# Patient Record
Sex: Male | Born: 1950 | Race: White | Hispanic: No | Smoking: Never smoker
Health system: Southern US, Community
[De-identification: ages and names within clinical notes are randomized; demographics above are authoritative.]

## PROBLEM LIST (undated history)

## (undated) DIAGNOSIS — H269 Unspecified cataract: Secondary | ICD-10-CM

## (undated) DIAGNOSIS — R569 Unspecified convulsions: Secondary | ICD-10-CM

## (undated) DIAGNOSIS — I10 Essential (primary) hypertension: Secondary | ICD-10-CM

## (undated) DIAGNOSIS — I4891 Unspecified atrial fibrillation: Secondary | ICD-10-CM

## (undated) DIAGNOSIS — E119 Type 2 diabetes mellitus without complications: Secondary | ICD-10-CM

## (undated) HISTORY — PX: CATARACT EXTRACTION: SUR2

## (undated) HISTORY — DX: Unspecified convulsions: R56.9

## (undated) HISTORY — PX: VARICOSE VEIN SURGERY: SHX832

## (undated) HISTORY — PX: TONSILLECTOMY: SUR1361

---

## 2012-10-13 ENCOUNTER — Encounter (HOSPITAL_BASED_OUTPATIENT_CLINIC_OR_DEPARTMENT_OTHER): Payer: Self-pay | Admitting: *Deleted

## 2012-10-13 ENCOUNTER — Emergency Department (HOSPITAL_BASED_OUTPATIENT_CLINIC_OR_DEPARTMENT_OTHER)
Admission: EM | Admit: 2012-10-13 | Discharge: 2012-10-13 | Disposition: A | Discharge status: Home / Self Care | Payer: BC Managed Care – PPO | Attending: Emergency Medicine | Admitting: Emergency Medicine

## 2012-10-13 ENCOUNTER — Emergency Department (HOSPITAL_BASED_OUTPATIENT_CLINIC_OR_DEPARTMENT_OTHER): Payer: BC Managed Care – PPO

## 2012-10-13 DIAGNOSIS — I10 Essential (primary) hypertension: Secondary | ICD-10-CM | POA: Insufficient documentation

## 2012-10-13 DIAGNOSIS — R51 Headache: Secondary | ICD-10-CM | POA: Insufficient documentation

## 2012-10-13 DIAGNOSIS — Z8669 Personal history of other diseases of the nervous system and sense organs: Secondary | ICD-10-CM | POA: Insufficient documentation

## 2012-10-13 DIAGNOSIS — R531 Weakness: Secondary | ICD-10-CM

## 2012-10-13 DIAGNOSIS — R42 Dizziness and giddiness: Secondary | ICD-10-CM | POA: Insufficient documentation

## 2012-10-13 DIAGNOSIS — R110 Nausea: Secondary | ICD-10-CM | POA: Insufficient documentation

## 2012-10-13 DIAGNOSIS — Z87891 Personal history of nicotine dependence: Secondary | ICD-10-CM | POA: Insufficient documentation

## 2012-10-13 DIAGNOSIS — R5383 Other fatigue: Secondary | ICD-10-CM | POA: Insufficient documentation

## 2012-10-13 DIAGNOSIS — R5381 Other malaise: Secondary | ICD-10-CM | POA: Insufficient documentation

## 2012-10-13 HISTORY — DX: Essential (primary) hypertension: I10

## 2012-10-13 HISTORY — DX: Unspecified cataract: H26.9

## 2012-10-13 LAB — COMPREHENSIVE METABOLIC PANEL
ALT: 26 U/L (ref 0–53)
AST: 23 U/L (ref 0–37)
Albumin: 3.9 g/dL (ref 3.5–5.2)
Alkaline Phosphatase: 80 U/L (ref 39–117)
Chloride: 101 mEq/L (ref 96–112)
Potassium: 3.9 mEq/L (ref 3.5–5.1)
Sodium: 136 mEq/L (ref 135–145)
Total Bilirubin: 0.3 mg/dL (ref 0.3–1.2)
Total Protein: 6.7 g/dL (ref 6.0–8.3)

## 2012-10-13 LAB — POCT I-STAT 3, ART BLOOD GAS (G3+)
Bicarbonate: 20.3 mEq/L (ref 20.0–24.0)
O2 Saturation: 98 %
TCO2: 21 mmol/L (ref 0–100)
pCO2 arterial: 33 mmHg — ABNORMAL LOW (ref 35.0–45.0)
pH, Arterial: 7.397 (ref 7.350–7.450)
pO2, Arterial: 95 mmHg (ref 80.0–100.0)

## 2012-10-13 LAB — CBC WITH DIFFERENTIAL/PLATELET
Basophils Absolute: 0 10*3/uL (ref 0.0–0.1)
Basophils Relative: 0 % (ref 0–1)
HCT: 46.9 % (ref 39.0–52.0)
Hemoglobin: 16.3 g/dL (ref 13.0–17.0)
Lymphocytes Relative: 8 % — ABNORMAL LOW (ref 12–46)
MCHC: 34.8 g/dL (ref 30.0–36.0)
Neutro Abs: 6.5 10*3/uL (ref 1.7–7.7)
Neutrophils Relative %: 85 % — ABNORMAL HIGH (ref 43–77)
RDW: 12.5 % (ref 11.5–15.5)
WBC: 7.7 10*3/uL (ref 4.0–10.5)

## 2012-10-13 LAB — GLUCOSE, CAPILLARY: Glucose-Capillary: 193 mg/dL — ABNORMAL HIGH (ref 70–99)

## 2012-10-13 LAB — SALICYLATE LEVEL: Salicylate Lvl: <2 mg/dL — ABNORMAL LOW (ref 2.8–20.0)

## 2012-10-13 MED ORDER — ONDANSETRON HCL 4 MG/2ML IJ SOLN
4.0000 mg | Freq: Once | INTRAMUSCULAR | Status: AC
  Administered 2012-10-13: 4 mg via INTRAVENOUS
  Filled 2012-10-13: qty 2

## 2012-10-13 MED ORDER — HYDROMORPHONE HCL PF 1 MG/ML IJ SOLN
1.0000 mg | Freq: Once | INTRAMUSCULAR | Status: AC
  Administered 2012-10-13: 1 mg via INTRAVENOUS
  Filled 2012-10-13: qty 1

## 2012-10-13 NOTE — ED Notes (Signed)
Pt stood with rapid movement to stand at [facility] to uriante-NAD-advised pt that his ability to stand w/o difficulty has improved since arrival

## 2012-10-13 NOTE — ED Provider Notes (Signed)
Medical screening examination/treatment/procedure(s) were conducted as a shared visit with non-physician practitioner(s) and myself.  I personally evaluated the patient during the encounter  Episode of lightheadedness, nausea, gradual onset headache and weakness. Remembers laying down was slow to respond and had blood in mouth. Back to baseline now. EKG nsr.  nonfocal neuro exam. Suspect syncope, possible seizure. 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 Lactate normal, no anion gap. Doubt related.  [place] syncope rules negative. CN 2-12 intact, no ataxia on finger to nose, no nystagmus, 5/5 strength throughout, no pronator drift, Romberg negative, normal gait.   Glynn Octave, MD 10/13/12 [phone]

## 2012-10-13 NOTE — ED Provider Notes (Signed)
CSN: [id number]     Arrival date & time 10/13/12  1201 History     First MD Initiated Contact with Patient 10/13/12 1243     Chief Complaint  Patient presents with  . Weakness   (Consider location/radiation/quality/duration/timing/severity/associated sxs/prior Treatment) Patient is a 62 y.o. male presenting with weakness. The history is provided by the patient. No language interpreter was used.  Weakness This is a new problem. The current episode started today. The problem occurs constantly. The problem has been unchanged. Associated symptoms include headaches, nausea and weakness. Pertinent negatives include no abdominal pain. Nothing aggravates the symptoms. He has tried nothing for the symptoms. The treatment provided no relief.   Pt reports feeling weak and dizzy today.  Pt complains of a headache.   Pt reports some chest soreness.   Pt's friend reports she called to check on him today and he did not answer. Pt reports he had a similar episode 1 year ago Past Medical History  Diagnosis Date  . Hypertension   . Cataract    Past Surgical History  Procedure Laterality Date  . Cataract extraction    . Varicose vein surgery     No family history on file. History  Substance Use Topics  . Smoking status: Former Games developer  . Smokeless tobacco: Never Used  . Alcohol Use: Yes     Comment: occassionally    Review of Systems  Gastrointestinal: Positive for nausea. Negative for abdominal pain.  Neurological: Positive for weakness and headaches.  All other systems reviewed and are negative.    Allergies  Review of patient's allergies indicates no known allergies.  Home Medications  No current outpatient prescriptions on file. BP 151/102  Temp(Src) 98.4 F (36.9 C) (Oral)  Resp 20  Ht 6\' 3"  (1.905 m)  Wt 225 lb (102.059 kg)  BMI 28.12 kg/m2  SpO2 97% Physical Exam  Nursing note and vitals reviewed. Constitutional: He appears well-developed and well-nourished.  HENT:   Head: Normocephalic and atraumatic.  Right Ear: External ear normal.  Left Ear: External ear normal.  Mouth/Throat: Oropharynx is clear and moist.  Eyes: Pupils are equal, round, and reactive to light.  Neck: Normal range of motion.  Cardiovascular: Normal rate and normal heart sounds.   Pulmonary/Chest: Effort normal and breath sounds normal.  Abdominal: Soft.  Musculoskeletal: Normal range of motion.  Neurological: He is alert. He has normal reflexes.  Skin: Skin is warm.  Psychiatric: He has a normal mood and affect.    ED Course   Procedures (including critical care time)  Labs Reviewed  GLUCOSE, CAPILLARY - Abnormal; Notable for the following:    Glucose-Capillary 193 (*)    All other components within normal limits   No results found. 1. Weakness     MDM  Pt does tintype photography,  (uses potasium cyanide)   Dr. Manus Gunning in to see and examine.  I doubt this is related to cyanide.   Pt feels better after fluids and pain medications.   Pt given primary care referrals for follow up as he is new to the ares  Date: 10/13/2012  Rate: 79  Rhythm: normal sinus rhythm  QRS Axis: normal  Intervals: normal  ST/T Wave abnormalities: normal  Conduction Disutrbances:none  Narrative Interpretation:   Old EKG Reviewed: none available Results for orders placed during the hospital encounter of 10/13/12  GLUCOSE, CAPILLARY      Result Value Range   Glucose-Capillary 193 (*) 70 - 99 mg/dL  CBC WITH DIFFERENTIAL  Result Value Range   WBC 7.7  4.0 - 10.5 K/uL   RBC 5.12  4.22 - 5.81 MIL/uL   Hemoglobin 16.3  13.0 - 17.0 g/dL   HCT 16.1  09.6 - 04.5 %   MCV 91.6  78.0 - 100.0 fL   MCH 31.8  26.0 - 34.0 pg   MCHC 34.8  30.0 - 36.0 g/dL   RDW 40.9  81.1 - 91.4 %   Platelets 141 (*) 150 - 400 K/uL   Neutrophils Relative % 85 (*) 43 - 77 %   Neutro Abs 6.5  1.7 - 7.7 K/uL   Lymphocytes Relative 8 (*) 12 - 46 %   Lymphs Abs 0.6 (*) 0.7 - 4.0 K/uL   Monocytes Relative 6  3 -  12 %   Monocytes Absolute 0.5  0.1 - 1.0 K/uL   Eosinophils Relative 0  0 - 5 %   Eosinophils Absolute 0.0  0.0 - 0.7 K/uL   Basophils Relative 0  0 - 1 %   Basophils Absolute 0.0  0.0 - 0.1 K/uL  TROPONIN I      Result Value Range   Troponin I <0.30  <0.30 ng/mL  COMPREHENSIVE METABOLIC PANEL      Result Value Range   Sodium 136  135 - 145 mEq/L   Potassium 3.9  3.5 - 5.1 mEq/L   Chloride 101  96 - 112 mEq/L   CO2 23  19 - 32 mEq/L   Glucose, Bld 171 (*) 70 - 99 mg/dL   BUN 22  6 - 23 mg/dL   Creatinine, Ser 7.82  0.50 - 1.35 mg/dL   Calcium 9.0  8.4 - 95.6 mg/dL   Total Protein 6.7  6.0 - 8.3 g/dL   Albumin 3.9  3.5 - 5.2 g/dL   AST 23  0 - 37 U/L   ALT 26  0 - 53 U/L   Alkaline Phosphatase 80  39 - 117 U/L   Total Bilirubin 0.3  0.3 - 1.2 mg/dL   GFR calc non Af Amer >90  >90 mL/min   GFR calc Af Amer >90  >90 mL/min  SALICYLATE LEVEL      Result Value Range   Salicylate Lvl <2.0 (*) 2.8 - 20.0 mg/dL  ACETAMINOPHEN LEVEL      Result Value Range   Acetaminophen (Tylenol), Serum <15.0  10 - 30 ug/mL  CG4 I-STAT (LACTIC ACID)      Result Value Range   Lactic Acid, Venous 1.98  0.5 - 2.2 mmol/L  POCT I-STAT 3, BLOOD GAS (G3+)      Result Value Range   pH, Arterial 7.397  7.350 - 7.450   pCO2 arterial 33.0 (*) 35.0 - 45.0 mmHg   pO2, Arterial 95.0  80.0 - 100.0 mmHg   Bicarbonate 20.3  20.0 - 24.0 mEq/L   TCO2 21  0 - 100 mmol/L   O2 Saturation 98.0     Acid-base deficit 4.0 (*) 0.0 - 2.0 mmol/L   Patient temperature 98.4 F     Collection site RADIAL, ALLEN'S TEST ACCEPTABLE     Drawn by RT     Sample type ARTERIAL     Dg Chest 2 View  10/13/2012   *RADIOLOGY REPORT*  Clinical Data: Weakness  CHEST - 2 VIEW  Comparison: None  Findings: Heart size is upper normal.  Negative for heart failure. Lungs are clear without  infiltrate effusion or mass.  IMPRESSION: No active cardiopulmonary abnormality.  Original Report Authenticated By: Janeece Riggers, M.D.   Ct Head Wo  Contrast  10/13/2012   *RADIOLOGY REPORT*  Clinical Data: Generalized weakness  CT HEAD WITHOUT CONTRAST  Technique:  Contiguous axial images were obtained from the base of the skull through the vertex without contrast.  Comparison: None  Findings: Mild atrophy.  Negative for acute infarct.  Negative for hemorrhage or mass lesion.  Calvarium intact.  IMPRESSION: No acute abnormality.   Original Report Authenticated By: Janeece Riggers, M.D.     Lonia Skinner [place], PA-C 10/13/12 1602  Elson Areas, PA-C 10/13/12 1605

## 2012-10-13 NOTE — ED Notes (Signed)
ED PA at bedside

## 2012-10-13 NOTE — ED Notes (Signed)
Patient states at approximately 0930 he felt dizzy and weakness and faint.  States he feels nausea at this time.  States he has had a headache for one week.  Past hx of HTN without meds per his PCP.  C/O soreness in his center chest.

## 2012-10-13 NOTE — ED Notes (Signed)
Arterial Blood Gas obtained Right Radial x1 attempt. Bleeding stopped.

## 2012-11-03 ENCOUNTER — Emergency Department (HOSPITAL_COMMUNITY): Payer: BC Managed Care – PPO

## 2012-11-03 ENCOUNTER — Observation Stay (HOSPITAL_COMMUNITY)
Admission: EM | Admit: 2012-11-03 | Discharge: 2012-11-05 | Disposition: A | Discharge status: Home / Self Care | Payer: BC Managed Care – PPO | Attending: Internal Medicine | Admitting: Internal Medicine

## 2012-11-03 ENCOUNTER — Encounter (HOSPITAL_COMMUNITY): Payer: Self-pay | Admitting: Emergency Medicine

## 2012-11-03 DIAGNOSIS — I10 Essential (primary) hypertension: Secondary | ICD-10-CM | POA: Insufficient documentation

## 2012-11-03 DIAGNOSIS — R42 Dizziness and giddiness: Principal | ICD-10-CM | POA: Diagnosis present

## 2012-11-03 DIAGNOSIS — H269 Unspecified cataract: Secondary | ICD-10-CM | POA: Insufficient documentation

## 2012-11-03 DIAGNOSIS — Z9849 Cataract extraction status, unspecified eye: Secondary | ICD-10-CM | POA: Insufficient documentation

## 2012-11-03 DIAGNOSIS — R55 Syncope and collapse: Secondary | ICD-10-CM | POA: Insufficient documentation

## 2012-11-03 DIAGNOSIS — F411 Generalized anxiety disorder: Secondary | ICD-10-CM | POA: Insufficient documentation

## 2012-11-03 DIAGNOSIS — R9401 Abnormal electroencephalogram [EEG]: Secondary | ICD-10-CM

## 2012-11-03 DIAGNOSIS — D696 Thrombocytopenia, unspecified: Secondary | ICD-10-CM | POA: Insufficient documentation

## 2012-11-03 DIAGNOSIS — F419 Anxiety disorder, unspecified: Secondary | ICD-10-CM | POA: Diagnosis present

## 2012-11-03 LAB — COMPREHENSIVE METABOLIC PANEL
ALT: 27 U/L (ref 0–53)
Alkaline Phosphatase: 75 U/L (ref 39–117)
Chloride: 106 mEq/L (ref 96–112)
GFR calc Af Amer: >90 mL/min (ref >90)
Glucose, Bld: 105 mg/dL — ABNORMAL HIGH (ref 70–99)
Potassium: 4.7 mEq/L (ref 3.5–5.1)
Sodium: 140 mEq/L (ref 135–145)
Total Bilirubin: 0.3 mg/dL (ref 0.3–1.2)
Total Protein: 6.7 g/dL (ref 6.0–8.3)

## 2012-11-03 LAB — POCT I-STAT TROPONIN I

## 2012-11-03 LAB — CBC WITH DIFFERENTIAL/PLATELET
Basophils Relative: 0 % (ref 0–1)
Hemoglobin: 16.3 g/dL (ref 13.0–17.0)
Lymphs Abs: 1.4 10*3/uL (ref 0.7–4.0)
MCHC: 36.3 g/dL — ABNORMAL HIGH (ref 30.0–36.0)
Monocytes Relative: 9 % (ref 3–12)
Neutro Abs: 4.1 10*3/uL (ref 1.7–7.7)
Neutrophils Relative %: 65 % (ref 43–77)
RBC: 4.96 MIL/uL (ref 4.22–5.81)

## 2012-11-03 LAB — TROPONIN I: Troponin I: <0.3 ng/mL (ref <0.30)

## 2012-11-03 MED ORDER — POLYETHYLENE GLYCOL 3350 17 G PO PACK
17.0000 g | PACK | Freq: Every day | ORAL | Status: DC | PRN
  Filled 2012-11-03: qty 1

## 2012-11-03 MED ORDER — HYDROCODONE-ACETAMINOPHEN 5-325 MG PO TABS: 1.0000 | ORAL_TABLET | ORAL | Status: DC | PRN

## 2012-11-03 MED ORDER — SODIUM CHLORIDE 0.9 % IJ SOLN
3.0000 mL | Freq: Two times a day (BID) | INTRAMUSCULAR | Status: DC
  Administered 2012-11-03 – 2012-11-04 (×3): 3 mL via INTRAVENOUS

## 2012-11-03 MED ORDER — ASPIRIN 81 MG PO CHEW
81.0000 mg | CHEWABLE_TABLET | Freq: Every day | ORAL | Status: DC
  Administered 2012-11-03 – 2012-11-04 (×2): 81 mg via ORAL
  Filled 2012-11-03 (×2): qty 1

## 2012-11-03 MED ORDER — ASPIRIN 81 MG PO CHEW
324.0000 mg | CHEWABLE_TABLET | Freq: Once | ORAL | Status: AC
  Administered 2012-11-03: 324 mg via ORAL
  Filled 2012-11-03: qty 4

## 2012-11-03 MED ORDER — ADULT MULTIVITAMIN W/MINERALS CH
1.0000 | ORAL_TABLET | Freq: Every day | ORAL | Status: DC
  Administered 2012-11-03 – 2012-11-04 (×2): 1 via ORAL
  Filled 2012-11-03 (×2): qty 1

## 2012-11-03 MED ORDER — GUAIFENESIN-DM 100-10 MG/5ML PO SYRP: 5.0000 mL | ORAL_SOLUTION | ORAL | Status: DC | PRN

## 2012-11-03 MED ORDER — SODIUM CHLORIDE 0.9 % IV SOLN
INTRAVENOUS | Status: AC
  Administered 2012-11-03 – 2012-11-04 (×2): via INTRAVENOUS

## 2012-11-03 MED ORDER — LORAZEPAM 0.5 MG PO TABS: 0.5000 mg | ORAL_TABLET | Freq: Two times a day (BID) | ORAL | Status: DC | PRN

## 2012-11-03 MED ORDER — ALBUTEROL SULFATE (5 MG/ML) 0.5% IN NEBU: 2.5000 mg | INHALATION_SOLUTION | RESPIRATORY_TRACT | Status: DC | PRN

## 2012-11-03 MED ORDER — ONDANSETRON HCL 4 MG PO TABS: 4.0000 mg | ORAL_TABLET | Freq: Four times a day (QID) | ORAL | Status: DC | PRN

## 2012-11-03 MED ORDER — HEPARIN SODIUM (PORCINE) 5000 UNIT/ML IJ SOLN
5000.0000 [IU] | Freq: Three times a day (TID) | INTRAMUSCULAR | Status: DC
  Administered 2012-11-03 – 2012-11-04 (×3): 5000 [IU] via SUBCUTANEOUS
  Filled 2012-11-03 (×5): qty 1

## 2012-11-03 MED ORDER — ONDANSETRON HCL 4 MG/2ML IJ SOLN: 4.0000 mg | Freq: Four times a day (QID) | INTRAMUSCULAR | Status: DC | PRN

## 2012-11-03 NOTE — Progress Notes (Signed)
Pt arrived to room 4e18 from ED.  Pt alert and oriented, VS wnl, and tele placed.  Order placed for stroke swallow screen.  Called rapid response to perform screen, until then pt NPO.  Will continue to monitor.

## 2012-11-03 NOTE — ED Notes (Signed)
Pt friend Deveron Furlong [phone].  Please call if pt needs anything.

## 2012-11-03 NOTE — ED Notes (Signed)
Anitra Lauth, MD at bedside for evaluation.

## 2012-11-03 NOTE — ED Notes (Addendum)
Was seen at dr yesterday for dizziness x 3 days placed on meclizine and ativan had labs drawn  And increased his bp meds had a bunch of tests last week ekg and cat scan and did not find anything

## 2012-11-03 NOTE — ED Provider Notes (Signed)
CSN: [id number]     Arrival date & time 11/03/12  1312 History   First MD Initiated Contact with Patient 11/03/12 1417     Chief Complaint  Patient presents with  . Dizziness  . Hypertension   (Consider location/radiation/quality/duration/timing/severity/associated sxs/prior Treatment) HPI Comments: Patient states for the last 2-3 weeks he's had persistent and intermittent episodes of being lightheaded. It is not associated with position change, exertion. Nothing he can do seems to make it better or worse. He was seen in the emergency room at the end of August for a similar and had normal labs and vitals at that time. He states since that time he followed up with his PCP who scheduled an appointment for him with cardiology on Monday and also gave him Ativan and meclizine for possible anxiety causing his symptoms. He states his blood pressure is going up and down and is poorly regulated and recently started blood pressure medication. He states he occasionally today will feel some numbness in his left jaw but does not involve the face arm or leg. There is no facial droop, speech difficulty or weakness. No prior history of cardiac disease no prior history of stroke. He denies headache at this time, but today has had some soreness in his left shoulder.    The history is provided by the patient.    Past Medical History  Diagnosis Date  . Hypertension   . Cataract    Past Surgical History  Procedure Laterality Date  . Cataract extraction    . Varicose vein surgery     No family history on file. History  Substance Use Topics  . Smoking status: Former Games developer  . Smokeless tobacco: Never Used  . Alcohol Use: Yes     Comment: occassionally    Review of Systems  Constitutional: Negative for fever.  HENT: Negative for neck pain.   Respiratory: Negative for cough and shortness of breath.   Cardiovascular: Negative for chest pain.  Gastrointestinal: Negative for nausea, vomiting and diarrhea.   Neurological: Positive for light-headedness.  All other systems reviewed and are negative.    Allergies  Review of patient's allergies indicates no known allergies.  Home Medications  No current outpatient prescriptions on file. BP 166/85  Pulse 66  Temp(Src) 97.6 F (36.4 C)  Resp 16  SpO2 95% Physical Exam  Nursing note and vitals reviewed. Constitutional: He is oriented to person, place, and time. He appears well-developed and well-nourished. No distress.  HENT:  Head: Normocephalic and atraumatic.  Mouth/Throat: Oropharynx is clear and moist.  Eyes: Conjunctivae and EOM are normal. Pupils are equal, round, and reactive to light.  Neck: Normal range of motion. Neck supple.  Cardiovascular: Normal rate, regular rhythm and intact distal pulses.   Murmur heard.  Systolic murmur is present with a grade of 2/6  Murmur heard best at left sternal border  Pulmonary/Chest: Effort normal and breath sounds normal. No respiratory distress. He has no wheezes. He has no rales.  Abdominal: Soft. He exhibits no distension. There is no tenderness. There is no rebound and no guarding.  Musculoskeletal: Normal range of motion. He exhibits no edema and no tenderness.  Neurological: He is alert and oriented to person, place, and time.  Skin: Skin is warm and dry. No rash noted. No erythema.  Psychiatric: He has a normal mood and affect. His behavior is normal.    ED Course  Procedures (including critical care time) Labs Review Labs Reviewed  COMPREHENSIVE METABOLIC PANEL - Abnormal;  Notable for the following:    Glucose, Bld 105 (*)    All other components within normal limits  CBC WITH DIFFERENTIAL - Abnormal; Notable for the following:    MCHC 36.3 (*)    Platelets 144 (*)    All other components within normal limits  POCT I-STAT TROPONIN I   Imaging Review Dg Chest 2 View  11/03/2012   CLINICAL DATA:  Dizziness, hypertension.  EXAM: CHEST  2 VIEW  COMPARISON:  10/13/2012   FINDINGS: Heart is normal size. No confluent opacities or effusions. No acute bony abnormality.  IMPRESSION: No acute findings.   Electronically Signed   By: Charlett Nose M.D.   On: 11/03/2012 13:56    Date: 11/03/2012  Rate: 70  Rhythm: normal sinus rhythm  QRS Axis: normal  Intervals: normal  ST/T Wave abnormalities: normal  Conduction Disutrbances:none  Narrative Interpretation:   Old EKG Reviewed: unchanged   MDM  No diagnosis found.  Patient is here complaining of persistence sensation of being lightheaded. He states his blood pressure goes up and down but today it became worse and he felt slight numbness in the left side of his mouth. He denies any speech difficulties or focal weakness. However given c/o of mild pain in the left shoulder and facial numbness concern for possible angina.   Patient had one syncopal episode approximately 2 weeks ago and was evaluated in the emergency room negative blood work, negative head CT and a normal EKG and chest x-ray. Agent denies any chest pain, shortness of breath or prior cardiac history. He states he's been under a large amount of stress lately and saw his physician yesterday who thought his symptoms could be related to anxiety and was given Ativan and meclizine. He did take 1 tablet today and feels better than he did prior to arrival.  Patient is well-appearing and states he has a appointment with cardiology on Monday. He has no signs of orthostasis here and lab work is normal but troponin is borderline and shoulder pain today started around lunch. EKG is within normal limits however on exam he does have a faint 2/6 left sternal border murmur. Possible that patient has aortic stenosis which is causing some of his symptoms however also may be related to anxiety vs angina.  Feel pt warrents cardiac workup prior to d/c due to 3rd visit to medical provider for same thing in the last few weeks without problems before.    Gwyneth Sprout, MD 11/03/12  1525

## 2012-11-03 NOTE — H&P (Signed)
[facility]                                                                                    Patient Demographics  Michael Whitney, is a 62 y.o. male  MRN: [id number]   DOB - [birth year]  Admit Date - 11/03/2012  Outpatient Primary MD for the patient is No primary provider on file.   With History of -  Past Medical History  Diagnosis Date  . Hypertension   . Cataract       Past Surgical History  Procedure Laterality Date  . Cataract extraction    . Varicose vein surgery      in for   Chief Complaint  Patient presents with  . Dizziness  . Hypertension     HPI  Michael Whitney  is a 62 y.o. male,  With past medical history of hypertension, cataract, varicose veins comes to the hospital with chief complaints of dizziness, according to the patient he has had episodes of dizziness about 5-6 times over the last several months, few months ago he had one episode when he lost consciousness for a few seconds, he then came to the ER he had a stable workup and was discharged home, in the last few weeks however he has had more frequent episodes of dizziness which he describes as a sinking feeling, denies any symptoms suggestive of vertigo, no headache, he has noticed that his blood pressure has been fluctuating a little bit of late, today he had an episode of dizziness while he was mowing his lawn, he also experienced some tingling and numbness on the left side of his jaw which actually is still present, no focal weakness, no chest pain or palpitations per se. Came to the ER where he had  stable blood work,Unremarkable CT head and EKG and I was called to admit the patient for workup for dizziness possible underlying CAD or Dysrhythmia.    Review of Systems    In addition to the HPI above,   No Fever-chills, No Headache, No changes with Vision or hearing, No problems swallowing food or Liquids, No Chest pain, Cough or Shortness of Breath, No Abdominal pain, No Nausea or Vommitting, Bowel  movements are regular, No Blood in stool or Urine, No dysuria, No new skin rashes or bruises, No new joints pains-aches,  No new weakness, tingling, numbness in any extremity, except as above (jaw) No recent weight gain or loss, No polyuria, polydypsia or polyphagia, No significant Mental Stressors.  A full 10 point Review of Systems was done, except as stated above, all other Review of Systems were negative.   Social History History  Substance Use Topics  . Smoking status: Former Games developer  . Smokeless tobacco: Never Used  . Alcohol Use: Yes     Comment: occassionally      Family History CDA - mother  Prior to Admission medications   Medication Sig Start Date End Date Taking? Authorizing Provider  lisinopril (PRINIVIL,ZESTRIL) 10 MG tablet Take 10 mg by mouth daily.   Yes Historical Provider, MD  LORazepam (ATIVAN) 0.5 MG tablet Take 0.5 mg by mouth 2 (two) times daily as needed for anxiety.  Yes Historical Provider, MD  meclizine (ANTIVERT) 25 MG tablet Take 25 mg by mouth 3 (three) times daily as needed for dizziness.   Yes Historical Provider, MD  Multiple Vitamin (MULTIVITAMIN WITH MINERALS) TABS tablet Take 1 tablet by mouth daily.   Yes Historical Provider, MD    No Known Allergies  Physical Exam  Vitals  Blood pressure 135/79, pulse 74, temperature 97.6 F (36.4 C), resp. rate 16, SpO2 95.00%.   1. General middle aged white male lying in bed in NAD,     2. Anxious affect and insight, Not Suicidal or Homicidal, Awake Alert, Oriented X 3.  3. No F.N deficits, ALL C.Nerves Intact, Strength 5/5 all 4 extremities, Sensation intact all 4 extremities, Plantars down going.  4. Ears and Eyes appear Normal, Conjunctivae clear, PERRLA. Moist Oral Mucosa.  5. Supple Neck, No JVD, No cervical lymphadenopathy appriciated, No Carotid Bruits.  6. Symmetrical Chest wall movement, Good air movement bilaterally, CTAB.  7. RRR, No Gallops, Rubs or Murmurs, No Parasternal  Heave.  8. Positive Bowel Sounds, Abdomen Soft, Non tender, No organomegaly appriciated,No rebound -guarding or rigidity.  9.  No Cyanosis, Normal Skin Turgor, No Skin Rash or Bruise.  10. Good muscle tone,  joints appear normal , no effusions, Normal ROM.  11. No Palpable Lymph Nodes in Neck or Axillae     Data Review  CBC  Recent Labs Lab 11/03/12 1320  WBC 6.3  HGB 16.3  HCT 44.9  PLT 144*  MCV 90.5  MCH 32.9  MCHC 36.3*  RDW 12.6  LYMPHSABS 1.4  MONOABS 0.6  EOSABS 0.2  BASOSABS 0.0   ------------------------------------------------------------------------------------------------------------------  Chemistries   Recent Labs Lab 11/03/12 1320  NA 140  K 4.7  CL 106  CO2 25  GLUCOSE 105*  BUN 19  CREATININE 0.70  CALCIUM 9.4  AST 23  ALT 27  ALKPHOS 75  BILITOT 0.3   ------------------------------------------------------------------------------------------------------------------ CrCl is unknown because both a height and weight (above a minimum accepted value) are required for this calculation. ------------------------------------------------------------------------------------------------------------------ No results found for this basename: TSH, T4TOTAL, FREET3, T3FREE, THYROIDAB,  in the last 72 hours   Coagulation profile No results found for this basename: INR, PROTIME,  in the last 168 hours ------------------------------------------------------------------------------------------------------------------- No results found for this basename: DDIMER,  in the last 72 hours -------------------------------------------------------------------------------------------------------------------  Cardiac Enzymes No results found for this basename: CK, CKMB, TROPONINI, MYOGLOBIN,  in the last 168 hours ------------------------------------------------------------------------------------------------------------------ No components found with this basename:  POCBNP,    ---------------------------------------------------------------------------------------------------------------  Urinalysis No results found for this basename: colorurine, appearanceur, labspec, phurine, glucoseu, hgbur, bilirubinur, ketonesur, proteinur, urobilinogen, nitrite, leukocytesur    ----------------------------------------------------------------------------------------------------------------  Imaging results:   Dg Chest 2 View  11/03/2012   CLINICAL DATA:  Dizziness, hypertension.  EXAM: CHEST  2 VIEW  COMPARISON:  10/13/2012  FINDINGS: Heart is normal size. No confluent opacities or effusions. No acute bony abnormality.  IMPRESSION: No acute findings.   Electronically Signed   By: Charlett Nose M.D.   On: 11/03/2012 13:56   Dg Chest 2 View  10/13/2012   *RADIOLOGY REPORT*  Clinical Data: Weakness  CHEST - 2 VIEW  Comparison: None  Findings: Heart size is upper normal.  Negative for heart failure. Lungs are clear without  infiltrate effusion or mass.  IMPRESSION: No active cardiopulmonary abnormality.   Original Report Authenticated By: Janeece Riggers, M.D.   Ct Head Wo Contrast  10/13/2012   *RADIOLOGY REPORT*  Clinical Data: Generalized weakness  CT  HEAD WITHOUT CONTRAST  Technique:  Contiguous axial images were obtained from the base of the skull through the vertex without contrast.  Comparison: None  Findings: Mild atrophy.  Negative for acute infarct.  Negative for hemorrhage or mass lesion.  Calvarium intact.  IMPRESSION: No acute abnormality.   Original Report Authenticated By: Janeece Riggers, M.D.    My personal review of EKG: Rhythm NSR, Rate  70 /min,  no Acute ST changes    Assessment & Plan  1.Dizziness with left-sided jaw numbness. Patient was somewhat orthostatic in the ER, however he says that he has been consuming large amounts of liquid over the last several weeks, his initial EKG and head CT are unremarkable. At this point I will keep him for 23 hour  observation under telemetry bed, will cycle his troponins, obtain an echogram, will get cardiology input as patient might require further testing for reversible ischemia along with possible prolonged rhythm monitoring for persistent syncopal episodes. For his left-sided jaw tingling workup as above however will also check A1c, lipid panel and MRI of the brain. For now he'll be placed on low-dose aspirin.   2. Mild orthostatic hypotension. Supine blood pressures are stable. He does have history of varicose veins in this could be contributing to his orthostasis along with his home ACE inhibitor, will discontinue ACE inhibitor, gentle IV fluids for hydration and apply TED stockings .   3. Anxiety. Patient will be placed on Klonopin, thereafter outpatient followup with PCP and psychiatry if needed. He's not suicidal homicidal.   DVT Prophylaxis Heparin   AM Labs Ordered, also please review Full Orders  Family Communication: Admission, patients condition and plan of care including tests being ordered have been discussed with the patient and friend who indicate understanding and agree with the plan and Code Status.  Code Status full  Likely DC to  home  Condition fair  Time spent in minutes : 35    Susa Raring K M.D on 11/03/2012 at 3:52 PM  Between 7am to 7pm - Pager - [phone]  After 7pm go to [email or web address] - password [email or web address]  And look for the night coverage person covering me after hours  [facility] Office  [phone]

## 2012-11-03 NOTE — Consult Note (Signed)
HPI: 61 year old male for evaluation of syncopal episode. No prior cardiac history. He typically does not have dyspnea on exertion, orthopnea, PND, pedal edema, palpitations or exertional chest pain. 2 weeks ago he worked outside mowing his yard and trimming. He states it was extremely hot. He went inside and was talking on the phone. He then apparently had a syncopal episode. He does not recall any events leading up to the episode. He denies chest pain, nausea, dyspnea. No incontinence or tongue biting. He awoke when the person who was talking to him was knocking on his door. He was unconscious for 20-30 minutes. He was seen in emergency room and a head CT was negative. Electrocardiogram showed sinus rhythm with no ST changes. Patient did well until 3 days ago when he began experiencing a sensation of dizziness. This was not positional. There is no associated chest pain, palpitations. It was continuous. Today he had an episode of numbness in his left jaw and a sharp pain in his left shoulder. He therefore presented to the emergency room and was admitted. Cardiology is asked to evaluate.  Medications Prior to Admission  Medication Sig Dispense Refill  . lisinopril (PRINIVIL,ZESTRIL) 10 MG tablet Take 10 mg by mouth daily.      Marland Kitchen LORazepam (ATIVAN) 0.5 MG tablet Take 0.5 mg by mouth 2 (two) times daily as needed for anxiety.      . meclizine (ANTIVERT) 25 MG tablet Take 25 mg by mouth 3 (three) times daily as needed for dizziness.      . Multiple Vitamin (MULTIVITAMIN WITH MINERALS) TABS tablet Take 1 tablet by mouth daily.        No Known Allergies  Past Medical History  Diagnosis Date  . Hypertension   . Cataract     Past Surgical History  Procedure Laterality Date  . Cataract extraction    . Varicose vein surgery    . Tonsillectomy      History   Social History  . Marital Status: Single    Spouse Name: N/A    Number of Children: 2  . Years of Education: N/A   Occupational History   .      Photographer   Social History Main Topics  . Smoking status: Former Games developer  . Smokeless tobacco: Never Used  . Alcohol Use: Yes     Comment: occasionally  . Drug Use: No  . Sexual Activity: Not on file   Other Topics Concern  . Not on file   Social History Narrative  . No narrative on file    Family History  Problem Relation Age of Onset  . CAD Mother     MI at age 39    ROS:  no fevers or chills, productive cough, hemoptysis, dysphasia, odynophagia, melena, hematochezia, dysuria, hematuria, rash, seizure activity, orthopnea, PND, pedal edema, claudication. Remaining systems are negative.  Physical Exam:   Blood pressure 147/77, pulse 66, temperature 99.1 F (37.3 C), temperature source Oral, resp. rate 16, height 6\' 3"  (1.905 m), weight 228 lb 6.3 oz (103.6 kg), SpO2 99.00%.  General:  Well developed/well nourished in NAD Skin warm/dry Patient not depressed No peripheral clubbing Back-normal HEENT-normal/normal eyelids Neck supple/normal carotid upstroke bilaterally; no bruits; no JVD; no thyromegaly chest - CTA/ normal expansion CV - RRR/normal S1 and S2; no murmurs or gallops;  PMI nondisplaced, + S4 Abdomen -NT/ND, no HSM, no mass, + bowel sounds, no bruit 2+ femoral pulses, no bruits Ext-no edema, chords, 2+ DP Neuro-grossly nonfocal  ECG sinus rhythm with first degree AV block.  Results for orders placed during the hospital encounter of 11/03/12 (from the past 48 hour(s))  COMPREHENSIVE METABOLIC PANEL     Status: Abnormal   Collection Time    11/03/12  1:20 PM      Result Value Range   Sodium 140  135 - 145 mEq/L   Potassium 4.7  3.5 - 5.1 mEq/L   Chloride 106  96 - 112 mEq/L   CO2 25  19 - 32 mEq/L   Glucose, Bld 105 (*) 70 - 99 mg/dL   BUN 19  6 - 23 mg/dL   Creatinine, Ser 7.82  0.50 - 1.35 mg/dL   Calcium 9.4  8.4 - 95.6 mg/dL   Total Protein 6.7  6.0 - 8.3 g/dL   Albumin 3.8  3.5 - 5.2 g/dL   AST 23  0 - 37 U/L   ALT 27  0 - 53 U/L     Alkaline Phosphatase 75  39 - 117 U/L   Total Bilirubin 0.3  0.3 - 1.2 mg/dL   GFR calc non Af Amer >90  >90 mL/min   GFR calc Af Amer >90  >90 mL/min   Comment: (NOTE)     The eGFR has been calculated using the CKD EPI equation.     This calculation has not been validated in all clinical situations.     eGFR's persistently <90 mL/min signify possible Chronic Kidney     Disease.  CBC WITH DIFFERENTIAL     Status: Abnormal   Collection Time    11/03/12  1:20 PM      Result Value Range   WBC 6.3  4.0 - 10.5 K/uL   RBC 4.96  4.22 - 5.81 MIL/uL   Hemoglobin 16.3  13.0 - 17.0 g/dL   HCT 21.3  08.6 - 57.8 %   MCV 90.5  78.0 - 100.0 fL   MCH 32.9  26.0 - 34.0 pg   MCHC 36.3 (*) 30.0 - 36.0 g/dL   RDW 46.9  62.9 - 52.8 %   Platelets 144 (*) 150 - 400 K/uL   Neutrophils Relative % 65  43 - 77 %   Neutro Abs 4.1  1.7 - 7.7 K/uL   Lymphocytes Relative 22  12 - 46 %   Lymphs Abs 1.4  0.7 - 4.0 K/uL   Monocytes Relative 9  3 - 12 %   Monocytes Absolute 0.6  0.1 - 1.0 K/uL   Eosinophils Relative 3  0 - 5 %   Eosinophils Absolute 0.2  0.0 - 0.7 K/uL   Basophils Relative 0  0 - 1 %   Basophils Absolute 0.0  0.0 - 0.1 K/uL  POCT I-STAT TROPONIN I     Status: None   Collection Time    11/03/12  3:00 PM      Result Value Range   Troponin i, poc 0.06  0.00 - 0.08 ng/mL   Comment 3            Comment: Due to the release kinetics of cTnI,     a negative result within the first hours     of the onset of symptoms does not rule out     myocardial infarction with certainty.     If myocardial infarction is still suspected,     repeat the test at appropriate intervals.  TROPONIN I     Status: None   Collection Time    11/03/12  3:32 PM      Result Value Range   Troponin I <0.30  <0.30 ng/mL   Comment:            Due to the release kinetics of cTnI,     a negative result within the first hours     of the onset of symptoms does not rule out     myocardial infarction with certainty.     If  myocardial infarction is still suspected,     repeat the test at appropriate intervals.    Dg Chest 2 View  11/03/2012   CLINICAL DATA:  Dizziness, hypertension.  EXAM: CHEST  2 VIEW  COMPARISON:  10/13/2012  FINDINGS: Heart is normal size. No confluent opacities or effusions. No acute bony abnormality.  IMPRESSION: No acute findings.   Electronically Signed   By: Charlett Nose M.D.   On: 11/03/2012 13:56    Assessment/Plan 1 syncopal episode-etiology unclear. Event does not sound cardiac as he was unconscious for 20-30 minutes. His electrocardiogram shows sinus rhythm with no ST changes and his QT is not prolonged. Question neurological event. Watch on telemetry. Check echocardiogram for LV function tomorrow morning. If LV function normal and no significant arrhythmias he could be discharged with outpatient stress test. He may need a neurological evaluation but I will leave this to her primary care. 2 dizziness-this also does not appear to be cardiac. It has been continuous for 3 days. 3 hypertension-continue preadmission medications and adjust as needed.  Olga Millers MD 11/03/2012, 7:00 PM

## 2012-11-04 ENCOUNTER — Observation Stay (HOSPITAL_COMMUNITY): Payer: BC Managed Care – PPO

## 2012-11-04 DIAGNOSIS — I369 Nonrheumatic tricuspid valve disorder, unspecified: Secondary | ICD-10-CM

## 2012-11-04 LAB — BASIC METABOLIC PANEL
CO2: 24 mEq/L (ref 19–32)
GFR calc non Af Amer: >90 mL/min (ref >90)
Glucose, Bld: 98 mg/dL (ref 70–99)
Potassium: 4.3 mEq/L (ref 3.5–5.1)
Sodium: 137 mEq/L (ref 135–145)

## 2012-11-04 LAB — CBC
Hemoglobin: 16.1 g/dL (ref 13.0–17.0)
MCH: 32.4 pg (ref 26.0–34.0)
MCV: 90.3 fL (ref 78.0–100.0)
RBC: 4.97 MIL/uL (ref 4.22–5.81)

## 2012-11-04 LAB — LIPID PANEL: Cholesterol: 111 mg/dL (ref 0–200)

## 2012-11-04 LAB — HEMOGLOBIN A1C: Hgb A1c MFr Bld: 6.1 % — ABNORMAL HIGH (ref <5.7)

## 2012-11-04 MED ORDER — FLUOXETINE HCL 20 MG PO CAPS
20.0000 mg | ORAL_CAPSULE | Freq: Every day | ORAL | Status: DC
  Filled 2012-11-04 (×2): qty 1

## 2012-11-04 MED ORDER — ENOXAPARIN SODIUM 60 MG/0.6ML ~~LOC~~ SOLN
50.0000 mg | SUBCUTANEOUS | Status: DC
  Administered 2012-11-04: 50 mg via SUBCUTANEOUS
  Filled 2012-11-04 (×2): qty 0.6

## 2012-11-04 NOTE — Progress Notes (Signed)
Utilization Review Completed.   Aimy Sweeting, RN, BSN Nurse Case Manager  [phone]  

## 2012-11-04 NOTE — Progress Notes (Signed)
  Echocardiogram 2D Echocardiogram has been performed.  Cathie Beams 11/04/2012, 2:36 PM

## 2012-11-04 NOTE — Progress Notes (Signed)
Pharmacy consulted to dose lovenox for VTE prophylaxis. Given BMI > 30, will dose 0.5mg /kg q24. Weight = 104.6kg. Renal function wnl.  Plan: 1) Lovenox 50mg  sq q24 2) Pharmacy will sign off, please re-consult if needed.  Louie Casa, PharmD, BCPS 11/04/12, 7:39 PM

## 2012-11-04 NOTE — Progress Notes (Signed)
[facility] PROGRESS NOTE  Michael Whitney ZOX:[id number] DOB: 1951-01-04 DOA: 11/03/2012 PCP: Londell Moh, MD  Assessment/Plan  Dizziness, lightheadedness, syncope and left-sided jaw numbness.  Had possibly a post-ictal phase.  His story is not typical.  Blood pressures elevated so adrenal insufficiency less likely.   -  MRI neg for stroke  -  Carotids neg -  ECHO pending -  Telemetry without significant arrhythmia -  Check EEG to rule out partial complex seizures -  Consider vestibular rehab outpatient  Mild orthostatic hypotension.  Hydrated.  Anxiety,  -  D/c benzos -  Start prozac  HTN, blood pressure mildly elevated -  Continue lisinopril  Diet:  Low salt Access:  PIV IVF:  OFF Proph:  lovenox  Code Status: full Family Communication: patient alone Disposition Plan: likely tomorrow pending EEG   Consultants:  Cardiology  Procedures:  MRI/MRA  Carotid duplex  ECHO  EEG   Antibiotics:  none   HPI/Subjective:  Feels well.  Denies LH/dizziness currently.  Denies chest pain or pressure or palpitations.    Objective: Filed Vitals:   11/04/12 0624 11/04/12 0626 11/04/12 0629 11/04/12 1538  BP: 132/74 136/72 124/77 156/85  Pulse: 63 67 69 69  Temp:    97 F (36.1 C)  TempSrc:      Resp:    18  Height:      Weight:      SpO2:    95%    Intake/Output Summary (Last 24 hours) at 11/04/12 1925 Last data filed at 11/04/12 1645  Gross per 24 hour  Intake    243 ml  Output   3100 ml  Net  -2857 ml   Filed Weights   11/03/12 1706 11/04/12 0622  Weight: 103.6 kg (228 lb 6.3 oz) 104.554 kg (230 lb 8 oz)    Exam:   General:  Tall CM, No acute distress  HEENT:  NCAT, MMM  Cardiovascular:  RRR, nl S1, S2 no mrg, 2+ pulses, warm extremities  Respiratory:  CTAB, no increased WOB  Abdomen:   NABS, soft, NT/ND  MSK:   Normal tone and bulk, no LEE  Neuro:  Grossly intact  Data Reviewed: Basic Metabolic Panel:  Recent  Labs Lab 11/03/12 1320 11/04/12 1125  NA 140 137  K 4.7 4.3  CL 106 103  CO2 25 24  GLUCOSE 105* 98  BUN 19 17  CREATININE 0.70 0.66  CALCIUM 9.4 9.1   Liver Function Tests:  Recent Labs Lab 11/03/12 1320  AST 23  ALT 27  ALKPHOS 75  BILITOT 0.3  PROT 6.7  ALBUMIN 3.8   No results found for this basename: LIPASE, AMYLASE,  in the last 168 hours No results found for this basename: AMMONIA,  in the last 168 hours CBC:  Recent Labs Lab 11/03/12 1320 11/04/12 1125  WBC 6.3 5.3  NEUTROABS 4.1  --   HGB 16.3 16.1  HCT 44.9 44.9  MCV 90.5 90.3  PLT 144* 140*   Cardiac Enzymes:  Recent Labs Lab 11/03/12 1532 11/03/12 2206 11/04/12 1125  TROPONINI <0.30 <0.30 <0.30   BNP (last 3 results) No results found for this basename: PROBNP,  in the last 8760 hours CBG: No results found for this basename: GLUCAP,  in the last 168 hours  No results found for this or any previous visit (from the past 240 hour(s)).   Studies: Dg Chest 2 View  11/03/2012   CLINICAL DATA:  Dizziness, hypertension.  EXAM: CHEST  2  VIEW  COMPARISON:  10/13/2012  FINDINGS: Heart is normal size. No confluent opacities or effusions. No acute bony abnormality.  IMPRESSION: No acute findings.   Electronically Signed   By: Charlett Nose M.D.   On: 11/03/2012 13:56   Mr Brain Wo Contrast  11/04/2012   *RADIOLOGY REPORT*  Clinical Data:  Left facial numbness.  Dizziness.  MRI HEAD WITHOUT CONTRAST MRA HEAD WITHOUT CONTRAST  Technique:  Multiplanar, multiecho pulse sequences of the brain and surrounding structures were obtained without intravenous contrast. Angiographic images of the head were obtained using MRA technique without contrast.  Comparison:  Head CT 10/13/2012  MRI HEAD  Findings:  The brain has a normal appearance on all pulse sequences without evidence of accelerated atrophy, old or acute infarction, mass lesion, hemorrhage, hydrocephalus or extra-axial collection. No pituitary mass.  No  inflammatory sinus disease.  No skull or skull base lesion.  Temporal bone study was not ordered or performed, but no abnormality is seen in the CP angle regions or internal auditory canals.  No fluid in the mastoid air cells.  IMPRESSION: Normal examination.  No cause of the described symptoms is identified.  MRA HEAD  Findings: Both internal carotid arteries are widely patent into the brain.  The anterior and middle cerebral vessels are normal without proximal stenosis, aneurysm or vascular malformation.  Both vertebral arteries are widely patent to the basilar.  No basilar stenosis.  Posterior circulation branch vessels are normal.  IMPRESSION: Normal intracranial MR angiography of the large and medium-sized vessels.   Original Report Authenticated By: Paulina Fusi, M.D.   Mr Mra Head/brain Wo Cm  11/04/2012   *RADIOLOGY REPORT*  Clinical Data:  Left facial numbness.  Dizziness.  MRI HEAD WITHOUT CONTRAST MRA HEAD WITHOUT CONTRAST  Technique:  Multiplanar, multiecho pulse sequences of the brain and surrounding structures were obtained without intravenous contrast. Angiographic images of the head were obtained using MRA technique without contrast.  Comparison:  Head CT 10/13/2012  MRI HEAD  Findings:  The brain has a normal appearance on all pulse sequences without evidence of accelerated atrophy, old or acute infarction, mass lesion, hemorrhage, hydrocephalus or extra-axial collection. No pituitary mass.  No inflammatory sinus disease.  No skull or skull base lesion.  Temporal bone study was not ordered or performed, but no abnormality is seen in the CP angle regions or internal auditory canals.  No fluid in the mastoid air cells.  IMPRESSION: Normal examination.  No cause of the described symptoms is identified.  MRA HEAD  Findings: Both internal carotid arteries are widely patent into the brain.  The anterior and middle cerebral vessels are normal without proximal stenosis, aneurysm or vascular malformation.   Both vertebral arteries are widely patent to the basilar.  No basilar stenosis.  Posterior circulation branch vessels are normal.  IMPRESSION: Normal intracranial MR angiography of the large and medium-sized vessels.   Original Report Authenticated By: Paulina Fusi, M.D.    Scheduled Meds: . aspirin  81 mg Oral Daily  . heparin  5,000 Units Subcutaneous Q8H  . multivitamin with minerals  1 tablet Oral Daily  . sodium chloride  3 mL Intravenous Q12H   Continuous Infusions:   Principal Problem:   Dizziness Active Problems:   Hypertension   Cataract   Anxiety    Time spent: 30 min    Fernado Brigante, [facility]  [facility] Pager [phone]. If 7PM-7AM, please contact night-coverage at [email or web address], password [facility] 11/04/2012, 7:25 PM  LOS: 1 day

## 2012-11-04 NOTE — Progress Notes (Signed)
Patient alarmed tachy, monitor showed heart rate briefly elevated to 144, appeared to be artifact.  Patient off floor at the time.  Monitor returned to sinus rhythm after, will continue to monitor.

## 2012-11-04 NOTE — Progress Notes (Signed)
*  PRELIMINARY RESULTS* Vascular Ultrasound Carotid Duplex (Doppler) has been completed.  Findings suggest 1-39% internal carotid artery stenosis bilaterally. Vertebral arteries are patent with antegrade flow.  11/04/2012 4:38 PM Gertie Fey, RVT, RDCS, RDMS

## 2012-11-04 NOTE — Progress Notes (Signed)
Subjective:  Dizziness has improved. Left jaw pain has improved. Just returned from MRI results normal.  Objective:  Vital Signs in the last 24 hours: Temp:  [97.2 F (36.2 C)-99.1 F (37.3 C)] 97.2 F (36.2 C) (09/12 0622) Pulse Rate:  [60-78] 69 (09/12 0629) Resp:  [16-18] 18 (09/12 0622) BP: (121-166)/(67-95) 124/77 mmHg (09/12 0629) SpO2:  [92 %-99 %] 96 % (09/12 0622) Weight:  [228 lb 6.3 oz (103.6 kg)-230 lb 8 oz (104.554 kg)] 230 lb 8 oz (104.554 kg) (09/12 0622)  Intake/Output from previous day: 09/11 0701 - 09/12 0700 In: 243 [P.O.:240; I.V.:3] Out: 775 [Urine:775] Intake/Output from this shift:    . aspirin  81 mg Oral Daily  . heparin  5,000 Units Subcutaneous Q8H  . multivitamin with minerals  1 tablet Oral Daily  . sodium chloride  3 mL Intravenous Q12H   . sodium chloride 75 mL/hr at 11/03/12 1751    Physical Exam: The patient appears to be in no distress.  Head and neck exam reveals that the pupils are equal and reactive.  The extraocular movements are full.  There is no scleral icterus.  Mouth and pharynx are benign.  No lymphadenopathy.  No carotid bruits.  The jugular venous pressure is normal.  Thyroid is not enlarged or tender.  Chest is clear to percussion and auscultation.  No rales or rhonchi.  Expansion of the chest is symmetrical.  Heart reveals no abnormal lift or heave.  First and second heart sounds are normal.  There is no murmur gallop rub or click.  The abdomen is soft and nontender.  Bowel sounds are normoactive.  There is no hepatosplenomegaly or mass.  There are no abdominal bruits.  Extremities reveal no phlebitis or edema.  Pedal pulses are good.  There is no cyanosis or clubbing.  Neurologic exam is normal strength and no lateralizing weakness.  No sensory deficits.  Integument reveals no rash  Lab Results:  Recent Labs  11/03/12 1320  WBC 6.3  HGB 16.3  PLT 144*    Recent Labs  11/03/12 1320  NA 140  K 4.7  CL  106  CO2 25  GLUCOSE 105*  BUN 19  CREATININE 0.70    Recent Labs  11/03/12 1532 11/03/12 2206  TROPONINI <0.30 <0.30   Hepatic Function Panel  Recent Labs  11/03/12 1320  PROT 6.7  ALBUMIN 3.8  AST 23  ALT 27  ALKPHOS 75  BILITOT 0.3   No results found for this basename: CHOL,  in the last 72 hours No results found for this basename: PROTIME,  in the last 72 hours  Imaging: Dg Chest 2 View  11/03/2012   CLINICAL DATA:  Dizziness, hypertension.  EXAM: CHEST  2 VIEW  COMPARISON:  10/13/2012  FINDINGS: Heart is normal size. No confluent opacities or effusions. No acute bony abnormality.  IMPRESSION: No acute findings.   Electronically Signed   By: Charlett Nose M.D.   On: 11/03/2012 13:56   Mr Brain Wo Contrast  11/04/2012   *RADIOLOGY REPORT*  Clinical Data:  Left facial numbness.  Dizziness.  MRI HEAD WITHOUT CONTRAST MRA HEAD WITHOUT CONTRAST  Technique:  Multiplanar, multiecho pulse sequences of the brain and surrounding structures were obtained without intravenous contrast. Angiographic images of the head were obtained using MRA technique without contrast.  Comparison:  Head CT 10/13/2012  MRI HEAD  Findings:  The brain has a normal appearance on all pulse sequences without evidence of accelerated atrophy, old  or acute infarction, mass lesion, hemorrhage, hydrocephalus or extra-axial collection. No pituitary mass.  No inflammatory sinus disease.  No skull or skull base lesion.  Temporal bone study was not ordered or performed, but no abnormality is seen in the CP angle regions or internal auditory canals.  No fluid in the mastoid air cells.  IMPRESSION: Normal examination.  No cause of the described symptoms is identified.  MRA HEAD  Findings: Both internal carotid arteries are widely patent into the brain.  The anterior and middle cerebral vessels are normal without proximal stenosis, aneurysm or vascular malformation.  Both vertebral arteries are widely patent to the basilar.  No  basilar stenosis.  Posterior circulation branch vessels are normal.  IMPRESSION: Normal intracranial MR angiography of the large and medium-sized vessels.   Original Report Authenticated By: Paulina Fusi, M.D.   Mr Mra Head/brain Wo Cm  11/04/2012   *RADIOLOGY REPORT*  Clinical Data:  Left facial numbness.  Dizziness.  MRI HEAD WITHOUT CONTRAST MRA HEAD WITHOUT CONTRAST  Technique:  Multiplanar, multiecho pulse sequences of the brain and surrounding structures were obtained without intravenous contrast. Angiographic images of the head were obtained using MRA technique without contrast.  Comparison:  Head CT 10/13/2012  MRI HEAD  Findings:  The brain has a normal appearance on all pulse sequences without evidence of accelerated atrophy, old or acute infarction, mass lesion, hemorrhage, hydrocephalus or extra-axial collection. No pituitary mass.  No inflammatory sinus disease.  No skull or skull base lesion.  Temporal bone study was not ordered or performed, but no abnormality is seen in the CP angle regions or internal auditory canals.  No fluid in the mastoid air cells.  IMPRESSION: Normal examination.  No cause of the described symptoms is identified.  MRA HEAD  Findings: Both internal carotid arteries are widely patent into the brain.  The anterior and middle cerebral vessels are normal without proximal stenosis, aneurysm or vascular malformation.  Both vertebral arteries are widely patent to the basilar.  No basilar stenosis.  Posterior circulation branch vessels are normal.  IMPRESSION: Normal intracranial MR angiography of the large and medium-sized vessels.   Original Report Authenticated By: Paulina Fusi, M.D.    Cardiac Studies: Telemetry NSR. Assessment/Plan:  1 syncopal episode-etiology unclear. Event does not sound cardiac. Watch on telemetry. Check echocardiogram.   If LV function normal and no significant arrhythmias he could be discharged with outpatient stress test. He may need a neurological  evaluation but I will leave this to her primary care.  2 dizziness-improving. 3 hypertension-continue preadmission medications and adjust as needed.   LOS: 1 day    Cassell Clement 11/04/2012, 8:25 AM

## 2012-11-05 ENCOUNTER — Observation Stay (HOSPITAL_COMMUNITY): Payer: BC Managed Care – PPO

## 2012-11-05 DIAGNOSIS — R9401 Abnormal electroencephalogram [EEG]: Secondary | ICD-10-CM

## 2012-11-05 DIAGNOSIS — R55 Syncope and collapse: Secondary | ICD-10-CM

## 2012-11-05 DIAGNOSIS — D696 Thrombocytopenia, unspecified: Secondary | ICD-10-CM

## 2012-11-05 MED ORDER — LEVETIRACETAM 500 MG PO TABS: 500.0000 mg | ORAL_TABLET | Freq: Two times a day (BID) | ORAL | Status: DC

## 2012-11-05 MED ORDER — LEVETIRACETAM 500 MG PO TABS
500.0000 mg | ORAL_TABLET | Freq: Two times a day (BID) | ORAL | Status: DC
  Administered 2012-11-05: 500 mg via ORAL
  Filled 2012-11-05 (×2): qty 1

## 2012-11-05 MED ORDER — FLUOXETINE HCL 20 MG PO CAPS: 20.0000 mg | ORAL_CAPSULE | Freq: Every day | ORAL | Status: DC

## 2012-11-05 MED ORDER — ASPIRIN 81 MG PO CHEW: 81.0000 mg | CHEWABLE_TABLET | Freq: Every day | ORAL | Status: DC

## 2012-11-05 NOTE — Procedures (Signed)
EEG report.  Brief clinical history:  62 years old male who sustained an isolated episode of brief alteration of consciousness. Differential diagnosis is syncope versus seizures. No prior history of frank epileptic seizures.  Technique: this is a 17 channel routine scalp EEG performed at the bedside with bipolar and monopolar montages arranged in accordance to the international 10/20 system of electrode placement. One channel was dedicated to EKG recording.  No sleep was achieved. Hyperventilation and intermittent photic stimulation were utilized as activating procedures.  Description:In the wakeful state, the best background consisted of a medium amplitude, posterior dominant, well sustained, symmetric and reactive 11 Hz rhythm. Hyperventilation induced mild, diffuse, physiologic slowing but no epileptiform discharges. Intermittent photic stimulation did induce a normal driving response.  Frequent, intermittent, at times sharply contoured right temporal theta slowing was noted.  No frank focal or generalized epileptiform discharges noted.  EKG showed sinus rhythm.  Impression: this is an abnormal awake EEG with findings suggestive of focal neuronal dysfunction involving the right temporal region. If clinically indicated, a repeat EEG with sleep deprivation or an ambulatory 24 hour EEG could provide further information regarding patient recent episode of transient alteration of consciousness.  Clinical correlation is advised.    Wyatt Portela, MD [facility].

## 2012-11-05 NOTE — Discharge Summary (Signed)
Physician Discharge Summary  Michael Whitney WUJ:[id number] DOB: [birth year] DOA: 11/03/2012  PCP: Londell Moh, MD  Admit date: 11/03/2012 Discharge date: 11/05/2012  Recommendations for Outpatient Follow-up:  1. Follow up with Neurology within 2-4 weeks of discharge for 24 hour EEG.   2. Started keppra 500mg  BID, lisinopril and prozac.   3. Followup with primary care doctor within one to 2 weeks of discharge for blood pressure check, repeat CBC to ensure the platelet count has recovered.  Please assist with referral to neurology if patient having difficulties.  Follow up anxiety.    Repeat A1c in 01/2013.    Discharge Diagnoses:  Principal Problem:   Dizziness Active Problems:   Hypertension   Cataract   Anxiety   Abnormal EEG   Syncope   Thrombocytopenia, unspecified   Discharge Condition: Stable, improved  Diet recommendation: Healthy heart  Wt Readings from Last 3 Encounters:  11/05/12 103.783 kg (228 lb 12.8 oz)  10/13/12 102.059 kg (225 lb)    History of present illness:  Jahrel Borthwick is a 62 y.o. male, With past medical history of hypertension, cataract, varicose veins comes to the hospital with chief complaints of dizziness, according to the patient he has had episodes of dizziness about 5-6 times over the last several months, few months ago he had one episode when he lost consciousness for a few seconds, he then came to the ER he had a stable workup and was discharged home, in the last few weeks however he has had more frequent episodes of dizziness which he describes as a sinking feeling, denies any symptoms suggestive of vertigo, no headache, he has noticed that his blood pressure has been fluctuating a little bit of late, today he had an episode of dizziness while he was mowing his lawn, he also experienced some tingling and numbness on the left side of his jaw which actually is still present, no focal weakness, no chest pain or palpitations per se. Came to the ER  where he had stable blood work,Unremarkable CT head and EKG and I was called to admit the patient for workup for dizziness possible underlying CAD or Dysrhythmia.  Hospital Course:   The patient had an episode of dizziness, lightheadedness, and syncope, left-sided jaw numbness. He had full loss of consciousness for at least 30-45 minutes and had a possible post ictal phase. History was not typical for simple syncope. His blood pressures were elevated.  His MRI was negative for stroke, carotid duplex demonstrated 1-39% internal carotid artery stenosis bilaterally and antegrade vertebral artery flow.  His echocardiogram demonstrated mild LVH, normal systolic function with ejection fraction of 55-60%, normal valves.  Telemetry demonstrated no significant arrhythmias. The EEG demonstrated focal neuronal dysfunction of the right temporal region.  See full report below.  In conversation with the neurologist, the patient was started on Keppra 500 mg twice daily and with recommended to have outpatient followup with neurology for a 24-hour EEG.  Mild orthostatic hypotension.  Given IVF and resolved.    Anxiety, started prozac.  HTN, blood pressures mildly elevated but may be due to stress from hospitalization.  Repeat in 1-2 weeks.  Continue lisinopril.    Mildly elevated A1c 6.1.  Counseled diet and exercise.  Repeat in 3 months.    Consultants:  Cardiology Procedures:  MRI/MRA  Carotid duplex  ECHO  EEG  Antibiotics:  none   Discharge Exam: Filed Vitals:   11/05/12 0627  BP: 137/79  Pulse: 61  Temp: 98.2 F (36.8 C)  Resp: 18   Filed Vitals:   11/04/12 0629 11/04/12 1538 11/04/12 2051 11/05/12 0627  BP: 124/77 156/85 142/63 137/79  Pulse: 69 69 77 61  Temp:  97 F (36.1 C) 97.8 F (36.6 C) 98.2 F (36.8 C)  TempSrc:   Oral Oral  Resp:  18 18 18   Height:      Weight:    103.783 kg (228 lb 12.8 oz)  SpO2:  95% 96% 97%   States he feels well and is ready to go home.    General:  Tall CM, No acute distress  HEENT: NCAT, MMM  Cardiovascular: RRR, nl S1, S2 no mrg, 2+ pulses, warm extremities  Respiratory: CTAB, no increased WOB  Abdomen: NABS, soft, NT/ND  MSK: Normal tone and bulk, no LEE  Neuro: Grossly intact   Discharge Instructions      Discharge Orders   Future Appointments Provider Department Dept Phone   11/07/2012 11:15 AM Thurmon Fair, MD [facility] [place] [phone]   Future Orders Complete By Expires   Call MD for:  difficulty breathing, headache or visual disturbances  As directed    Call MD for:  extreme fatigue  As directed    Call MD for:  hives  As directed    Call MD for:  persistant dizziness or light-headedness  As directed    Call MD for:  persistant nausea and vomiting  As directed    Call MD for:  severe uncontrolled pain  As directed    Call MD for:  temperature >100.4  As directed    Diet - low sodium heart healthy  As directed    Discharge instructions  As directed    Comments:     You were hospitalized with an episode of dizziness with loss of consciousness.  Your MRI was normal, test of blood flow to the brain was also normal. The ultrasound of your heart showed mild thickening of the walls of the heart normal function.  You had an EEG, a test for seizure, which was abnormal. The neurologist recommended starting a seizure medication called Keppra which is taken twice daily. Please start this medication and have your primary care Doctor give you a referral to neurology. He will need a 24 hour EEG monitoring test.  You also had some anxiety so I have given you a prescription for prozac.  This medication takes approximately 4-6 weeks to work and the dose will need to be increased by your primary care doctor. Stay hydrated. In the state of [place] it is against the law to operate a motor vehicle or babysit if you have had a recent seizure. Additionally we recommend not swimming or climbing anything  higher than shoulder height to avoid injury.  Please do not do these activities until you are told it is safe by a physician.   Driving Restrictions  As directed    Comments:     No driving, babysitting, climbing, or swimming or bathing in a tub of water until told it is safe by a primary care doctor.   Increase activity slowly  As directed        Medication List         FLUoxetine 20 MG capsule  Commonly known as:  PROZAC  Take 1 capsule (20 mg total) by mouth daily.     levETIRAcetam 500 MG tablet  Commonly known as:  KEPPRA  Take 1 tablet (500 mg total) by mouth 2 (two) times daily.  lisinopril 10 MG tablet  Commonly known as:  PRINIVIL,ZESTRIL  Take 10 mg by mouth daily.     LORazepam 0.5 MG tablet  Commonly known as:  ATIVAN  Take 0.5 mg by mouth 2 (two) times daily as needed for anxiety.     meclizine 25 MG tablet  Commonly known as:  ANTIVERT  Take 25 mg by mouth 3 (three) times daily as needed for dizziness.     multivitamin with minerals Tabs tablet  Take 1 tablet by mouth daily.       Follow-up Information   Follow up with Londell Moh, MD. Schedule an appointment as soon as possible for a visit in 2 weeks.   Specialty:  Internal Medicine   Contact information:   [address] [place] [address] [phone]       Follow up with Neurology.       The results of significant diagnostics from this hospitalization (including imaging, microbiology, ancillary and laboratory) are listed below for reference.    Significant Diagnostic Studies: Dg Chest 2 View  11/03/2012   CLINICAL DATA:  Dizziness, hypertension.  EXAM: CHEST  2 VIEW  COMPARISON:  10/13/2012  FINDINGS: Heart is normal size. No confluent opacities or effusions. No acute bony abnormality.  IMPRESSION: No acute findings.   Electronically Signed   By: Charlett Nose M.D.   On: 11/03/2012 13:56   Dg Chest 2 View  10/13/2012   *RADIOLOGY REPORT*  Clinical Data: Weakness   CHEST - 2 VIEW  Comparison: None  Findings: Heart size is upper normal.  Negative for heart failure. Lungs are clear without  infiltrate effusion or mass.  IMPRESSION: No active cardiopulmonary abnormality.   Original Report Authenticated By: Janeece Riggers, M.D.   Ct Head Wo Contrast  10/13/2012   *RADIOLOGY REPORT*  Clinical Data: Generalized weakness  CT HEAD WITHOUT CONTRAST  Technique:  Contiguous axial images were obtained from the base of the skull through the vertex without contrast.  Comparison: None  Findings: Mild atrophy.  Negative for acute infarct.  Negative for hemorrhage or mass lesion.  Calvarium intact.  IMPRESSION: No acute abnormality.   Original Report Authenticated By: Janeece Riggers, M.D.   Mr Brain Wo Contrast  11/04/2012   *RADIOLOGY REPORT*  Clinical Data:  Left facial numbness.  Dizziness.  MRI HEAD WITHOUT CONTRAST MRA HEAD WITHOUT CONTRAST  Technique:  Multiplanar, multiecho pulse sequences of the brain and surrounding structures were obtained without intravenous contrast. Angiographic images of the head were obtained using MRA technique without contrast.  Comparison:  Head CT 10/13/2012  MRI HEAD  Findings:  The brain has a normal appearance on all pulse sequences without evidence of accelerated atrophy, old or acute infarction, mass lesion, hemorrhage, hydrocephalus or extra-axial collection. No pituitary mass.  No inflammatory sinus disease.  No skull or skull base lesion.  Temporal bone study was not ordered or performed, but no abnormality is seen in the CP angle regions or internal auditory canals.  No fluid in the mastoid air cells.  IMPRESSION: Normal examination.  No cause of the described symptoms is identified.  MRA HEAD  Findings: Both internal carotid arteries are widely patent into the brain.  The anterior and middle cerebral vessels are normal without proximal stenosis, aneurysm or vascular malformation.  Both vertebral arteries are widely patent to the basilar.  No  basilar stenosis.  Posterior circulation branch vessels are normal.  IMPRESSION: Normal intracranial MR angiography of the large and medium-sized vessels.  Original Report Authenticated By: Paulina Fusi, M.D.   Mr Mra Head/brain Wo Cm  11/04/2012   *RADIOLOGY REPORT*  Clinical Data:  Left facial numbness.  Dizziness.  MRI HEAD WITHOUT CONTRAST MRA HEAD WITHOUT CONTRAST  Technique:  Multiplanar, multiecho pulse sequences of the brain and surrounding structures were obtained without intravenous contrast. Angiographic images of the head were obtained using MRA technique without contrast.  Comparison:  Head CT 10/13/2012  MRI HEAD  Findings:  The brain has a normal appearance on all pulse sequences without evidence of accelerated atrophy, old or acute infarction, mass lesion, hemorrhage, hydrocephalus or extra-axial collection. No pituitary mass.  No inflammatory sinus disease.  No skull or skull base lesion.  Temporal bone study was not ordered or performed, but no abnormality is seen in the CP angle regions or internal auditory canals.  No fluid in the mastoid air cells.  IMPRESSION: Normal examination.  No cause of the described symptoms is identified.  MRA HEAD  Findings: Both internal carotid arteries are widely patent into the brain.  The anterior and middle cerebral vessels are normal without proximal stenosis, aneurysm or vascular malformation.  Both vertebral arteries are widely patent to the basilar.  No basilar stenosis.  Posterior circulation branch vessels are normal.  IMPRESSION: Normal intracranial MR angiography of the large and medium-sized vessels.   Original Report Authenticated By: Paulina Fusi, M.D.   Copy of EEG report: "EEG report.  Brief clinical history: 62 years old male who sustained an isolated episode of brief alteration of consciousness. Differential diagnosis is syncope versus seizures. No prior history of frank epileptic seizures.  Technique: this is a 17 channel routine scalp EEG  performed at the bedside with bipolar and monopolar montages arranged in accordance to the international 10/20 system of electrode placement. One channel was dedicated to EKG recording.  No sleep was achieved.  Hyperventilation and intermittent photic stimulation were utilized as activating procedures.  Description:In the wakeful state, the best background consisted of a medium amplitude, posterior dominant, well sustained, symmetric and reactive 11 Hz rhythm.  Hyperventilation induced mild, diffuse, physiologic slowing but no epileptiform discharges.  Intermittent photic stimulation did induce a normal driving response.  Frequent, intermittent, at times sharply contoured right temporal theta slowing was noted.  No frank focal or generalized epileptiform discharges noted.  EKG showed sinus rhythm.  Impression: this is an abnormal awake EEG with findings suggestive of focal neuronal dysfunction involving the right temporal region. If clinically indicated, a repeat EEG with sleep deprivation or an ambulatory 24 hour EEG could provide further information regarding patient recent episode of transient alteration of consciousness. Clinical correlation is advised.  Wyatt Portela, MD  [facility].  "  Microbiology: No results found for this or any previous visit (from the past 240 hour(s)).   Labs: Basic Metabolic Panel:  Recent Labs Lab 11/03/12 1320 11/04/12 1125  NA 140 137  K 4.7 4.3  CL 106 103  CO2 25 24  GLUCOSE 105* 98  BUN 19 17  CREATININE 0.70 0.66  CALCIUM 9.4 9.1   Liver Function Tests:  Recent Labs Lab 11/03/12 1320  AST 23  ALT 27  ALKPHOS 75  BILITOT 0.3  PROT 6.7  ALBUMIN 3.8   No results found for this basename: LIPASE, AMYLASE,  in the last 168 hours No results found for this basename: AMMONIA,  in the last 168 hours CBC:  Recent Labs Lab 11/03/12 1320 11/04/12 1125  WBC 6.3 5.3  NEUTROABS 4.1  --  HGB 16.3 16.1  HCT 44.9 44.9  MCV  90.5 90.3  PLT 144* 140*   Cardiac Enzymes:  Recent Labs Lab 11/03/12 1532 11/03/12 2206 11/04/12 1125  TROPONINI <0.30 <0.30 <0.30   BNP: BNP (last 3 results) No results found for this basename: PROBNP,  in the last 8760 hours CBG: No results found for this basename: GLUCAP,  in the last 168 hours  Time coordinating discharge: 45 minutes  Signed:  Apryll Hinkle  [facility] 11/05/2012, 11:00 AM

## 2012-11-05 NOTE — Progress Notes (Signed)
EEG Completed; Results Pending  

## 2012-11-07 ENCOUNTER — Ambulatory Visit: Payer: BC Managed Care – PPO | Admitting: Cardiovascular Disease

## 2012-11-07 ENCOUNTER — Other Ambulatory Visit: Payer: Self-pay | Admitting: Internal Medicine

## 2012-11-07 DIAGNOSIS — I83819 Varicose veins of unspecified lower extremities with pain: Secondary | ICD-10-CM

## 2012-11-10 ENCOUNTER — Ambulatory Visit (INDEPENDENT_AMBULATORY_CARE_PROVIDER_SITE_OTHER): Payer: BC Managed Care – PPO | Admitting: Neurology

## 2012-11-10 ENCOUNTER — Encounter: Payer: Self-pay | Admitting: Neurology

## 2012-11-10 VITALS — BP 153/90 | HR 65 | Ht 75.0 in | Wt 234.0 lb

## 2012-11-10 DIAGNOSIS — R55 Syncope and collapse: Secondary | ICD-10-CM

## 2012-11-10 NOTE — Patient Instructions (Addendum)
Check sleep deprived EEG, Cardionet heart monitor for arrythmias and continue keppra for now. Do not drive till next visitin 2 months

## 2012-11-12 NOTE — Progress Notes (Signed)
[facility] [address] [place]. [place] [address] [phone]       OFFICE CONSULT NOTE  Michael. Michael Whitney Date of Birth:  [birth year] Medical Record Number:  [id number]   Referring MD:  Merri Brunette, MD  Reason for Referral:  seizure  HPI: Michael Whitney is a 72 Caucasian male who's had 2 episodes of brief loss of consciousness. The first one occurred in mid August when he had been working out in the yard. He felt nauseous lightheaded and weak. He felt like he had to sit down and he was sweating. He must have passed out at work because he heard a banging sound over with. He must have been out for about 30 minutes. He felt slightly confused and disoriented for a few minutes. He denied any tongue bite, injury, incontinence. He was taken to the emergency room where  Workup included negative blood work, head CT, normal EKG and chest x-ray was unremarkable. He saw his primary physician and admitted that he was under a lot of stress and complained of dizziness and was given Ativan and meclizine. He had another brief episode of numbness on his left cheek on 11/03/2012 where he was hospitalized at [facility] for 2 days. He underwent extensive evaluation including a brain MRI scan as well as MRA which was normal. EEG showed right temporal slow waves. He was felt to have had a seizure and started on Keppra 500 twice a day which he seems to be tolerating. He denies any prior history of seizures, significant head injury with loss of consciousness, febrile seizures. Is no family history of epilepsy. He denies  drugs or abusing alcohol.  ROS:   14 system review of systems is positive for ringing in the ears, itching, feeling hot, cramps, dizziness, passing out, anxiety, depression and restless legs  PMH:  Past Medical History  Diagnosis Date  . Hypertension   . Cataract   . Seizures     Social History:  History   Social History  . Marital Status: Single    Spouse Name:  N/A    Number of Children: 2  . Years of Education: N/A   Occupational History  .      Photographer   Social History Main Topics  . Smoking status: Never Smoker   . Smokeless tobacco: Never Used  . Alcohol Use: Yes     Comment: occasionally  . Drug Use: No  . Sexual Activity: Not on file   Other Topics Concern  . Not on file   Social History Narrative  . No narrative on file    Medications:   Current Outpatient Prescriptions on File Prior to Visit  Medication Sig Dispense Refill  . levETIRAcetam (KEPPRA) 500 MG tablet Take 1 tablet (500 mg total) by mouth 2 (two) times daily.  60 tablet  0  . lisinopril (PRINIVIL,ZESTRIL) 10 MG tablet Take 10 mg by mouth daily.      . Multiple Vitamin (MULTIVITAMIN WITH MINERALS) TABS tablet Take 1 tablet by mouth daily.       No current facility-administered medications on file prior to visit.    Allergies:  No Known Allergies  Physical Exam General: well developed, well nourished, seated, in no evident distress Head: head normocephalic and atraumatic. Orohparynx benign Neck: supple with no carotid or supraclavicular bruits Cardiovascular: regular rate and rhythm, no murmurs Musculoskeletal: no deformity Skin:  no rash/petichiae Vascular:  Normal pulses all extremities Filed Vitals:   11/10/12 1259  BP: 153/90  Pulse: 65    Neurologic Exam Mental Status: Awake and fully alert. Oriented to place and time. Recent and remote memory intact. Attention span, concentration and fund of knowledge appropriate. Mood and affect appropriate.  Cranial Nerves: Fundoscopic exam reveals sharp disc margins. Pupils equal, briskly reactive to light. Extraocular movements full without nystagmus. Visual fields full to confrontation. Hearing intact. Facial sensation intact. Face, tongue, palate moves normally and symmetrically.  Motor: Normal bulk and tone. Normal strength in all tested extremity muscles. Sensory.: intact to tough and pinprick and  vibratory.  Coordination: Rapid alternating movements normal in all extremities. Finger-to-nose and heel-to-shin performed accurately bilaterally. Gait and Station: Arises from chair without difficulty. Stance is normal. Gait demonstrates normal stride length and balance . Able to heel, toe and tandem walk without difficulty.  Reflexes: 1+ and symmetric. Toes downgoing.    ASSESSMENT: 60 year Caucasian male 2 episodes of brief loss of consciousness possible syncope versus seizures. Underlying anxiety may be contributing      PLAN: Had a long discussion with the patient regarding his symptoms, results of my clinical evaluation, available studies and answered questions. Check sleep deprived EEG, Cardionet heart monitor for arrythmias and continue keppra for now. Do not drive till next visitin 2 months

## 2012-11-17 DIAGNOSIS — R55 Syncope and collapse: Secondary | ICD-10-CM | POA: Insufficient documentation

## 2012-11-25 ENCOUNTER — Other Ambulatory Visit (INDEPENDENT_AMBULATORY_CARE_PROVIDER_SITE_OTHER): Payer: BC Managed Care – PPO | Admitting: Radiology

## 2012-11-25 DIAGNOSIS — R55 Syncope and collapse: Secondary | ICD-10-CM

## 2012-11-28 ENCOUNTER — Telehealth: Payer: Self-pay | Admitting: Neurology

## 2012-11-29 ENCOUNTER — Encounter: Payer: Self-pay | Admitting: *Deleted

## 2012-11-29 ENCOUNTER — Encounter (INDEPENDENT_AMBULATORY_CARE_PROVIDER_SITE_OTHER): Payer: BC Managed Care – PPO

## 2012-11-29 DIAGNOSIS — R55 Syncope and collapse: Secondary | ICD-10-CM

## 2012-11-29 NOTE — Telephone Encounter (Signed)
I called the patient and gave results of sleep deprived EEG which was normal. Patient has been to [facility] for a second opinion and was advised to do a 24-hour EEG instead and a final decision about whether to stop anticonvulsants would be made after that. I made it quite clear to the patient that he needs to decide whether he would like to followup with [facility] or with me. He will decide and let us know. Meanwhile I advised him to reduce the dose of Keppra to 250 mg twice daily as he is having some side effects from it. He was advised not to drive for at least 2 months since his episode of passing out. In case he decides to follow up with me he was advised to call the office and make an appointment in 2 months.

## 2012-11-29 NOTE — Telephone Encounter (Signed)
The patient states he is taking Keppra 500 mg twice a day and its making your blood pressure go high. Its hard to walk, stomach pains and  makes him dizzy  patient been taking medication for 3 weeks. Also he had a EEG done  Last Friday and would like results to see if he had a seizure. Because if he didn't he would like to stopping this medication Altogether. [phone] please advise.

## 2012-11-29 NOTE — Progress Notes (Signed)
Patient ID: Michael Whitney, male   DOB: 03-Jun-1950, 63 y.o.   MRN: [id number] [employer] 30 day cardiac event monitor applied.

## 2012-12-05 NOTE — Progress Notes (Signed)
Quick Note:  I called pt and LMVM for him that EEG study normal (no seizures). He is to call back if questions. ______

## 2012-12-08 ENCOUNTER — Telehealth: Payer: Self-pay | Admitting: Neurology

## 2012-12-09 NOTE — Telephone Encounter (Signed)
Ok to decrease keppra to 250 mg once daily x 1 week and stop. Call back in case he has any episode of loss of consciousness.

## 2012-12-09 NOTE — Telephone Encounter (Signed)
Called patient. Gave instructions. Patient agreed.

## 2012-12-09 NOTE — Telephone Encounter (Signed)
Patient is requesting to titrate off of Keppra completley because of the side effects he is having. He says shortly after taking Keppra 250mg  twice daily, his Systolic  BP increases to 175-180. He is dizzy, nauseas, has diarrhea, and feels off balance. He says this last for about two hours after taking med then subsides.

## 2012-12-22 ENCOUNTER — Ambulatory Visit
Admission: RE | Admit: 2012-12-22 | Discharge: 2012-12-22 | Disposition: A | Discharge status: Home / Self Care | Payer: BC Managed Care – PPO | Source: Ambulatory Visit | Attending: Internal Medicine | Admitting: Internal Medicine

## 2012-12-22 DIAGNOSIS — I83819 Varicose veins of unspecified lower extremities with pain: Secondary | ICD-10-CM

## 2012-12-29 ENCOUNTER — Other Ambulatory Visit (HOSPITAL_COMMUNITY): Payer: Self-pay | Admitting: Interventional Radiology

## 2012-12-29 DIAGNOSIS — I83813 Varicose veins of bilateral lower extremities with pain: Secondary | ICD-10-CM

## 2012-12-30 ENCOUNTER — Other Ambulatory Visit (HOSPITAL_COMMUNITY): Payer: Self-pay | Admitting: Interventional Radiology

## 2012-12-30 DIAGNOSIS — I83813 Varicose veins of bilateral lower extremities with pain: Secondary | ICD-10-CM

## 2013-01-04 ENCOUNTER — Ambulatory Visit
Admission: RE | Admit: 2013-01-04 | Discharge: 2013-01-04 | Disposition: A | Discharge status: Home / Self Care | Payer: BC Managed Care – PPO | Source: Ambulatory Visit | Attending: Interventional Radiology | Admitting: Interventional Radiology

## 2013-01-04 VITALS — BP 163/79 | HR 93 | Temp 98.2°F | Resp 14 | Ht 75.0 in | Wt 232.0 lb

## 2013-01-04 DIAGNOSIS — I83813 Varicose veins of bilateral lower extremities with pain: Secondary | ICD-10-CM

## 2013-01-04 MED ORDER — DIAZEPAM 10 MG PO TABS: 10.0000 mg | ORAL_TABLET | Freq: Once | ORAL | Status: DC

## 2013-01-04 NOTE — Progress Notes (Signed)
1230 Informed consent obtained.  Patient given verbal & written discharge instructions & states that he understands.   Karena Addison (sister-in-law) available to provide transportation post procedure.  1245  22 gauge x 1" insyte catheter started IV Right antecubital on 1st attempt with Saline lock & 7" extension.  Flushed with 10 ml NSS w/o difficulty.  Site "unremarkable.    1250  Valium 5 mg po for preprocedure.  (Patient prefers to start with 5 mg & repeat x 1 if needed).    1320  Procedure started.  1430  Procedure completed.  Patient wearing thigh high graduated compression garment (20-30 mm Hg) on RLE.    1445  IV d/c'd, catheter intact, site unremarkable.    1455  Ambulated x 10 minutes.  Gait steady.  Tolerated well.  1505  Discharged to home.  Sister-in-law here to provide transportation.  [name] Carmell Austria, RN 01/04/2013 3:06 PM

## 2013-01-10 ENCOUNTER — Ambulatory Visit: Payer: BC Managed Care – PPO | Admitting: Neurology

## 2013-01-11 ENCOUNTER — Other Ambulatory Visit (HOSPITAL_COMMUNITY): Payer: Self-pay | Admitting: Interventional Radiology

## 2013-01-11 ENCOUNTER — Ambulatory Visit
Admission: RE | Admit: 2013-01-11 | Discharge: 2013-01-11 | Disposition: A | Discharge status: Home / Self Care | Payer: BC Managed Care – PPO | Source: Ambulatory Visit | Attending: Interventional Radiology | Admitting: Interventional Radiology

## 2013-01-11 DIAGNOSIS — I83813 Varicose veins of bilateral lower extremities with pain: Secondary | ICD-10-CM

## 2013-01-11 DIAGNOSIS — I83812 Varicose veins of left lower extremities with pain: Secondary | ICD-10-CM

## 2013-02-06 ENCOUNTER — Other Ambulatory Visit (HOSPITAL_COMMUNITY): Payer: Self-pay | Admitting: Interventional Radiology

## 2013-02-06 DIAGNOSIS — I83812 Varicose veins of left lower extremities with pain: Secondary | ICD-10-CM

## 2013-02-08 ENCOUNTER — Ambulatory Visit: Payer: BC Managed Care – PPO

## 2013-02-08 ENCOUNTER — Ambulatory Visit
Admission: RE | Admit: 2013-02-08 | Discharge: 2013-02-08 | Disposition: A | Discharge status: Home / Self Care | Payer: BC Managed Care – PPO | Source: Ambulatory Visit | Attending: Interventional Radiology | Admitting: Interventional Radiology

## 2013-02-08 ENCOUNTER — Other Ambulatory Visit: Payer: BC Managed Care – PPO

## 2013-02-08 DIAGNOSIS — I83813 Varicose veins of bilateral lower extremities with pain: Secondary | ICD-10-CM

## 2013-02-22 ENCOUNTER — Other Ambulatory Visit: Payer: BC Managed Care – PPO

## 2013-04-24 ENCOUNTER — Telehealth: Payer: Self-pay | Admitting: Emergency Medicine

## 2013-04-24 NOTE — Telephone Encounter (Signed)
LMOVM TO MAKE PT AWARE THAT THE INS. [place] APPROVED OUR APPEALS FOR HIS EVLT AND SCLERO!!!! ALSO, WANTED TO SEE IF PT WAS READY TO SET UP NEXT TX OR HOLD OFF RIGHT NOW.   05-02-13- PT CALLED BACK TO SET UP NEXT TX OF LLE

## 2013-05-23 ENCOUNTER — Telehealth: Payer: Self-pay | Admitting: Emergency Medicine

## 2013-05-23 NOTE — Telephone Encounter (Addendum)
CALLED PT TO MAKE HIM AWARE THAT HIS INSURANCE WILL NOT APPROVE THE EVLT OF THE LEFT LEG. STATES THAT IT IS NOT MEDICALLY NECESSARY AND HIS PLAN WILL NOT COVER.  THEY WILL NOT SCHEDULE A PEER 2 PEER REVIEW, SINCE THAT MEDICAL DIRECTOR CAN NOT OVERTURN AN APPEAL DECISION.  ONLY THE BOARD CAN OVERTURN AN APPEAL.  CALLED PT TO SEE IF HE WOULD LIKE TO CANCEL THE TX FOR NOW, OR HAVE THE TREATMENT AND IF IT COMES BACK DENIED , WE CAN TRY TO APPEAL THE DECISION AGAIN.  HOWEVER, THE BOARD MAY OR MAY NOT OVERTURN THE DECISION.   PT ELECTED TO CANCEL EVERYTHING FOR RIGHT NOW UNTIL HE CAN SPEAK WITH SOMEONE ABOUT THE POLICY.

## 2013-05-30 ENCOUNTER — Ambulatory Visit: Payer: BC Managed Care – PPO

## 2013-05-30 ENCOUNTER — Other Ambulatory Visit: Payer: BC Managed Care – PPO

## 2013-06-06 ENCOUNTER — Other Ambulatory Visit: Payer: BC Managed Care – PPO

## 2013-06-06 ENCOUNTER — Inpatient Hospital Stay: Admission: RE | Admit: 2013-06-06 | Payer: BC Managed Care – PPO | Source: Ambulatory Visit

## 2013-08-14 ENCOUNTER — Telehealth: Payer: Self-pay | Admitting: Emergency Medicine

## 2013-08-14 NOTE — Telephone Encounter (Signed)
CALLED PT TO LET HIM KNOW THAT [employer] AUTHO THE VEIN TX AND SCLERO//PT GOING TO CALL INS. TO CK ON PAYMENT. [place] # [id number]  FOR [id number]

## 2013-10-25 ENCOUNTER — Ambulatory Visit
Admission: RE | Admit: 2013-10-25 | Discharge: 2013-10-25 | Disposition: A | Discharge status: Home / Self Care | Payer: Medicare Other | Source: Ambulatory Visit | Attending: Interventional Radiology | Admitting: Interventional Radiology

## 2013-10-25 VITALS — BP 169/83 | HR 66 | Temp 97.8°F | Resp 14 | Ht 75.0 in | Wt 235.0 lb

## 2013-10-25 DIAGNOSIS — I83813 Varicose veins of bilateral lower extremities with pain: Secondary | ICD-10-CM

## 2013-10-25 MED ORDER — DIAZEPAM 5 MG PO TABS
10.0000 mg | ORAL_TABLET | Freq: Once | ORAL | Status: DC
Start: 2013-10-25 — End: 2013-10-26

## 2013-10-25 NOTE — Progress Notes (Signed)
0950  Informed consent obtained.  Given verbal & written discharge instructions & states that he understands.    4360  Valium 10 mg po per Dr Aletta Edouard.  1000  22 gauge x 1" insyte catheter started IV Left antecubital (on 1st attempt) with NSL & 7" extension.  Flushed w/ 10 ml NSS w/o difficulty.  Site "unremarkable".  1030  Procedure started.  1215 Procedure completed.  Patient tolerated well.  1230 Wearing thigh high graduated compression garment on LLE (20-30 mm Hg).  Ambulated x 10 minutes, gait steady.  1240  IV discontinued, catheter intact.   Site unremarkable.    62  Sister in law here to provide transportation to home.  Patient discharged to home.    Sion Thane Riki Rusk, RN 10/25/2013 2:50 PM

## 2013-10-26 ENCOUNTER — Other Ambulatory Visit: Payer: BC Managed Care – PPO

## 2013-10-26 ENCOUNTER — Ambulatory Visit: Payer: BC Managed Care – PPO

## 2013-11-01 ENCOUNTER — Ambulatory Visit
Admission: RE | Admit: 2013-11-01 | Discharge: 2013-11-01 | Disposition: A | Discharge status: Home / Self Care | Payer: Medicare Other | Source: Ambulatory Visit | Attending: Interventional Radiology | Admitting: Interventional Radiology

## 2013-11-01 DIAGNOSIS — I83812 Varicose veins of left lower extremities with pain: Secondary | ICD-10-CM

## 2013-11-01 NOTE — Progress Notes (Signed)
Reason for Consult:  1 week post EVLT follow-up evaluation  Chief Complaint: No chief complaint on file.   Referring Physician(s): Yamagata,Glenn T  History of Present Illness:  Michael Whitney is a 63 y.o. male with bilateral symptomatic venous insufficiency. On 10/25/2013 he underwent endovenous laser occlusion of the left small saphenous vein as well as ultrasound-guided foam sclerotherapy of left calf varicosities. He presents today for his scheduled 1 week followup evaluation.     Unfortunately, several days after his endovenous ablation therapy he slipped in his compression hose on his tile floor and fell sustaining a scalp laceration and multiple bruises. He was treated in the emergency room and released. Secondary to generalized soreness from his fall, has been difficult for him to assess the comfort level of this left lower extremity. He says that compared to the rest of his body his leg of feels great. He denies fever, chills or other systemic symptoms. He has been compliant with wearing his compression hose.    Past Medical History  Diagnosis Date  . Hypertension   . Cataract   . Seizures     Past Surgical History  Procedure Laterality Date  . Cataract extraction    . Varicose vein surgery    . Tonsillectomy      Allergies: Review of patient's allergies indicates no known allergies.  Medications: Prior to Admission medications   Medication Sig Start Date End Date Taking? Authorizing Provider  diazepam (VALIUM) 10 MG tablet Take 1 tablet (10 mg total) by mouth once. 01/04/13   Azzie Roup, MD  levETIRAcetam (KEPPRA) 500 MG tablet Take 1 tablet (500 mg total) by mouth 2 (two) times daily. 11/05/12   Janece Canterbury, MD  lisinopril (PRINIVIL,ZESTRIL) 10 MG tablet Take 10 mg by mouth daily.    Historical Provider, MD  Multiple Vitamin (MULTIVITAMIN WITH MINERALS) TABS tablet Take 1 tablet by mouth daily.    Historical Provider, MD    Family History  Problem  Relation Age of Onset  . CAD Mother     MI at age 70    History   Social History  . Marital Status: Single    Spouse Name: N/A    Number of Children: 2  . Years of Education: N/A   Occupational History  .      Photographer   Social History Main Topics  . Smoking status: Never Smoker   . Smokeless tobacco: Never Used  . Alcohol Use: Yes     Comment: occasionally  . Drug Use: No  . Sexual Activity: Not on file   Other Topics Concern  . Not on file   Social History Narrative  . No narrative on file    Review of Systems   Physical Exam  Skin: Skin is warm, dry and intact. Bruising noted. No lesion noted. No erythema.     Access site well healed     Imaging: [place] Injec Sclerotherapy Single  10/25/2013   CLINICAL DATA:  Symptomatic venous insufficiency of the left short saphenous vein with symptomatic varicose veins. Prior surgical stripping of the left great saphenous vein.  EXAM: 1. TRANSCATHETER ENDOVENOUS LASER OCCLUSION OF THE LEFT LESSER SAPHENOUS VEIN UNDER ULTRASOUND GUIDANCE 2. ULTRASOUND-GUIDED FOAM SCLEROTHERAPY OF LEFT CALF VARICOSITIES  FLUOROSCOPY TIME:  No fluoroscopy utilized.  ANESTHESIA/SEDATION: 10 mg oral Valium.  PROCEDURE: Tumescent: 500 mL  Laser:  1470 nm, 8 watts power  Access: Left short saphenous vein in distal calf  Length of segment treated: 47  cm  Pull back time: 270 seconds  Total energy:  2,155 J  Sclerosant:  3 mL of 1% STS  The procedure, risks, benefits, and alternatives were explained to the patient. Questions regarding the procedure were encouraged and answered. The patient understands and consents to the procedure.  The anterior and medial calf was prepped with chlorhexidine. Foam sclerotherapy was performed under direct ultrasound guidance. A total of 3 mL of 1% STS was utilized and foamed with an equal volume of air. Multiple injections were performed of varicosities via a 25 gauge needle under ultrasound guidance at the level of the  anterior and medial calf.  Ultrasound was performed of the left short saphenous vein in a prone position and its course marked on the skin. The left posterior thigh and calf was prepped with Betadine in a sterile fashion, and a sterile split drape was applied covering the operative field. A sterile gown and sterile gloves were used for the procedure.  Under sonographic guidance, a micropuncture needle was inserted into the left lesser saphenous vein at the level of the distal calf, which was noted to be patent. It was removed over an 018 wire which was upsized to an 035 J- wire utilizing the micropuncture set. This was advanced into the mid posterior thigh under ultrasound guidance. A 60 cm, 4-French sheath was inserted over the wire. The Angiodynamics 1470 nm laser was inserted into the sheath, and was positioned under ultrasound guidance.  Tumescent anesthesia was instilled along the lesser saphenous vein providing a 1 cm buffer. Laser mediated occlusion was then performed. The sheath was then removed. A compressive dressing was applied. A 20-30 mm Hg graded compression stocking was immediately applied post procedure. The patient ambulated prior to discharge.  FINDINGS: Initial sclerotherapy was performed of multiple large calf varicosities that communicate with several prominent and incompetent perforator veins as well as the short saphenous vein.  The short saphenous vein was again noted to be enlarged and communicates with multiple varicosities in the calf and also some varicosities in the distal thigh. There is a continuation of the short saphenous vein in the posterior thigh in a superficial position consistent with a vein of Giacomini. No saphenopopliteal junction is present. The laser was advanced to a position where the vein begins to extend deeper in the posterior thigh and above the level of communication with varicosities.  Laser occlusion of the short saphenous vein as well as the extension of the vein  into the thigh was uncomplicated.  COMPLICATIONS: None  IMPRESSION: Ultrasound-guided foam sclerotherapy performed of multiple left calf varicosities. Successful endovenous laser occlusion of the left short saphenous vein and its extension in the thigh consistent with a vein of Giacomini.   Electronically Signed   By: Aletta Edouard M.D.   On: 10/25/2013 16:57   Ir Embo Venous Not [place]  10/25/2013   CLINICAL DATA:  Symptomatic venous insufficiency of the left short saphenous vein with symptomatic varicose veins. Prior surgical stripping of the left great saphenous vein.  EXAM: 1. TRANSCATHETER ENDOVENOUS LASER OCCLUSION OF THE LEFT LESSER SAPHENOUS VEIN UNDER ULTRASOUND GUIDANCE 2. ULTRASOUND-GUIDED FOAM SCLEROTHERAPY OF LEFT CALF VARICOSITIES  FLUOROSCOPY TIME:  No fluoroscopy utilized.  ANESTHESIA/SEDATION: 10 mg oral Valium.  PROCEDURE: Tumescent: 500 mL  Laser:  1470 nm, 8 watts power  Access: Left short saphenous vein in distal calf  Length of segment treated: 47 cm  Pull back time: 270 seconds  Total energy:  2,155 J  Sclerosant:  3 mL of 1% STS  The procedure, risks, benefits, and alternatives were explained to the patient. Questions regarding the procedure were encouraged and answered. The patient understands and consents to the procedure.  The anterior and medial calf was prepped with chlorhexidine. Foam sclerotherapy was performed under direct ultrasound guidance. A total of 3 mL of 1% STS was utilized and foamed with an equal volume of air. Multiple injections were performed of varicosities via a 25 gauge needle under ultrasound guidance at the level of the anterior and medial calf.  Ultrasound was performed of the left short saphenous vein in a prone position and its course marked on the skin. The left posterior thigh and calf was prepped with Betadine in a sterile fashion, and a sterile split drape was applied covering the operative field. A sterile gown and sterile  gloves were used for the procedure.  Under sonographic guidance, a micropuncture needle was inserted into the left lesser saphenous vein at the level of the distal calf, which was noted to be patent. It was removed over an 018 wire which was upsized to an 035 J- wire utilizing the micropuncture set. This was advanced into the mid posterior thigh under ultrasound guidance. A 60 cm, 4-French sheath was inserted over the wire. The Angiodynamics 1470 nm laser was inserted into the sheath, and was positioned under ultrasound guidance.  Tumescent anesthesia was instilled along the lesser saphenous vein providing a 1 cm buffer. Laser mediated occlusion was then performed. The sheath was then removed. A compressive dressing was applied. A 20-30 mm Hg graded compression stocking was immediately applied post procedure. The patient ambulated prior to discharge.  FINDINGS: Initial sclerotherapy was performed of multiple large calf varicosities that communicate with several prominent and incompetent perforator veins as well as the short saphenous vein.  The short saphenous vein was again noted to be enlarged and communicates with multiple varicosities in the calf and also some varicosities in the distal thigh. There is a continuation of the short saphenous vein in the posterior thigh in a superficial position consistent with a vein of Giacomini. No saphenopopliteal junction is present. The laser was advanced to a position where the vein begins to extend deeper in the posterior thigh and above the level of communication with varicosities.  Laser occlusion of the short saphenous vein as well as the extension of the vein into the thigh was uncomplicated.  COMPLICATIONS: None  IMPRESSION: Ultrasound-guided foam sclerotherapy performed of multiple left calf varicosities. Successful endovenous laser occlusion of the left short saphenous vein and its extension in the thigh consistent with a vein of Giacomini.   Electronically Signed    By: Aletta Edouard M.D.   On: 10/25/2013 16:57    Labs: Lab Results  Component Value Date   WBC 5.3 11/04/2012   HCT 44.9 11/04/2012   MCV 90.3 11/04/2012   PLT 140* 11/04/2012   NA 137 11/04/2012   K 4.3 11/04/2012   CL 103 11/04/2012   CO2 24 11/04/2012   GLUCOSE 98 11/04/2012   BUN 17 11/04/2012   CREATININE 0.66 11/04/2012   CALCIUM 9.1 11/04/2012   PROT 6.7 11/03/2012   ALBUMIN 3.8 11/03/2012   AST 23 11/03/2012   ALT 27 11/03/2012   ALKPHOS 75 11/03/2012   BILITOT 0.3 11/03/2012   GFRNONAA >90 11/04/2012   GFRAA >90 11/04/2012    Assessment and Plan:  Doing very well 1 week s/p EVLT and sclerotherapy.  No evidence of complication or recanalization.   -  Follow-up with Dr. Kathlene Cote in 1 month.  - Small patent superficial varicosities amenable to sclerotherapy if clinically warranted    I spent a total of 15 minutes face to face in clinical consultation, greater than 50% of which was counseling/coordinating care for venous insufficiency  Signed,  Criselda Peaches, MD [facility]

## 2013-11-07 ENCOUNTER — Ambulatory Visit
Admission: RE | Admit: 2013-11-07 | Discharge: 2013-11-07 | Disposition: A | Discharge status: Home / Self Care | Payer: Medicare Other | Source: Ambulatory Visit | Attending: Interventional Radiology | Admitting: Interventional Radiology

## 2013-11-07 ENCOUNTER — Other Ambulatory Visit (HOSPITAL_COMMUNITY): Payer: Self-pay | Admitting: Interventional Radiology

## 2013-11-07 DIAGNOSIS — I8390 Asymptomatic varicose veins of unspecified lower extremity: Secondary | ICD-10-CM

## 2013-11-07 NOTE — Progress Notes (Signed)
Chief Complaint: Pain and tightness of posterior left leg.  History of Present Illness: Michael Whitney is a 63 y.o. male status post endovenous laser occlusion of the left short saphenous vein and sclerotherapy of left calf varicosities on 10/25/2013. He feels that symptoms of left lower extremity pain and edema have improved. He has noticed some erythema along the posterior distal calf near the laser insertion site as well as tightness and discomfort in this region. He denies fever, chills, drainage at the site or ulceration.  Past Medical History  Diagnosis Date  . Hypertension   . Cataract   . Seizures     Past Surgical History  Procedure Laterality Date  . Cataract extraction    . Varicose vein surgery    . Tonsillectomy      Allergies: Review of patient's allergies indicates no known allergies.  Medications: Prior to Admission medications   Medication Sig Start Date End Date Taking? Authorizing Provider  cyclobenzaprine (FLEXERIL) 10 MG tablet Take 10 mg by mouth.    Historical Provider, MD  diazepam (VALIUM) 10 MG tablet Take 1 tablet (10 mg total) by mouth once. 01/04/13   Azzie Roup, MD  FLUoxetine (PROZAC) 20 MG capsule Take 20 mg by mouth.    Historical Provider, MD  levETIRAcetam (KEPPRA) 500 MG tablet Take 1 tablet (500 mg total) by mouth 2 (two) times daily. 11/05/12   Janece Canterbury, MD  lisinopril (PRINIVIL,ZESTRIL) 10 MG tablet Take 10 mg by mouth daily.    Historical Provider, MD  LORazepam (ATIVAN) 0.5 MG tablet Take 0.5 mg by mouth.    Historical Provider, MD  Meclizine HCl (TRAVEL SICKNESS) 25 MG CHEW Chew 25 mg by mouth.    Historical Provider, MD  Multiple Vitamin (MULTIVITAMIN WITH MINERALS) TABS tablet Take 1 tablet by mouth daily.    Historical Provider, MD    Family History  Problem Relation Age of Onset  . CAD Mother     MI at age 43    History   Social History  . Marital Status: Single    Spouse Name: N/A    Number of Children: 2  .  Years of Education: N/A   Occupational History  .      Photographer   Social History Main Topics  . Smoking status: Never Smoker   . Smokeless tobacco: Never Used  . Alcohol Use: Yes     Comment: occasionally  . Drug Use: No  . Sexual Activity: Not on file   Other Topics Concern  . Not on file   Social History Narrative  . No narrative on file    Review of Systems: A 12 point ROS discussed and pertinent positives are indicated in the HPI above.  All other systems are negative.  Review of Systems  Constitutional: Negative for fever and chills.  Musculoskeletal: Negative for joint swelling and myalgias.    Vital Signs: BP 172/100  Pulse 75  Temp(Src) 97.9 F (36.6 C)  Resp 18  SpO2 97%  Physical Exam  Constitutional: No distress.  Musculoskeletal:  Region of skin erythema of the posterior lower left calf. This region shows some tenseness without fluctuation. No wound is identified. This is located near the insertion site of the laser fiber into the short saphenous vein.    Imaging: US Venous Img Lower Unilateral Left  11/01/2013   CLINICAL DATA:  63 year old male with bilateral symptomatic venous insufficiency. On 10/25/2013 he underwent trans catheter endovenous laser occlusion of the left  small saphenous vein as well as ultrasound-guided foam sclerotherapy of left calf varicosities. He presents today for his scheduled 1 week followup evaluation.  EXAM: LEFT LOWER EXTREMITY VENOUS DUPLEX ULTRASOUND  TECHNIQUE: Gray-scale sonography with graded compression, as well as color Doppler and duplex ultrasound, were performed to evaluate the deep and superficial veins of the lower extremity. Spectral Doppler was utilized to evaluate flow at rest and with distal augmentation maneuvers. I personally performed the technical portion of the exam.  COMPARISON:  Procedural images 10/25/2013; prior venous evaluation 01/11/2013  FINDINGS: DEEP VENOUS SYSTEM: Evaluation of the deep venous  system including the common femoral, femoral, profunda femoral, popliteal and posterior tibial veins demonstrates no evidence of a deep venous thrombosis. The vessels remain patent and compressible. Normal respiratory phases 50 and response to augmentation.  SUPERFICIAL SYSTEM: Successful occlusion of the small saphenous vein. The previously a treated superficial venous varicosities in the proximal, mid and distal calf are also successfully thrombosed. Varicosities in the distal thigh posteriorly remain patent and compressible. Additionally, there are a few small patent varicosities medial to the knee and in the proximal calf.  IMPRESSION: 1. No evidence of a deep venous thrombosis. 2. Successful endovenous laser occlusion of the left small saphenous vein. 3. Successful sclerotherapy occlusion of a multiple superficial venous varicosities in the proximal, mid and distal calf. 4. Additional superficial venous varicosities in the medial and posterior distal thigh and at the level of the knee remain patent. These may be amenable to percutaneous sclerotherapy if clinically warranted.  Signed,  Criselda Peaches, MD  [facility]  [facility]   Electronically Signed   By: Jacqulynn Cadet M.D.   On: 11/01/2013 13:23   [place] Injec Sclerotherapy Single  10/25/2013   CLINICAL DATA:  Symptomatic venous insufficiency of the left short saphenous vein with symptomatic varicose veins. Prior surgical stripping of the left great saphenous vein.  EXAM: 1. TRANSCATHETER ENDOVENOUS LASER OCCLUSION OF THE LEFT LESSER SAPHENOUS VEIN UNDER ULTRASOUND GUIDANCE 2. ULTRASOUND-GUIDED FOAM SCLEROTHERAPY OF LEFT CALF VARICOSITIES  FLUOROSCOPY TIME:  No fluoroscopy utilized.  ANESTHESIA/SEDATION: 10 mg oral Valium.  PROCEDURE: Tumescent: 500 mL  Laser:  1470 nm, 8 watts power  Access: Left short saphenous vein in distal calf  Length of segment treated: 47 cm  Pull back time: 270 seconds  Total  energy:  2,155 J  Sclerosant:  3 mL of 1% STS  The procedure, risks, benefits, and alternatives were explained to the patient. Questions regarding the procedure were encouraged and answered. The patient understands and consents to the procedure.  The anterior and medial calf was prepped with chlorhexidine. Foam sclerotherapy was performed under direct ultrasound guidance. A total of 3 mL of 1% STS was utilized and foamed with an equal volume of air. Multiple injections were performed of varicosities via a 25 gauge needle under ultrasound guidance at the level of the anterior and medial calf.  Ultrasound was performed of the left short saphenous vein in a prone position and its course marked on the skin. The left posterior thigh and calf was prepped with Betadine in a sterile fashion, and a sterile split drape was applied covering the operative field. A sterile gown and sterile gloves were used for the procedure.  Under sonographic guidance, a micropuncture needle was inserted into the left lesser saphenous vein at the level of the distal calf, which was noted to be patent. It was removed over an 018 wire which was upsized to an  035 J- wire utilizing the micropuncture set. This was advanced into the mid posterior thigh under ultrasound guidance. A 60 cm, 4-French sheath was inserted over the wire. The Angiodynamics 1470 nm laser was inserted into the sheath, and was positioned under ultrasound guidance.  Tumescent anesthesia was instilled along the lesser saphenous vein providing a 1 cm buffer. Laser mediated occlusion was then performed. The sheath was then removed. A compressive dressing was applied. A 20-30 mm Hg graded compression stocking was immediately applied post procedure. The patient ambulated prior to discharge.  FINDINGS: Initial sclerotherapy was performed of multiple large calf varicosities that communicate with several prominent and incompetent perforator veins as well as the short saphenous vein.   The short saphenous vein was again noted to be enlarged and communicates with multiple varicosities in the calf and also some varicosities in the distal thigh. There is a continuation of the short saphenous vein in the posterior thigh in a superficial position consistent with a vein of Giacomini. No saphenopopliteal junction is present. The laser was advanced to a position where the vein begins to extend deeper in the posterior thigh and above the level of communication with varicosities.  Laser occlusion of the short saphenous vein as well as the extension of the vein into the thigh was uncomplicated.  COMPLICATIONS: None  IMPRESSION: Ultrasound-guided foam sclerotherapy performed of multiple left calf varicosities. Successful endovenous laser occlusion of the left short saphenous vein and its extension in the thigh consistent with a vein of Giacomini.   Electronically Signed   By: Aletta Edouard M.D.   On: 10/25/2013 16:57   [place] Rad Eval And Mgmt  11/01/2013   : ESTABLISHED PT LEVEL 2  Please see dictated noted in EPIC.   Electronically Signed   By: Jacqulynn Cadet M.D.   On: 11/01/2013 13:38   Ir Embo Venous Not [place]  10/25/2013   CLINICAL DATA:  Symptomatic venous insufficiency of the left short saphenous vein with symptomatic varicose veins. Prior surgical stripping of the left great saphenous vein.  EXAM: 1. TRANSCATHETER ENDOVENOUS LASER OCCLUSION OF THE LEFT LESSER SAPHENOUS VEIN UNDER ULTRASOUND GUIDANCE 2. ULTRASOUND-GUIDED FOAM SCLEROTHERAPY OF LEFT CALF VARICOSITIES  FLUOROSCOPY TIME:  No fluoroscopy utilized.  ANESTHESIA/SEDATION: 10 mg oral Valium.  PROCEDURE: Tumescent: 500 mL  Laser:  1470 nm, 8 watts power  Access: Left short saphenous vein in distal calf  Length of segment treated: 47 cm  Pull back time: 270 seconds  Total energy:  2,155 J  Sclerosant:  3 mL of 1% STS  The procedure, risks, benefits, and alternatives were explained to the patient. Questions  regarding the procedure were encouraged and answered. The patient understands and consents to the procedure.  The anterior and medial calf was prepped with chlorhexidine. Foam sclerotherapy was performed under direct ultrasound guidance. A total of 3 mL of 1% STS was utilized and foamed with an equal volume of air. Multiple injections were performed of varicosities via a 25 gauge needle under ultrasound guidance at the level of the anterior and medial calf.  Ultrasound was performed of the left short saphenous vein in a prone position and its course marked on the skin. The left posterior thigh and calf was prepped with Betadine in a sterile fashion, and a sterile split drape was applied covering the operative field. A sterile gown and sterile gloves were used for the procedure.  Under sonographic guidance, a micropuncture needle was inserted into the left lesser saphenous  vein at the level of the distal calf, which was noted to be patent. It was removed over an 018 wire which was upsized to an 035 J- wire utilizing the micropuncture set. This was advanced into the mid posterior thigh under ultrasound guidance. A 60 cm, 4-French sheath was inserted over the wire. The Angiodynamics 1470 nm laser was inserted into the sheath, and was positioned under ultrasound guidance.  Tumescent anesthesia was instilled along the lesser saphenous vein providing a 1 cm buffer. Laser mediated occlusion was then performed. The sheath was then removed. A compressive dressing was applied. A 20-30 mm Hg graded compression stocking was immediately applied post procedure. The patient ambulated prior to discharge.  FINDINGS: Initial sclerotherapy was performed of multiple large calf varicosities that communicate with several prominent and incompetent perforator veins as well as the short saphenous vein.  The short saphenous vein was again noted to be enlarged and communicates with multiple varicosities in the calf and also some varicosities  in the distal thigh. There is a continuation of the short saphenous vein in the posterior thigh in a superficial position consistent with a vein of Giacomini. No saphenopopliteal junction is present. The laser was advanced to a position where the vein begins to extend deeper in the posterior thigh and above the level of communication with varicosities.  Laser occlusion of the short saphenous vein as well as the extension of the vein into the thigh was uncomplicated.  COMPLICATIONS: None  IMPRESSION: Ultrasound-guided foam sclerotherapy performed of multiple left calf varicosities. Successful endovenous laser occlusion of the left short saphenous vein and its extension in the thigh consistent with a vein of Giacomini.   Electronically Signed   By: Aletta Edouard M.D.   On: 10/25/2013 16:57    Labs: Lab Results  Component Value Date   WBC 5.3 11/04/2012   HCT 44.9 11/04/2012   MCV 90.3 11/04/2012   PLT 140* 11/04/2012   NA 137 11/04/2012   K 4.3 11/04/2012   CL 103 11/04/2012   CO2 24 11/04/2012   GLUCOSE 98 11/04/2012   BUN 17 11/04/2012   CREATININE 0.66 11/04/2012   CALCIUM 9.1 11/04/2012   PROT 6.7 11/03/2012   ALBUMIN 3.8 11/03/2012   AST 23 11/03/2012   ALT 27 11/03/2012   ALKPHOS 75 11/03/2012   BILITOT 0.3 11/03/2012   GFRNONAA >90 11/04/2012   GFRAA >90 11/04/2012    Assessment and Plan:  The posterior left calf erythema is likely related to post procedural inflammation and potentially a component of superficial thrombophlebitis after laser therapy. There is no indication currently of overt cellulitis or infection. I told the patient that I do not currently recommend antibiotics. I did recommend application of ice to the region, elevation of the leg and use of ibuprofen around the clock for the next 5 days. The patient is also regularly wearing compression garments during waking hours. I will be followup with the patient in 3 weeks, at which time ultrasound of the veins will be performed following  treatment.  I spent a total of 20 minutes face to face, greater than 50% of which was counseling/coordinating care for left lower extremity venous insufficiency.

## 2013-11-07 NOTE — Progress Notes (Signed)
Patient called to report ongoing soreness and wetness at "incision site" on the inside of the left leg above the ankle where "the laser probably went in."  Denies fever or pus from site.  States there is a hard lump there.  Patient to come in this afternoon for assessment.  Brita Romp, RN

## 2013-11-28 ENCOUNTER — Ambulatory Visit
Admission: RE | Admit: 2013-11-28 | Discharge: 2013-11-28 | Disposition: A | Discharge status: Home / Self Care | Payer: Medicare Other | Source: Ambulatory Visit | Attending: Interventional Radiology | Admitting: Interventional Radiology

## 2013-11-28 ENCOUNTER — Encounter (INDEPENDENT_AMBULATORY_CARE_PROVIDER_SITE_OTHER): Payer: Self-pay

## 2013-11-28 DIAGNOSIS — I83812 Varicose veins of left lower extremities with pain: Secondary | ICD-10-CM

## 2013-11-28 NOTE — Progress Notes (Signed)
Chief Complaint: Status post endovenous laser occlusion of the left short saphenous vein with foam sclerotherapy of left lower extremity varicosities.  Referring Physician(s): Maleiah Dula T  History of Present Illness: Michael Whitney is a 63 y.o. male status post endovenous laser occlusion of the left short saphenous vein on 10/25/2013 with additional foam sclerotherapy of varicosities under ultrasound guidance. He has done well after the procedure. Previous complaint of an area of erythema of the posterior distal calf has resolved. The patient has noted overall improvement in symptoms of left lower extremity pain and edema since treatment. He has been wearing his compression garment regularly and performing regular walking.  Past Medical History  Diagnosis Date  . Hypertension   . Cataract   . Seizures     Past Surgical History  Procedure Laterality Date  . Cataract extraction    . Varicose vein surgery    . Tonsillectomy      Allergies: Review of patient's allergies indicates no known allergies.  Medications: Prior to Admission medications   Medication Sig Start Date End Date Taking? Authorizing Provider  cyclobenzaprine (FLEXERIL) 10 MG tablet Take 10 mg by mouth.    Historical Provider, MD  diazepam (VALIUM) 10 MG tablet Take 1 tablet (10 mg total) by mouth once. 01/04/13   Azzie Roup, MD  FLUoxetine (PROZAC) 20 MG capsule Take 20 mg by mouth.    Historical Provider, MD  levETIRAcetam (KEPPRA) 500 MG tablet Take 1 tablet (500 mg total) by mouth 2 (two) times daily. 11/05/12   Janece Canterbury, MD  lisinopril (PRINIVIL,ZESTRIL) 10 MG tablet Take 10 mg by mouth daily.    Historical Provider, MD  LORazepam (ATIVAN) 0.5 MG tablet Take 0.5 mg by mouth.    Historical Provider, MD  Meclizine HCl (TRAVEL SICKNESS) 25 MG CHEW Chew 25 mg by mouth.    Historical Provider, MD  Multiple Vitamin (MULTIVITAMIN WITH MINERALS) TABS tablet Take 1 tablet by mouth daily.    Historical  Provider, MD    Family History  Problem Relation Age of Onset  . CAD Mother     MI at age 3    History   Social History  . Marital Status: Single    Spouse Name: N/A    Number of Children: 2  . Years of Education: N/A   Occupational History  .      Photographer   Social History Main Topics  . Smoking status: Never Smoker   . Smokeless tobacco: Never Used  . Alcohol Use: Yes     Comment: occasionally  . Drug Use: No  . Sexual Activity: Not on file   Other Topics Concern  . Not on file   Social History Narrative  . No narrative on file    Review of Systems: A 12 point ROS discussed and pertinent positives are indicated in the HPI above.  All other systems are negative.  Review of Systems  Constitutional: Negative for fever and chills.  Respiratory: Negative.   Cardiovascular: Negative.   Gastrointestinal: Negative.   Musculoskeletal:       No erythema, wound or tenderness of the lower extremities.  Skin: Negative for rash and wound.    Vital Signs: There were no vitals taken for this visit.  Physical Exam  Constitutional: No distress.  Musculoskeletal: He exhibits no edema and no tenderness.  Skin: Skin is warm and dry. No rash noted. No erythema.  Extremities:  No erythema, tenderness, fluctuance or wound at the level of  recent left lower extremity treatment. There are a few palpable varicosities of the left medial mid to distal thigh and posterior proximal calf.  Imaging: US Venous Img Lower Unilateral Left  11/01/2013   CLINICAL DATA:  63 year old male with bilateral symptomatic venous insufficiency. On 10/25/2013 he underwent trans catheter endovenous laser occlusion of the left small saphenous vein as well as ultrasound-guided foam sclerotherapy of left calf varicosities. He presents today for his scheduled 1 week followup evaluation.  EXAM: LEFT LOWER EXTREMITY VENOUS DUPLEX ULTRASOUND  TECHNIQUE: Gray-scale sonography with graded compression, as well as  color Doppler and duplex ultrasound, were performed to evaluate the deep and superficial veins of the lower extremity. Spectral Doppler was utilized to evaluate flow at rest and with distal augmentation maneuvers. I personally performed the technical portion of the exam.  COMPARISON:  Procedural images 10/25/2013; prior venous evaluation 01/11/2013  FINDINGS: DEEP VENOUS SYSTEM: Evaluation of the deep venous system including the common femoral, femoral, profunda femoral, popliteal and posterior tibial veins demonstrates no evidence of a deep venous thrombosis. The vessels remain patent and compressible. Normal respiratory phases 50 and response to augmentation.  SUPERFICIAL SYSTEM: Successful occlusion of the small saphenous vein. The previously a treated superficial venous varicosities in the proximal, mid and distal calf are also successfully thrombosed. Varicosities in the distal thigh posteriorly remain patent and compressible. Additionally, there are a few small patent varicosities medial to the knee and in the proximal calf.  IMPRESSION: 1. No evidence of a deep venous thrombosis. 2. Successful endovenous laser occlusion of the left small saphenous vein. 3. Successful sclerotherapy occlusion of a multiple superficial venous varicosities in the proximal, mid and distal calf. 4. Additional superficial venous varicosities in the medial and posterior distal thigh and at the level of the knee remain patent. These may be amenable to percutaneous sclerotherapy if clinically warranted.  Signed,  Criselda Peaches, MD  [facility]  [facility]   Electronically Signed   By: Jacqulynn Cadet M.D.   On: 11/01/2013 13:23   [place] Rad Eval And Mgmt  11/01/2013   : ESTABLISHED PT LEVEL 2  Please see dictated noted in EPIC.   Electronically Signed   By: Jacqulynn Cadet M.D.   On: 11/01/2013 13:38    Labs:  CBC: No results found for this basename: WBC, HGB, HCT, PLT,  in  the last 8760 hours  COAGS: No results found for this basename: INR, APTT,  in the last 8760 hours  BMP: No results found for this basename: CMP, NA, K, CL, CO2, GLUCOSE, BUN, CALCIUM, CREATININE, GFRNONAA, GFRAA,  in the last 8760 hours  LIVER FUNCTION TESTS: No results found for this basename: BILITOT, AST, ALT, ALKPHOS, PROT, ALBUMIN,  in the last 8760 hours  TUMOR MARKERS: No results found for this basename: AFPTM, CEA, CA199, CHROMGRNA,  in the last 8760 hours  Assessment and Plan:  Followup venous ultrasound of the left lower extremity today demonstrates complete thrombosis of the treated segment of the left short saphenous vein as well as thrombosis of multiple communicating varicosities in the calf. There are some patent varicosities in the proximal posterior calf that appear to indicate with at least one open perforator vein. Some patent varicosities are also identified in the medial thigh. The deep venous system is normally patent.  The patient is doing well with improvement in symptoms of the left lower extremity since treatment. He has noticed decreased pain and edema. The treated segment of the  left short saphenous vein shows complete thrombosis. Multiple communicating varicosities also show thrombosis. There are some patent varicosities remaining in the proximal calf and in the thigh. These currently do not appear to be significantly symptomatic and I did not recommend additional sclerotherapy at this time. The patient will notify the clinic with any worsening or new symptoms in either extremity. I encouraged continued regular use of compression garments and regular walking.  I spent a total of 20 minutes face to face in clinical consultation, greater than 50% of which was counseling/coordinating care for venous insufficiency.  SignedAletta Edouard T 11/28/2013, 11:16 AM

## 2014-01-15 IMAGING — US IR EMBO VENOUS NOT [PERSON_NAME]  INC GUIDE ROADMAPPING
1 series · 12 of 24 positions shown · non-contrast
Comparison: none

CLINICAL DATA: Significant symptomatic bilateral lower extremity
venous insufficiency and varicosities. The patient presents for
treatment of the right lower extremity with previous evaluation
demonstrating significant incompetence of the great saphenous vein.

[Series 1: ir embo venous not (person_name) inc guide roadmap · 14 acquisitions, 12 frames shown]
[im 2/14]
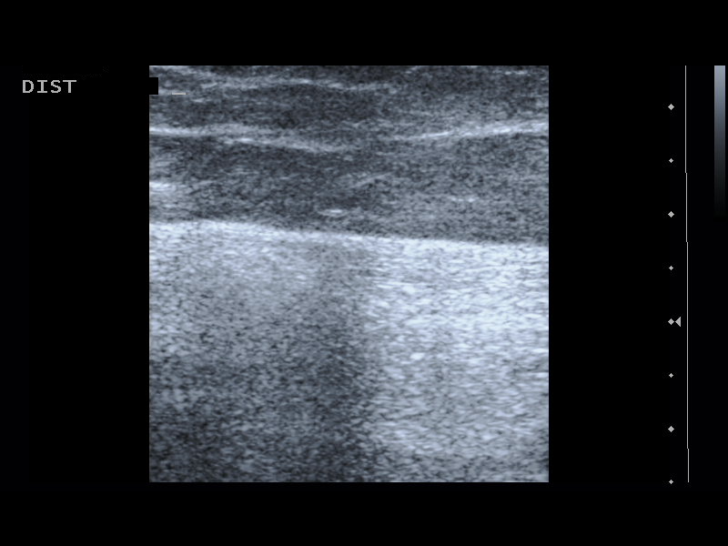
[im 3/14]
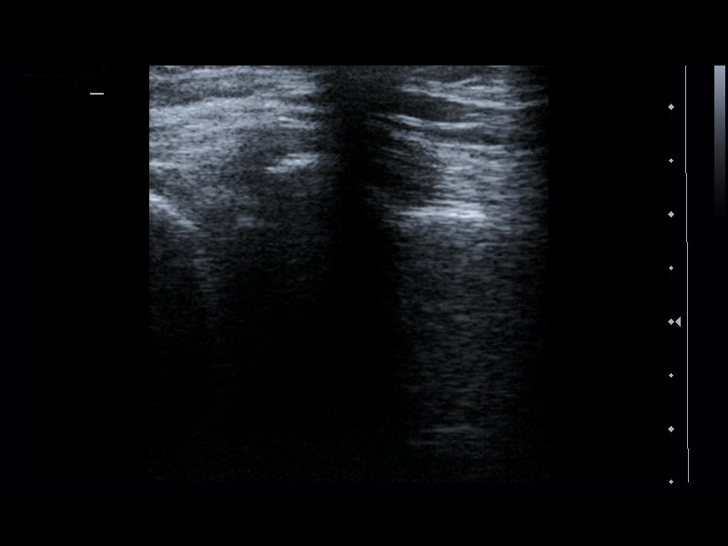
[im 4/14]
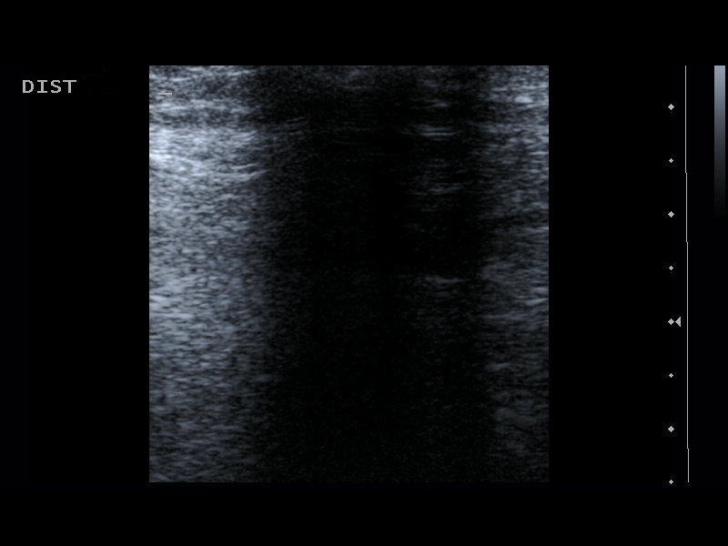
[im 5/14]
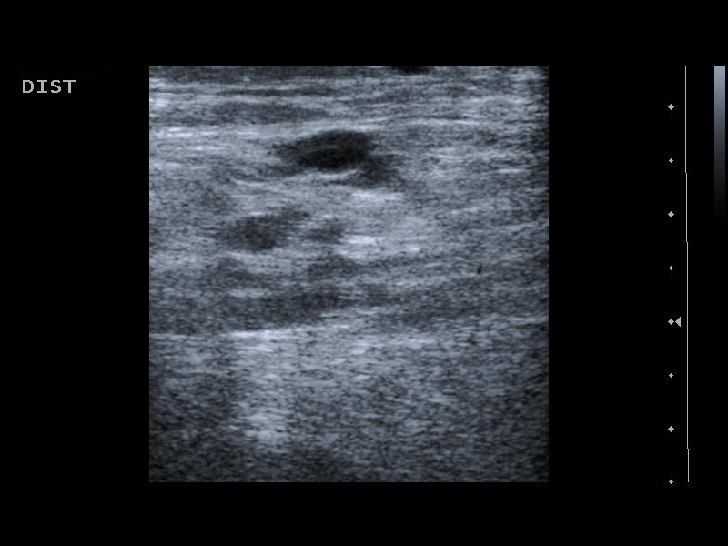
[im 6/14]
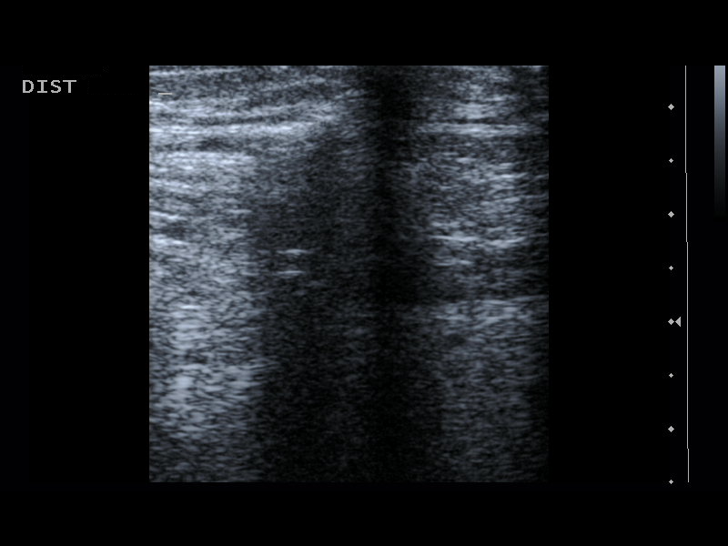
[im 7/14]
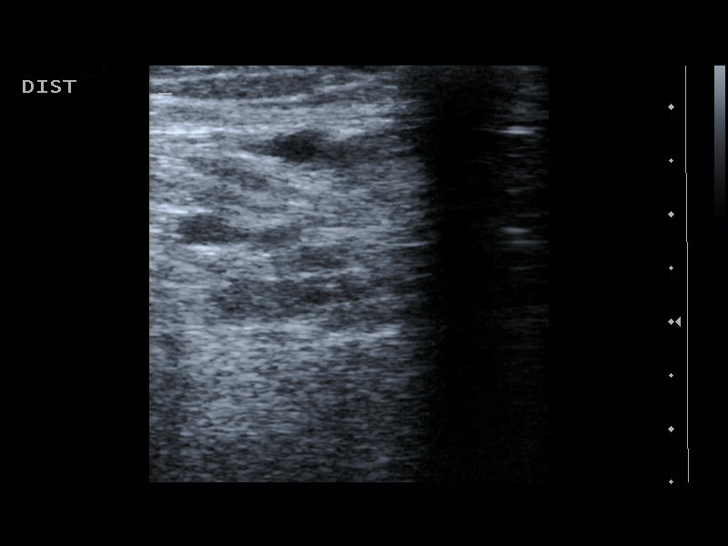
[im 9/14]
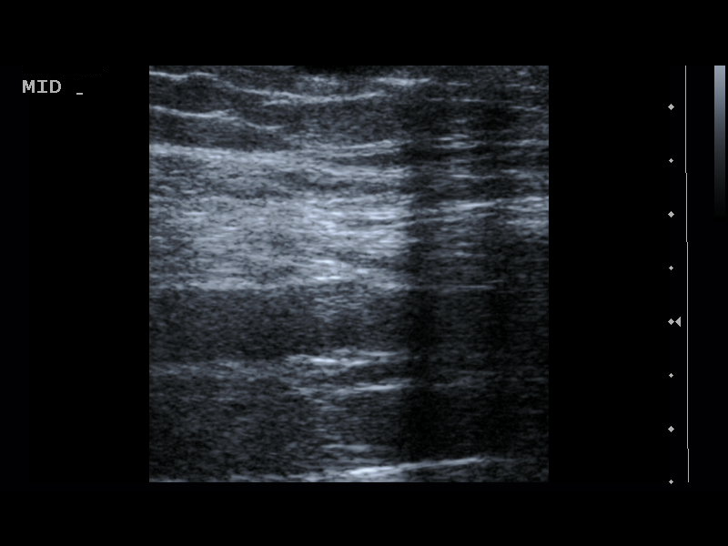
[im 10/14]
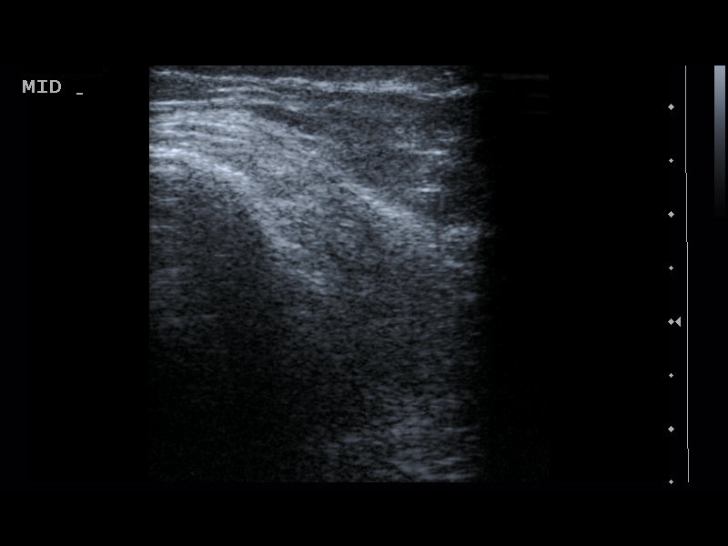
[im 11/14]
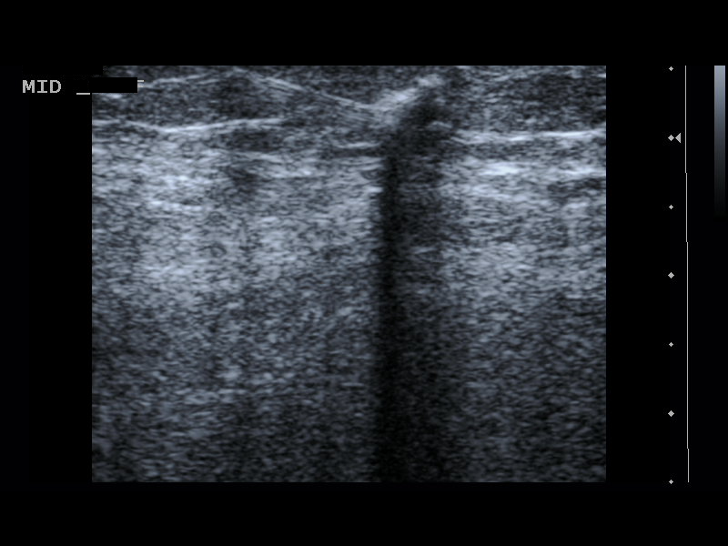
[im 12/14]
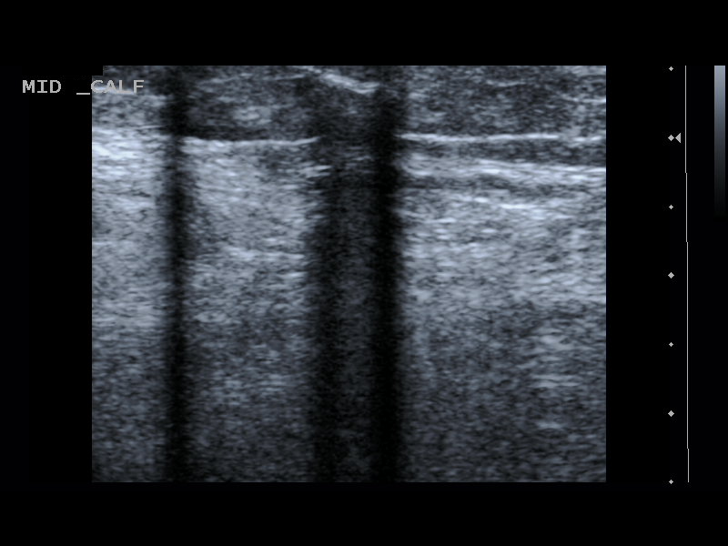
[im 13/14]
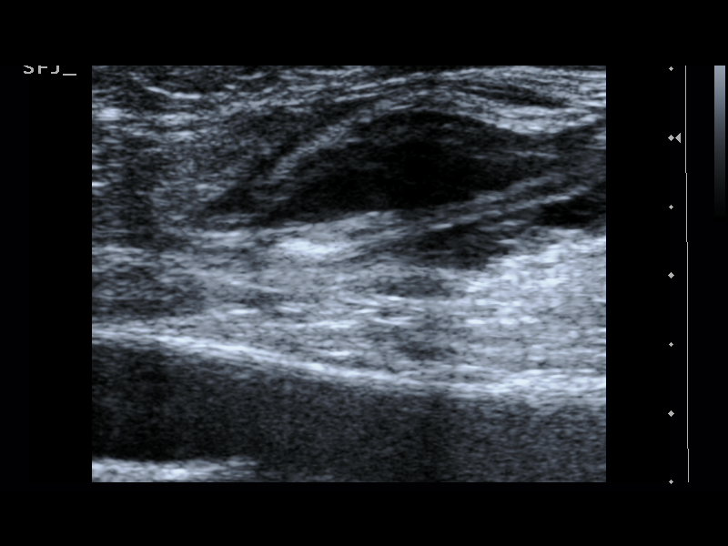
[im 14/14]
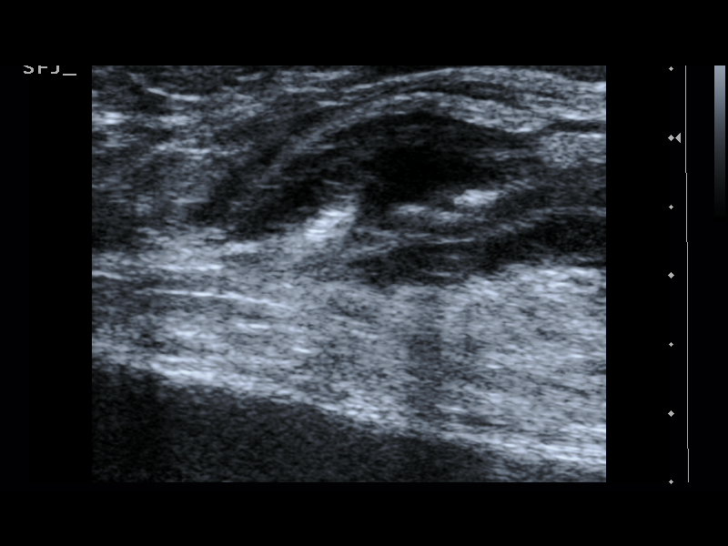

[12 of 24 positions shown; findings below may reference images not displayed]

EXAM:
1. TRANSCATHETER ENDOVENOUS LASER OCCLUSION OF THE GREAT SAPHENOUS
VEIN UNDER ULTRASOUND GUIDANCE
2. ULTRASOUND-GUIDED SCLEROTHERAPY OF VARICOSE VEINS

ANESTHESIA/SEDATION:
5 mg oral Valium.

FLUOROSCOPY TIME:  None

PROCEDURE:
Tumescent: 150 mL diluted Lidocaine.

Laser: 7818 micron, 10 Sheth power

Length of segment treated: 16 cm

Pull back time: 65 seconds

Total energy: 653 Joules

The procedure, risks, benefits, and alternatives were explained to
the patient. Questions regarding the procedure were encouraged and
answered. The patient understands and consents to the procedure.

Initial mapping of the right lower extremity was performed and
course of the great saphenous vein mapped including level of the
saphenofemoral junction and major tributaries supplying
varicosities.

The right lower extremity was prepped with Betadine in a sterile
fashion, and a sterile split drape was applied covering the
operative field. A sterile gown and sterile gloves were used for the
procedure.

Under sonographic guidance, a micropuncture needle was inserted into
the great saphenous vein at the level of the midthigh, which was
noted to be patent. It was removed over and 018 wire which was
upsized to an 035 J- wire utilizing the micropuncture set. This was
advanced across the saphenofemoral junction into the deep venous
system. A 4-French sheath was inserted over the wire to the
saphenofemoral junction. The [employer] 7818 micron laser was
inserted into the sheath, and was positioned with its tip 2.3 cm
from the saphenofemoral junction.

Ultrasound-guided sclerotherapy was performed of varicosities at the
level of the distal thigh, proximal calf, mid calf and distal calf
utilizing foamed 1% AUAD. A total volume of 2.5 mL of AUAD was
utilized.

Tumescent anesthesia was instilled along the great saphenous vein
providing a 1 cm buffer. Prior to laser occlusion, distance from the
tip of the laser fiber to the saphenofemoral junction was
remeasured. Laser mediated occlusion was then performed. The sheath
was then removed. A compressive dressing was applied. A 20-30 mm Hg
graded compression stocking was immediately applied post procedure.
The patient ambulated prior to discharge.

COMPLICATIONS:
None
FINDINGS: A massively dilated proximal great saphenous vein trunk demonstrated
a fairly straight course in the upper thigh. It then merged with
numerous tortuous varicosities at the mid thigh level. Due to prior
GSV stripping and tortuosity of varicosities present, only the upper
segment of the GSV was able to be treated with laser therapy over a
16 cm segment. Prior to laser treatment, the laser fiber distance
from the saphenofemoral junction was documented to be 2.3 cm. Prior
to laser occlusion, sclerotherapy was performed to treat
communicating large varicosities in the thigh and calf. Real-time
ultrasound demonstrated excellent dispersion of sclerosing
throughout the varicosities including to the level of communicating
perforator veins in the calf.
IMPRESSION: Successful endovenous laser occlusion of the right great saphenous
vein to treat symptomatic venous insufficiency. Ultrasound-guided
foam sclerotherapy was also performed of varicosities in the thigh
and calf.

## 2014-04-11 ENCOUNTER — Other Ambulatory Visit (HOSPITAL_COMMUNITY): Payer: Self-pay | Admitting: Interventional Radiology

## 2014-04-11 DIAGNOSIS — I83812 Varicose veins of left lower extremities with pain: Secondary | ICD-10-CM

## 2014-04-24 ENCOUNTER — Other Ambulatory Visit: Payer: Self-pay

## 2014-04-24 ENCOUNTER — Inpatient Hospital Stay: Admission: RE | Admit: 2014-04-24 | Payer: Self-pay | Source: Ambulatory Visit

## 2014-11-12 IMAGING — US US EXTREM LOW VENOUS*L*
1 series · 13 of 24 positions shown · non-contrast
Comparison: Procedural images 10/25/2013; prior venous evaluation
01/11/2013

CLINICAL DATA: 63-year-old male with bilateral symptomatic venous
insufficiency. On 10/25/2013 he underwent trans catheter endovenous
laser occlusion of the left small saphenous vein as well as
ultrasound-guided foam sclerotherapy of left calf varicosities. He
presents today for his scheduled 1 week followup evaluation.

EXAM:
LEFT LOWER EXTREMITY VENOUS DUPLEX ULTRASOUND
TECHNIQUE: Gray-scale sonography with graded compression, as well as color
Doppler and duplex ultrasound, were performed to evaluate the deep
and superficial veins of the lower extremity. Spectral Doppler was
utilized to evaluate flow at rest and with distal augmentation
maneuvers. I personally performed the technical portion of the exam.

[Series 1: us extrem low venous*left* · 13 of 40 slices shown]
[im 1/40]
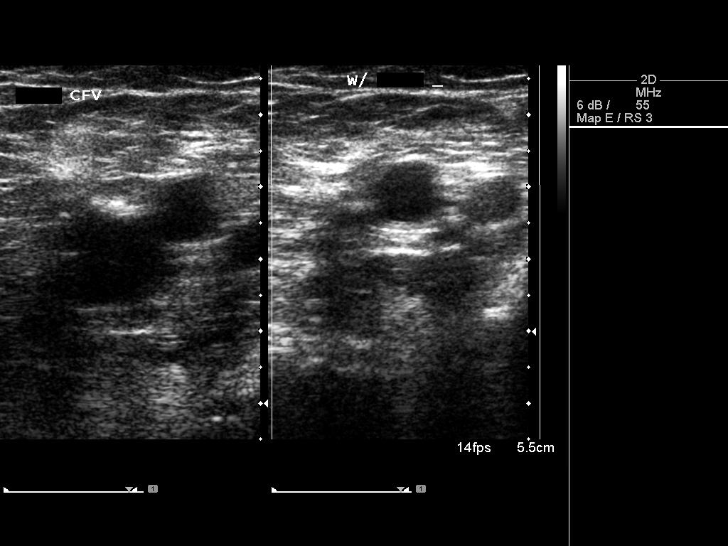
[im 4/40]
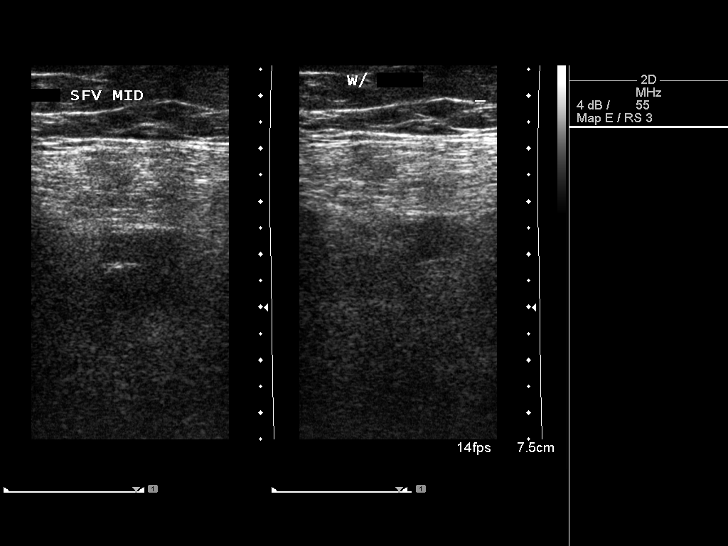
[im 7/40]
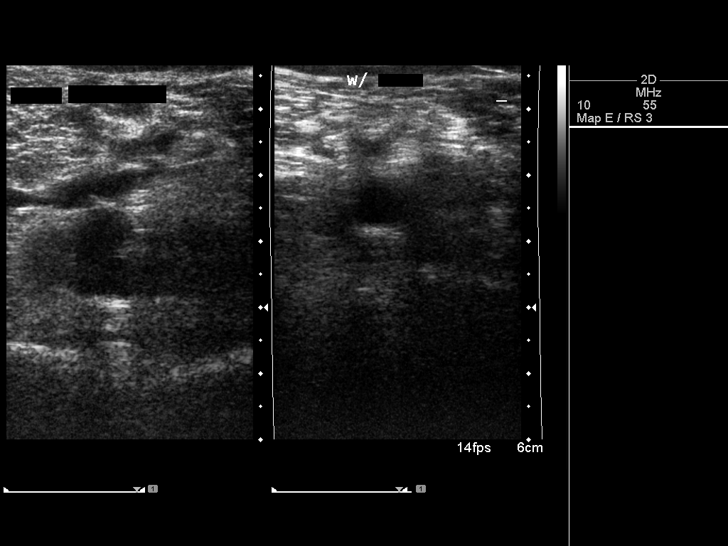
[im 11/40]
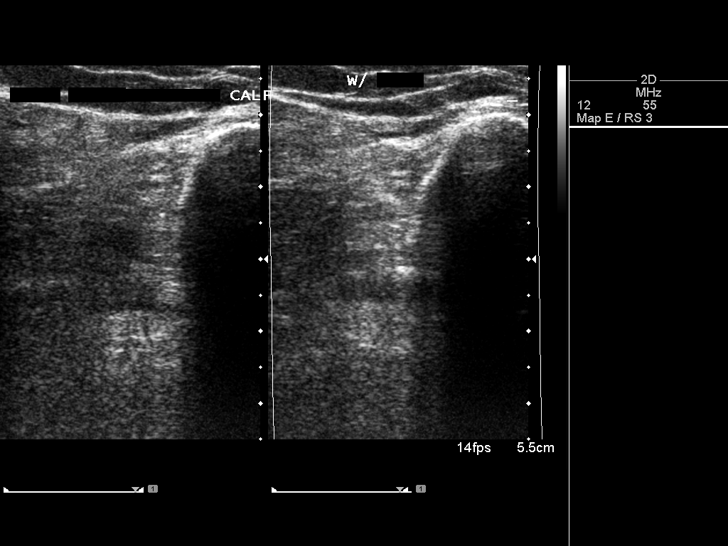
[im 14/40]
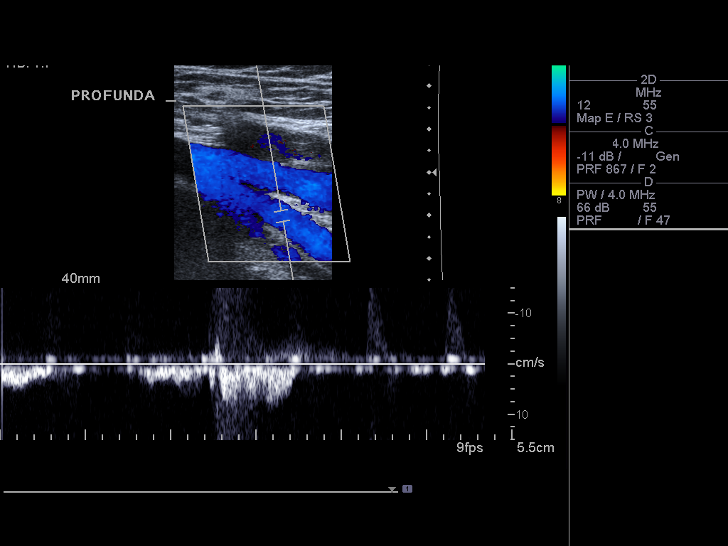
[im 17/40]
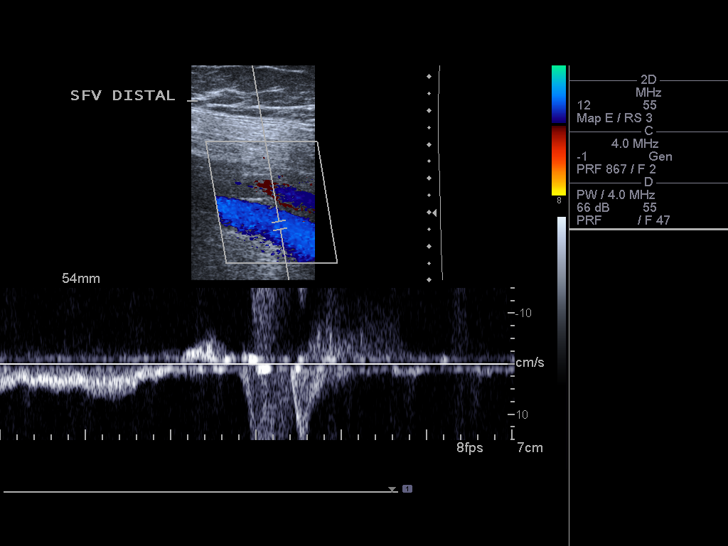
[im 21/40]
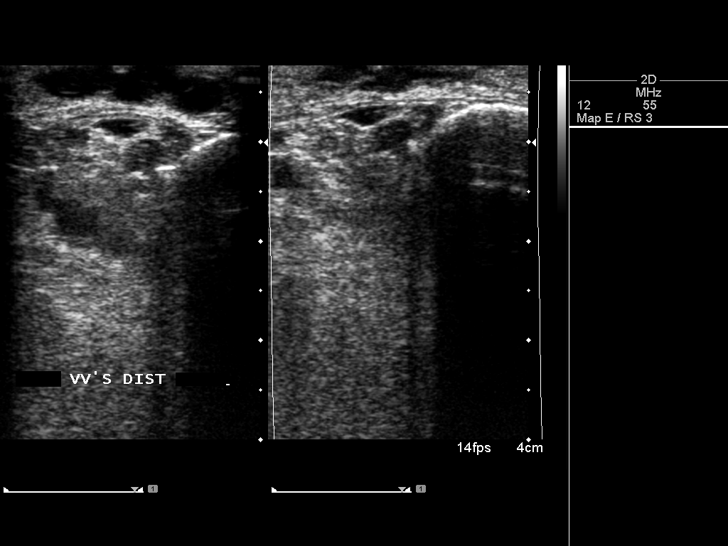
[im 23/40]
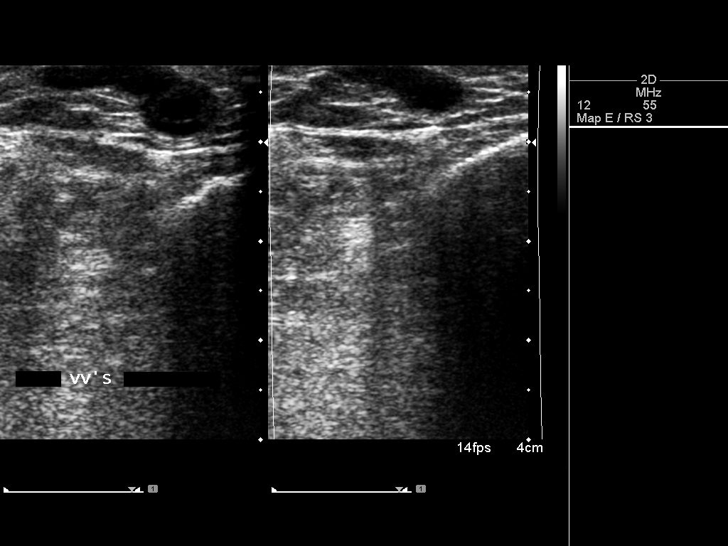
[im 26/40]
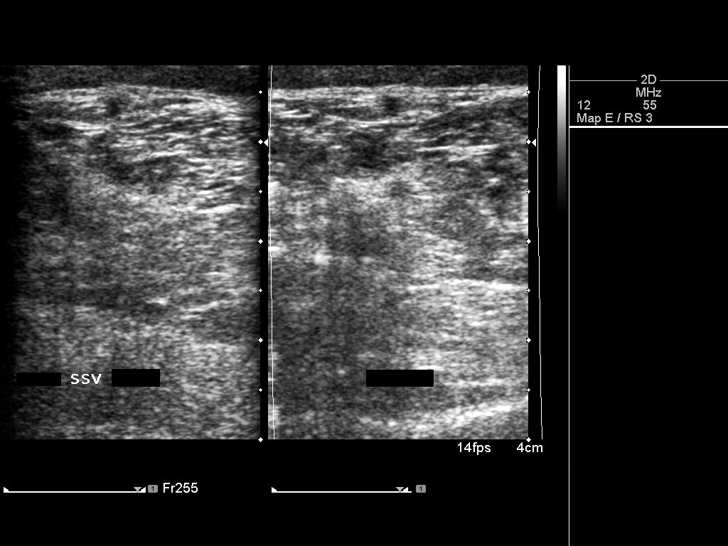
[im 29/40]
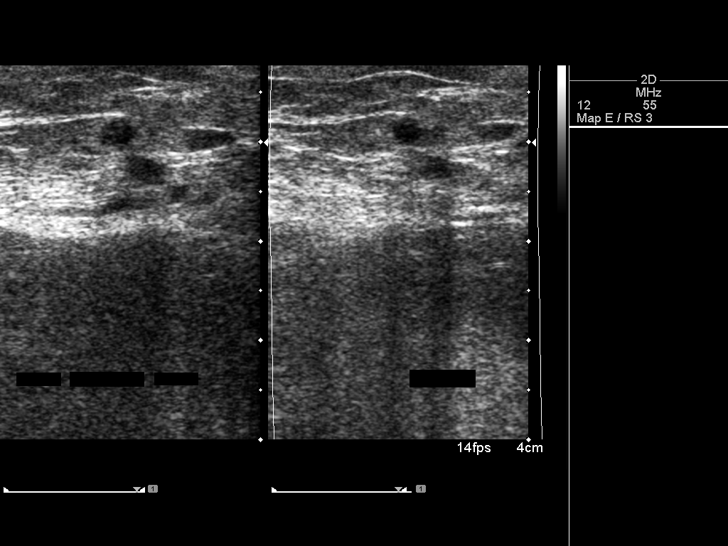
[im 33/40]
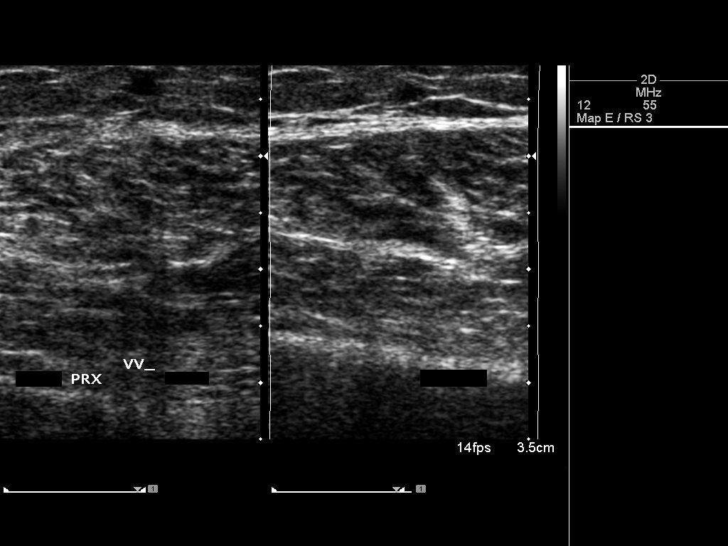
[im 36/40]
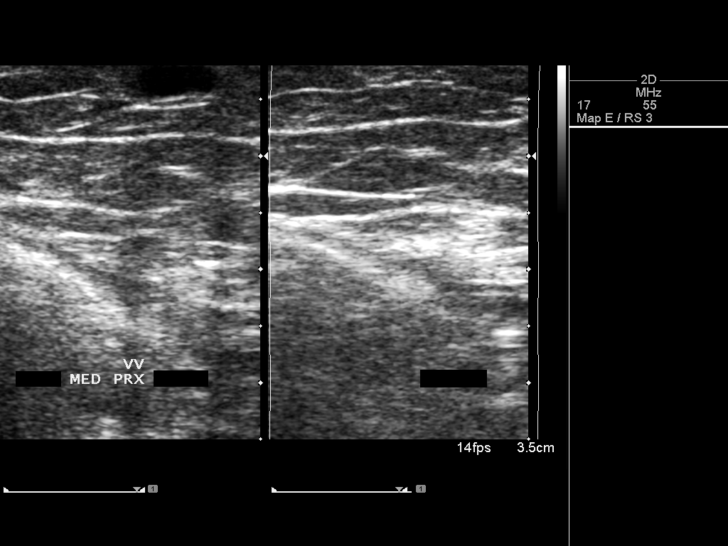
[im 40/40]
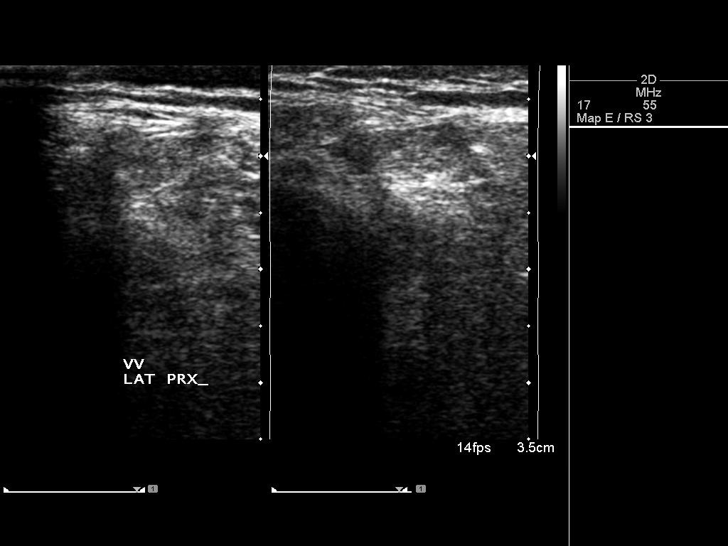

[13 of 24 positions shown; findings below may reference images not displayed]

FINDINGS: DEEP VENOUS SYSTEM: Evaluation of the deep venous system including
the common femoral, femoral, profunda femoral, popliteal and
posterior tibial veins demonstrates no evidence of a deep venous
thrombosis. The vessels remain patent and compressible. Normal
respiratory phases 50 and response to augmentation.

SUPERFICIAL SYSTEM: Successful occlusion of the small saphenous
vein. The previously a treated superficial venous varicosities in
the proximal, mid and distal calf are also successfully thrombosed.
Varicosities in the distal thigh posteriorly remain patent and
compressible. Additionally, there are a few small patent
varicosities medial to the knee and in the proximal calf.
IMPRESSION: 1. No evidence of a deep venous thrombosis.
2. Successful endovenous laser occlusion of the left small saphenous
vein.
3. Successful sclerotherapy occlusion of a multiple superficial
venous varicosities in the proximal, mid and distal calf.
4. Additional superficial venous varicosities in the medial and
posterior distal thigh and at the level of the knee remain patent.
These may be amenable to percutaneous sclerotherapy if clinically
warranted.

## 2014-12-09 IMAGING — US US EXTREM LOW VENOUS*L*
1 series · 13 of 24 positions shown · non-contrast
Comparison: 11/01/2013

CLINICAL DATA: Status post endo venous occlusion of the left short
saphenous vein and sclerotherapy of left lower extremity
varicosities on 10/25/2013.

EXAM:
LEFT LOWER EXTREMITY VENOUS DUPLEX ULTRASOUND
TECHNIQUE: Gray-scale sonography with graded compression, as well as color
Doppler and duplex ultrasound, were performed to evaluate the deep
and superficial veins of the lower extremity. Spectral Doppler was
utilized to evaluate flow at rest and with distal augmentation
maneuvers. I personally performed the technical portion of the exam.

[Series 1: us extrem low venous*left* · 13 of 40 slices shown]
[im 1/40]
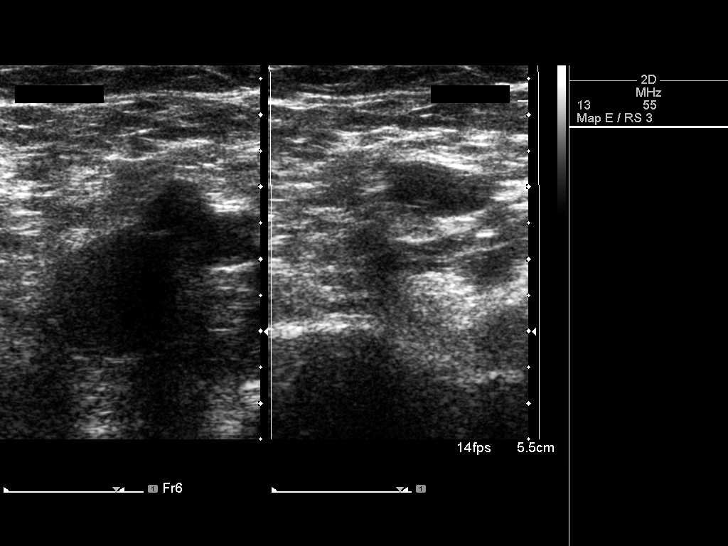
[im 4/40]
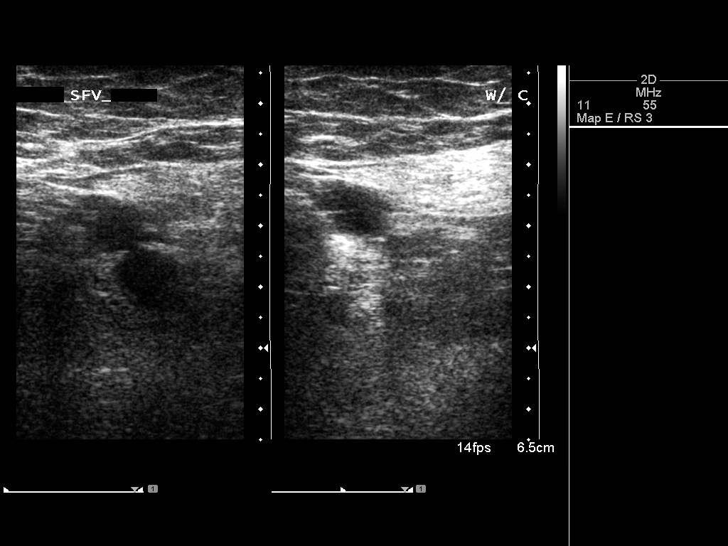
[im 7/40]
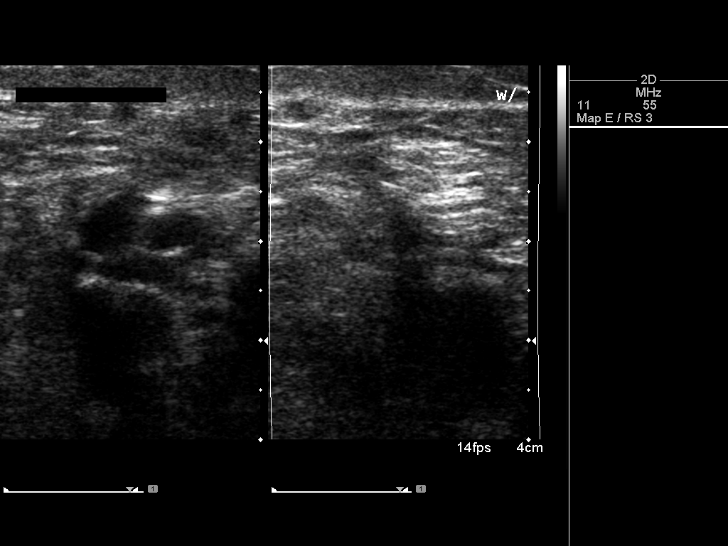
[im 11/40]
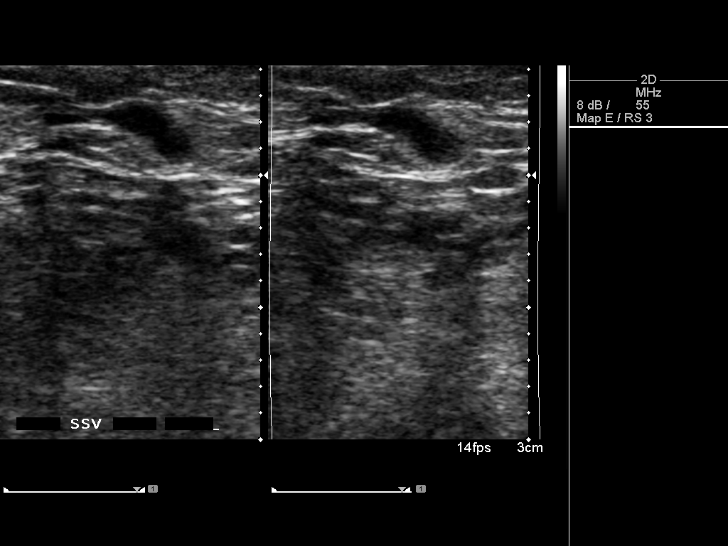
[im 14/40]
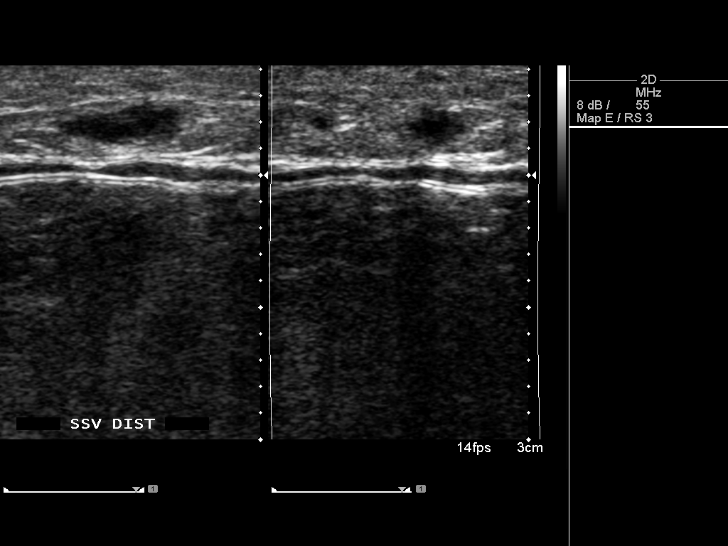
[im 17/40]
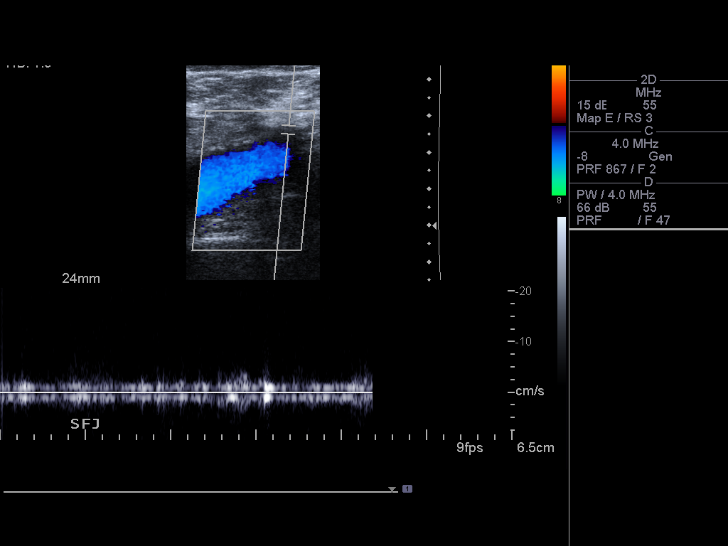
[im 21/40]
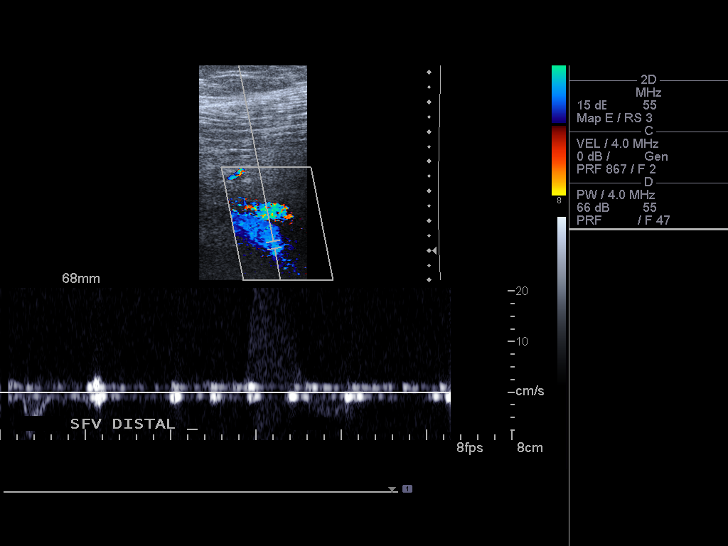
[im 23/40]
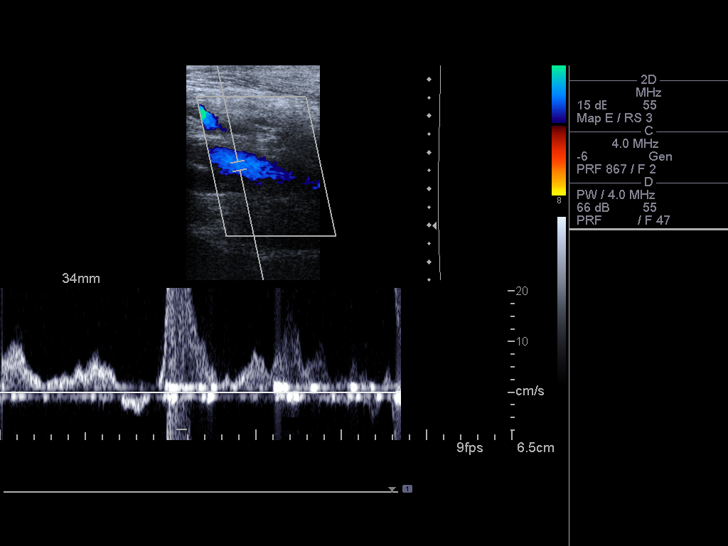
[im 26/40]
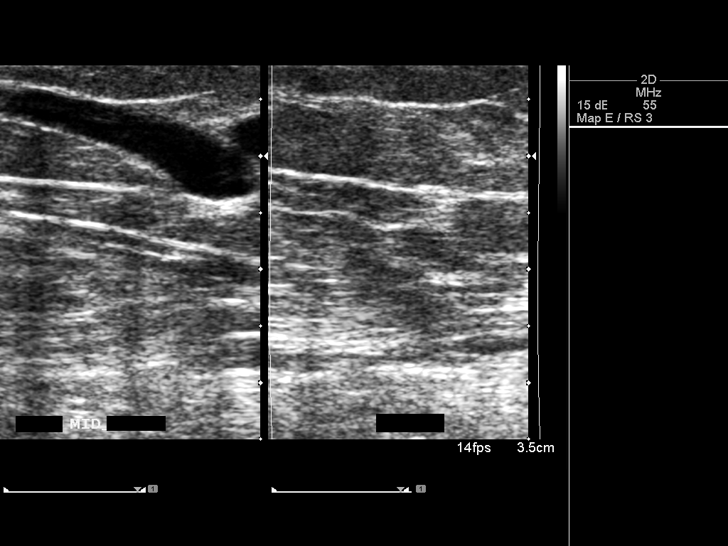
[im 29/40]
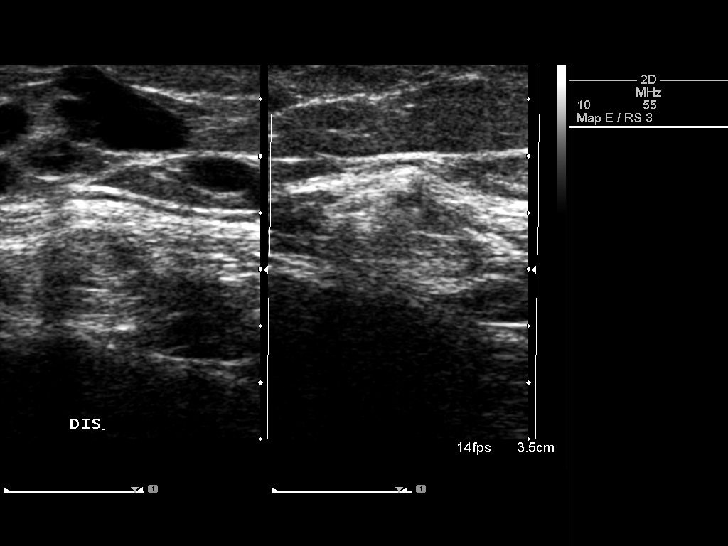
[im 33/40]
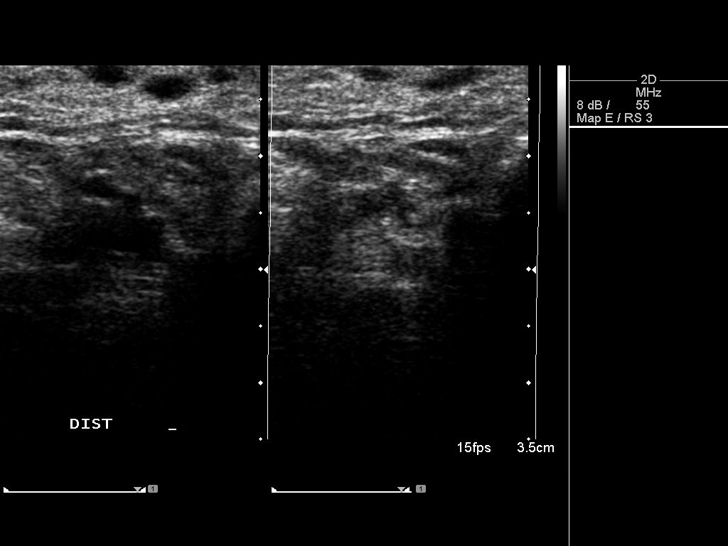
[im 36/40]
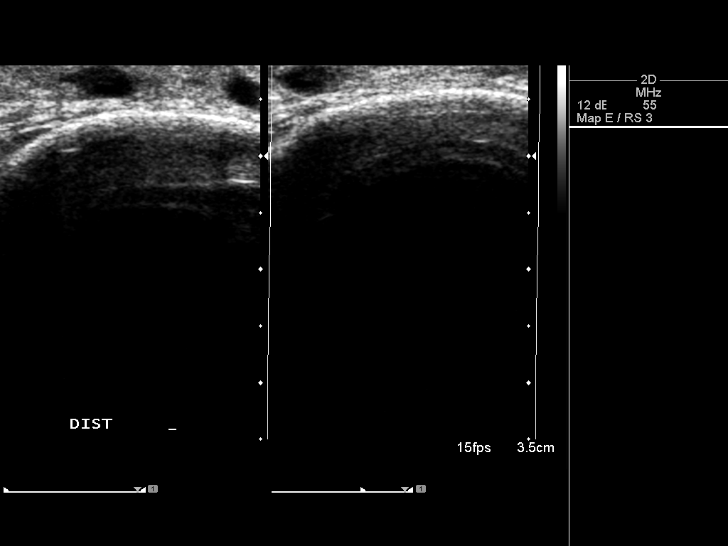
[im 40/40]
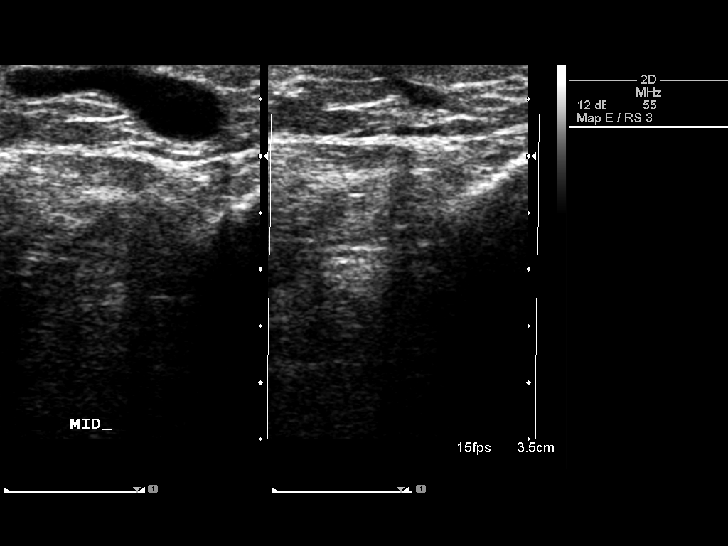

[13 of 24 positions shown; findings below may reference images not displayed]

FINDINGS: The deep venous system of the left lower extremity shows normal
patency without evidence of DVT. The treated short saphenous vein
shows complete occlusion from the level of the laser insertion site
in the distal calf up to the distal posterior thigh. Several calf
varicosities demonstrate complete thrombosis. There are some open
varicosities located in the proximal posterior calf and in the
medial distal thigh. These appear to be supplied primarily by
perforator veins.
IMPRESSION: Occlusion of the left short saphenous vein after laser therapy.
Multiple communicating varicosities also show thrombosis. There are
some open varicosities in the proximal calf and distal thigh which
appear to be supplied primarily by perforator veins.

## 2015-01-22 DIAGNOSIS — R4189 Other symptoms and signs involving cognitive functions and awareness: Secondary | ICD-10-CM | POA: Insufficient documentation

## 2015-01-22 DIAGNOSIS — R4689 Other symptoms and signs involving appearance and behavior: Secondary | ICD-10-CM | POA: Insufficient documentation

## 2015-01-22 DIAGNOSIS — R569 Unspecified convulsions: Secondary | ICD-10-CM | POA: Insufficient documentation

## 2015-01-22 DIAGNOSIS — F1010 Alcohol abuse, uncomplicated: Secondary | ICD-10-CM | POA: Insufficient documentation

## 2015-01-24 ENCOUNTER — Telehealth: Payer: Self-pay | Admitting: Neurology

## 2015-01-24 ENCOUNTER — Other Ambulatory Visit: Payer: Self-pay | Admitting: Neurology

## 2015-01-24 MED ORDER — LEVETIRACETAM 500 MG PO TABS: 500.0000 mg | ORAL_TABLET | Freq: Two times a day (BID) | ORAL | Status: DC

## 2015-01-24 NOTE — Telephone Encounter (Signed)
I spoke to the patient. 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 He did not keep scheduled follow-up appointment with me and discontinued Keppra. He had a seizure recently and went to [facility] where they prescribed Dilantin and Vimpat. Patient has not yet started. He seemed to have had done well on Keppra in the past and hence I recommend he go back on it. I will call in a prescription for 500 mg twice daily. He was asked not to start Dilantin or Vimpat. He was also asked not to drive for next several months as per [facility]

## 2015-01-24 NOTE — Telephone Encounter (Signed)
RN call patient back about medication that the [facility] prescribed him. Pt last saw Dr.Sethi in 2014. Patient wanted on the medication and should he take it. Rn explain Dr.Sethi last saw him in 2014, and did not prescribed the dilantin, and vimpat. Rn explain to patient to look at his discharge summary and follow the medication regimen, and guidelines on what they told him to do. Rn stated him to read over the AVS from [facility] and follow the medication regimen the MD order.

## 2015-01-24 NOTE — Telephone Encounter (Signed)
Patient is calling and would like a returned call. He was seen at [facility] on 11-29 and was kept overnight and was diagnosed with seizures. He was given Rx's for Dilantin 300mg  once daily and Vimpat 100mg  twice daily. The patient wants to discuss these medications before taking them. He has a hospital follow up appointment scheduled on 04-11-15 with Dr. Leonie Man. He was last seen in our office 11-10-12. Thank you.

## 2015-02-07 DIAGNOSIS — J410 Simple chronic bronchitis: Secondary | ICD-10-CM | POA: Insufficient documentation

## 2015-02-07 DIAGNOSIS — H903 Sensorineural hearing loss, bilateral: Secondary | ICD-10-CM | POA: Insufficient documentation

## 2015-02-07 DIAGNOSIS — E1159 Type 2 diabetes mellitus with other circulatory complications: Secondary | ICD-10-CM | POA: Insufficient documentation

## 2015-02-07 DIAGNOSIS — R7301 Impaired fasting glucose: Secondary | ICD-10-CM | POA: Insufficient documentation

## 2015-02-07 DIAGNOSIS — I10 Essential (primary) hypertension: Secondary | ICD-10-CM | POA: Insufficient documentation

## 2015-02-07 DIAGNOSIS — I8393 Asymptomatic varicose veins of bilateral lower extremities: Secondary | ICD-10-CM | POA: Insufficient documentation

## 2015-02-07 DIAGNOSIS — E0590 Thyrotoxicosis, unspecified without thyrotoxic crisis or storm: Secondary | ICD-10-CM | POA: Insufficient documentation

## 2015-02-07 DIAGNOSIS — I152 Hypertension secondary to endocrine disorders: Secondary | ICD-10-CM | POA: Insufficient documentation

## 2015-02-07 DIAGNOSIS — J309 Allergic rhinitis, unspecified: Secondary | ICD-10-CM | POA: Insufficient documentation

## 2015-02-07 DIAGNOSIS — J449 Chronic obstructive pulmonary disease, unspecified: Secondary | ICD-10-CM | POA: Insufficient documentation

## 2015-02-07 DIAGNOSIS — H269 Unspecified cataract: Secondary | ICD-10-CM | POA: Diagnosis present

## 2015-04-11 ENCOUNTER — Encounter: Payer: Self-pay | Admitting: Neurology

## 2015-04-11 ENCOUNTER — Ambulatory Visit (INDEPENDENT_AMBULATORY_CARE_PROVIDER_SITE_OTHER): Payer: Medicare Other | Admitting: Neurology

## 2015-04-11 ENCOUNTER — Ambulatory Visit: Payer: Self-pay | Admitting: Neurology

## 2015-04-11 VITALS — BP 157/89 | HR 71 | Ht 75.0 in | Wt 274.8 lb

## 2015-04-11 DIAGNOSIS — R569 Unspecified convulsions: Secondary | ICD-10-CM | POA: Diagnosis not present

## 2015-04-11 NOTE — Patient Instructions (Signed)
I had a long discussion with the patient and his friend about his recent seizure, personally reviewed imaging studies, lab results discuss seizure provoking triggers and answered questions. I encouraged him to continue on Keppra 500 twice daily but if his dizziness does not improve consider switching over to automatic seizure medications. He was advised to quit alcohol as well as avoid sleep deprivation. He was advised not to drive for 6 months following his seizures as per [employer]. Patient was here for second neurological opinion and he was advised to decide which neurologist he wants to follow-up with in the future. He will think about this and make a decision. Epilepsy People with epilepsy have times when they shake and jerk uncontrollably (seizures). This happens when there is a sudden change in brain function. Epilepsy may have many possible causes. Anything that disturbs the normal pattern of brain cell activity can lead to seizures. HOME CARE   Follow your doctor's instructions about driving and safety during normal activities.  Get enough sleep.  Only take medicine as told by your doctor.  Avoid things that you know can cause you to have seizures (triggers).  Write down when your seizures happen and what you remember about each seizure. Write down anything you think may have caused the seizure to happen.  Tell the people you live and work with that you have seizures. Make sure they know how to help you. They should:  Cushion your head and body.  Turn you on your side.  Not restrain you.  Not place anything inside your mouth.  Call for local emergency medical help if there is any question about what has happened.  Keep all follow-up visits with your doctor. This is very important. GET HELP IF:  You get an infection or start to feel sick. You may have more seizures when you are sick.  You are having seizures more often.  Your seizure pattern is changing. GET HELP  RIGHT AWAY IF:   A seizure does not stop after a few seconds or minutes.  A seizure causes you to have trouble breathing.  A seizure gives you a very bad headache.  A seizure makes you unable to speak or use a part of your body.   This information is not intended to replace advice given to you by your health care provider. Make sure you discuss any questions you have with your health care provider.   Document Released: 12/07/2008 Document Revised: 11/30/2012 Document Reviewed: 09/21/2012 Elsevier Interactive Patient Education [employer].

## 2015-04-11 NOTE — Progress Notes (Signed)
[facility] [address] [place]. [place] [address] [phone]       OFFICE CONSULT NOTE  [name] Date of Birth:  August 24, 1950 Medical Record Number:  [id number]   Referring MD:  None  Reason for Referral:  Second opinion HPI: Michael Whitney is a 65 year old Caucasian male who was recently admitted on 01/22/15 to [facility] with witnessed generalized tonic-clonic seizure in sleep. Patient is unable to provide history which is obtained from his girlfriend who is accompanying him for this visit. Both of them apparently sleeping when the girlfriend woke up and noticed that the patient was having generalized tonic-clonic seizure with making gurgling sounds with eyes being drawn up this lasted around 5 minutes and it stopped. Subsequently disoriented and confused for a while. He was taken In hospital where CT scan of the head was unremarkable. Subsequent MRI scan of the brain was also obtained which showed no acute abnormalities. Patient had EEG done which showed bitemporal slowing and he also had video EEG recording which was unremarkable. Patient was started on Keppra for seizures and subsequently seen by neurologist Dr.Leann Jannifer Franklin in [facility] on 01/28/15 which will continued her medications and felt seizure may have been alcohol-related. Patient categorically denies having had alcohol prior to his seizures. He states that he has a couple of drinks every night but does not binge. The patient in fact had seen me in 2014 for an episode of passing out at that time EEG had shown temporal lobe irritability and I had started him on Keppra which he took for about a year and then decided to stop it. The patient has no history of childhood epilepsy significant head injury with loss of consciousness. Is no family history of epilepsy. Patient has not had any strokes or other neurological problems. He has been tolerating Keppra although he feels it makes him dizzy. However he  admits for the last few weeks since he has started staggering his blood pressure medication with the Keppra 6 not been as bad and he wants to continue. He denies any obvious seizure provoking trigger prior to this episode  ROS:   14 system review of systems is positive for fever, chills, weight gain, fatigue, ringing in the ears, as most, wheezing, snoring, feeling cold, joint pain, dizziness, seizure and all other systems negative  PMH:  Past Medical History  Diagnosis Date  . Hypertension   . Cataract   . Seizures ([place])     Social History:  Social History   Social History  . Marital Status: Single    Spouse Name: N/A  . Number of Children: 2  . Years of Education: N/A   Occupational History  .      Photographer   Social History Main Topics  . Smoking status: Never Smoker   . Smokeless tobacco: Never Used  . Alcohol Use: Yes     Comment: occasionally  . Drug Use: No  . Sexual Activity: Not on file   Other Topics Concern  . Not on file   Social History Narrative    Medications:   Current Outpatient Prescriptions on File Prior to Visit  Medication Sig Dispense Refill  . levETIRAcetam (KEPPRA) 500 MG tablet Take 1 tablet (500 mg total) by mouth 2 (two) times daily. 120 tablet 11  . Meclizine HCl (TRAVEL SICKNESS) 25 MG CHEW Chew 25 mg by mouth.    . Multiple Vitamin (MULTIVITAMIN WITH MINERALS) TABS tablet Take 1 tablet by mouth daily.  No current facility-administered medications on file prior to visit.    Allergies:  No Known Allergies  Physical Exam General: Obese middle-age Caucasian male, seated, in no evident distress Head: head normocephalic and atraumatic.   Neck: supple with no carotid or supraclavicular bruits Cardiovascular: regular rate and rhythm, no murmurs Musculoskeletal: no deformity Skin:  no rash/petichiae Vascular:  Normal pulses all extremities  Neurologic Exam Mental Status: Awake and fully alert. Oriented to place and time. Recent  and remote memory intact. Attention span, concentration and fund of knowledge appropriate. Mood and affect appropriate.  Cranial Nerves: Fundoscopic exam reveals sharp disc margins. Pupils equal, briskly reactive to light. Extraocular movements full without nystagmus. Visual fields full to confrontation. Hearing intact. Facial sensation intact. Face, tongue, palate moves normally and symmetrically.  Motor: Normal bulk and tone. Normal strength in all tested extremity muscles. Sensory.: intact to touch , pinprick , position and vibratory sensation.  Coordination: Rapid alternating movements normal in all extremities. Finger-to-nose and heel-to-shin performed accurately bilaterally. Gait and Station: Arises from chair without difficulty. Stance is normal. Gait demonstrates normal stride length and balance . Able to heel, toe and tandem walk without difficulty.  Reflexes: 1+ and symmetric. Toes downgoing.       ASSESSMENT: 65 year old Caucasian male with witnessed generalized tonic-clonic seizure in sleep in November 2016 with prior history of at least 2 episodes suspicious for seizures. Patient has seen me in the past 4 years ago and had an abnormal EEG at that time and was advised to be on Keppra which he had discontinued.   PLAN: I had a long discussion with the patient and his friend about his recent seizure, personally reviewed imaging studies, lab results discuss seizure provoking triggers and answered questions. I encouraged him to continue on Keppra 500 twice daily but if his dizziness does not improve consider switching over to automatic seizure medications. He was advised to quit alcohol as well as avoid sleep deprivation. He was advised not to drive for 6 months following his seizures as per [employer]. Patient was here for second neurological opinion and he was advised to decide which neurologist he wants to follow-up with in the future. He will think about this and make a decision.  Greater than 50% time during this 45 minute consultation was spent on counseling and coordination of care. Antony Contras, MD Note: This document was prepared with digital dictation and possible smart phrase technology. Any transcriptional errors that result from this process are unintentional.

## 2015-09-06 DIAGNOSIS — Z6835 Body mass index (BMI) 35.0-35.9, adult: Secondary | ICD-10-CM

## 2016-12-04 DIAGNOSIS — M542 Cervicalgia: Secondary | ICD-10-CM | POA: Insufficient documentation

## 2017-03-17 NOTE — Progress Notes (Signed)
Cardiology Office Note   Date:  03/22/2017   ID:  Michael, Whitney 1950/07/20, MRN [id number]  PCP:  Angelique Blonder, [place]  Cardiologist:   Peter [name], MD   Chief Complaint  Patient presents with  . Follow-up  . Headache    Dizzy spell.  . Atrial Fibrillation      History of Present Illness: Michael Whitney is a 67 y.o. male who is seen at the request of Dr. Hoy Morn  for evaluation of dizziness. He has a history of HTN and seizure disorder. He reported a 5 year history of dizziness. In 2014 he had evaluation with Ecg and event monitor that were unremarkable. He does have a history of seizure disorder - he only knows of one seizure and has been on Keppra. He does have a history of hyperthyroidism. Was on Tapazole but this has been discontinued. He has a number of complaints including lightheadedness, fatigue, dizziness, 50 lb weight gain, itchy skin, and runny nose. Some intermitted swelling in right ankle. 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 Feels bloated. Doesn't know why he feels so bad. Is concerned it may be related to lisinopril.     Past Medical History:  Diagnosis Date  . Cataract   . Hypertension   . Seizures ([place])     Past Surgical History:  Procedure Laterality Date  . CATARACT EXTRACTION    . TONSILLECTOMY    . VARICOSE VEIN SURGERY       Current Outpatient Medications  Medication Sig Dispense Refill  . Ascorbic Acid (VITAMIN C PO) Take 1 tablet by mouth daily.    . Cholecalciferol (VITAMIN D3 PO) Take 1 tablet by mouth daily.    Marland Kitchen levETIRAcetam (KEPPRA) 500 MG tablet Take 1 tablet (500 mg total) by mouth 2 (two) times daily. 120 tablet 11  . lisinopril (PRINIVIL,ZESTRIL) 10 MG tablet Take 10 mg by mouth daily.    . Meclizine HCl (TRAVEL SICKNESS) 25 MG CHEW Chew 25 mg by mouth.    . Multiple Vitamin (MULTIVITAMIN WITH MINERALS) TABS tablet Take 1 tablet by mouth daily.    . Omega-3 Fatty Acids (FISH OIL) 1200 MG CAPS Take by mouth.    . thiamine (VITAMIN B-1) 100  MG tablet Take 100 mg by mouth daily.    Marland Kitchen apixaban (ELIQUIS) 5 MG TABS tablet Take 1 tablet (5 mg total) by mouth 2 (two) times daily. 180 tablet 3   No current facility-administered medications for this visit.     Allergies:   Patient has no known allergies.    Social History:  The patient  reports that  has never smoked. he has never used smokeless tobacco. He reports that he drinks about 1.2 oz of alcohol per week. He reports that he does not use drugs.   Family History:  The patient's family history includes CAD in his mother.    ROS:  Please see the history of present illness- diffusely positive.   Otherwise, review of systems are positive for none.   All other systems are reviewed and negative.    PHYSICAL EXAM: VS:  BP (!) 150/60 Comment: Right arm.  Pulse 82   Ht 6\' 3"  (1.905 m)   Wt 285 lb (129.3 kg)   BMI 35.62 kg/m  , BMI Body mass index is 35.62 kg/m. GEN: Well nourished, obese, in no acute distress  HEENT: normal  Neck: no JVD, carotid bruits, or masses Cardiac: IRRR; no murmurs, rubs, or gallops,no edema  Respiratory:  clear to auscultation bilaterally, normal work of breathing GI: soft, nontender, nondistended, + BS MS: no deformity or atrophy  Skin: warm and dry, no rash, varicose veins. Neuro:  Strength and sensation are intact Psych: euthymic mood, full affect   EKG:  EKG is ordered today. The ekg ordered today demonstrates Atrial fibrillation with controlled rate 80. Frequent PVCs versus aberrant beats. Low voltage. I have personally reviewed and interpreted this study.    Recent Labs: No results found for requested labs within last 8760 hours.    Lipid Panel    Component Value Date/Time   CHOL 111 11/04/2012 1125   TRIG 104 11/04/2012 1125   HDL 35 (L) 11/04/2012 1125   CHOLHDL 3.2 11/04/2012 1125   VLDL 21 11/04/2012 1125   LDLCALC 55 11/04/2012 1125      Wt Readings from Last 3 Encounters:  03/22/17 285 lb (129.3 kg)  04/11/15 274 lb  12.8 oz (124.6 kg)  10/25/13 235 lb (106.6 kg)      Other studies Reviewed: Additional studies/ records that were reviewed today include:  Echo 11/04/12: Study Conclusions  Left ventricle: The cavity size was normal. Wall thickness was increased in a pattern of mild LVH. Systolic function was normal. The estimated ejection fraction was in the range of 55% to 60%.       Event monitor 2014- occ isolated PVCs. No Afib.     ASSESSMENT AND PLAN:  1.  Atrial fibrillation duration unknown. This may explain some of his symptoms. Rate controlled on no rate slowing medication. [place] Vasc score of 2. Recommend anticoagulation for reduction of stroke risk. Continue long term. Start Eliquis 5 mg bid. Do not take ASA or NSAIDs. Will obtain Echo. Will follow up in 4 weeks to discuss possibility of DCCV.  2. HTN controlled. Continue lisinopril 3. History of hyperthyroidism. Followed by Endocrinology- Dr. Bernie Covey.    Current medicines are reviewed at length with the patient today.  The patient does not have concerns regarding medicines.  The following changes have been made:  Eliquis 5 mg bid.  Labs/ tests ordered today include:   Orders Placed This Encounter  Procedures  . EKG 12-Lead  . ECHOCARDIOGRAM COMPLETE     Disposition:   FU with me in 4 weeks  Signed, Peter [name], MD  03/22/2017 3:17 PM    [facility] [address] [place], [place], [address] Phone [phone], Fax [phone]

## 2017-03-22 ENCOUNTER — Ambulatory Visit: Payer: Medicare Other | Admitting: Cardiology

## 2017-03-22 ENCOUNTER — Encounter: Payer: Self-pay | Admitting: Cardiology

## 2017-03-22 VITALS — BP 150/60 | HR 82 | Ht 75.0 in | Wt 285.0 lb

## 2017-03-22 DIAGNOSIS — I10 Essential (primary) hypertension: Secondary | ICD-10-CM | POA: Diagnosis not present

## 2017-03-22 DIAGNOSIS — I481 Persistent atrial fibrillation: Secondary | ICD-10-CM | POA: Diagnosis not present

## 2017-03-22 DIAGNOSIS — I4819 Other persistent atrial fibrillation: Secondary | ICD-10-CM

## 2017-03-22 DIAGNOSIS — R42 Dizziness and giddiness: Secondary | ICD-10-CM

## 2017-03-22 MED ORDER — APIXABAN 5 MG PO TABS: 5.0000 mg | ORAL_TABLET | Freq: Two times a day (BID) | ORAL | 3 refills | Status: DC

## 2017-03-22 NOTE — Patient Instructions (Addendum)
Start Eliquis 5 mg twice a day to thin your blood and reduce your risk of stroke  Do not take ASA or NSAIDs like Ibuprofen, Motrin, Aleve.  We will schedule you for an Echocardiogram  I will see you in 4 weeks

## 2017-03-29 ENCOUNTER — Ambulatory Visit (HOSPITAL_COMMUNITY): Payer: Medicare Other | Attending: Cardiovascular Disease

## 2017-03-29 ENCOUNTER — Other Ambulatory Visit: Payer: Self-pay

## 2017-03-29 DIAGNOSIS — I10 Essential (primary) hypertension: Secondary | ICD-10-CM | POA: Insufficient documentation

## 2017-03-29 DIAGNOSIS — I350 Nonrheumatic aortic (valve) stenosis: Secondary | ICD-10-CM | POA: Diagnosis not present

## 2017-03-29 DIAGNOSIS — I481 Persistent atrial fibrillation: Secondary | ICD-10-CM | POA: Diagnosis not present

## 2017-03-29 DIAGNOSIS — R42 Dizziness and giddiness: Secondary | ICD-10-CM | POA: Diagnosis not present

## 2017-03-29 DIAGNOSIS — I071 Rheumatic tricuspid insufficiency: Secondary | ICD-10-CM | POA: Insufficient documentation

## 2017-03-29 DIAGNOSIS — I4819 Other persistent atrial fibrillation: Secondary | ICD-10-CM

## 2017-03-29 LAB — ECHOCARDIOGRAM COMPLETE
AV Area VTI index: 0.67 cm2/m2
AV Area VTI: 1.62 cm2
AV Area mean vel: 1.43 cm2
AV area mean vel ind: 0.56 cm2/m2
AV peak Index: 0.64
AV pk vel: 211 cm/s
AVCELMEANRAT: 0.41
AVG: 9 mmHg
AVPG: 18 mmHg
Ao pk vel: 0.47 m/s
CHL CUP AV VEL: 1.72
CHL CUP DOP CALC LVOT VTI: 19.2 cm
DOP CAL AO MEAN VELOCITY: 140 cm/s
FS: 33 % (ref 28–44)
IVS/LV PW RATIO, ED: 0.87
LA vol index: 43.1 mL/m2
LA vol: 110 mL
LADIAMINDEX: 2.35 cm/m2
LASIZE: 60 mm
LAVOLA4C: 121 mL
LEFT ATRIUM END SYS DIAM: 60 mm
LV PW d: 13.9 mm — AB (ref 0.6–1.1)
LVOT area: 3.46 cm2
LVOT diameter: 21 mm
LVOT peak VTI: 0.5 cm
LVOT peak vel: 98.5 cm/s
LVOTSV: 66 mL
RV sys press: 35 mmHg
Reg peak vel: 281 cm/s
TR max vel: 281 cm/s
VTI: 38.7 cm
Valve area index: 0.67
Valve area: 1.72 cm2

## 2017-03-29 MED ORDER — PERFLUTREN LIPID MICROSPHERE
1.0000 mL | INTRAVENOUS | Status: AC | PRN
  Administered 2017-03-29: 2 mL via INTRAVENOUS

## 2017-04-08 ENCOUNTER — Telehealth: Payer: Self-pay | Admitting: Cardiology

## 2017-04-08 NOTE — Telephone Encounter (Signed)
Returned call to patient.He stated he had a ekg done at neurologist, heart rate 45.Stated he does feel dizzy at times.Pulse has been ranging 40 to 55.Appointment with Dr.Jordan moved up to 04/16/17 at 3:00 pm.Advised to continue to monitor pulse and B/P.I will make Dr.Jordan aware.

## 2017-04-08 NOTE — Telephone Encounter (Signed)
Mr.Gittens is calling because he had a EKG at his neurologist office and was told that his heart rate was at 45 which is  pretty low . He is wanting to know what should he do? Should he come in earlier to see him . Please call

## 2017-04-14 NOTE — Progress Notes (Signed)
Cardiology Office Note   Date:  04/16/2017   ID:  Michael, Whitney Jul 11, 1950, MRN [id number]  PCP:  Angelique Blonder, [place]  Cardiologist:   Jshon Ibe [name], MD   Chief Complaint  Patient presents with  . Follow-up    4 week spost echo.  . Edema  . Shortness of Breath  . Fatigue      History of Present Illness: Michael Whitney is a 67 y.o. male is seen for follow up Atrial fibrillation.  He has a history of HTN and seizure disorder. He reported a 5 year history of dizziness. In 2014 he had evaluation with Ecg and event monitor that were unremarkable. He does have a history of seizure disorder - he only knows of one seizure and has been on Keppra. He does have a history of hyperthyroidism. Was on Tapazole but this has been discontinued. When initially seen he had a number of complaints including lightheadedness, fatigue, dizziness, 50 lb weight gain, itchy skin, and runny nose. Some intermitted swelling in right ankle. 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 Feels bloated. He was found to be in Afib with a controlled rate. Duration unknown. He was started on anticoagulation. Seen today for follow up.  He reports that since his last visit he is feeling much better. His Keppra is being tapered off. His dizziness is much better. Edema has resolved. Weight is coming down. Still not really aware of Afib.     Past Medical History:  Diagnosis Date  . Cataract   . Hypertension   . Seizures ([place])     Past Surgical History:  Procedure Laterality Date  . CATARACT EXTRACTION    . TONSILLECTOMY    . VARICOSE VEIN SURGERY       Current Outpatient Medications  Medication Sig Dispense Refill  . apixaban (ELIQUIS) 5 MG TABS tablet Take 1 tablet (5 mg total) by mouth 2 (two) times daily. 180 tablet 3  . Ascorbic Acid (VITAMIN C PO) Take 1,000 mg by mouth daily.     . Cholecalciferol (VITAMIN D3 PO) Take 25 mcg by mouth daily.     Marland Kitchen levETIRAcetam (KEPPRA) 500 MG tablet Take 1 tablet (500 mg total) by mouth  2 (two) times daily. 120 tablet 11  . lisinopril (PRINIVIL,ZESTRIL) 10 MG tablet Take 10 mg by mouth daily.    . Multiple Vitamin (MULTIVITAMIN WITH MINERALS) TABS tablet Take 1 tablet by mouth daily.    . Omega-3 Fatty Acids (FISH OIL) 1000 MG CAPS Take 1,000 mg by mouth daily.     No current facility-administered medications for this visit.     Allergies:   Patient has no known allergies.    Social History:  The patient  reports that  has never smoked. he has never used smokeless tobacco. He reports that he drinks about 1.2 oz of alcohol per week. He reports that he does not use drugs.   Family History:  The patient's family history includes CAD in his mother.    ROS:  Please see the history of present illness- diffusely positive.   Otherwise, review of systems are positive for none.   All other systems are reviewed and negative.    PHYSICAL EXAM: VS:  BP (!) 130/56   Pulse (!) 54   Ht 6\' 3"  (1.905 m)   Wt 281 lb (127.5 kg)   BMI 35.12 kg/m  , BMI Body mass index is 35.12 kg/m. GENERAL:  Well appearing, obese WM in NAD HEENT:  PERRL, EOMI, sclera are clear. Oropharynx is clear. NECK:  No jugular venous distention, carotid upstroke brisk and symmetric, no bruits, no thyromegaly or adenopathy LUNGS:  Clear to auscultation bilaterally CHEST:  Unremarkable HEART:  IRRR,  PMI not displaced or sustained,S1 and S2 within normal limits, no S3, no S4: no clicks, no rubs, no murmurs ABD:  Soft, nontender. BS +, no masses or bruits. No hepatomegaly, no splenomegaly EXT:  2 + pulses throughout, no edema, no cyanosis no clubbing SKIN:  Warm and dry.  No rashes NEURO:  Alert and oriented x 3. Cranial nerves II through XII intact. PSYCH:  Cognitively intact     EKG:  EKG is not ordered today.   Recent Labs: No results found for requested labs within last 8760 hours.    Lipid Panel    Component Value Date/Time   CHOL 111 11/04/2012 1125   TRIG 104 11/04/2012 1125   HDL 35 (L)  11/04/2012 1125   CHOLHDL 3.2 11/04/2012 1125   VLDL 21 11/04/2012 1125   LDLCALC 55 11/04/2012 1125      Wt Readings from Last 3 Encounters:  04/16/17 281 lb (127.5 kg)  03/22/17 285 lb (129.3 kg)  04/11/15 274 lb 12.8 oz (124.6 kg)    Labs dated 01/11/17: normal UA and CBC. TSH 0.08. Free T4 normal.  Dated 02/19/17; A1c 6%.   Other studies Reviewed: Additional studies/ records that were reviewed today include:   Echo 11/04/12: Study Conclusions  Left ventricle: The cavity size was normal. Wall thickness was increased in a pattern of mild LVH. Systolic function was normal. The estimated ejection fraction was in the range of 55% to 60%.       Event monitor 2014- occ isolated PVCs. No Afib.     Echo 03/29/17: Study Conclusions  - Left ventricle: The cavity size was normal. There was mild   concentric hypertrophy. Systolic function was normal. The   estimated ejection fraction was in the range of 60% to 65%.   Although no diagnostic regional wall motion abnormality was   identified, this possibility cannot be completely excluded on the   basis of this study. - Aortic valve: Moderate focal calcification involving the right   coronary and left coronary cusp. There was very mild stenosis.   Valve area (VTI): 1.72 cm^2. Valve area (Vmax): 1.62 cm^2. Valve   area (Vmean): 1.43 cm^2. - Left atrium: The atrium was severely dilated. - Right atrium: The atrium was moderately to severely dilated. - Atrial septum: No defect or patent foramen ovale was identified. - Pulmonary arteries: Systolic pressure was mildly increased. PA   peak pressure: 35 mm Hg (S).  ASSESSMENT AND PLAN:  1.  Atrial fibrillation duration unknown.  Rate controlled on no rate slowing medication. He is not clearly symptomatic. [place] Vasc score of 2. Recommend anticoagulation for reduction of stroke risk. Continue long term. Start Eliquis 5 mg bid. Do not take ASA or NSAIDs. Echo shows marked biatrial  enlargement. This suggests that his Afib has been of long term duration and also indicates a low success rate for cardioversion and maintaining NSR. At this point he is feeling better. I would recommend continued strategy of rate control and anticoagulation. I don't think he would maintain NSR with DCCV alone. If we were to choose a rhythm control strategy AAD therapy would be needed and even so I think long term maintenance of NSR would be difficult.  2. HTN controlled. Continue lisinopril 3. History of hyperthyroidism. Followed  by Endocrinology- Dr. Bernie Covey.    Current medicines are reviewed at length with the patient today.  The patient does not have concerns regarding medicines.  The following changes have been made:  Eliquis 5 mg bid.  Labs/ tests ordered today include:   No orders of the defined types were placed in this encounter.    Disposition:   FU with me in 2 months.  Signed, Lasaundra Riche [name], MD  04/16/2017 4:47 PM    [facility] [address] [place], [place], [address] Phone [phone], Fax [phone]

## 2017-04-16 ENCOUNTER — Encounter: Payer: Self-pay | Admitting: Cardiology

## 2017-04-16 ENCOUNTER — Ambulatory Visit: Payer: Medicare Other | Admitting: Cardiology

## 2017-04-16 VITALS — BP 130/56 | HR 54 | Ht 75.0 in | Wt 281.0 lb

## 2017-04-16 DIAGNOSIS — I10 Essential (primary) hypertension: Secondary | ICD-10-CM

## 2017-04-16 DIAGNOSIS — I4819 Other persistent atrial fibrillation: Secondary | ICD-10-CM

## 2017-04-16 DIAGNOSIS — I481 Persistent atrial fibrillation: Secondary | ICD-10-CM

## 2017-04-16 NOTE — Patient Instructions (Signed)
Continue your current therapy  I will see you in 2 months.   

## 2017-04-22 ENCOUNTER — Ambulatory Visit: Payer: Medicare Other | Admitting: Cardiology

## 2017-05-31 ENCOUNTER — Telehealth: Payer: Self-pay | Admitting: Cardiology

## 2017-05-31 NOTE — Telephone Encounter (Signed)
Michael Whitney is calling to find out what can he take with his Eliquis .  Please call

## 2017-05-31 NOTE — Telephone Encounter (Signed)
Returned call to patient who c/o headache. He states his tylenol said to ask MD if taking blood thinner. Advise OK to take tylenol as directed, avoid ibuprofen, aleve

## 2017-06-07 ENCOUNTER — Telehealth: Payer: Self-pay | Admitting: Cardiology

## 2017-06-07 DIAGNOSIS — R42 Dizziness and giddiness: Secondary | ICD-10-CM

## 2017-06-07 DIAGNOSIS — I4819 Other persistent atrial fibrillation: Secondary | ICD-10-CM

## 2017-06-07 NOTE — Telephone Encounter (Signed)
Advised patient, verbalized understanding. Order placed in Epic and message sent to schedulers

## 2017-06-07 NOTE — Telephone Encounter (Signed)
New Message:     Pt says he having problems with breathing when he lays down,also had a terrible episode of dizziness on Saturday>Pt is thinking these are some side effects from his Eliquis.

## 2017-06-07 NOTE — Telephone Encounter (Signed)
Spoke with Pt who reports he has been having episodes of feeling light headed and dizzy since starting on Eliquis. Last episode was sat and reports having to call neighbors to assist him to a standing poistion. He denies CP. Initial BP 180/80's with repeat 142/70. Pt also states he has been having to sleep sitting up the past week due to feeling SOB when laying flat. He had some mild left ankle swelling last week but has since resolved.  Routed to Dr. [name] and Pharm D.

## 2017-06-07 NOTE — Telephone Encounter (Signed)
I don't think any of his symptoms are related to Eliquis. I would recommend a 24 hour Holter monitor to make sure Afib rate is Ok. He has had dizziness for years. On last visit symptoms seemed to have improved with lower Keppra dose. May need to check with his Neurologist.  Peter [name] MD, [facility]

## 2017-06-10 NOTE — Telephone Encounter (Signed)
Spoke to patient follow up appointment with Dr.Jordan changed to 07/14/17 at 8:20.Advised to keep 24 hr holter monitor appt 5/1at 11:00 am at [facility] office.

## 2017-06-17 DIAGNOSIS — I4819 Other persistent atrial fibrillation: Secondary | ICD-10-CM | POA: Insufficient documentation

## 2017-06-23 ENCOUNTER — Encounter: Payer: Self-pay | Admitting: Cardiology

## 2017-06-23 ENCOUNTER — Ambulatory Visit: Payer: Medicare Other | Admitting: Cardiology

## 2017-06-23 ENCOUNTER — Ambulatory Visit (INDEPENDENT_AMBULATORY_CARE_PROVIDER_SITE_OTHER): Payer: Medicare Other

## 2017-06-23 DIAGNOSIS — R42 Dizziness and giddiness: Secondary | ICD-10-CM

## 2017-06-23 DIAGNOSIS — I481 Persistent atrial fibrillation: Secondary | ICD-10-CM | POA: Diagnosis not present

## 2017-06-23 DIAGNOSIS — I4819 Other persistent atrial fibrillation: Secondary | ICD-10-CM

## 2017-06-25 ENCOUNTER — Telehealth: Payer: Self-pay | Admitting: *Deleted

## 2017-06-25 DIAGNOSIS — R42 Dizziness and giddiness: Secondary | ICD-10-CM

## 2017-06-25 DIAGNOSIS — I493 Ventricular premature depolarization: Secondary | ICD-10-CM

## 2017-06-25 NOTE — Telephone Encounter (Signed)
Received call from Shelly at [address] who placed monitor on patient yesterday 5/2 and stated patient was very dizzy yesterday while placing the monitor.   She had [facility] pull the report this morning and the report was concerning.  His PVC burden was 33%, min HR 31, 254 pauses > 68ms, longest pause 3 sec.   She request DOD review and advise.    Reviewed with DOD, Dr. Sallyanne Kuster who recommended EP referral.     Spoke to patient, he continues to experience dizziness but at random times.   Has not passed out.   He was mowing the lawn this morning and states some dizziness may be coming from his allergies.  He is aware of the recommendations per Dr. Sallyanne Kuster and verbalized understanding.    Will place referral and send message to scheduler.   Routed to Dr. [name] to make aware.   Report in epic.

## 2017-06-25 NOTE — Telephone Encounter (Signed)
Agree with referral to EP. Afib rate is slow on no rate slowing meds and frequent PVCs.  Peter [name] MD, [facility]

## 2017-06-29 ENCOUNTER — Encounter: Payer: Self-pay | Admitting: *Deleted

## 2017-06-29 ENCOUNTER — Encounter: Payer: Self-pay | Admitting: Internal Medicine

## 2017-06-29 ENCOUNTER — Ambulatory Visit: Payer: Medicare Other | Admitting: Internal Medicine

## 2017-06-29 VITALS — BP 134/92 | HR 93 | Ht 75.0 in | Wt 287.0 lb

## 2017-06-29 DIAGNOSIS — I481 Persistent atrial fibrillation: Secondary | ICD-10-CM

## 2017-06-29 DIAGNOSIS — E119 Type 2 diabetes mellitus without complications: Secondary | ICD-10-CM | POA: Insufficient documentation

## 2017-06-29 DIAGNOSIS — E118 Type 2 diabetes mellitus with unspecified complications: Secondary | ICD-10-CM | POA: Insufficient documentation

## 2017-06-29 DIAGNOSIS — I493 Ventricular premature depolarization: Secondary | ICD-10-CM | POA: Diagnosis not present

## 2017-06-29 DIAGNOSIS — I4819 Other persistent atrial fibrillation: Secondary | ICD-10-CM

## 2017-06-29 DIAGNOSIS — R42 Dizziness and giddiness: Secondary | ICD-10-CM | POA: Diagnosis not present

## 2017-06-29 DIAGNOSIS — I10 Essential (primary) hypertension: Secondary | ICD-10-CM

## 2017-06-29 NOTE — Patient Instructions (Addendum)
Medication Instructions:  Your physician recommends that you continue on your current medications as directed. Please refer to the Current Medication list given to you today.  Labwork: None ordered.  Testing/Procedures: Your physician has recommended that you have an ablation. Catheter ablation is a medical procedure used to treat some cardiac arrhythmias (irregular heartbeats). During catheter ablation, a long, thin, flexible tube is put into a blood vessel in your groin (upper thigh), or neck. This tube is called an ablation catheter. It is then guided to your heart through the blood vessel. Radio frequency waves destroy small areas of heart tissue where abnormal heartbeats may cause an arrhythmia to start. Please see the instruction sheet given to you today.  PVC or Premature ventricular contraction ablation  Follow-Up:  The following days are available for procedures:  Ablation days: May 9, 22, 24 June 3, 26  If you decide on a day please give me a call:  Bing Neighbors [phone]  Any Other Special Instructions Will Be Listed Below (If Applicable).   Premature Ventricular Contraction A premature ventricular contraction (PVC) is a common irregularity in the normal heart rhythm. These contractions are extra heartbeats that start in the heart ventricles and occur too early in the normal sequence. During the PVC, the heart's normal electrical pathway is not used, so the beat is shorter and less effective. In most cases, these contractions come and go and do not require treatment. What are the causes? In many cases, the cause may not be known. Common causes of the condition include:  Smoking.  Drinking alcohol.  Caffeine.  Certain medicines.  Some illegal drugs.  Stress.  Certain medical conditions can also cause PVCs:  Changes in minerals in the blood (electrolytes).  Heart failure.  Heart valve problems.  Low blood oxygen levels or high carbon dioxide levels.  Heart  attack, or coronary artery disease.  What are the signs or symptoms? The main symptom of this condition is a fast or skipped heartbeat (palpitations). Other symptoms include:  Chest pain.  Shortness of breath.  Feeling tired.  Dizziness.  In some cases, there are no symptoms. How is this diagnosed? This condition may be diagnosed based on:  Your medical history.  A physical exam. During the exam, the health care provider will check for irregular heartbeats.  Tests, such as: ? An ECG (electrocardiogram) to monitor the electrical activity of your heart. ? Holter monitor testing. This involves wearing a device that clips to your clothing and monitors the electrical activity of your heart over longer periods of time. ? Stress tests to see how exercise affects your heart rhythm and blood supply. ? Echocardiogram. This test uses sound waves (ultrasound) to produce an image of your heart. ? Electrophysiology study. This test checks the electric pathways in your heart.  How is this treated? Treatment depends on any underlying conditions, the type of PVCs that you are having, and how much the symptoms are interfering with your daily life. Possible treatments include:  Avoiding things that can trigger the premature contractions, such as caffeine or alcohol.  Medicines. These may be given if symptoms are severe or if the extra heartbeats are frequent.  Treatment for any underlying condition that is found to be the cause of the contractions.  Catheter ablation. This procedure destroys the heart tissues that send abnormal signals.  In some cases, no treatment is required. Follow these instructions at home: Lifestyle Follow these instructions as told by your health care provider:  Do not use  any products that contain nicotine or tobacco, such as cigarettes and e-cigarettes. If you need help quitting, ask your health care provider.  If caffeine triggers episodes of PVC, do not eat,  drink, or use anything with caffeine in it.  If caffeine does not seem to trigger episodes, consume caffeine in moderation.  If alcohol triggers episodes of PVC, do not drink alcohol.  If alcohol does not seem to trigger episodes, limit alcohol intake to no more than 1 drink a day for nonpregnant women and 2 drinks a day for men. One drink equals 12 oz of beer, 5 oz of wine, or 1 oz of hard liquor.  Exercise regularly. Ask your health care provider what type of exercise is safe for you.  Find healthy ways to manage stress. Avoid stressful situations when possible.  Try to get at least 7-9 hours of sleep each night, or as much as recommended by your health care provider.  Do not use illegal drugs.  General instructions  Take over-the-counter and prescription medicines only as told by your health care provider.  Keep all follow-up visits as told by your health care provider. This is important. Get help right away if:  You feel palpitations that are frequent or continual.  You have chest pain.  You have shortness of breath.  You have sweating for no reason.  You have nausea and vomiting.  You become light-headed or you faint. This information is not intended to replace advice given to you by your health care provider. Make sure you discuss any questions you have with your health care provider. Document Released: 09/27/2003 Document Revised: 10/04/2015 Document Reviewed: 07/17/2015 Elsevier Interactive Patient Education  2018 [employer].    Cardiac Ablation Cardiac ablation is a procedure to disable (ablate) a small amount of heart tissue in very specific places. The heart has many electrical connections. Sometimes these connections are abnormal and can cause the heart to beat very fast or irregularly. Ablating some of the problem areas can improve the heart rhythm or return it to normal. Ablation may be done for people who:  Have Wolff-Parkinson-White syndrome.  Have  fast heart rhythms (tachycardia).  Have taken medicines for an abnormal heart rhythm (arrhythmia) that were not effective or caused side effects.  Have a high-risk heartbeat that may be life-threatening.  During the procedure, a small incision is made in the neck or the groin, and a long, thin, flexible tube (catheter) is inserted into the incision and moved to the heart. Small devices (electrodes) on the tip of the catheter will send out electrical currents. A type of X-ray (fluoroscopy) will be used to help guide the catheter and to provide images of the heart. Tell a health care provider about:  Any allergies you have.  All medicines you are taking, including vitamins, herbs, eye drops, creams, and over-the-counter medicines.  Any problems you or family members have had with anesthetic medicines.  Any blood disorders you have.  Any surgeries you have had.  Any medical conditions you have, such as kidney failure.  Whether you are pregnant or may be pregnant. What are the risks? Generally, this is a safe procedure. However, problems may occur, including:  Infection.  Bruising and bleeding at the catheter insertion site.  Bleeding into the chest, especially into the sac that surrounds the heart. This is a serious complication.  Stroke or blood clots.  Damage to other structures or organs.  Allergic reaction to medicines or dyes.  Need for a permanent  pacemaker if the normal electrical system is damaged. A pacemaker is a small computer that sends electrical signals to the heart and helps your heart beat normally.  The procedure not being fully effective. This may not be recognized until months later. Repeat ablation procedures are sometimes required.  What happens before the procedure?  Follow instructions from your health care provider about eating or drinking restrictions.  Ask your health care provider about: ? Changing or stopping your regular medicines. This is  especially important if you are taking diabetes medicines or blood thinners. ? Taking medicines such as aspirin and ibuprofen. These medicines can thin your blood. Do not take these medicines before your procedure if your health care provider instructs you not to.  Plan to have someone take you home from the hospital or clinic.  If you will be going home right after the procedure, plan to have someone with you for 24 hours. What happens during the procedure?  To lower your risk of infection: ? Your health care team will wash or sanitize their hands. ? Your skin will be washed with soap. ? Hair may be removed from the incision area.  An IV tube will be inserted into one of your veins.  You will be given a medicine to help you relax (sedative).  The skin on your neck or groin will be numbed.  An incision will be made in your neck or your groin.  A needle will be inserted through the incision and into a large vein in your neck or groin.  A catheter will be inserted into the needle and moved to your heart.  Dye may be injected through the catheter to help your surgeon see the area of the heart that needs treatment.  Electrical currents will be sent from the catheter to ablate heart tissue in desired areas. There are three types of energy that may be used to ablate heart tissue: ? Heat (radiofrequency energy). ? Laser energy. ? Extreme cold (cryoablation).  When the necessary tissue has been ablated, the catheter will be removed.  Pressure will be held on the catheter insertion area to prevent excessive bleeding.  A bandage (dressing) will be placed over the catheter insertion area. The procedure may vary among health care providers and hospitals. What happens after the procedure?  Your blood pressure, heart rate, breathing rate, and blood oxygen level will be monitored until the medicines you were given have worn off.  Your catheter insertion area will be monitored for bleeding.  You will need to lie still for a few hours to ensure that you do not bleed from the catheter insertion area.  Do not drive for 24 hours or as long as directed by your health care provider. Summary  Cardiac ablation is a procedure to disable (ablate) a small amount of heart tissue in very specific places. Ablating some of the problem areas can improve the heart rhythm or return it to normal.  During the procedure, electrical currents will be sent from the catheter to ablate heart tissue in desired areas. This information is not intended to replace advice given to you by your health care provider. Make sure you discuss any questions you have with your health care provider. Document Released: 06/28/2008 Document Revised: 12/30/2015 Document Reviewed: 12/30/2015 Elsevier Interactive Patient Education  [employer].   If you need a refill on your cardiac medications before your next appointment, please call your pharmacy.

## 2017-06-30 ENCOUNTER — Telehealth: Payer: Self-pay | Admitting: Internal Medicine

## 2017-06-30 DIAGNOSIS — R42 Dizziness and giddiness: Secondary | ICD-10-CM

## 2017-06-30 DIAGNOSIS — I493 Ventricular premature depolarization: Secondary | ICD-10-CM

## 2017-06-30 NOTE — Telephone Encounter (Signed)
Spoke to patient advised Dr.Jordan agreed with referral to EP.Stated he saw Dr.Taylor yesterday,ablation is planned.Appointment rescheduled with Dr.Jordan to 10/21/17 at 10:40 am.

## 2017-06-30 NOTE — Telephone Encounter (Signed)
New Message    Patient is calling to schedule your recommend surgery. He is interested in June 3rd.

## 2017-06-30 NOTE — Telephone Encounter (Signed)
Call received from Pt.  Returned call.   Pt would like to schedule PVC ablation for July 26, 2017 at 7:30 am. Pt will come in for lab work and instruction letter Jul 16, 2017. No further action needed.

## 2017-07-01 ENCOUNTER — Encounter: Payer: Self-pay | Admitting: Internal Medicine

## 2017-07-01 NOTE — H&P (View-Only) (Signed)
HPI Mr. Campillo is referred today by Dr. [name] for evaluation of PVC's. He is a pleasant 67 yo man who has had problems with severe fatigue. He has never had syncope. He denies anginal symptoms. He has a h/o thyroid dysfunction and is on methimazole. Allergies  Allergen Reactions  . Amlodipine     Other reaction(s): Other (See Comments) dizziness     Current Outpatient Medications  Medication Sig Dispense Refill  . apixaban (ELIQUIS) 5 MG TABS tablet Take 1 tablet (5 mg total) by mouth 2 (two) times daily. 180 tablet 3  . Ascorbic Acid (VITAMIN C PO) Take 1,000 mg by mouth daily.     . Cholecalciferol (VITAMIN D3 PO) Take 25 mcg by mouth daily.     . fluticasone (FLONASE) 50 MCG/ACT nasal spray Place 1 spray into the nose daily.    Marland Kitchen lisinopril (PRINIVIL,ZESTRIL) 10 MG tablet Take 10 mg by mouth daily.    Marland Kitchen loratadine (CLARITIN) 10 MG tablet Take 10 mg by mouth daily.    . methimazole (TAPAZOLE) 5 MG tablet Take 5 mg by mouth daily.  2  . Multiple Vitamin (MULTIVITAMIN WITH MINERALS) TABS tablet Take 1 tablet by mouth daily.    . Omega-3 Fatty Acids (FISH OIL) 1000 MG CAPS Take 1,000 mg by mouth daily.    Marland Kitchen thiamine (VITAMIN B-1) 50 MG tablet Take 50 mg by mouth daily.     No current facility-administered medications for this visit.      Past Medical History:  Diagnosis Date  . Cataract   . Hypertension   . Seizures ([place])     ROS:   All systems reviewed and negative except as noted in the HPI.   Past Surgical History:  Procedure Laterality Date  . CATARACT EXTRACTION    . TONSILLECTOMY    . VARICOSE VEIN SURGERY       Family History  Problem Relation Age of Onset  . CAD Mother        MI at age 58     Social History   Socioeconomic History  . Marital status: Widowed    Spouse name: Not on file  . Number of children: 2  . Years of education: Not on file  . Highest education level: Not on file  Occupational History  . Occupation: retired   Comment: Actuary  . Financial resource strain: Not on file  . Food insecurity:    Worry: Not on file    Inability: Not on file  . Transportation needs:    Medical: Not on file    Non-medical: Not on file  Tobacco Use  . Smoking status: Never Smoker  . Smokeless tobacco: Never Used  Substance and Sexual Activity  . Alcohol use: Yes    Alcohol/week: 1.2 oz    Types: 1 Shots of liquor, 1 Cans of beer per week    Comment: occasionally  . Drug use: No  . Sexual activity: Not on file  Lifestyle  . Physical activity:    Days per week: Not on file    Minutes per session: Not on file  . Stress: Not on file  Relationships  . Social connections:    Talks on phone: Not on file    Gets together: Not on file    Attends religious service: Not on file    Active member of club or organization: Not on file    Attends meetings of clubs or organizations: Not on  file    Relationship status: Not on file  . Intimate partner violence:    Fear of current or ex partner: Not on file    Emotionally abused: Not on file    Physically abused: Not on file    Forced sexual activity: Not on file  Other Topics Concern  . Not on file  Social History Narrative  . Not on file     BP (!) 134/92   Pulse 93   Ht 6\' 3"  (1.905 m)   Wt 287 lb (130.2 kg)   BMI 35.87 kg/m   Physical Exam:  Well appearing 67 yo man, NAD HEENT: Unremarkable Neck:  6 cm JVD, no thyromegally Lymphatics:  No adenopathy Back:  No CVA tenderness Lungs:  Clear with no wheezes HEART:  IRegular rate rhythm, no murmurs, no rubs, no clicks Abd:  soft, positive bowel sounds, no organomegally, no rebound, no guarding Ext:  2 plus pulses, no edema, no cyanosis, no clubbing Skin:  No rashes no nodules Neuro:  CN II through XII intact, motor grossly intact  EKG - atrial fib with frequent PVC's   Assess/Plan: 1. 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 I have discussed the treatment options with the  patient. He is not a good candidate for AA drug therapy due to his multiple comorbidities. He does not have palpitations but feels poorly. I have discussed the indications/risks/benefits/goals/expectations of EP study and ablation of his PVC's which are left bundle morphology transitioning from negative to positive at lead V4. He will call us if he wishes to proceed. 2. HTN - his blood pressure is controlled. Will follow. 3. COPD - he is not a good candidate for Amio therapy.   Mikle Bosworth.D.

## 2017-07-01 NOTE — Progress Notes (Signed)
HPI [name] is referred today by Dr. [name] for evaluation of PVC's. He is a pleasant 67 yo man who has had problems with severe fatigue. He has never had syncope. He denies anginal symptoms. He has a h/o thyroid dysfunction and is on methimazole. Allergies  Allergen Reactions  . Amlodipine     Other reaction(s): Other (See Comments) dizziness     Current Outpatient Medications  Medication Sig Dispense Refill  . apixaban (ELIQUIS) 5 MG TABS tablet Take 1 tablet (5 mg total) by mouth 2 (two) times daily. 180 tablet 3  . Ascorbic Acid (VITAMIN C PO) Take 1,000 mg by mouth daily.     . Cholecalciferol (VITAMIN D3 PO) Take 25 mcg by mouth daily.     . fluticasone (FLONASE) 50 MCG/ACT nasal spray Place 1 spray into the nose daily.    Marland Kitchen lisinopril (PRINIVIL,ZESTRIL) 10 MG tablet Take 10 mg by mouth daily.    Marland Kitchen loratadine (CLARITIN) 10 MG tablet Take 10 mg by mouth daily.    . methimazole (TAPAZOLE) 5 MG tablet Take 5 mg by mouth daily.  2  . Multiple Vitamin (MULTIVITAMIN WITH MINERALS) TABS tablet Take 1 tablet by mouth daily.    . Omega-3 Fatty Acids (FISH OIL) 1000 MG CAPS Take 1,000 mg by mouth daily.    Marland Kitchen thiamine (VITAMIN B-1) 50 MG tablet Take 50 mg by mouth daily.     No current facility-administered medications for this visit.      Past Medical History:  Diagnosis Date  . Cataract   . Hypertension   . Seizures ([place])     ROS:   All systems reviewed and negative except as noted in the HPI.   Past Surgical History:  Procedure Laterality Date  . CATARACT EXTRACTION    . TONSILLECTOMY    . VARICOSE VEIN SURGERY       Family History  Problem Relation Age of Onset  . CAD Mother        MI at age 37     Social History   Socioeconomic History  . Marital status: Widowed    Spouse name: Not on file  . Number of children: 2  . Years of education: Not on file  . Highest education level: Not on file  Occupational History  . Occupation: retired   Comment: Actuary  . Financial resource strain: Not on file  . Food insecurity:    Worry: Not on file    Inability: Not on file  . Transportation needs:    Medical: Not on file    Non-medical: Not on file  Tobacco Use  . Smoking status: Never Smoker  . Smokeless tobacco: Never Used  Substance and Sexual Activity  . Alcohol use: Yes    Alcohol/week: 1.2 oz    Types: 1 Shots of liquor, 1 Cans of beer per week    Comment: occasionally  . Drug use: No  . Sexual activity: Not on file  Lifestyle  . Physical activity:    Days per week: Not on file    Minutes per session: Not on file  . Stress: Not on file  Relationships  . Social connections:    Talks on phone: Not on file    Gets together: Not on file    Attends religious service: Not on file    Active member of club or organization: Not on file    Attends meetings of clubs or organizations: Not on  file    Relationship status: Not on file  . Intimate partner violence:    Fear of current or ex partner: Not on file    Emotionally abused: Not on file    Physically abused: Not on file    Forced sexual activity: Not on file  Other Topics Concern  . Not on file  Social History Narrative  . Not on file     BP (!) 134/92   Pulse 93   Ht 6\' 3"  (1.905 m)   Wt 287 lb (130.2 kg)   BMI 35.87 kg/m   Physical Exam:  Well appearing 67 yo man, NAD HEENT: Unremarkable Neck:  6 cm JVD, no thyromegally Lymphatics:  No adenopathy Back:  No CVA tenderness Lungs:  Clear with no wheezes HEART:  IRegular rate rhythm, no murmurs, no rubs, no clicks Abd:  soft, positive bowel sounds, no organomegally, no rebound, no guarding Ext:  2 plus pulses, no edema, no cyanosis, no clubbing Skin:  No rashes no nodules Neuro:  CN II through XII intact, motor grossly intact  EKG - atrial fib with frequent PVC's   Assess/Plan: 1. 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 I have discussed the treatment options with the  patient. He is not a good candidate for AA drug therapy due to his multiple comorbidities. He does not have palpitations but feels poorly. I have discussed the indications/risks/benefits/goals/expectations of EP study and ablation of his PVC's which are left bundle morphology transitioning from negative to positive at lead V4. He will call us if he wishes to proceed. 2. HTN - his blood pressure is controlled. Will follow. 3. COPD - he is not a good candidate for Amio therapy.   Michael Whitney.D.

## 2017-07-05 ENCOUNTER — Telehealth: Payer: Self-pay | Admitting: Internal Medicine

## 2017-07-05 NOTE — Telephone Encounter (Signed)
I really don't have anything to add now.   Michael Gittleman [name] MD, [facility]

## 2017-07-05 NOTE — Telephone Encounter (Signed)
New message    STAT if patient feels like he/she is going to faint   1) Are you dizzy now? YES  2) Do you feel faint or have you passed out? NO  3) Do you have any other symptoms? Dizziness, lightheaded, a little pressure in breast area  4) Have you checked your HR and BP (record if available)? 135/90- HR 56

## 2017-07-05 NOTE — Telephone Encounter (Signed)
Returned the call to the patient. He stated that he has gradually been feeling worse with increased fatigue and weakness. He recently saw EP on 06/29/17. He is scheduled to have an ablation on 6/3 with Dr. Lovena Le and wonders if he needs to move this up. He has been instructed to go to the ED for further evaluation since he stated he is nervous he may pass out and he lives alone. He has refused for now but stated if he feels worse he will go. Message will be routed to EP and his nurse for further recommendation.

## 2017-07-06 NOTE — Telephone Encounter (Signed)
Returned call to Pt.  Advised there was availability for him to have his ablation next Jul 14, 2017.  Pt will discuss with family.  Advised will call back this afternoon.

## 2017-07-06 NOTE — Telephone Encounter (Signed)
Returned call to Pt.  Pt has decided to move procedure to Jul 14, 2017 at 10:30 am.   Pt to have labs drawn tomorrow.  Advised to pick up corrected instruction letter.  Pt indicates understanding.

## 2017-07-07 ENCOUNTER — Other Ambulatory Visit: Payer: Medicare Other | Admitting: *Deleted

## 2017-07-07 DIAGNOSIS — I493 Ventricular premature depolarization: Secondary | ICD-10-CM

## 2017-07-07 DIAGNOSIS — R42 Dizziness and giddiness: Secondary | ICD-10-CM

## 2017-07-07 NOTE — Telephone Encounter (Signed)
Spoke to patient advised to keep appointment as planned with Dr.Jordan 10/21/17 at 10:40 am.Good luck with upcoming ablation.

## 2017-07-08 LAB — BASIC METABOLIC PANEL
BUN/Creatinine Ratio: 15 (ref 10–24)
BUN: 20 mg/dL (ref 8–27)
CO2: 21 mmol/L (ref 20–29)
Calcium: 9 mg/dL (ref 8.6–10.2)
Chloride: 102 mmol/L (ref 96–106)
Creatinine, Ser: 1.34 mg/dL — ABNORMAL HIGH (ref 0.76–1.27)
GFR calc Af Amer: 62 mL/min/{1.73_m2} (ref >59)
GFR, EST NON AFRICAN AMERICAN: 54 mL/min/{1.73_m2} — AB (ref >59)
Glucose: 81 mg/dL (ref 65–99)
POTASSIUM: 4.7 mmol/L (ref 3.5–5.2)
SODIUM: 142 mmol/L (ref 134–144)

## 2017-07-08 LAB — CBC WITH DIFFERENTIAL/PLATELET
BASOS: 1 %
Basophils Absolute: 0 10*3/uL (ref 0.0–0.2)
EOS (ABSOLUTE): 0.1 10*3/uL (ref 0.0–0.4)
Eos: 2 %
Hematocrit: 54 % — ABNORMAL HIGH (ref 37.5–51.0)
Hemoglobin: 18.1 g/dL — ABNORMAL HIGH (ref 13.0–17.7)
IMMATURE GRANULOCYTES: 0 %
Immature Grans (Abs): 0 10*3/uL (ref 0.0–0.1)
Lymphocytes Absolute: 1.7 10*3/uL (ref 0.7–3.1)
Lymphs: 24 %
MCH: 31.7 pg (ref 26.6–33.0)
MCHC: 33.5 g/dL (ref 31.5–35.7)
MCV: 95 fL (ref 79–97)
Monocytes Absolute: 0.8 10*3/uL (ref 0.1–0.9)
Monocytes: 11 %
NEUTROS PCT: 62 %
Neutrophils Absolute: 4.4 10*3/uL (ref 1.4–7.0)
Platelets: 161 10*3/uL (ref 150–379)
RBC: 5.71 x10E6/uL (ref 4.14–5.80)
RDW: 14.9 % (ref 12.3–15.4)
WBC: 7 10*3/uL (ref 3.4–10.8)

## 2017-07-14 ENCOUNTER — Encounter (HOSPITAL_COMMUNITY)
Admission: RE | Disposition: A | Discharge status: Home / Self Care | Payer: Self-pay | Source: Ambulatory Visit | Attending: Internal Medicine

## 2017-07-14 ENCOUNTER — Ambulatory Visit (HOSPITAL_COMMUNITY)
Admission: RE | Admit: 2017-07-14 | Discharge: 2017-07-14 | Disposition: A | Discharge status: Home / Self Care | Payer: Medicare Other | Source: Ambulatory Visit | Attending: Internal Medicine | Admitting: Internal Medicine

## 2017-07-14 ENCOUNTER — Ambulatory Visit: Payer: Medicare Other | Admitting: Cardiology

## 2017-07-14 DIAGNOSIS — R569 Unspecified convulsions: Secondary | ICD-10-CM | POA: Diagnosis not present

## 2017-07-14 DIAGNOSIS — I493 Ventricular premature depolarization: Secondary | ICD-10-CM | POA: Diagnosis not present

## 2017-07-14 DIAGNOSIS — I4891 Unspecified atrial fibrillation: Secondary | ICD-10-CM | POA: Insufficient documentation

## 2017-07-14 DIAGNOSIS — I10 Essential (primary) hypertension: Secondary | ICD-10-CM | POA: Insufficient documentation

## 2017-07-14 DIAGNOSIS — E079 Disorder of thyroid, unspecified: Secondary | ICD-10-CM | POA: Diagnosis not present

## 2017-07-14 DIAGNOSIS — J449 Chronic obstructive pulmonary disease, unspecified: Secondary | ICD-10-CM | POA: Insufficient documentation

## 2017-07-14 DIAGNOSIS — Z8249 Family history of ischemic heart disease and other diseases of the circulatory system: Secondary | ICD-10-CM | POA: Diagnosis not present

## 2017-07-14 HISTORY — PX: PVC ABLATION: EP1236

## 2017-07-14 SURGERY — PVC ABLATION

## 2017-07-14 MED ORDER — SODIUM CHLORIDE 0.9 % IV SOLN
INTRAVENOUS | Status: DC
  Administered 2017-07-14: 10:00:00 via INTRAVENOUS

## 2017-07-14 MED ORDER — ONDANSETRON HCL 4 MG/2ML IJ SOLN: 4.0000 mg | Freq: Four times a day (QID) | INTRAMUSCULAR | Status: DC | PRN

## 2017-07-14 MED ORDER — ACETAMINOPHEN 325 MG PO TABS
650.0000 mg | ORAL_TABLET | ORAL | Status: DC | PRN
  Filled 2017-07-14: qty 2

## 2017-07-14 MED ORDER — MIDAZOLAM HCL 5 MG/5ML IJ SOLN
INTRAMUSCULAR | Status: AC
  Filled 2017-07-14: qty 5

## 2017-07-14 MED ORDER — SODIUM CHLORIDE 0.9% FLUSH: 3.0000 mL | Freq: Two times a day (BID) | INTRAVENOUS | Status: DC

## 2017-07-14 MED ORDER — MEXILETINE HCL 150 MG PO CAPS: 150.0000 mg | ORAL_CAPSULE | Freq: Two times a day (BID) | ORAL | 1 refills | Status: DC

## 2017-07-14 MED ORDER — BUPIVACAINE HCL (PF) 0.25 % IJ SOLN
INTRAMUSCULAR | Status: DC | PRN
  Administered 2017-07-14: 60 mL

## 2017-07-14 MED ORDER — HEPARIN (PORCINE) IN NACL 2-0.9 UNITS/ML
INTRAMUSCULAR | Status: AC | PRN
  Administered 2017-07-14: 500 mL

## 2017-07-14 MED ORDER — SODIUM CHLORIDE 0.9 % IV SOLN: 250.0000 mL | INTRAVENOUS | Status: DC | PRN

## 2017-07-14 MED ORDER — FENTANYL CITRATE (PF) 100 MCG/2ML IJ SOLN
INTRAMUSCULAR | Status: AC
  Filled 2017-07-14: qty 2

## 2017-07-14 MED ORDER — SODIUM CHLORIDE 0.9% FLUSH: 3.0000 mL | INTRAVENOUS | Status: DC | PRN

## 2017-07-14 MED ORDER — BUPIVACAINE HCL (PF) 0.25 % IJ SOLN
INTRAMUSCULAR | Status: AC
  Filled 2017-07-14: qty 60

## 2017-07-14 MED ORDER — HEPARIN (PORCINE) IN NACL 1000-0.9 UT/500ML-% IV SOLN
INTRAVENOUS | Status: AC
  Filled 2017-07-14: qty 500

## 2017-07-14 SURGICAL SUPPLY — 11 items
BAG SNAP BAND KOVER 36X36 (MISCELLANEOUS) ×2 IMPLANT
CATH EZ STEER NAV 4MM D-F CUR (ABLATOR) ×2 IMPLANT
CATH JOSEPH QUAD ALLRED 6F REP (CATHETERS) ×4 IMPLANT
HOVERMATT SINGLE USE (MISCELLANEOUS) ×2 IMPLANT
PACK EP LATEX FREE (CUSTOM PROCEDURE TRAY) ×1
PACK EP LF (CUSTOM PROCEDURE TRAY) ×1 IMPLANT
PAD DEFIB LIFELINK (PAD) ×2 IMPLANT
PATCH CARTO3 (PAD) ×2 IMPLANT
SHEATH PINNACLE 6F 10CM (SHEATH) ×4 IMPLANT
SHEATH PINNACLE 8F 10CM (SHEATH) ×2 IMPLANT
SHIELD RADPAD SCOOP 12X17 (MISCELLANEOUS) ×2 IMPLANT

## 2017-07-14 NOTE — Interval H&P Note (Signed)
History and Physical Interval Note:  07/14/2017 10:19 AM  Michael Whitney  has presented today for surgery, with the diagnosis of PVCs  The various methods of treatment have been discussed with the patient and family. After consideration of risks, benefits and other options for treatment, the patient has consented to  Procedure(s): PVC ABLATION (N/A) as a surgical intervention .  The patient's history has been reviewed, patient examined, no change in status, stable for surgery.  I have reviewed the patient's chart and labs.  Questions were answered to the patient's satisfaction.     Cristopher Peru

## 2017-07-14 NOTE — Discharge Instructions (Signed)
No driving for 4 days. No lifting over 5 lbs for 1 week. No sexual activity for 1 week. You may return to work in 1 week. Keep procedure site clean & dry. If you notice increased pain, swelling, bleeding or pus, call/return!  You may shower, but no soaking baths/hot tubs/pools for 1 week.   VENogram, post ablation, Care After This sheet gives you information about how to care for yourself after your procedure. Your health care provider may also give you more specific instructions. If you have problems or questions, contact your health care provider. What can I expect after the procedure? After the procedure, it is common to have bruising and tenderness at the catheter insertion area. Follow these instructions at home: Insertion site care  Follow instructions from your health care provider about how to take care of your insertion site. Make sure you: ? Wash your hands with soap and water before you change your bandage (dressing). If soap and water are not available, use hand sanitizer. ? Change your dressing as told by your health care provider. ? Leave stitches (sutures), skin glue, or adhesive strips in place. These skin closures may need to stay in place for 2 weeks or longer. If adhesive strip edges start to loosen and curl up, you may trim the loose edges. Do not remove adhesive strips completely unless your health care provider tells you to do that.  Do not take baths, swim, or use a hot tub until your health care provider approves.  You may shower 24-48 hours after the procedure or as told by your health care provider. ? Gently wash the site with plain soap and water. ? Pat the area dry with a clean towel. ? Do not rub the site. This may cause bleeding.  Do not apply powder or lotion to the site. Keep the site clean and dry.  Check your insertion site every day for signs of infection. Check for: ? Redness, swelling, or pain. ? Fluid or blood. ? Warmth. ? Pus or a bad  smell. Activity  Rest as told by your health care provider, usually for 1-2 days.  Do not lift anything that is heavier than 10 lbs. (4.5 kg) or as told by your health care provider.  Do not drive for 24 hours if you were given a medicine to help you relax (sedative).  Do not drive or use heavy machinery while taking prescription pain medicine. General instructions  Return to your normal activities as told by your health care provider, usually in about a week. Ask your health care provider what activities are safe for you.  If the catheter site starts bleeding, lie flat and put pressure on the site. If the bleeding does not stop, get help right away. This is a medical emergency.  Drink enough fluid to keep your urine clear or pale yellow. This helps flush the contrast dye from your body.  Take over-the-counter and prescription medicines only as told by your health care provider.  Keep all follow-up visits as told by your health care provider. This is important. Contact a health care provider if:  You have a fever or chills.  You have redness, swelling, or pain around your insertion site.  You have fluid or blood coming from your insertion site.  The insertion site feels warm to the touch.  You have pus or a bad smell coming from your insertion site.  You have bruising around the insertion site.  You notice blood collecting in the  tissue around the catheter site (hematoma). The hematoma may be painful to the touch. Get help right away if:  You have severe pain at the catheter insertion area.  The catheter insertion area swells very fast.  The catheter insertion area is bleeding, and the bleeding does not stop when you hold steady pressure on the area.  The area near or just beyond the catheter insertion site becomes pale, cool, tingly, or numb. These symptoms may represent a serious problem that is an emergency. Do not wait to see if the symptoms will go away. Get medical  help right away. Call your local emergency services (911 in the [place] Do not drive yourself to the hospital. Summary  After the procedure, it is common to have bruising and tenderness at the catheter insertion area.  After the procedure, it is important to rest and drink plenty of fluids.  Do not take baths, swim, or use a hot tub until your health care provider says it is okay to do so. You may shower 24-48 hours after the procedure or as told by your health care provider.  If the catheter site starts bleeding, lie flat and put pressure on the site. If the bleeding does not stop, get help right away. This is a medical emergency. This information is not intended to replace advice given to you by your health care provider. Make sure you discuss any questions you have with your health care provider. Document Released: 08/28/2004 Document Revised: 01/15/2016 Document Reviewed: 01/15/2016 Elsevier Interactive Patient Education  [employer].

## 2017-07-14 NOTE — Progress Notes (Signed)
Site area: RFV x 3 Site Prior to Removal:  Level 0 Pressure Applied For:40 min Manual:   yes Patient Status During Pull:  stable Post Pull Site:  Level 0 Post Pull Instructions Given:  yes Post Pull Pulses Present:  Dressing Applied:  Clear/gauze Bedrest begins @ 3354 till 1940 Comments:

## 2017-07-14 NOTE — Progress Notes (Signed)
Central monitoring called to inform pt is having afib with long pauses// slow rate, pt drops to hr of 35. Pause up to 2.9.  Pt is asymptomatic. Dr Lovena Le and Luetta Nutting PA for Dr Lovena Le was called and notified. It was explained that the medication prescribed// mexiletine with hopefully help with that. No further orders followed.

## 2017-07-14 NOTE — Interval H&P Note (Signed)
History and Physical Interval Note:  07/14/2017 10:18 AM  Michael Whitney  has presented today for surgery, with the diagnosis of PVCs  The various methods of treatment have been discussed with the patient and family. After consideration of risks, benefits and other options for treatment, the patient has consented to  Procedure(s): PVC ABLATION (N/A) as a surgical intervention .  The patient's history has been reviewed, patient examined, no change in status, stable for surgery.  I have reviewed the patient's chart and labs.  Questions were answered to the patient's satisfaction.     Michael Whitney

## 2017-07-15 ENCOUNTER — Encounter

## 2017-07-15 ENCOUNTER — Encounter (HOSPITAL_COMMUNITY): Payer: Self-pay | Admitting: Internal Medicine

## 2017-07-15 ENCOUNTER — Institutional Professional Consult (permissible substitution): Payer: Medicare Other | Admitting: Internal Medicine

## 2017-07-16 ENCOUNTER — Telehealth: Payer: Self-pay

## 2017-07-16 ENCOUNTER — Other Ambulatory Visit: Payer: Medicare Other

## 2017-07-16 MED ORDER — LISINOPRIL 5 MG PO TABS: 5.0000 mg | ORAL_TABLET | Freq: Every day | ORAL | 3 refills | Status: DC

## 2017-07-16 NOTE — Telephone Encounter (Signed)
Pt verbalized understanding of Lisinopril dose adjustment-- From 10mg  tablet to 5mg , half tablet daily. Pt verbalized he would monitor his BP for the next week. We also verified his follow up appointment with Dr Lovena Le on July 17. Pt verbalized understanding and had no additional questions.

## 2017-07-28 ENCOUNTER — Telehealth: Payer: Self-pay | Admitting: Internal Medicine

## 2017-07-28 NOTE — Telephone Encounter (Signed)
Pt calling    Pt is having low energy and feel off balance. He said sometimes he have to stop and hold on to something. Pt wish to speak to nurse.Marland KitchenBp  135//73  Pulse 50-63

## 2017-07-28 NOTE — Telephone Encounter (Signed)
Spoke to patient he stated he feels light headed,hard to sleep at night,no energy,numbness and tingling in both arms.No chest pain.B/P ranging 132/70 to 80.Stated with exertion B/P 155/70 to 80.Pulse 60 to 70.Dr.Jordan advised could be related to mexitil.Patient wanted to know if he should see Dr.Taylor before 09/08/17.Message sent to Dr.Taylor for advice.

## 2017-07-28 NOTE — Telephone Encounter (Signed)
Don't know. BP and HR Ok. ? If symptoms related to mexitil. Would run by Dr.Taylor.  Michael Blunck [name] MD, [facility]

## 2017-07-29 ENCOUNTER — Telehealth: Payer: Self-pay | Admitting: Internal Medicine

## 2017-07-29 NOTE — Telephone Encounter (Signed)
Advised per Dr. Lovena Le to stop mexilitine and see if symptoms improve.  Will check in with Pt next week.

## 2017-07-29 NOTE — Telephone Encounter (Signed)
Duplicate.  See active phone note from 07/28/2017.

## 2017-07-29 NOTE — Telephone Encounter (Signed)
New Message:       Pt is calling back in refer to his call to [place] on yesterday.

## 2017-07-31 NOTE — Telephone Encounter (Signed)
Agree. GT 

## 2017-08-03 ENCOUNTER — Telehealth: Payer: Self-pay | Admitting: Internal Medicine

## 2017-08-03 DIAGNOSIS — I493 Ventricular premature depolarization: Secondary | ICD-10-CM

## 2017-08-03 NOTE — Telephone Encounter (Signed)
New message      STAT if patient feels like he/she is going to faint   1) Are you dizzy now? YES  2) Do you feel faint or have you passed out? NO  3) Do you have any other symptoms? Patient calling with complaint of weakness and dizziness and lightheaded.   4) Have you checked your HR and BP (record if available)? 134/89 HR 48, 140/77 HR 54

## 2017-08-04 NOTE — Telephone Encounter (Signed)
Call returned to Pt.  Advised Dr. Lovena Le would like to repeat Pt's 24 holter monitor prior to his f/u appt September 08, 2017 Pt indicates understanding.  Order entered.

## 2017-08-23 ENCOUNTER — Ambulatory Visit: Payer: Medicare Other | Admitting: Internal Medicine

## 2017-09-02 ENCOUNTER — Ambulatory Visit (INDEPENDENT_AMBULATORY_CARE_PROVIDER_SITE_OTHER): Payer: Medicare Other

## 2017-09-02 DIAGNOSIS — I493 Ventricular premature depolarization: Secondary | ICD-10-CM

## 2017-09-03 ENCOUNTER — Telehealth: Payer: Self-pay | Admitting: Internal Medicine
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
## 2017-09-07 ENCOUNTER — Telehealth: Payer: Self-pay | Admitting: Internal Medicine

## 2017-09-07 ENCOUNTER — Telehealth: Payer: Self-pay

## 2017-09-07 NOTE — Telephone Encounter (Signed)
Pt calling and wanting to know if he can do his appt over the phone because he is unable to come tomorrow. Please call pt.
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
## 2017-09-08 ENCOUNTER — Ambulatory Visit: Payer: Medicare Other | Admitting: Internal Medicine

## 2017-09-09 NOTE — Telephone Encounter (Signed)
Rescheduled Pt for end of this month.  Holter results called to Pt.  All questions answered.

## 2017-09-22 ENCOUNTER — Encounter: Payer: Self-pay | Admitting: Internal Medicine

## 2017-09-22 ENCOUNTER — Ambulatory Visit: Payer: Medicare Other | Admitting: Internal Medicine

## 2017-09-22 VITALS — BP 124/72 | HR 72 | Ht 75.0 in | Wt 281.4 lb

## 2017-09-22 DIAGNOSIS — I10 Essential (primary) hypertension: Secondary | ICD-10-CM | POA: Diagnosis not present

## 2017-09-22 DIAGNOSIS — I493 Ventricular premature depolarization: Secondary | ICD-10-CM | POA: Diagnosis not present

## 2017-09-22 NOTE — Patient Instructions (Signed)

## 2017-09-22 NOTE — Progress Notes (Signed)
HPI [name] returns today for followup of symptomatic PVC"s. He is a pleasant obese man with very dense PVC's for which he underwent EP study and catheter ablation. 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 Catheter ablation was not attempted out of concern that he develop heart block. He was prescribed mexitil. Unclear if he is taking. His symptoms are much improved. No Known Allergies   Current Outpatient Medications  Medication Sig Dispense Refill  . apixaban (ELIQUIS) 5 MG TABS tablet Take 1 tablet (5 mg total) by mouth 2 (two) times daily. 180 tablet 3  . BIOTIN PO Take 1 tablet by mouth daily.    . Calcium Carb-Cholecalciferol (CALCIUM 600+D3 PO) Take 1 tablet by mouth daily.    . fluticasone (FLONASE) 50 MCG/ACT nasal spray Place 2 sprays into both nostrils daily as needed for allergies.     Marland Kitchen lisinopril (PRINIVIL,ZESTRIL) 5 MG tablet Take 1 tablet (5 mg total) by mouth daily. 90 tablet 3  . loratadine (CLARITIN) 10 MG tablet Take 10 mg by mouth daily.    . metFORMIN (GLUCOPHAGE-XR) 750 MG 24 hr tablet Take 1 tablet by mouth daily.  5  . methimazole (TAPAZOLE) 5 MG tablet Take 5 mg by mouth daily.  2  . Multiple Vitamin (MULTIVITAMIN WITH MINERALS) TABS tablet Take 1 tablet by mouth daily.    . Omega-3 Fatty Acids (FISH OIL) 1000 MG CAPS Take 1,000 mg by mouth daily.     No current facility-administered medications for this visit.      Past Medical History:  Diagnosis Date  . Cataract   . Hypertension   . Seizures ([place])     ROS:   All systems reviewed and negative except as noted in the HPI.   Past Surgical History:  Procedure Laterality Date  . CATARACT EXTRACTION    . PVC ABLATION N/A 07/14/2017   Procedure: PVC ABLATION;  Surgeon: Michael Lance, MD;  Location: [facility];  Service: Cardiovascular;  Laterality: N/A;  . TONSILLECTOMY    . VARICOSE VEIN SURGERY       Family History  Problem Relation Age of Onset  . CAD  Mother        MI at age 36     Social History   Socioeconomic History  . Marital status: Widowed    Spouse name: Not on file  . Number of children: 2  . Years of education: Not on file  . Highest education level: Not on file  Occupational History  . Occupation: retired    Comment: Actuary  . Financial resource strain: Not on file  . Food insecurity:    Worry: Not on file    Inability: Not on file  . Transportation needs:    Medical: Not on file    Non-medical: Not on file  Tobacco Use  . Smoking status: Never Smoker  . Smokeless tobacco: Never Used  Substance and Sexual Activity  . Alcohol use: Yes    Alcohol/week: 1.2 oz    Types: 1 Shots of liquor, 1 Cans of beer per week    Comment: occasionally  . Drug use: No  . Sexual activity: Not on file  Lifestyle  . Physical activity:    Days per week: Not on file    Minutes per session: Not on file  . Stress: Not on file  Relationships  . Social connections:    Talks on phone: Not  on file    Gets together: Not on file    Attends religious service: Not on file    Active member of club or organization: Not on file    Attends meetings of clubs or organizations: Not on file    Relationship status: Not on file  . Intimate partner violence:    Fear of current or ex partner: Not on file    Emotionally abused: Not on file    Physically abused: Not on file    Forced sexual activity: Not on file  Other Topics Concern  . Not on file  Social History Narrative  . Not on file     BP 124/72   Pulse 72   Ht 6\' 3"  (1.905 m)   Wt 281 lb 6.4 oz (127.6 kg)   SpO2 96%   BMI 35.17 kg/m   Physical Exam:  Well appearing obese 67 yo man, NAD HEENT: Unremarkable Neck:  6 cm JVD, no thyromegally Lymphatics:  No adenopathy Back:  No CVA tenderness Lungs:  Clear with no wheezes HEART:  Regular rate rhythm, no murmurs, no rubs, no clicks Abd:  soft, positive bowel sounds, no organomegally, no rebound, no  guarding Ext:  2 plus pulses, no edema, no cyanosis, no clubbing Skin:  No rashes no nodules Neuro:  CN II through XII intact, motor grossly intact  EKG - NSR  Assess/Plan: 1. PVC"s - his symptoms are much improved and I suspect his ectopy is also improved. I would recommend he continue his current meds and maintain a low sodium diet. 2. HTN - his blood pressure is controlled. 3. Dizziness - the etiology is unclear but it is clear that he is dizzy when his blood pressure is normal. He will followup with his neurologist.  Michael Whitney.

## 2017-09-24 NOTE — Addendum Note (Signed)
Addended by: Rose Phi on: 09/24/2017 08:03 AM   Modules accepted: Orders

## 2017-10-15 NOTE — Progress Notes (Deleted)
Cardiology Office Note   Date:  10/15/2017   ID:  Ellard, Nan 1950-11-15, MRN [id number]  PCP:  Jonathon Bellows, PA-C  Cardiologist:   Peter [name], MD   No chief complaint on file.     History of Present Illness: Michael Whitney is a 67 y.o. male is seen for follow up Atrial fibrillation.  He has a history of HTN and seizure disorder. He reported a 5 year history of dizziness. In 2014 he had evaluation with Ecg and event monitor that were unremarkable. He does have a history of seizure disorder - he only knows of one seizure and has been on Keppra. He does have a history of hyperthyroidism. Was on Tapazole but this has been discontinued. When initially seen he had a number of complaints including lightheadedness, fatigue, dizziness, 50 lb weight gain, itchy skin, and runny nose. Some intermitted swelling in right ankle. 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 Feels bloated. He was found to be in Afib with a controlled rate. Duration unknown. He was started on anticoagulation. He subsequently developed symptoms of dizziness and lightheadedness. Holter monitor showed AFib with slow rate. Longest pause of 3 seconds. He had very frequent PVCs with a burden of 33%. He was seen in consult by Dr Lovena Le. On 07/14/17 he underwent EP study showing no inducible VT. Earliest activation of PVCs noted to be in the area of His bundle. Ablation was not performed due to concern about developing complete heart block. Mexilitine was recommended but on follow up in July it was unclear if he was taking this. His symptoms had improved. Repeat Holter showed significant improvement in PVC burden.   He reports that since his last visit he is feeling much better. His Keppra is being tapered off. His dizziness is much better. Edema has resolved. Weight is coming down. Still not really aware of Afib.     Past Medical History:  Diagnosis Date  . Cataract   . Hypertension   . Seizures ([place])     Past Surgical  History:  Procedure Laterality Date  . CATARACT EXTRACTION    . PVC ABLATION N/A 07/14/2017   Procedure: PVC ABLATION;  Surgeon: Evans Lance, MD;  Location: [facility];  Service: Cardiovascular;  Laterality: N/A;  . TONSILLECTOMY    . VARICOSE VEIN SURGERY       Current Outpatient Medications  Medication Sig Dispense Refill  . apixaban (ELIQUIS) 5 MG TABS tablet Take 1 tablet (5 mg total) by mouth 2 (two) times daily. 180 tablet 3  . BIOTIN PO Take 1 tablet by mouth daily.    . Calcium Carb-Cholecalciferol (CALCIUM 600+D3 PO) Take 1 tablet by mouth daily.    . fluticasone (FLONASE) 50 MCG/ACT nasal spray Place 2 sprays into both nostrils daily as needed for allergies.     Marland Kitchen lisinopril (PRINIVIL,ZESTRIL) 5 MG tablet Take 1 tablet (5 mg total) by mouth daily. 90 tablet 3  . loratadine (CLARITIN) 10 MG tablet Take 10 mg by mouth daily.    . metFORMIN (GLUCOPHAGE-XR) 750 MG 24 hr tablet Take 1 tablet by mouth daily.  5  . methimazole (TAPAZOLE) 5 MG tablet Take 5 mg by mouth daily.  2  . Multiple Vitamin (MULTIVITAMIN WITH MINERALS) TABS tablet Take 1 tablet by mouth daily.    . Omega-3 Fatty Acids (FISH OIL) 1000 MG CAPS Take 1,000 mg by mouth daily.     No current facility-administered medications for this visit.  Allergies:   Patient has no known allergies.    Social History:  The patient  reports that he has never smoked. He has never used smokeless tobacco. He reports that he drinks about 2.0 standard drinks of alcohol per week. He reports that he does not use drugs.   Family History:  The patient's family history includes CAD in his mother.    ROS:  Please see the history of present illness- diffusely positive.   Otherwise, review of systems are positive for none.   All other systems are reviewed and negative.    PHYSICAL EXAM: VS:  There were no vitals taken for this visit. , BMI There is no height or weight on file to calculate BMI. GENERAL:  Well appearing,  obese WM in NAD HEENT:  PERRL, EOMI, sclera are clear. Oropharynx is clear. NECK:  No jugular venous distention, carotid upstroke brisk and symmetric, no bruits, no thyromegaly or adenopathy LUNGS:  Clear to auscultation bilaterally CHEST:  Unremarkable HEART:  IRRR,  PMI not displaced or sustained,S1 and S2 within normal limits, no S3, no S4: no clicks, no rubs, no murmurs ABD:  Soft, nontender. BS +, no masses or bruits. No hepatomegaly, no splenomegaly EXT:  2 + pulses throughout, no edema, no cyanosis no clubbing SKIN:  Warm and dry.  No rashes NEURO:  Alert and oriented x 3. Cranial nerves II through XII intact. PSYCH:  Cognitively intact     EKG:  EKG is not ordered today.   Recent Labs: 07/07/2017: BUN 20; Creatinine, Ser 1.34; Hemoglobin 18.1; Platelets 161; Potassium 4.7; Sodium 142    Lipid Panel    Component Value Date/Time   CHOL 111 11/04/2012 1125   TRIG 104 11/04/2012 1125   HDL 35 (L) 11/04/2012 1125   CHOLHDL 3.2 11/04/2012 1125   VLDL 21 11/04/2012 1125   LDLCALC 55 11/04/2012 1125      Wt Readings from Last 3 Encounters:  09/22/17 281 lb 6.4 oz (127.6 kg)  07/14/17 280 lb (127 kg)  06/29/17 287 lb (130.2 kg)    Labs dated 01/11/17: normal UA and CBC. TSH 0.08. Free T4 normal.  Dated 02/19/17; A1c 6%.   Other studies Reviewed: Additional studies/ records that were reviewed today include:   Echo 11/04/12: Study Conclusions  Left ventricle: The cavity size was normal. Wall thickness was increased in a pattern of mild LVH. Systolic function was normal. The estimated ejection fraction was in the range of 55% to 60%.       Event monitor 2014- occ isolated PVCs. No Afib.     Echo 03/29/17: Study Conclusions  - Left ventricle: The cavity size was normal. There was mild   concentric hypertrophy. Systolic function was normal. The   estimated ejection fraction was in the range of 60% to 65%.   Although no diagnostic regional wall motion  abnormality was   identified, this possibility cannot be completely excluded on the   basis of this study. - Aortic valve: Moderate focal calcification involving the right   coronary and left coronary cusp. There was very mild stenosis.   Valve area (VTI): 1.72 cm^2. Valve area (Vmax): 1.62 cm^2. Valve   area (Vmean): 1.43 cm^2. - Left atrium: The atrium was severely dilated. - Right atrium: The atrium was moderately to severely dilated. - Atrial septum: No defect or patent foramen ovale was identified. - Pulmonary arteries: Systolic pressure was mildly increased. PA   peak pressure: 35 mm Hg (S).  Holter 06/23/17: Study  Highlights   Addendum by [name], Peter M, MD on Fri Jun 25, 2017 2:08 PM   Atrial fibrillation with slow ventricular response  Longest Pause 3.0 seconds  Frequent PVCs with couplets, bigeminy, and multiform PVCs.    Finalized by [name], Peter M, MD on Fri Jun 25, 2017 2:06 PM   Atrial fibrillation  Longest Pause 3.0 seconds  Frequent PVCs with couplets, bigeminy, and multiform PVCs.   EP study 07/14/17: Conclusion   Conclusion: EP study and three-dimensional electro-anatomic mapping of dense and frequent PVCs demonstrating earliest ventricular activation to be in the region of the AV node.  Because of the likely result of complete heart block, catheter ablation was not carried out.  Cristopher Peru, MD   Holter 09/02/17: Study Highlights   1. Atrial fib with a controlled VR, slow VR and RVR (minimal) 2. NSVT, up to 3 beats 3. Mostly nocturnal bradycardia with HR's in the 30's and daytime bradycardia in the 40's 4. No prolonged daytime pauses 5. Noise artifact is present.  Gregg Taylor,M.D.     ASSESSMENT AND PLAN:  1.  Atrial fibrillation persistent and probably permanent.  Rate controlled on no rate slowing medication. He is not clearly symptomatic. [place] Vasc score of 2. Recommend anticoagulation for reduction of stroke risk. Continue long term. Start  Eliquis 5 mg bid. Do not take ASA or NSAIDs. Echo shows marked biatrial enlargement. This suggests that his Afib has been of long term duration and also indicates a low success rate for cardioversion and maintaining NSR. At this point he is feeling better. I would recommend continued strategy of rate control and anticoagulation. I don't think he would maintain NSR with DCCV alone. If we were to choose a rhythm control strategy AAD therapy would be needed and even so I think long term maintenance of NSR would be difficult.  2. HTN controlled. Continue lisinopril 3. History of hyperthyroidism. Followed by Endocrinology- Dr. Bernie Covey.  4. Frequent PVCs. S/p EP study as noted.    Current medicines are reviewed at length with the patient today.  The patient does not have concerns regarding medicines.  The following changes have been made:  Eliquis 5 mg bid.  Labs/ tests ordered today include:   No orders of the defined types were placed in this encounter.    Disposition:   FU with me in 2 months.  Signed, Peter [name], MD  10/15/2017 4:06 PM    [facility] [address] [place], [place], [address] Phone [phone], Fax [phone]

## 2017-10-18 ENCOUNTER — Telehealth: Payer: Self-pay | Admitting: Internal Medicine

## 2017-10-18 NOTE — Telephone Encounter (Signed)
Pt c/o medication issue:  1. Name of Medication: Eliquis- he wants to know if he can stop it or cut back on the dose  2. How are you currently taking this medication (dosage and times per day)? @ times a day  3. Are you having a reaction (difficulty breathing--STAT)? No  4. What is your medication issue? Vertigo-feels very dizzy, hard to stand up-

## 2017-10-19 NOTE — Telephone Encounter (Signed)
Returned call to Pt.  Advised that Eliquis was probably not causing his dizziness, d/t he has had dizziness for years.  Advised Dr. Lovena Le had recommended he follow up with his neurologist.  Pt states he has appt next week with Neuro.  Advised Pt to keep appt and to continue Eliquis.  Pt indicates understanding.

## 2017-10-21 ENCOUNTER — Ambulatory Visit: Payer: Medicare Other | Admitting: Cardiology

## 2017-11-15 ENCOUNTER — Ambulatory Visit: Payer: Medicare Other | Admitting: Cardiology

## 2018-01-14 NOTE — Progress Notes (Signed)
Cardiology Office Note   Date:  01/19/2018   ID:  Michael Whitney, DOB 1950-11-02, MRN [id number]  PCP:  Jonathon Bellows, PA-C  Cardiologist:   Peter [name], MD   Chief Complaint  Patient presents with  . Atrial Fibrillation      History of Present Illness: Michael Whitney is a 67 y.o. male is seen for follow up Atrial fibrillation.  He has a history of HTN and seizure disorder. He reported a 5 year history of dizziness. In 2014 he had evaluation with Ecg and event monitor that were unremarkable. He does have a history of seizure disorder - he only knows of one seizure and has been on Keppra. He does have a history of hyperthyroidism. Was on Tapazole but this has been discontinued.   When initially seen he had a number of complaints including lightheadedness, fatigue, dizziness, 50 lb weight gain, itchy skin, and runny nose. Some intermitted swelling in right ankle. 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 Feels bloated. He was found to be in Afib with a controlled rate. Duration unknown. He was started on anticoagulation. On follow up with me in February 2019 he seemed to be doing well and a strategy of rate control and anticoagulation was adopted. He later developed significant dizziness and fatigue. A holter monitor was done showing Afib with slow VR. He had frequent PVCs a heavy PVC burden. He underwent EP study by Dr. 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 Ablation not recommended due to concern about possible heart block. He was prescribed mexilitene. On follow up in July Dr Lovena Le noted his symptoms were  improved and it was unclear whether patient was taking medication.   In August he again complained of dizziness with normal BP. Follow up with Neurology recommended. He was seen in June by Neurology who felt he had BPV and vestibular training recommended.   On follow up today he is doing better. He still has some dizziness but the vestibular training has helped. He is no longer  on lisinopril and reports BP usually 388 systolic. No chest pain, palpitations, dyspnea.      Past Medical History:  Diagnosis Date  . Cataract   . Hypertension   . Seizures ([place])     Past Surgical History:  Procedure Laterality Date  . CATARACT EXTRACTION    . PVC ABLATION N/A 07/14/2017   Procedure: PVC ABLATION;  Surgeon: Evans Lance, MD;  Location: [facility];  Service: Cardiovascular;  Laterality: N/A;  . TONSILLECTOMY    . VARICOSE VEIN SURGERY       Current Outpatient Medications  Medication Sig Dispense Refill  . apixaban (ELIQUIS) 5 MG TABS tablet Take 1 tablet (5 mg total) by mouth 2 (two) times daily. 180 tablet 3  . fluticasone (FLONASE) 50 MCG/ACT nasal spray Place 2 sprays into both nostrils daily as needed for allergies.     Marland Kitchen loratadine (CLARITIN) 10 MG tablet Take 10 mg by mouth daily.    . metFORMIN (GLUCOPHAGE-XR) 750 MG 24 hr tablet Take 1 tablet by mouth daily.  5  . methimazole (TAPAZOLE) 5 MG tablet Take 5 mg by mouth daily.  2  . BIOTIN PO Take 1 tablet by mouth daily.    . Calcium Carb-Cholecalciferol (CALCIUM 600+D3 PO) Take 1 tablet by mouth daily.    . Multiple Vitamin (MULTIVITAMIN WITH MINERALS) TABS tablet Take 1 tablet by mouth daily.    . Omega-3 Fatty Acids (FISH OIL) 1000  MG CAPS Take 1,000 mg by mouth daily.     No current facility-administered medications for this visit.     Allergies:   Patient has no known allergies.    Social History:  The patient  reports that he has never smoked. He has never used smokeless tobacco. He reports that he drinks about 2.0 standard drinks of alcohol per week. He reports that he does not use drugs.   Family History:  The patient's family history includes CAD in his mother.    ROS:  Please see the history of present illness- diffusely positive.   Otherwise, review of systems are positive for none.   All other systems are reviewed and negative.    PHYSICAL EXAM: VS:  BP (!) 146/84   Pulse  72   Ht 6\' 3"  (1.905 m)   Wt 287 lb (130.2 kg)   BMI 35.87 kg/m  , BMI Body mass index is 35.87 kg/m. GENERAL:  Well appearing, obese WM in NAD HEENT:  PERRL, EOMI, sclera are clear. Oropharynx is clear. NECK:  No jugular venous distention, carotid upstroke brisk and symmetric, no bruits, no thyromegaly or adenopathy LUNGS:  Clear to auscultation bilaterally CHEST:  Unremarkable HEART:  IRRR,  PMI not displaced or sustained,S1 and S2 within normal limits, no S3, no S4: no clicks, no rubs, no murmurs ABD:  Soft, nontender. BS +, no masses or bruits. No hepatomegaly, no splenomegaly EXT:  2 + pulses throughout, no edema, no cyanosis no clubbing SKIN:  Warm and dry.  No rashes NEURO:  Alert and oriented x 3. Cranial nerves II through XII intact. PSYCH:  Cognitively intact     EKG:  EKG is not ordered today.   Recent Labs: 07/07/2017: BUN 20; Creatinine, Ser 1.34; Hemoglobin 18.1; Platelets 161; Potassium 4.7; Sodium 142    Lipid Panel    Component Value Date/Time   CHOL 111 11/04/2012 1125   TRIG 104 11/04/2012 1125   HDL 35 (L) 11/04/2012 1125   CHOLHDL 3.2 11/04/2012 1125   VLDL 21 11/04/2012 1125   LDLCALC 55 11/04/2012 1125      Wt Readings from Last 3 Encounters:  01/19/18 287 lb (130.2 kg)  09/22/17 281 lb 6.4 oz (127.6 kg)  07/14/17 280 lb (127 kg)    Labs dated 01/11/17: normal UA and CBC. TSH 0.08. Free T4 normal.  Dated 02/19/17; A1c 6%.   Other studies Reviewed: Additional studies/ records that were reviewed today include:   Echo 11/04/12: Study Conclusions  Left ventricle: The cavity size was normal. Wall thickness was increased in a pattern of mild LVH. Systolic function was normal. The estimated ejection fraction was in the range of 55% to 60%.       Event monitor 2014- occ isolated PVCs. No Afib.     Echo 03/29/17: Study Conclusions  - Left ventricle: The cavity size was normal. There was mild   concentric hypertrophy. Systolic function  was normal. The   estimated ejection fraction was in the range of 60% to 65%.   Although no diagnostic regional wall motion abnormality was   identified, this possibility cannot be completely excluded on the   basis of this study. - Aortic valve: Moderate focal calcification involving the right   coronary and left coronary cusp. There was very mild stenosis.   Valve area (VTI): 1.72 cm^2. Valve area (Vmax): 1.62 cm^2. Valve   area (Vmean): 1.43 cm^2. - Left atrium: The atrium was severely dilated. - Right atrium: The atrium  was moderately to severely dilated. - Atrial septum: No defect or patent foramen ovale was identified. - Pulmonary arteries: Systolic pressure was mildly increased. PA   peak pressure: 35 mm Hg (S).  ASSESSMENT AND PLAN:  1.  Atrial fibrillation persistent.  Rate controlled on no rate slowing medication. He is not  symptomatic. [place] Vasc score of 2. Recommend anticoagulation for reduction of stroke risk. Continue Eliquis long term. Do not take ASA or NSAIDs. Echo shows marked biatrial enlargement. This suggests that his Afib has been of long term duration and also indicates a low success rate for cardioversion and maintaining NSR. At this point he is feeling better. I would recommend continued strategy of rate control and anticoagulation. 2. HTN controlled. Off medication 3. History of hyperthyroidism. Followed by Endocrinology- Dr. Bernie Covey.  4. 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 Not a candidate for ablation.  5. BPV continue vestibular exercises.    Follow up in one year.  Signed, Peter [name], MD  01/19/2018 8:55 AM    [facility] [address] [place], [place], [address] Phone [phone], Fax [phone]

## 2018-01-19 ENCOUNTER — Ambulatory Visit: Payer: Medicare Other | Admitting: Cardiology

## 2018-01-19 ENCOUNTER — Encounter: Payer: Self-pay | Admitting: Cardiology

## 2018-01-19 VITALS — BP 146/84 | HR 72 | Ht 75.0 in | Wt 287.0 lb

## 2018-01-19 DIAGNOSIS — I4819 Other persistent atrial fibrillation: Secondary | ICD-10-CM | POA: Diagnosis not present

## 2018-01-19 DIAGNOSIS — I493 Ventricular premature depolarization: Secondary | ICD-10-CM

## 2018-01-19 DIAGNOSIS — R42 Dizziness and giddiness: Secondary | ICD-10-CM | POA: Diagnosis not present

## 2018-02-07 ENCOUNTER — Telehealth: Payer: Self-pay | Admitting: Cardiology

## 2018-02-07 NOTE — Telephone Encounter (Signed)
New Message   Pt states he is experiencing extreme fatigue and weakness and wold like to speak with a nurse

## 2018-02-07 NOTE — Telephone Encounter (Signed)
Returned call to patient.He stated he has been feeling fatigued for the past 2 months.Stated at times he feels like he is going to faint,has not fainted.Appointment scheduled with Dr.Jordan 02/08/18 at 10:40 am.

## 2018-02-08 ENCOUNTER — Encounter: Payer: Self-pay | Admitting: Cardiology

## 2018-02-08 ENCOUNTER — Ambulatory Visit: Payer: Medicare Other | Admitting: Cardiology

## 2018-02-08 VITALS — BP 158/72 | HR 56 | Ht 75.0 in | Wt 286.0 lb

## 2018-02-08 DIAGNOSIS — R5383 Other fatigue: Secondary | ICD-10-CM

## 2018-02-08 DIAGNOSIS — I493 Ventricular premature depolarization: Secondary | ICD-10-CM | POA: Diagnosis not present

## 2018-02-08 DIAGNOSIS — R42 Dizziness and giddiness: Secondary | ICD-10-CM

## 2018-02-08 DIAGNOSIS — I4819 Other persistent atrial fibrillation: Secondary | ICD-10-CM

## 2018-02-08 DIAGNOSIS — I10 Essential (primary) hypertension: Secondary | ICD-10-CM | POA: Diagnosis not present

## 2018-02-08 NOTE — Progress Notes (Signed)
Cardiology Office Note   Date:  02/08/2018   ID:  [name], DOB 17-Feb-1951, MRN [id number]  PCP:  Jonathon Bellows, PA-C  Cardiologist:   Margherita Collyer [name], MD   Chief Complaint  Patient presents with  . Follow-up      History of Present Illness: Michael Whitney is a 67 y.o. male is seen for evaluation of fatigue and feeling faint.  He has a history of Afib. He has a history of HTN and seizure disorder. He reported a 5 year history of dizziness. In 2014 he had evaluation with Ecg and event monitor that were unremarkable. He does have a history of seizure disorder - he only knows of one seizure and had been on Keppra- later discontinued. He does have a history of hyperthyroidism.   When initially seen in January 2019 he had a number of complaints including lightheadedness, fatigue, dizziness, 50 lb weight gain, itchy skin, and runny nose. Some intermitted swelling in right ankle. 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 Felt bloated. He was found to be in Afib with a controlled rate. Duration unknown. He was started on anticoagulation.   On follow up with me in February 2019 he seemed to be doing well and a strategy of rate control and anticoagulation was adopted. He later developed significant dizziness and fatigue. A holter monitor was done showing Afib with slow VR. He had frequent PVCs a heavy PVC burden. He underwent EP study by Dr. 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 Ablation not recommended due to concern about possible heart block. He was prescribed mexilitene. On follow up in July Dr Lovena Le noted his symptoms were  improved and it was unclear whether patient was taking medication.   In August he again complained of dizziness with normal BP. Follow up with Neurology recommended. He was seen in June by Neurology who felt he had BPV and vestibular training recommended.   When seen January 19, 2018 he was doing better. He was still having  some dizziness but the vestibular  training seemed to have helped.  He had follow up thyroid studies on Dec 4 and has mild hyperthyroidism. Methimazole dose increased.  Creatinine 1.2. magnesium normal. He is scheduled for a sleep study next month.  On follow up today he has a number of complaints again. Feels SOB. Notes numbness bilaterally in his arms. At a movie he felt a draining sensation in his left head and thought he might pass out. He has been monitoring his BP and is ranges from 032-122 systolic and 48-25 diastolic. HR 52-82. Feels fatigued.       Past Medical History:  Diagnosis Date  . Cataract   . Hypertension   . Seizures ([place])     Past Surgical History:  Procedure Laterality Date  . CATARACT EXTRACTION    . PVC ABLATION N/A 07/14/2017   Procedure: PVC ABLATION;  Surgeon: Evans Lance, MD;  Location: [facility];  Service: Cardiovascular;  Laterality: N/A;  . TONSILLECTOMY    . VARICOSE VEIN SURGERY       Current Outpatient Medications  Medication Sig Dispense Refill  . apixaban (ELIQUIS) 5 MG TABS tablet Take 1 tablet (5 mg total) by mouth 2 (two) times daily. 180 tablet 3  . BIOTIN PO Take 1 tablet by mouth daily.    . Calcium Carb-Cholecalciferol (CALCIUM 600+D3 PO) Take 1 tablet by mouth daily.    . fluticasone (FLONASE) 50 MCG/ACT nasal spray Place 2 sprays  into both nostrils daily as needed for allergies.     Marland Kitchen loratadine (CLARITIN) 10 MG tablet Take 10 mg by mouth daily.    . metFORMIN (GLUCOPHAGE-XR) 750 MG 24 hr tablet Take 1 tablet by mouth daily.  5  . methimazole (TAPAZOLE) 5 MG tablet Take 5 mg by mouth daily.  2  . Multiple Vitamin (MULTIVITAMIN WITH MINERALS) TABS tablet Take 1 tablet by mouth daily.    . Omega-3 Fatty Acids (FISH OIL) 1000 MG CAPS Take 1,000 mg by mouth daily.     No current facility-administered medications for this visit.     Allergies:   Patient has no known allergies.    Social History:  The patient  reports that he has never smoked. He has never  used smokeless tobacco. He reports current alcohol use of about 2.0 standard drinks of alcohol per week. He reports that he does not use drugs.   Family History:  The patient's family history includes CAD in his mother.    ROS:  Please see the history of present illness- diffusely positive.   Otherwise, review of systems are positive for none.   All other systems are reviewed and negative.    PHYSICAL EXAM: VS:  BP (!) 158/72   Pulse (!) 56   Ht 6\' 3"  (1.905 m)   Wt 286 lb (129.7 kg)   BMI 35.75 kg/m  , BMI Body mass index is 35.75 kg/m.   Orthostatic vitals are checked and there is no significant change in BP with change in position. BP is normal.  GENERAL:  Well appearing, overweight WM in NAD HEENT:  PERRL, EOMI, sclera are clear. Oropharynx is clear. NECK:  No jugular venous distention, carotid upstroke brisk and symmetric, no bruits, no thyromegaly or adenopathy LUNGS:  Clear to auscultation bilaterally CHEST:  Unremarkable HEART:  IRRR,  PMI not displaced or sustained,S1 and S2 within normal limits, no S3, no S4: no clicks, no rubs, no murmurs ABD:  Soft, nontender. BS +, no masses or bruits. No hepatomegaly, no splenomegaly EXT:  2 + pulses throughout, no edema, no cyanosis no clubbing SKIN:  Warm and dry.  No rashes NEURO:  Alert and oriented x 3. Cranial nerves II through XII intact. PSYCH:  Cognitively intact       EKG:  EKG is  ordered today. It shows Afib with rate 56. Low voltage. Nonspecific TWA. I have personally reviewed and interpreted this study.    Recent Labs: 07/07/2017: BUN 20; Creatinine, Ser 1.34; Hemoglobin 18.1; Platelets 161; Potassium 4.7; Sodium 142    Lipid Panel    Component Value Date/Time   CHOL 111 11/04/2012 1125   TRIG 104 11/04/2012 1125   HDL 35 (L) 11/04/2012 1125   CHOLHDL 3.2 11/04/2012 1125   VLDL 21 11/04/2012 1125   LDLCALC 55 11/04/2012 1125      Wt Readings from Last 3 Encounters:  02/08/18 286 lb (129.7 kg)    01/19/18 287 lb (130.2 kg)  09/22/17 281 lb 6.4 oz (127.6 kg)    Labs dated 01/11/17: normal UA and CBC. TSH 0.08. Free T4 normal.  Dated 02/19/17; A1c 6%.   Other studies Reviewed: Additional studies/ records that were reviewed today include:   Echo 11/04/12: Study Conclusions  Left ventricle: The cavity size was normal. Wall thickness was increased in a pattern of mild LVH. Systolic function was normal. The estimated ejection fraction was in the range of 55% to 60%.       Event  monitor 2014- occ isolated PVCs. No Afib.     Echo 03/29/17: Study Conclusions  - Left ventricle: The cavity size was normal. There was mild   concentric hypertrophy. Systolic function was normal. The   estimated ejection fraction was in the range of 60% to 65%.   Although no diagnostic regional wall motion abnormality was   identified, this possibility cannot be completely excluded on the   basis of this study. - Aortic valve: Moderate focal calcification involving the right   coronary and left coronary cusp. There was very mild stenosis.   Valve area (VTI): 1.72 cm^2. Valve area (Vmax): 1.62 cm^2. Valve   area (Vmean): 1.43 cm^2. - Left atrium: The atrium was severely dilated. - Right atrium: The atrium was moderately to severely dilated. - Atrial septum: No defect or patent foramen ovale was identified. - Pulmonary arteries: Systolic pressure was mildly increased. PA   peak pressure: 35 mm Hg (S).  ASSESSMENT AND PLAN:  1.  Atrial fibrillation persistent.  Rate is controlled on no rate slowing medication. Confirmed by Holter monitors done in May and July 2019. He is not really symptomatic from a rhythm standpoint and symptoms do not correlate with change in BP or HR. [place] Vasc score of 2. Recommend anticoagulation for reduction of stroke risk. Continue Eliquis long term. Do not take ASA or NSAIDs.  2. HTN controlled.  3. History of hyperthyroidism. Followed by Endocrinology- Dr. Bernie Covey. On  Methimazole.  4. 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 Not a candidate for ablation due to location next to His bundle.  5. BPV continue vestibular exercises.   Mr. Lashley symptoms are difficult to categorize. He has a number of complaints without clear cardiac etiology. I would go ahead and pursue outpatient sleep study next month. Continue current cardiac therapy. Follow up in 6 months.   Follow up in one year.  Signed, Darbie Biancardi [name], MD  02/08/2018 11:17 AM    [facility] [address] [place], [place], [address] Phone [phone], Fax [phone]

## 2018-03-27 ENCOUNTER — Other Ambulatory Visit: Payer: Self-pay | Admitting: Cardiology

## 2018-04-27 ENCOUNTER — Other Ambulatory Visit: Payer: Self-pay | Admitting: Cardiology

## 2018-05-30 ENCOUNTER — Other Ambulatory Visit: Payer: Self-pay | Admitting: Cardiology

## 2018-05-30 NOTE — Telephone Encounter (Signed)
Pt is a 68 yr old male who last saw Dr. [name] on 02/08/18, wt at that visit was 129.7Kg. SCr on 07/07/17 was 1.34. Will refill Eliquis 5mg  BID.

## 2018-07-21 ENCOUNTER — Emergency Department (HOSPITAL_BASED_OUTPATIENT_CLINIC_OR_DEPARTMENT_OTHER): Payer: Medicare Other

## 2018-07-21 ENCOUNTER — Encounter (HOSPITAL_BASED_OUTPATIENT_CLINIC_OR_DEPARTMENT_OTHER): Payer: Self-pay | Admitting: *Deleted

## 2018-07-21 ENCOUNTER — Emergency Department (HOSPITAL_BASED_OUTPATIENT_CLINIC_OR_DEPARTMENT_OTHER)
Admission: EM | Admit: 2018-07-21 | Discharge: 2018-07-21 | Disposition: A | Discharge status: Home / Self Care | Payer: Medicare Other | Attending: Emergency Medicine | Admitting: Emergency Medicine

## 2018-07-21 ENCOUNTER — Other Ambulatory Visit: Payer: Self-pay

## 2018-07-21 DIAGNOSIS — Z7984 Long term (current) use of oral hypoglycemic drugs: Secondary | ICD-10-CM | POA: Diagnosis not present

## 2018-07-21 DIAGNOSIS — R42 Dizziness and giddiness: Secondary | ICD-10-CM

## 2018-07-21 DIAGNOSIS — I10 Essential (primary) hypertension: Secondary | ICD-10-CM | POA: Diagnosis not present

## 2018-07-21 DIAGNOSIS — E119 Type 2 diabetes mellitus without complications: Secondary | ICD-10-CM | POA: Diagnosis not present

## 2018-07-21 DIAGNOSIS — Z79899 Other long term (current) drug therapy: Secondary | ICD-10-CM | POA: Diagnosis not present

## 2018-07-21 DIAGNOSIS — J449 Chronic obstructive pulmonary disease, unspecified: Secondary | ICD-10-CM | POA: Insufficient documentation

## 2018-07-21 DIAGNOSIS — R531 Weakness: Secondary | ICD-10-CM

## 2018-07-21 LAB — COMPREHENSIVE METABOLIC PANEL
ALT: 24 U/L (ref 0–44)
AST: 21 U/L (ref 15–41)
Albumin: 4.2 g/dL (ref 3.5–5.0)
Alkaline Phosphatase: 43 U/L (ref 38–126)
Anion gap: 7 (ref 5–15)
BUN: 23 mg/dL (ref 8–23)
CO2: 24 mmol/L (ref 22–32)
Calcium: 8.9 mg/dL (ref 8.9–10.3)
Chloride: 105 mmol/L (ref 98–111)
Creatinine, Ser: 0.96 mg/dL (ref 0.61–1.24)
GFR calc Af Amer: >60 mL/min (ref >60)
GFR calc non Af Amer: >60 mL/min (ref >60)
Glucose, Bld: 100 mg/dL — ABNORMAL HIGH (ref 70–99)
Potassium: 3.9 mmol/L (ref 3.5–5.1)
Sodium: 136 mmol/L (ref 135–145)
Total Bilirubin: 1 mg/dL (ref 0.3–1.2)
Total Protein: 6.5 g/dL (ref 6.5–8.1)

## 2018-07-21 LAB — CBC WITH DIFFERENTIAL/PLATELET
Abs Immature Granulocytes: 0.01 10*3/uL (ref 0.00–0.07)
Basophils Absolute: 0.1 10*3/uL (ref 0.0–0.1)
Basophils Relative: 1 %
Eosinophils Absolute: 0.2 10*3/uL (ref 0.0–0.5)
Eosinophils Relative: 3 %
HCT: 50.7 % (ref 39.0–52.0)
Hemoglobin: 16.5 g/dL (ref 13.0–17.0)
Immature Granulocytes: 0 %
Lymphocytes Relative: 23 %
Lymphs Abs: 1.5 10*3/uL (ref 0.7–4.0)
MCH: 31.9 pg (ref 26.0–34.0)
MCHC: 32.5 g/dL (ref 30.0–36.0)
MCV: 98.1 fL (ref 80.0–100.0)
Monocytes Absolute: 0.7 10*3/uL (ref 0.1–1.0)
Monocytes Relative: 11 %
Neutro Abs: 3.9 10*3/uL (ref 1.7–7.7)
Neutrophils Relative %: 62 %
Platelets: 173 10*3/uL (ref 150–400)
RBC: 5.17 MIL/uL (ref 4.22–5.81)
RDW: 12.9 % (ref 11.5–15.5)
WBC: 6.3 10*3/uL (ref 4.0–10.5)
nRBC: 0 % (ref 0.0–0.2)

## 2018-07-21 LAB — URINALYSIS, MICROSCOPIC (REFLEX)

## 2018-07-21 LAB — URINALYSIS, ROUTINE W REFLEX MICROSCOPIC
Bilirubin Urine: NEGATIVE
Glucose, UA: NEGATIVE mg/dL
Ketones, ur: NEGATIVE mg/dL
Leukocytes,Ua: NEGATIVE
Nitrite: NEGATIVE
Protein, ur: NEGATIVE mg/dL
Specific Gravity, Urine: 1.01 (ref 1.005–1.030)
pH: 6 (ref 5.0–8.0)

## 2018-07-21 MED ORDER — SODIUM CHLORIDE 0.9 % IV SOLN: INTRAVENOUS | Status: DC

## 2018-07-21 MED ORDER — SODIUM CHLORIDE 0.9 % IV BOLUS
500.0000 mL | Freq: Once | INTRAVENOUS | Status: AC
  Administered 2018-07-21: 16:00:00 500 mL via INTRAVENOUS

## 2018-07-21 NOTE — ED Provider Notes (Signed)
[facility] EMERGENCY DEPARTMENT Provider Note   CSN: [id number] Arrival date & time: 07/21/18  1447    History   Chief Complaint Chief Complaint  Patient presents with  . Dizziness    HPI Michael Whitney is a 68 y.o. male.     Patient presenting with a complaint of dizziness.  There is no true room spinning.  Associated with lightheadedness and feeling like he is got a pass out and generalized weakness ongoing for over a month.  Patient seen by primary care provider 2 days ago with lab work-up and they felt maybe he was dehydrated.  He was told to stop 2 of his medications.  1 was the metformin.  And the other was the losartan.  Patient is on Eliquis for atrial fibrillation.  Patient denies any chest pain or shortness of breath.  No numbness no speech problems no headache.  Patient had similar symptoms in 2014 had MRI/MRA at that time without any specific cause for the dizziness.     Past Medical History:  Diagnosis Date  . Cataract   . Hypertension   . Seizures [facility])     Patient Active Problem List   Diagnosis Date Noted  . PVC (premature ventricular contraction) 07/14/2017  . Type 2 diabetes mellitus without complication, without long-term current use of insulin ([place]) 06/29/2017  . Persistent atrial fibrillation 06/17/2017  . Neck pain 12/04/2016  . Severe obesity (BMI 35.0-35.9 with comorbidity) ([place]) 09/06/2015  . Essential hypertension 02/07/2015  . Cataract 02/07/2015  . Allergic rhinitis 02/07/2015  . COPD (chronic obstructive pulmonary disease) ([place]) 02/07/2015  . Hyperthyroidism 02/07/2015  . Impaired fasting glucose 02/07/2015  . Sensorineural hearing loss (SNHL), bilateral 02/07/2015  . Simple chronic bronchitis ([place]) 02/07/2015  . Varicose veins of both lower extremities 02/07/2015  . Hypertension associated with diabetes ([place]) 02/07/2015  . Alcohol abuse 01/22/2015  . Cognitive and behavioral changes 01/22/2015  . Seizure ([place]) 01/22/2015  .  Spell of loss of consciousness 11/17/2012  . Abnormal EEG 11/05/2012  . Syncope 11/05/2012  . Thrombocytopenia ([place]) 11/05/2012  . Anxiety 11/03/2012  . Dizziness and giddiness 11/03/2012    Past Surgical History:  Procedure Laterality Date  . CATARACT EXTRACTION    . PVC ABLATION N/A 07/14/2017   Procedure: PVC ABLATION;  Surgeon: Evans Lance, MD;  Location: [facility];  Service: Cardiovascular;  Laterality: N/A;  . TONSILLECTOMY    . VARICOSE VEIN SURGERY          Home Medications    Prior to Admission medications   Medication Sig Start Date End Date Taking? Authorizing Provider  BIOTIN PO Take 1 tablet by mouth daily.   Yes [provider]  Calcium Carb-Cholecalciferol (CALCIUM 600+D3 PO) Take 1 tablet by mouth daily.   Yes [provider]  ELIQUIS 5 MG TABS tablet Take 1 tablet by mouth twice daily 05/30/18  Yes [name], Peter M, MD  fluticasone [facility]) 50 MCG/ACT nasal spray Place 2 sprays into both nostrils daily as needed for allergies.    Yes [provider]  loratadine (CLARITIN) 10 MG tablet Take 10 mg by mouth daily.   Yes [provider]  losartan (COZAAR) 25 MG tablet Take 25 mg by mouth daily.   Yes [provider]  metFORMIN (GLUCOPHAGE-XR) 500 MG 24 hr tablet Take 500 mg by mouth daily with breakfast.   Yes [provider]  methimazole (TAPAZOLE) 5 MG tablet Take 5 mg by mouth daily. 05/17/17  Yes [provider]  Multiple Vitamin (MULTIVITAMIN WITH MINERALS) TABS tablet Take 1 tablet by mouth daily.   Yes [provider]  Omega-3 Fatty Acids (FISH OIL) 1000 MG CAPS Take 1,000 mg by mouth daily.   Yes [provider]  metFORMIN (GLUCOPHAGE-XR) 750 MG 24 hr tablet Take 1 tablet by mouth daily. 09/07/17   [provider]    Family History Family History  Problem Relation Age of Onset  . CAD Mother        MI at age 30    Social History Social History   Tobacco  Use  . Smoking status: Never Smoker  . Smokeless tobacco: Never Used  Substance Use Topics  . Alcohol use: Yes    Alcohol/week: 2.0 standard drinks    Types: 1 Shots of liquor, 1 Cans of beer per week    Comment: occasionally  . Drug use: No     Allergies   Patient has no known allergies.   Review of Systems Review of Systems  Constitutional: Negative for chills and fever.  HENT: Negative for congestion, rhinorrhea and sore throat.   Eyes: Negative for visual disturbance.  Respiratory: Negative for cough and shortness of breath.   Cardiovascular: Negative for chest pain and leg swelling.  Gastrointestinal: Negative for abdominal pain, diarrhea, nausea and vomiting.  Genitourinary: Negative for dysuria.  Musculoskeletal: Negative for back pain and neck pain.  Skin: Negative for rash.  Neurological: Positive for dizziness and light-headedness. Negative for syncope, facial asymmetry, weakness, numbness and headaches.  Hematological: Does not bruise/bleed easily.  Psychiatric/Behavioral: Negative for confusion.     Physical Exam Updated Vital Signs Ht 1.905 m (6\' 3" )   Wt 129.7 kg   BMI 35.75 kg/m   Physical Exam Vitals signs and nursing note reviewed.  Constitutional:      General: He is not in acute distress.    Appearance: Normal appearance. He is well-developed.  HENT:     Head: Normocephalic and atraumatic.  Eyes:     Extraocular Movements: Extraocular movements intact.     Conjunctiva/sclera: Conjunctivae normal.     Pupils: Pupils are equal, round, and reactive to light.  Neck:     Musculoskeletal: Normal range of motion and neck supple.  Cardiovascular:     Rate and Rhythm: Regular rhythm. Tachycardia present.     Heart sounds: No murmur.  Pulmonary:     Effort: Pulmonary effort is normal. No respiratory distress.     Breath sounds: Normal breath sounds.  Abdominal:     General: Bowel sounds are normal.     Palpations: Abdomen is soft.     Tenderness:  There is no abdominal tenderness.  Musculoskeletal: Normal range of motion.        General: No swelling.  Skin:    General: Skin is warm and dry.     Capillary Refill: Capillary refill takes less than 2 seconds.  Neurological:     General: No focal deficit present.     Mental Status: He is alert and oriented to person, place, and time.     Cranial Nerves: No cranial nerve deficit.     Sensory: No sensory deficit.     Motor: No weakness.      ED Treatments / Results  Labs (all labs ordered are listed, but only abnormal results are displayed) Labs Reviewed  COMPREHENSIVE METABOLIC PANEL - Abnormal; Notable for the following components:      Result Value   Glucose, Bld 100 (*)  All other components within normal limits  URINALYSIS, ROUTINE W REFLEX MICROSCOPIC - Abnormal; Notable for the following components:   Hgb urine dipstick SMALL (*)    All other components within normal limits  URINALYSIS, MICROSCOPIC (REFLEX) - Abnormal; Notable for the following components:   Bacteria, UA RARE (*)    All other components within normal limits  CBC WITH DIFFERENTIAL/PLATELET    EKG EKG Interpretation  Date/Time:  Thursday Jul 21 2018 14:56:41 EDT Ventricular Rate:  68 PR Interval:    QRS Duration: 134 QT Interval:  410 QTC Calculation: 435 R Axis:   -65 Text Interpretation:  Atrial fibrillation Left axis deviation Right bundle branch block Abnormal ECG Confirmed by Fredia Sorrow [phone]) on 07/21/2018 3:03:49 PM   Radiology Dg Chest 2 View  Result Date: 07/21/2018 CLINICAL DATA:  Dizziness and weakness.  Ex-smoker EXAM: CHEST - 2 VIEW COMPARISON:  Radiograph 12/11/2012 FINDINGS: Normal cardiac silhouette ectatic aorta. Band of atelectasis in the lingula. No focal consolidation. No pleural fluid. No pneumothorax. Degenerative osteophytosis of the spine. IMPRESSION: Lingular atelectasis.  No acute findings. Electronically Signed   By: Suzy Bouchard M.D.   On: 07/21/2018 16:12    Ct Head Wo Contrast  Result Date: 07/21/2018 CLINICAL DATA:  Ataxia. EXAM: CT HEAD WITHOUT CONTRAST TECHNIQUE: Contiguous axial images were obtained from the base of the skull through the vertex without intravenous contrast. COMPARISON:  MRI head 01/22/2015 FINDINGS: Brain: Mild atrophy. Negative for acute infarct, hemorrhage, mass. Minimal changes in the white matter. Vascular: Negative for hyperdense vessel Skull: Negative Sinuses/Orbits: Negative Other: None IMPRESSION: No acute intracranial abnormality. Electronically Signed   By: Franchot Gallo M.D.   On: 07/21/2018 15:49    Procedures Procedures (including critical care time)  Medications Ordered in ED Medications  0.9 %  sodium chloride infusion ( Intravenous Rate/Dose Change 07/21/18 1627)  sodium chloride 0.9 % bolus 500 mL ( Intravenous Stopped 07/21/18 1624)     Initial Impression / Assessment and Plan / ED Course  I have reviewed the triage vital signs and the nursing notes.  Pertinent labs & imaging results that were available during my care of the patient were reviewed by me and considered in my medical decision making (see chart for details).        Patient have a complaint of dizziness for at least a month.  Associated with some generalized weakness in the lightheadedness and feeling like he is in a pass out but no syncope.  No specific motor weakness.  No numbness.  No fevers no upper respiratory symptoms.  No pain.  Work-up here today without any acute findings include CT head, chest x-ray, labs.  Recommend follow back up with primary care doctor consideration for MRI brain.  Clinical concern for stroke is low since symptoms been ongoing for over a month and head CT was negative here today.  Patient was evaluated in 2014 for very similar symptoms with negative MRI/MRA.  Exact cause of the symptoms at that time was never clear.  Cardiac monitoring here without any arrhythmia.  EKG shows atrial fibrillation but patient with  known history of atrial fibrillation.  Patient is on Eliquis.  Cardiac monitoring and EKG shows rate controlled.  Final Clinical Impressions(s) / ED Diagnoses   Final diagnoses:  Dizziness    ED Discharge Orders    None       Fredia Sorrow, MD 07/21/18 [phone]

## 2018-07-21 NOTE — ED Notes (Signed)
ED Provider at bedside. 

## 2018-07-21 NOTE — Discharge Instructions (Addendum)
Work-up here today to include CT head and labs without any significant recommend he follow back up with your primary care doctor.  Would have them consider MRI brain for further evaluation.  Return for any new or worse symptoms.  No evidence of any significant dehydration here today.

## 2018-07-21 NOTE — ED Notes (Signed)
Patient transported to CT 

## 2018-07-21 NOTE — ED Triage Notes (Signed)
Dizziness. He was seen by his MD 2 days ago and told he was dehydrated. He was told to stop 2 medications and increase his po intake. His symptoms are no better today.

## 2018-10-11 NOTE — Progress Notes (Deleted)
Cardiology Office Note   Date:  10/11/2018   ID:  Michael Whitney, DOB 11-06-1950, MRN [id number]  PCP:  Jonathon Bellows, PA-C  Cardiologist:   Beauford Lando [name], MD   No chief complaint on file.     History of Present Illness: Michael Whitney is a 68 y.o. male is seen for evaluation of fatigue and feeling faint.  He has a history of Afib. He has a history of HTN and seizure disorder. He reported a 5 year history of dizziness. In 2014 he had evaluation with Ecg and event monitor that were unremarkable. He does have a history of seizure disorder - he only knows of one seizure and had been on Keppra- later discontinued. He does have a history of hyperthyroidism.   When initially seen in January 2019 he had a number of complaints including lightheadedness, fatigue, dizziness, 50 lb weight gain, itchy skin, and runny nose. Some intermitted swelling in right ankle. 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 Felt bloated. He was found to be in Afib with a controlled rate. Duration unknown. He was started on anticoagulation.   On follow up with me in February 2019 he seemed to be doing well and a strategy of rate control and anticoagulation was adopted. He later developed significant dizziness and fatigue. A holter monitor was done showing Afib with slow VR. He had frequent PVCs a heavy PVC burden. He underwent EP study by Dr. 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 Ablation not recommended due to concern about possible heart block. He was prescribed mexilitene. On follow up in July Dr Lovena Le noted his symptoms were  improved and it was unclear whether patient was taking medication.   In August he again complained of dizziness with normal BP. Follow up with Neurology recommended. He was seen in June by Neurology who felt he had BPV and vestibular training recommended.   When seen January 19, 2018 he was doing better. He was still having  some dizziness but the vestibular training seemed to have  helped.  He had follow up thyroid studies on Dec 4 and has mild hyperthyroidism. Methimazole dose increased.  Creatinine 1.2. magnesium normal. He is scheduled for a sleep study next month.  On follow up today he has a number of complaints again. Feels SOB. Notes numbness bilaterally in his arms. At a movie he felt a draining sensation in his left head and thought he might pass out. He has been monitoring his BP and is ranges from 254-270 systolic and 62-37 diastolic. HR 52-82. Feels fatigued.       Past Medical History:  Diagnosis Date  . Cataract   . Hypertension   . Seizures ([place])     Past Surgical History:  Procedure Laterality Date  . CATARACT EXTRACTION    . PVC ABLATION N/A 07/14/2017   Procedure: PVC ABLATION;  Surgeon: Evans Lance, MD;  Location: [facility];  Service: Cardiovascular;  Laterality: N/A;  . TONSILLECTOMY    . VARICOSE VEIN SURGERY       Current Outpatient Medications  Medication Sig Dispense Refill  . BIOTIN PO Take 1 tablet by mouth daily.    . Calcium Carb-Cholecalciferol (CALCIUM 600+D3 PO) Take 1 tablet by mouth daily.    Marland Kitchen ELIQUIS 5 MG TABS tablet Take 1 tablet by mouth twice daily 60 tablet 6  . fluticasone (FLONASE) 50 MCG/ACT nasal spray Place 2 sprays into both nostrils daily as needed for allergies.     Marland Kitchen  loratadine (CLARITIN) 10 MG tablet Take 10 mg by mouth daily.    Marland Kitchen losartan (COZAAR) 25 MG tablet Take 25 mg by mouth daily.    . metFORMIN (GLUCOPHAGE-XR) 500 MG 24 hr tablet Take 500 mg by mouth daily with breakfast.    . metFORMIN (GLUCOPHAGE-XR) 750 MG 24 hr tablet Take 1 tablet by mouth daily.  5  . methimazole (TAPAZOLE) 5 MG tablet Take 5 mg by mouth daily.  2  . Multiple Vitamin (MULTIVITAMIN WITH MINERALS) TABS tablet Take 1 tablet by mouth daily.    . Omega-3 Fatty Acids (FISH OIL) 1000 MG CAPS Take 1,000 mg by mouth daily.     No current facility-administered medications for this visit.     Allergies:   Patient has no  known allergies.    Social History:  The patient  reports that he has never smoked. He has never used smokeless tobacco. He reports current alcohol use of about 2.0 standard drinks of alcohol per week. He reports that he does not use drugs.   Family History:  The patient's family history includes CAD in his mother.    ROS:  Please see the history of present illness- diffusely positive.   Otherwise, review of systems are positive for none.   All other systems are reviewed and negative.    PHYSICAL EXAM: VS:  There were no vitals taken for this visit. , BMI There is no height or weight on file to calculate BMI.   Orthostatic vitals are checked and there is no significant change in BP with change in position. BP is normal.  GENERAL:  Well appearing, overweight WM in NAD HEENT:  PERRL, EOMI, sclera are clear. Oropharynx is clear. NECK:  No jugular venous distention, carotid upstroke brisk and symmetric, no bruits, no thyromegaly or adenopathy LUNGS:  Clear to auscultation bilaterally CHEST:  Unremarkable HEART:  IRRR,  PMI not displaced or sustained,S1 and S2 within normal limits, no S3, no S4: no clicks, no rubs, no murmurs ABD:  Soft, nontender. BS +, no masses or bruits. No hepatomegaly, no splenomegaly EXT:  2 + pulses throughout, no edema, no cyanosis no clubbing SKIN:  Warm and dry.  No rashes NEURO:  Alert and oriented x 3. Cranial nerves II through XII intact. PSYCH:  Cognitively intact       EKG:  EKG is  ordered today. It shows Afib with rate 56. Low voltage. Nonspecific TWA. I have personally reviewed and interpreted this study.    Recent Labs: 07/21/2018: ALT 24; BUN 23; Creatinine, Ser 0.96; Hemoglobin 16.5; Platelets 173; Potassium 3.9; Sodium 136    Lipid Panel    Component Value Date/Time   CHOL 111 11/04/2012 1125   TRIG 104 11/04/2012 1125   HDL 35 (L) 11/04/2012 1125   CHOLHDL 3.2 11/04/2012 1125   VLDL 21 11/04/2012 1125   LDLCALC 55 11/04/2012 1125       Wt Readings from Last 3 Encounters:  07/21/18 286 lb (129.7 kg)  02/08/18 286 lb (129.7 kg)  01/19/18 287 lb (130.2 kg)    Labs dated 01/11/17: normal UA and CBC. TSH 0.08. Free T4 normal.  Dated 02/19/17; A1c 6%.   Other studies Reviewed: Additional studies/ records that were reviewed today include:   Echo 11/04/12: Study Conclusions  Left ventricle: The cavity size was normal. Wall thickness was increased in a pattern of mild LVH. Systolic function was normal. The estimated ejection fraction was in the range of 55% to 60%.  Event monitor 2014- occ isolated PVCs. No Afib.     Echo 03/29/17: Study Conclusions  - Left ventricle: The cavity size was normal. There was mild   concentric hypertrophy. Systolic function was normal. The   estimated ejection fraction was in the range of 60% to 65%.   Although no diagnostic regional wall motion abnormality was   identified, this possibility cannot be completely excluded on the   basis of this study. - Aortic valve: Moderate focal calcification involving the right   coronary and left coronary cusp. There was very mild stenosis.   Valve area (VTI): 1.72 cm^2. Valve area (Vmax): 1.62 cm^2. Valve   area (Vmean): 1.43 cm^2. - Left atrium: The atrium was severely dilated. - Right atrium: The atrium was moderately to severely dilated. - Atrial septum: No defect or patent foramen ovale was identified. - Pulmonary arteries: Systolic pressure was mildly increased. PA   peak pressure: 35 mm Hg (S).  ASSESSMENT AND PLAN:  1.  Atrial fibrillation persistent.  Rate is controlled on no rate slowing medication. Confirmed by Holter monitors done in May and July 2019. He is not really symptomatic from a rhythm standpoint and symptoms do not correlate with change in BP or HR. [place] Vasc score of 2. Recommend anticoagulation for reduction of stroke risk. Continue Eliquis long term. Do not take ASA or NSAIDs.  2. HTN controlled.  3. History of  hyperthyroidism. Followed by Endocrinology- Dr. Bernie Covey. On Methimazole.  4. 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 Not a candidate for ablation due to location next to His bundle.  5. BPV continue vestibular exercises.   Michael Whitney symptoms are difficult to categorize. He has a number of complaints without clear cardiac etiology. I would go ahead and pursue outpatient sleep study next month. Continue current cardiac therapy. Follow up in 6 months.   Follow up in one year.  Signed, Aylen Rambert [name], MD  10/11/2018 4:35 PM    [facility] [address], [place], [place], [address] Phone [phone], Fax [phone]

## 2018-10-12 ENCOUNTER — Ambulatory Visit: Payer: Medicare Other | Admitting: Cardiology

## 2018-11-02 ENCOUNTER — Emergency Department (HOSPITAL_COMMUNITY): Payer: Medicare Other

## 2018-11-02 ENCOUNTER — Encounter (HOSPITAL_COMMUNITY): Payer: Self-pay | Admitting: Emergency Medicine

## 2018-11-02 ENCOUNTER — Emergency Department (HOSPITAL_COMMUNITY)
Admission: EM | Admit: 2018-11-02 | Discharge: 2018-11-02 | Disposition: A | Discharge status: Home / Self Care | Payer: Medicare Other | Attending: Emergency Medicine | Admitting: Emergency Medicine

## 2018-11-02 ENCOUNTER — Other Ambulatory Visit: Payer: Self-pay

## 2018-11-02 DIAGNOSIS — J449 Chronic obstructive pulmonary disease, unspecified: Secondary | ICD-10-CM | POA: Insufficient documentation

## 2018-11-02 DIAGNOSIS — E039 Hypothyroidism, unspecified: Secondary | ICD-10-CM | POA: Insufficient documentation

## 2018-11-02 DIAGNOSIS — E119 Type 2 diabetes mellitus without complications: Secondary | ICD-10-CM | POA: Diagnosis not present

## 2018-11-02 DIAGNOSIS — Z7984 Long term (current) use of oral hypoglycemic drugs: Secondary | ICD-10-CM | POA: Insufficient documentation

## 2018-11-02 DIAGNOSIS — Z7901 Long term (current) use of anticoagulants: Secondary | ICD-10-CM | POA: Insufficient documentation

## 2018-11-02 DIAGNOSIS — Z79899 Other long term (current) drug therapy: Secondary | ICD-10-CM | POA: Diagnosis not present

## 2018-11-02 DIAGNOSIS — R200 Anesthesia of skin: Secondary | ICD-10-CM

## 2018-11-02 DIAGNOSIS — I4891 Unspecified atrial fibrillation: Secondary | ICD-10-CM | POA: Diagnosis not present

## 2018-11-02 DIAGNOSIS — I10 Essential (primary) hypertension: Secondary | ICD-10-CM | POA: Diagnosis not present

## 2018-11-02 HISTORY — DX: Unspecified atrial fibrillation: I48.91

## 2018-11-02 LAB — COMPREHENSIVE METABOLIC PANEL
ALT: 26 U/L (ref 0–44)
AST: 25 U/L (ref 15–41)
Albumin: 3.8 g/dL (ref 3.5–5.0)
Alkaline Phosphatase: 44 U/L (ref 38–126)
Anion gap: 11 (ref 5–15)
BUN: 12 mg/dL (ref 8–23)
CO2: 21 mmol/L — ABNORMAL LOW (ref 22–32)
Calcium: 8.8 mg/dL — ABNORMAL LOW (ref 8.9–10.3)
Chloride: 106 mmol/L (ref 98–111)
Creatinine, Ser: 1 mg/dL (ref 0.61–1.24)
GFR calc Af Amer: >60 mL/min (ref >60)
GFR calc non Af Amer: >60 mL/min (ref >60)
Glucose, Bld: 111 mg/dL — ABNORMAL HIGH (ref 70–99)
Potassium: 4.4 mmol/L (ref 3.5–5.1)
Sodium: 138 mmol/L (ref 135–145)
Total Bilirubin: 0.7 mg/dL (ref 0.3–1.2)
Total Protein: 6.2 g/dL — ABNORMAL LOW (ref 6.5–8.1)

## 2018-11-02 LAB — DIFFERENTIAL
Abs Immature Granulocytes: 0 10*3/uL (ref 0.00–0.07)
Basophils Absolute: 0 10*3/uL (ref 0.0–0.1)
Basophils Relative: 1 %
Eosinophils Absolute: 0.1 10*3/uL (ref 0.0–0.5)
Eosinophils Relative: 2 %
Immature Granulocytes: 0 %
Lymphocytes Relative: 23 %
Lymphs Abs: 1.2 10*3/uL (ref 0.7–4.0)
Monocytes Absolute: 0.6 10*3/uL (ref 0.1–1.0)
Monocytes Relative: 11 %
Neutro Abs: 3.4 10*3/uL (ref 1.7–7.7)
Neutrophils Relative %: 63 %

## 2018-11-02 LAB — CBC
HCT: 48.8 % (ref 39.0–52.0)
Hemoglobin: 16.4 g/dL (ref 13.0–17.0)
MCH: 32.6 pg (ref 26.0–34.0)
MCHC: 33.6 g/dL (ref 30.0–36.0)
MCV: 97 fL (ref 80.0–100.0)
Platelets: 147 10*3/uL — ABNORMAL LOW (ref 150–400)
RBC: 5.03 MIL/uL (ref 4.22–5.81)
RDW: 12.7 % (ref 11.5–15.5)
WBC: 5.4 10*3/uL (ref 4.0–10.5)
nRBC: 0 % (ref 0.0–0.2)

## 2018-11-02 LAB — PROTIME-INR
INR: 1.2 (ref 0.8–1.2)
Prothrombin Time: 14.6 seconds (ref 11.4–15.2)

## 2018-11-02 LAB — ETHANOL: Alcohol, Ethyl (B): <10 mg/dL (ref <10)

## 2018-11-02 LAB — APTT: aPTT: 33 seconds (ref 24–36)

## 2018-11-02 NOTE — ED Notes (Signed)
Discharge instructions discussed with pt. Pt verbalized understanding. Pt stable and ambulatory. No signature pad available. 

## 2018-11-02 NOTE — Discharge Instructions (Addendum)
Return for worsening symptoms, one sided weakness, difficulty with speech or swallowing.  If you develop a rash please call your doctor so they can treat you for shingles if appropriate.

## 2018-11-02 NOTE — ED Notes (Signed)
Patient transported to MRI 

## 2018-11-02 NOTE — ED Triage Notes (Signed)
Pt BIB [facility] from home. Pt complaining of left sided facial numbness that began last night. Worse this morning. Pt is on eliquis for afib. No falls reported. Pt BP is elevated 99991111 systolic with EMS. Cbg 155 with EMS.

## 2018-11-02 NOTE — ED Provider Notes (Signed)
[facility] EMERGENCY DEPARTMENT Provider Note   CSN: [id number] Arrival date & time: 11/02/18  1211     History   Chief Complaint Chief Complaint  Patient presents with  . Numbness    HPI Michael Whitney is a 68 y.o. male with history of atrial fibrillation on Eliquis, HTN, hyperthyroidism on methimazole, DM on oral medicines, remote history of ETOH use, seizures, BPPV presents to the ER for evaluation of left-sided numbness.  Described as loss of sensation like he has Novocain on the left side of his face.  He first noticed it around 7 8 PM last night it was mild.  He woke up this morning around 7 AM and noticed significantly worsening loss of sensation.  This was located in the left cheekbone and went up to the forehead and the jaw but states currently the sensation has improved and it only feels numb on his left cheekbone.  He denies any other symptoms.  Reports long history of lightheadedness when standing up and feeling like he is going to pass out but attributes this to his long history of vertigo that he has gone to therapy for.  He denies any associated fever, vision changes or loss, diplopia, neck pain, dental pain, stroke symptoms such as difficulty swallowing, difficulty with speech, facial drooping, one-sided weakness, difficulty with balance.  He called his PCP and triage nurse advised him to come to the ER.  States he has had MRIs of his brain and CTs of his neck recently for ongoing neck pain and BPPV.  Interventions.  No alleviating factors.    HPI  Past Medical History:  Diagnosis Date  . Atrial fibrillation ([place])   . Cataract   . Hypertension   . Seizures [facility])     Patient Active Problem List   Diagnosis Date Noted  . PVC (premature ventricular contraction) 07/14/2017  . Type 2 diabetes mellitus without complication, without long-term current use of insulin ([place]) 06/29/2017  . Persistent atrial fibrillation 06/17/2017  . Neck pain 12/04/2016  .  Severe obesity (BMI 35.0-35.9 with comorbidity) ([place]) 09/06/2015  . Essential hypertension 02/07/2015  . Cataract 02/07/2015  . Allergic rhinitis 02/07/2015  . COPD (chronic obstructive pulmonary disease) ([place]) 02/07/2015  . Hyperthyroidism 02/07/2015  . Impaired fasting glucose 02/07/2015  . Sensorineural hearing loss (SNHL), bilateral 02/07/2015  . Simple chronic bronchitis ([place]) 02/07/2015  . Varicose veins of both lower extremities 02/07/2015  . Hypertension associated with diabetes ([place]) 02/07/2015  . Alcohol abuse 01/22/2015  . Cognitive and behavioral changes 01/22/2015  . Seizure ([place]) 01/22/2015  . Spell of loss of consciousness 11/17/2012  . Abnormal EEG 11/05/2012  . Syncope 11/05/2012  . Thrombocytopenia ([place]) 11/05/2012  . Anxiety 11/03/2012  . Dizziness and giddiness 11/03/2012    Past Surgical History:  Procedure Laterality Date  . CATARACT EXTRACTION    . PVC ABLATION N/A 07/14/2017   Procedure: PVC ABLATION;  Surgeon: Evans Lance, MD;  Location: [facility];  Service: Cardiovascular;  Laterality: N/A;  . TONSILLECTOMY    . VARICOSE VEIN SURGERY          Home Medications    Prior to Admission medications   Medication Sig Start Date End Date Taking? Authorizing Provider  BIOTIN PO Take 1 tablet by mouth daily.   Yes [provider]  Calcium Carb-Cholecalciferol (CALCIUM 600+D3 PO) Take 1 tablet by mouth daily.   Yes [provider]  ELIQUIS 5 MG TABS tablet Take 1 tablet  by mouth twice daily Patient taking differently: Take 5 mg by mouth 2 (two) times daily.  05/30/18  Yes [name], Peter M, MD  fluticasone [facility]) 50 MCG/ACT nasal spray Place 2 sprays into both nostrils daily as needed for allergies.    Yes [provider]  loratadine (CLARITIN) 10 MG tablet Take 10 mg by mouth daily as needed for allergies.    Yes [provider]  losartan (COZAAR) 25 MG tablet Take 12.5 mg by mouth daily.    Yes [provider]  metFORMIN (GLUCOPHAGE-XR) 500 MG 24 hr tablet Take 500 mg by mouth daily with breakfast.   Yes [provider]  methimazole (TAPAZOLE) 5 MG tablet Take 5 mg by mouth daily. 05/17/17  Yes [provider]  Multiple Vitamin (MULTIVITAMIN WITH MINERALS) TABS tablet Take 1 tablet by mouth daily.   Yes [provider]    Family History Family History  Problem Relation Age of Onset  . CAD Mother        MI at age 65    Social History Social History   Tobacco Use  . Smoking status: Never Smoker  . Smokeless tobacco: Never Used  Substance Use Topics  . Alcohol use: Yes    Alcohol/week: 2.0 standard drinks    Types: 1 Shots of liquor, 1 Cans of beer per week    Comment: occasionally  . Drug use: No     Allergies   Patient has no known allergies.   Review of Systems Review of Systems  Neurological: Positive for dizziness and numbness.  All other systems reviewed and are negative.    Physical Exam Updated Vital Signs BP (!) 155/93 (BP Location: Right Arm)   Pulse 65   Temp 98.3 F (36.8 C) (Oral)   Resp 14   Ht 6\' 3"  (1.905 m)   Wt 127 kg   SpO2 96%   BMI 35.00 kg/m   Physical Exam Vitals signs and nursing note reviewed.  Constitutional:      General: He is not in acute distress.    Appearance: He is well-developed.     Comments: NAD.  HENT:     Head: Normocephalic and atraumatic.     Right Ear: External ear normal.     Left Ear: External ear normal.     Nose: Nose normal.  Eyes:     General: No scleral icterus.    Conjunctiva/sclera: Conjunctivae normal.  Neck:     Musculoskeletal: Normal range of motion and neck supple.  Cardiovascular:     Rate and Rhythm: Normal rate. Rhythm irregular.     Heart sounds: Normal heart sounds.     Comments: HR < 100. Irregularly irregular.  Pulmonary:     Effort: Pulmonary effort is normal.     Breath sounds: Normal breath sounds.  Musculoskeletal: Normal range of motion.         General: No deformity.  Skin:    General: Skin is warm and dry.     Capillary Refill: Capillary refill takes less than 2 seconds.  Neurological:     Mental Status: He is alert and oriented to person, place, and time.     Sensory: Sensory deficit present.     Comments:  Alert and oriented to self, place, time and event.  Speech is fluent, clear without dysarthria or dysphasia.  Able to name 2 objects (pen, phone) Strength 5/5 in upper/lower extremities   Sensation intact in remaining upper/lower extremities Normal gait.  Some  sway with Romberg, able to correct. No pronator drift. No leg drop. Normal finger-to-nose and feet tapping.  CN I not tested CN II grossly intact visual fields bilaterally. Unable to visualize posterior eye. CN III, IV, VI PEERL and EOMs intact bilaterally CN V Decreased sensation to sharp and light touch in maxillary V2 division of LEFT trigeminal nerve, intact elsewhere CN VII facial movements symmetric CN VIII not tested CN IX, X no uvula deviation, symmetric rise of soft palate  CN XI 5/5 SCM and trapezius strength bilaterally  CN XII Midline tongue protrusion, symmetric L/R movements  Psychiatric:        Behavior: Behavior normal.        Thought Content: Thought content normal.        Judgment: Judgment normal.      ED Treatments / Results  Labs (all labs ordered are listed, but only abnormal results are displayed) Labs Reviewed  CBC - Abnormal; Notable for the following components:      Result Value   Platelets 147 (*)    All other components within normal limits  COMPREHENSIVE METABOLIC PANEL - Abnormal; Notable for the following components:   CO2 21 (*)    Glucose, Bld 111 (*)    Calcium 8.8 (*)    Total Protein 6.2 (*)    All other components within normal limits  ETHANOL  PROTIME-INR  APTT  DIFFERENTIAL  RAPID URINE DRUG SCREEN, HOSP PERFORMED  I-STAT CHEM 8, ED    EKG None  Radiology No results found.  Procedures Procedures  (including critical care time)  Medications Ordered in ED Medications - No data to display   Initial Impression / Assessment and Plan / ED Course  I have reviewed the triage vital signs and the nursing notes.  Pertinent labs & imaging results that were available during my care of the patient were reviewed by me and considered in my medical decision making (see chart for details).  Ddx - trigeminal neuralgia although he has numbness but no pain. Considered acute CVA less likely given no objective weakness, drooping and otherwise normal neuro exam. Exam not consistent with Bell's palsy. No dental pain.   I have reviewed pt's EMR to obtain pertinent PMH.  He has had a recent MRI/MRA of brain/head, CTA as well for work-up of dizziness.  I reviewed these and there unremarkable.  I have reviewed patient's lab work and your work-up thus far is unremarkable.  EKG shows rate controlled atrial fibrillation. No electrolyte abnormalities.   1555: Pt handed off to oncoming EDP Tyrone Nine who will f/u on MRI. Anticipate discharge if benign. Pt has established neurology as OP.   Final Clinical Impressions(s) / ED Diagnoses   Final diagnoses:  Left facial numbness    ED Discharge Orders    None       Kinnie Feil, PA-C 11/02/18 [place], Dan, DO 11/02/18 Normal, DO 11/02/18 1844

## 2018-12-29 NOTE — Progress Notes (Signed)
Virtual Visit via Telephone Note   This visit type was conducted due to national recommendations for restrictions regarding the COVID-19 Pandemic (e.g. social distancing) in an effort to limit this patient's exposure and mitigate transmission in our community.  Due to his co-morbid illnesses, this patient is at least at moderate risk for complications without adequate follow up.  This format is felt to be most appropriate for this patient at this time.  The patient did not have access to video technology/had technical difficulties with video requiring transitioning to audio format only (telephone).  All issues noted in this document were discussed and addressed.  No physical exam could be performed with this format.  Please refer to the patient's chart for his  consent to telehealth for [facility].   Date:  12/29/2018   ID:  Michael Whitney, DOB [birth year], MRN [id number]  Patient Location: Home Provider Location: Home  PCP:  Jonathon Bellows, PA-C  Cardiologist:  Peter [name] MD Electrophysiologist:  None   Evaluation Performed:  Follow-Up Visit  Chief Complaint:  Afib. PVCs  History of Present Illness:    Michael Whitney is a 68 y.o. male with a history of Afib. He has a history of HTN and seizure disorder. He reported a 5 year history of dizziness. In 2014 he had evaluation with Ecg and event monitor that were unremarkable. He does have a history of seizure disorder - he only knows of one seizure and had been on Keppra- later discontinued. He does have a history of hyperthyroidism.   When initially seen in January 2019 he had a number of complaints including lightheadedness, fatigue, dizziness, 50 lb weight gain, itchy skin, and runny nose. Some intermitted swelling in right ankle. 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 Felt bloated. He was found to be in Afib with a controlled rate. Duration unknown. He was started on anticoagulation.   On follow up with me in February 2019 he seemed  to be doing well and a strategy of rate control and anticoagulation was adopted. He later developed significant dizziness and fatigue. A holter monitor was done showing Afib with slow VR. He had frequent PVCs a heavy PVC burden. He underwent EP study by Dr. 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 Ablation not recommended due to concern about possible heart block. He was prescribed mexilitene. On follow up in July Dr Lovena Le noted his symptoms were  improved and it was unclear whether patient was taking medication.   In August he again complained of dizziness with normal BP. Follow up with Neurology recommended. He was seen in June by Neurology who felt he had BPV and vestibular training recommended.   When seen January 19, 2018 he was doing better. He was still having  some dizziness but the vestibular training seemed to have helped.  He had follow up thyroid studies on Dec 4 and has mild hyperthyroidism. Methimazole dose increased. He now has been diagnosed with Hashimoto's thyroiditis.   Creatinine 1.2. magnesium normal. He is scheduled for a sleep study next month.  Last month he wasn't feeling well. Admitted at [facility] he did not have a heart attack. HR has been controlled. Echo looked OK but showed moderate MR.  Thinks a lot of symptoms may be related to thyroiditis. Considering radioactive iodine ablation in December. He reports difficulty losing weight despite eating a healthy diet and exercising.   The patient does not have symptoms concerning for COVID-19 infection (fever, chills, cough, or  new shortness of breath).    Past Medical History:  Diagnosis Date  . Atrial fibrillation ([place])   . Cataract   . Hypertension   . Seizures ([place])    Past Surgical History:  Procedure Laterality Date  . CATARACT EXTRACTION    . PVC ABLATION N/A 07/14/2017   Procedure: PVC ABLATION;  Surgeon: Evans Lance, MD;  Location: [place] [facility];  Service: Cardiovascular;   Laterality: N/A;  . TONSILLECTOMY    . VARICOSE VEIN SURGERY       No outpatient medications have been marked as taking for the 01/03/19 encounter (Appointment) with [name], Peter M, MD.     Allergies:   Patient has no known allergies.   Social History   Tobacco Use  . Smoking status: Never Smoker  . Smokeless tobacco: Never Used  Substance Use Topics  . Alcohol use: Yes    Alcohol/week: 2.0 standard drinks    Types: 1 Shots of liquor, 1 Cans of beer per week    Comment: occasionally  . Drug use: No     Family Hx: The patient's family history includes CAD in his mother.  ROS:   Please see the history of present illness.    All other systems reviewed and are negative.   Prior CV studies:   The following studies were reviewed today:   Echo 03/29/17: Study Conclusions  - Left ventricle: The cavity size was normal. There was mild concentric hypertrophy. Systolic function was normal. The estimated ejection fraction was in the range of 60% to 65%. Although no diagnostic regional wall motion abnormality was identified, this possibility cannot be completely excluded on the basis of this study. - Aortic valve: Moderate focal calcification involving the right coronary and left coronary cusp. There was very mild stenosis. Valve area (VTI): 1.72 cm^2. Valve area (Vmax): 1.62 cm^2. Valve area (Vmean): 1.43 cm^2. - Left atrium: The atrium was severely dilated. - Right atrium: The atrium was moderately to severely dilated. - Atrial septum: No defect or patent foramen ovale was identified. - Pulmonary arteries: Systolic pressure was mildly increased. PA peak pressure: 35 mm Hg (S).  Echo 12/05/18: Findings Mitral Valve Grossly normal mitral valve with moderate mitral regurgitation. Mild mitral annular calcification. Aortic Valve There is severe aortic sclerosis noted, with no evidence of stenosis. No aortic stenosis. Mild aortic regurgitation. Tricuspid  Valve Tricuspid valve is structurally normal. Mild to moderate tricuspid regurgitation. At least mild pulmonary hypertension. Estimated pulmonary artery pressure is at least 19mmHg. This may be an underestimate. Pulmonic Valve Pulmonic valve is structurally normal. No Doppler evidence of pulmonic stenosis or insufficiency. Left Atrium Normal size left atrium. Left Ventricle Moderate concentric left ventricular hypertrophy.Ejection fraction is visually estimated at [id number] Diastolic function appears normal Left ventricle cavity size is normal. Right Atrium Mild right atrial enlargement Right Ventricle Mildly dilated and hypokinetic right ventricle. Pericardial Effusion No evidence of pericardial effusion. Miscellaneous The aortic root diameter is within normal limits. The IVC is dilated  Labs/Other Tests and Data Reviewed:    EKG:  No ECG reviewed.  Recent Labs: 11/02/2018: ALT 26; BUN 12; Creatinine, Ser 1.00; Hemoglobin 16.4; Platelets 147; Potassium 4.4; Sodium 138   Recent Lipid Panel Lab Results  Component Value Date/Time   CHOL 111 11/04/2012 11:25 AM   TRIG 104 11/04/2012 11:25 AM   HDL 35 (L) 11/04/2012 11:25 AM   CHOLHDL 3.2 11/04/2012 11:25 AM   LDLCALC 55 11/04/2012 11:25 AM    Wt Readings from Last  3 Encounters:  11/02/18 280 lb (127 kg)  07/21/18 286 lb (129.7 kg)  02/08/18 286 lb (129.7 kg)     Objective:    Vital Signs:  There were no vitals taken for this visit.   VITAL SIGNS:  reviewed  ASSESSMENT & PLAN:    1.  Atrial fibrillation persistent.  Rate is controlled on no rate slowing medication. Confirmed by Holter monitors done in May and July 2019. He is not really symptomatic from a rhythm standpoint and symptoms do not correlate with change in BP or HR. [place] Vasc score of 2. Recommend anticoagulation for reduction of stroke risk. Continue Eliquis long term. Do not take ASA or NSAIDs.  2. HTN controlled.  3. History of hyperthyroidism.  Hashimoto's thyroiditis.  Followed by Endocrinology.  On Methimazole. planning to have thyroid ablation.  4. 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 Not a candidate for ablation due to location next to His bundle. On no antiarrhythmic therapy.  5. BPV continue vestibular exercises.  6. Moderate MR. May consider repeat Echo in one year.   COVID-19 Education: The signs and symptoms of COVID-19 were discussed with the patient and how to seek care for testing (follow up with PCP or arrange E-visit).  The importance of social distancing was discussed today.  Time:   Today, I have spent 10 minutes with the patient with telehealth technology discussing the above problems.     Medication Adjustments/Labs and Tests Ordered: Current medicines are reviewed at length with the patient today.  Concerns regarding medicines are outlined above.   Tests Ordered: No orders of the defined types were placed in this encounter.   Medication Changes: No orders of the defined types were placed in this encounter.   Follow Up:  In Person in 1 year(s)  Signed, Peter [name], MD  12/29/2018 2:53 PM    [place]

## 2018-12-30 ENCOUNTER — Other Ambulatory Visit: Payer: Self-pay | Admitting: Cardiology

## 2019-01-02 ENCOUNTER — Ambulatory Visit: Payer: Medicare Other | Admitting: Cardiology

## 2019-01-03 ENCOUNTER — Encounter: Payer: Self-pay | Admitting: Cardiology

## 2019-01-03 ENCOUNTER — Telehealth (INDEPENDENT_AMBULATORY_CARE_PROVIDER_SITE_OTHER): Payer: Medicare Other | Admitting: Cardiology

## 2019-01-03 VITALS — BP 135/70 | HR 81 | Ht 75.0 in | Wt 285.0 lb

## 2019-01-03 DIAGNOSIS — I493 Ventricular premature depolarization: Secondary | ICD-10-CM

## 2019-01-03 DIAGNOSIS — R42 Dizziness and giddiness: Secondary | ICD-10-CM

## 2019-01-03 DIAGNOSIS — I4819 Other persistent atrial fibrillation: Secondary | ICD-10-CM | POA: Diagnosis not present

## 2019-01-03 DIAGNOSIS — I10 Essential (primary) hypertension: Secondary | ICD-10-CM

## 2019-01-03 NOTE — Patient Instructions (Signed)
Medication Instructions:  Continue same medications *If you need a refill on your cardiac medications before your next appointment, please call your pharmacy*  Lab Work: None ordered   Testing/Procedures: None ordered  Follow-Up: At [facility], you and your health needs are our priority.  As part of our continuing mission to provide you with exceptional heart care, we have created designated Provider Care Teams.  These Care Teams include your primary Cardiologist (physician) and Advanced Practice Providers (APPs -  Physician Assistants and Nurse Practitioners) who all work together to provide you with the care you need, when you need it.  Your next appointment:  1 year    ( Call in August to schedule Nov appointment )   The format for your next appointment:  Office   Provider:  Dr.Jordan   

## 2019-01-25 ENCOUNTER — Other Ambulatory Visit: Payer: Self-pay | Admitting: Cardiology

## 2019-01-25 NOTE — Telephone Encounter (Signed)
Eliquis refill request.

## 2019-02-23 ENCOUNTER — Other Ambulatory Visit: Payer: Self-pay | Admitting: Cardiology

## 2019-03-23 ENCOUNTER — Ambulatory Visit: Payer: Medicare Other

## 2019-03-26 ENCOUNTER — Other Ambulatory Visit: Payer: Self-pay | Admitting: Cardiology

## 2019-03-28 ENCOUNTER — Ambulatory Visit: Payer: Medicare Other

## 2019-04-01 ENCOUNTER — Ambulatory Visit: Payer: Medicare Other | Attending: Internal Medicine

## 2019-04-01 DIAGNOSIS — Z23 Encounter for immunization: Secondary | ICD-10-CM | POA: Insufficient documentation

## 2019-04-01 NOTE — Progress Notes (Signed)
   Covid-19 Vaccination Clinic  Name:  Michael Whitney    MRN: [id number] DOB: 1950-06-28  04/01/2019  [name] was observed post Covid-19 immunization for 15 minutes without incidence. He was provided with Vaccine Information Sheet and instruction to access the V-Safe system.   [name] was instructed to call 911 with any severe reactions post vaccine: Marland Kitchen Difficulty breathing  . Swelling of your face and throat  . A fast heartbeat  . A bad rash all over your body  . Dizziness and weakness    Immunizations Administered    Name Date Dose VIS Date Route   Pfizer COVID-19 Vaccine 04/01/2019  8:27 AM 0.3 mL 02/03/2019 Intramuscular   Manufacturer: Junior   Lot: [id number]   [place]: [id number]

## 2019-04-05 ENCOUNTER — Telehealth (INDEPENDENT_AMBULATORY_CARE_PROVIDER_SITE_OTHER): Payer: Medicare Other | Admitting: Physician Assistant

## 2019-04-05 ENCOUNTER — Encounter: Payer: Self-pay | Admitting: Physician Assistant

## 2019-04-05 VITALS — BP 116/61 | HR 67 | Ht 75.0 in | Wt 275.0 lb

## 2019-04-05 DIAGNOSIS — I493 Ventricular premature depolarization: Secondary | ICD-10-CM | POA: Diagnosis not present

## 2019-04-05 DIAGNOSIS — I4819 Other persistent atrial fibrillation: Secondary | ICD-10-CM

## 2019-04-05 DIAGNOSIS — E063 Autoimmune thyroiditis: Secondary | ICD-10-CM | POA: Diagnosis not present

## 2019-04-05 DIAGNOSIS — I10 Essential (primary) hypertension: Secondary | ICD-10-CM | POA: Diagnosis not present

## 2019-04-05 NOTE — Patient Instructions (Signed)
Medication Instructions:  Your physician recommends that you continue on your current medications as directed. Please refer to the Current Medication list given to you today.  *If you need a refill on your cardiac medications before your next appointment, please call your pharmacy*  Lab Work: NONE ordered at this time of appointment   If you have labs (blood work) drawn today and your tests are completely normal, you will receive your results only by: Marland Kitchen MyChart Message (if you have MyChart) OR . A paper copy in the mail If you have any lab test that is abnormal or we need to change your treatment, we will call you to review the results.  Testing/Procedures: NONE ordered at this time of appointment   Follow-Up: At [facility], you and your health needs are our priority.  As part of our continuing mission to provide you with exceptional heart care, we have created designated Provider Care Teams.  These Care Teams include your primary Cardiologist (physician) and Advanced Practice Providers (APPs -  Physician Assistants and Nurse Practitioners) who all work together to provide you with the care you need, when you need it.  Your next appointment:   9 month(s)  The format for your next appointment:   In Person  Provider:   Peter [name], MD  Other Instructions

## 2019-04-05 NOTE — Progress Notes (Signed)
Cardiology Office Note:    Date:  04/07/2019   ID:  Michael Whitney, DOB Jan 09, 1951, MRN [id number]  PCP:  Jonathon Bellows, PA-C  Cardiologist:  Peter [name], MD  Electrophysiologist:  Cristopher Peru, MD   Referring MD: Keane Scrape*   Chief Complaint  Patient presents with  . Follow-up    seen for Dr. [name].    History of Present Illness:    Michael Whitney is a 69 y.o. male with a hx of atrial fibrillation, hypertension, hyperthyroidism/Hashimoto thyroiditis and history of seizure.  Event monitor obtained in 2014 was unremarkable.  He only ever had one seizure episode, he was initially on Keppra, this was later discontinued.  In January 2019, he was diagnosed with atrial fibrillation with controlled heart rate after he presented with lightheadedness, fatigue, dizziness and a 50 pound weight gain.  The duration of atrial fibrillation was unknown.  He was started on anticoagulation therapy.  Dr. [name] pursued a rate control and anticoagulation strategy.  Due to significant recurrent dizzy spell and fatigue, Holter monitor was placed in 2019 that showed atrial fibrillation with slow ventricular rate.  He also had high PVC burden.  He underwent EP study by Dr. Lovena Le which revealed that his PVCs were arising near his His bundle.  Ablation was not recommended due to concern of potential heart block.  He was prescribed mexiletine.  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  Patient was last seen by Dr. [name] in November 2020, at which time he mentioned he was admitted to [facility] in October 2020.  Echocardiogram was okay but showed moderate MR.  He felt majority of his symptoms at the time was related to thyroiditis.  Patient presents today for virtual visit.  He denies any chest pain, lower extremity edema, orthopnea or PND.  He had radioactive  iodine treatment and was placed back on methimazole due to persistent hyperthyroidism afterward.  Since radioactive iodine treatment, he has been noticing left-sided heaviness involving both upper and lower extremity.  He denies any headache, blurred vision or feeling of passing out.  He says this week, the symptom significantly improved after he self discontinued his methimazole.  He is planning to see his endocrinologist back next week and wanted to make sure his current symptom is noncardiac.  His systolic blood pressure has been in the 130s range at home and the heart rate has been above 50 bpm.  There has been no evidence of bradycardia or hypotension recently.  At this time, I do not recommend any further cardiac work-up.  If symptoms persist, he may consider a head CT by his primary care provider.  Suspicion for stroke very low since he has been compliant with Eliquis therapy.  Since the current symptom is new and he has been in atrial fibrillation for the past 2 years, I recommended continue rate control therapy with anticoagulation.  He is self rate controlled with no AV nodal blocking agent.   Past Medical History:  Diagnosis Date  . Atrial fibrillation ([place])   . Cataract   . Hypertension   . Seizures ([place])     Past Surgical History:  Procedure Laterality Date  . CATARACT EXTRACTION    . PVC ABLATION N/A 07/14/2017   Procedure: PVC ABLATION;  Surgeon: Evans Lance, MD;  Location: [place] [facility];  Service: Cardiovascular;  Laterality: N/A;  .  TONSILLECTOMY    . VARICOSE VEIN SURGERY      Current Medications: Current Meds  Medication Sig  . Accu-Chek FastClix Lancets MISC Use 1 lancet daily to test blood sugar (E11.9)  . BIOTIN PO Take 1 tablet by mouth daily.  . Calcium Carb-Cholecalciferol (CALCIUM 600+D3 PO) Take 1 tablet by mouth daily.  . Cholecalciferol (VITAMIN D-3) 25 MCG (1000 UT) CAPS Take by mouth.  Arne Cleveland 5 MG TABS tablet Take 1 tablet by mouth twice daily  .  fluticasone (FLONASE) 50 MCG/ACT nasal spray Place 2 sprays into both nostrils daily as needed for allergies.   Marland Kitchen loratadine (CLARITIN) 10 MG tablet Take 10 mg by mouth daily as needed for allergies.   Marland Kitchen losartan (COZAAR) 25 MG tablet Take 25 mg by mouth daily.  . metFORMIN (GLUCOPHAGE-XR) 500 MG 24 hr tablet Take 500 mg by mouth daily with breakfast.  . methimazole (TAPAZOLE) 5 MG tablet Take 5 mg by mouth daily.  . Multiple Vitamin (MULTIVITAMIN WITH MINERALS) TABS tablet Take 1 tablet by mouth daily.  . Omega-3 Fatty Acids (FISH OIL PO) Take by mouth.     Allergies:   Patient has no known allergies.   Social History   Socioeconomic History  . Marital status: Widowed    Spouse name: Not on file  . Number of children: 2  . Years of education: Not on file  . Highest education level: Not on file  Occupational History  . Occupation: retired    Comment: Geophysicist/field seismologist  Tobacco Use  . Smoking status: Never Smoker  . Smokeless tobacco: Never Used  Substance and Sexual Activity  . Alcohol use: Yes    Alcohol/week: 2.0 standard drinks    Types: 1 Shots of liquor, 1 Cans of beer per week    Comment: occasionally  . Drug use: No  . Sexual activity: Not on file  Other Topics Concern  . Not on file  Social History Narrative  . Not on file   Social Determinants of Health   Financial Resource Strain:   . Difficulty of Paying Living Expenses: Not on file  Food Insecurity:   . Worried About Charity fundraiser in the Last Year: Not on file  . Ran Out of Food in the Last Year: Not on file  Transportation Needs:   . Lack of Transportation (Medical): Not on file  . Lack of Transportation (Non-Medical): Not on file  Physical Activity:   . Days of Exercise per Week: Not on file  . Minutes of Exercise per Session: Not on file  Stress:   . Feeling of Stress : Not on file  Social Connections:   . Frequency of Communication with Friends and Family: Not on file  . Frequency of Social  Gatherings with Friends and Family: Not on file  . Attends Religious Services: Not on file  . Active Member of Clubs or Organizations: Not on file  . Attends Archivist Meetings: Not on file  . Marital Status: Not on file     Family History: The patient's family history includes CAD in his mother.  ROS:   Please see the history of present illness.     All other systems reviewed and are negative.  EKGs/Labs/Other Studies Reviewed:    The following studies were reviewed today:  Echo 03/29/2017 LV EF: 60% -  65%  Study Conclusions   - Left ventricle: The cavity size was normal. There was mild  concentric hypertrophy. Systolic function was  normal. The  estimated ejection fraction was in the range of 60% to 65%.  Although no diagnostic regional wall motion abnormality was  identified, this possibility cannot be completely excluded on the  basis of this study.  - Aortic valve: Moderate focal calcification involving the right  coronary and left coronary cusp. There was very mild stenosis.  Valve area (VTI): 1.72 cm^2. Valve area (Vmax): 1.62 cm^2. Valve  area (Vmean): 1.43 cm^2.  - Left atrium: The atrium was severely dilated.  - Right atrium: The atrium was moderately to severely dilated.  - Atrial septum: No defect or patent foramen ovale was identified.  - Pulmonary arteries: Systolic pressure was mildly increased. PA  peak pressure: 35 mm Hg (S).   EKG:  EKG is not ordered today.   Recent Labs: 11/02/2018: ALT 26; BUN 12; Creatinine, Ser 1.00; Hemoglobin 16.4; Platelets 147; Potassium 4.4; Sodium 138  Recent Lipid Panel    Component Value Date/Time   CHOL 111 11/04/2012 1125   TRIG 104 11/04/2012 1125   HDL 35 (L) 11/04/2012 1125   CHOLHDL 3.2 11/04/2012 1125   VLDL 21 11/04/2012 1125   LDLCALC 55 11/04/2012 1125    Physical Exam:    VS:  BP 116/61   Pulse 67   Ht 6\' 3"  (1.905 m)   Wt 275 lb (124.7 kg)   BMI 34.37 kg/m     Wt  Readings from Last 3 Encounters:  04/05/19 275 lb (124.7 kg)  01/03/19 285 lb (129.3 kg)  11/02/18 280 lb (127 kg)     GEN:  Well nourished, well developed in no acute distress HEENT: Normal NECK: No JVD; No carotid bruits LYMPHATICS: No lymphadenopathy CARDIAC: RRR, no murmurs, rubs, gallops RESPIRATORY:  Clear to auscultation without rales, wheezing or rhonchi  ABDOMEN: Soft, non-tender, non-distended MUSCULOSKELETAL:  No edema; No deformity  SKIN: Warm and dry NEUROLOGIC:  Alert and oriented x 3 PSYCHIATRIC:  Normal affect   ASSESSMENT:    1. Hashimoto's thyroiditis   2. Persistent atrial fibrillation ([place])   3. Essential hypertension   4. PVC (premature ventricular contraction)    PLAN:    In order of problems listed above:  1. Hashimoto's thyroiditis: He presented with a variety of symptoms.  One of his symptom is left-sided weakness.  He has been compliant with his Eliquis, suspicion for major stroke is low.  Symptoms tend to come and go, I suspect this is still related to his thyroiditis.  If the symptoms persist, I would recommend him to see his primary care provider and obtain a head CT.  He denies any headache or blurry vision to suggest brain bleed.   2. Persistent atrial fibrillation: Continue Eliquis.  Self rate controlled  3. History of PVCs: He previously underwent EP study, however PVCs arise from near his His bundle.  Ablation post a very high risk of making him pacemaker dependent  4. Hypertension: Blood pressure well controlled   Medication Adjustments/Labs and Tests Ordered: Current medicines are reviewed at length with the patient today.  Concerns regarding medicines are outlined above.  No orders of the defined types were placed in this encounter.  No orders of the defined types were placed in this encounter.   Patient Instructions  Medication Instructions:  Your physician recommends that you continue on your current medications as directed. Please  refer to the Current Medication list given to you today.  *If you need a refill on your cardiac medications before your next appointment, please call  your pharmacy*  Lab Work: NONE ordered at this time of appointment   If you have labs (blood work) drawn today and your tests are completely normal, you will receive your results only by: Marland Kitchen MyChart Message (if you have MyChart) OR . A paper copy in the mail If you have any lab test that is abnormal or we need to change your treatment, we will call you to review the results.  Testing/Procedures: NONE ordered at this time of appointment   Follow-Up: At [facility], you and your health needs are our priority.  As part of our continuing mission to provide you with exceptional heart care, we have created designated Provider Care Teams.  These Care Teams include your primary Cardiologist (physician) and Advanced Practice Providers (APPs -  Physician Assistants and Nurse Practitioners) who all work together to provide you with the care you need, when you need it.  Your next appointment:   9 month(s)  The format for your next appointment:   In Person  Provider:   Peter [name], MD  Other Instructions      Signed, Almyra Deforest, [place]  04/07/2019 11:35 PM    [place]

## 2019-04-07 ENCOUNTER — Encounter: Payer: Self-pay | Admitting: Physician Assistant

## 2019-04-12 ENCOUNTER — Encounter: Payer: Self-pay | Admitting: Physician Assistant

## 2019-04-12 ENCOUNTER — Telehealth: Payer: Self-pay | Admitting: Cardiology

## 2019-04-12 ENCOUNTER — Ambulatory Visit: Payer: Medicare Other | Admitting: Physician Assistant

## 2019-04-12 ENCOUNTER — Other Ambulatory Visit: Payer: Self-pay

## 2019-04-12 VITALS — BP 148/74 | HR 71 | Temp 98.4°F | Ht 75.0 in | Wt 279.0 lb

## 2019-04-12 DIAGNOSIS — L819 Disorder of pigmentation, unspecified: Secondary | ICD-10-CM

## 2019-04-12 DIAGNOSIS — I10 Essential (primary) hypertension: Secondary | ICD-10-CM

## 2019-04-12 DIAGNOSIS — R079 Chest pain, unspecified: Secondary | ICD-10-CM

## 2019-04-12 DIAGNOSIS — E063 Autoimmune thyroiditis: Secondary | ICD-10-CM

## 2019-04-12 DIAGNOSIS — I4821 Permanent atrial fibrillation: Secondary | ICD-10-CM | POA: Diagnosis not present

## 2019-04-12 NOTE — Telephone Encounter (Signed)
Megan from [facility] is calling requesting a hospital f/u appt for the patient today. The patient was hospitalized due to worsening symptoms that were spoken about at his previous virtual appointment on 04/05/19 with Almyra Deforest. The patient also started experiencing numbness on one side along with worsening dizziness.  She states they sent him home from the hospital stating it was his thyroid without doing a cardiac evaluation and she strongly feels it is something going on with his heart. The patient has been scheduled for an in office appointment today 04/12/19 at 3:15 PM with Almyra Deforest.

## 2019-04-12 NOTE — Progress Notes (Signed)
Cardiology Office Note:    Date:  04/13/2019   ID:  Michael Whitney, DOB 1950/07/22, MRN [id number]  PCP:  Jonathon Bellows, PA-C  Cardiologist:  Peter [name], MD  Electrophysiologist:  Cristopher Peru, MD   Referring MD: Keane Scrape*   Chief Complaint  Patient presents with  . Follow-up    seen for Dr. [name].    History of Present Illness:    Michael Whitney is a 69 y.o. male with a hx of permanent atrial fibrillation, hypertension, hyperthyroidism/Hashimoto thyroiditis and history of seizure.  Event monitor obtained in 2014 was unremarkable.  He only ever had one seizure episode, he was initially on Keppra, this was later discontinued.  In January 2019, he was diagnosed with atrial fibrillation with controlled heart rate after he presented with lightheadedness, fatigue, dizziness and a 50 pound weight gain.  The duration of atrial fibrillation was unknown.  He was started on anticoagulation therapy.  Dr. [name] pursued a rate control and anticoagulation strategy.  Due to significant recurrent dizzy spell and fatigue, Holter monitor was placed in 2019 that showed atrial fibrillation with slow ventricular rate.  He also had high PVC burden.  He underwent EP study by Dr. Lovena Le which revealed that his PVCs were arising near his His bundle.  Ablation was not recommended due to concern of potential heart block.  He was prescribed mexiletine.  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  Patient was last seen by Dr. [name] in November 2020, at which time he mentioned he was admitted to [facility] in October 2020.  Echocardiogram was okay but showed moderate MR.  He felt majority of his symptoms at the time was related to thyroiditis.  I last saw the patient as part of virtual visit on 04/05/2019.  He just had radioactive iodine treatment and was placed on  methimazole due to persistent hyperthyroidism afterward.  Since radioactive iodine treatment, he has been noticing left-sided heaviness involving both upper and lower extremity.  Symptom was improving after he self discontinued his methimazole.  There was no evidence of bradycardia or hypotension, therefore I did not recommend any further work-up.  Since our last virtual visit, he went to the [facility] ED yesterday.  He says prior to arrival, he had a flushing sensation and feel all his energy drained out of him.  Upon EMS arrival, his blood pressure was mildly elevated.  I suspect he had a vasovagal episode.  While in the ED, high-sensitivity troponin was initially 312, this decreased to 269 upon repeat lab work.  Given his recent left-sided tingling sensation, MRI of the brain was performed which was negative for stroke.  The elevated troponin was felt to be related to hyperthyroidism.  He was instructed to follow-up with cardiology service.  During the episode, he denied any obvious chest pain or significant shortness of breath.  We discussed various options, given elevated troponin, I think it is prudent to rule out coronary artery disease.  I will proceed with a Myoview.  On physical exam, he also have dark discolorations of bilateral lower extremity, I am unable to feel a good pulse.  Some of his toes appears to be blue however he does not have significant pain.  I still suspect this is related to venous insufficiency changes, however I did recommend a ABI to make sure he does not have a arterial  blockage.   Past Medical History:  Diagnosis Date  . Atrial fibrillation ([place])   . Cataract   . Hypertension   . Seizures ([place])     Past Surgical History:  Procedure Laterality Date  . CATARACT EXTRACTION    . PVC ABLATION N/A 07/14/2017   Procedure: PVC ABLATION;  Surgeon: Evans Lance, MD;  Location: [facility];  Service: Cardiovascular;  Laterality: N/A;  . TONSILLECTOMY    . VARICOSE  VEIN SURGERY      Current Medications: No outpatient medications have been marked as taking for the 04/12/19 encounter (Office Visit) with Almyra Deforest, PA.     Allergies:   Patient has no known allergies.   Social History   Socioeconomic History  . Marital status: Widowed    Spouse name: Not on file  . Number of children: 2  . Years of education: Not on file  . Highest education level: Not on file  Occupational History  . Occupation: retired    Comment: Geophysicist/field seismologist  Tobacco Use  . Smoking status: Never Smoker  . Smokeless tobacco: Never Used  Substance and Sexual Activity  . Alcohol use: Yes    Alcohol/week: 2.0 standard drinks    Types: 1 Shots of liquor, 1 Cans of beer per week    Comment: occasionally  . Drug use: No  . Sexual activity: Not on file  Other Topics Concern  . Not on file  Social History Narrative  . Not on file   Social Determinants of Health   Financial Resource Strain:   . Difficulty of Paying Living Expenses: Not on file  Food Insecurity:   . Worried About Charity fundraiser in the Last Year: Not on file  . Ran Out of Food in the Last Year: Not on file  Transportation Needs:   . Lack of Transportation (Medical): Not on file  . Lack of Transportation (Non-Medical): Not on file  Physical Activity:   . Days of Exercise per Week: Not on file  . Minutes of Exercise per Session: Not on file  Stress:   . Feeling of Stress : Not on file  Social Connections:   . Frequency of Communication with Friends and Family: Not on file  . Frequency of Social Gatherings with Friends and Family: Not on file  . Attends Religious Services: Not on file  . Active Member of Clubs or Organizations: Not on file  . Attends Archivist Meetings: Not on file  . Marital Status: Not on file     Family History: The patient's family history includes CAD in his mother.  ROS:   Please see the history of present illness.     All other systems reviewed and are  negative.  EKGs/Labs/Other Studies Reviewed:    The following studies were reviewed today:  Echo 03/29/2017 LV EF: 60% -  65%   -------------------------------------------------------------------  Indications:   I48.1 Atrial fibrillation. I10 Hypertension. R42  Dizziness.   -------------------------------------------------------------------  History:  PMH: Seizure disorder.   -------------------------------------------------------------------  Study Conclusions   - Left ventricle: The cavity size was normal. There was mild  concentric hypertrophy. Systolic function was normal. The  estimated ejection fraction was in the range of 60% to 65%.  Although no diagnostic regional wall motion abnormality was  identified, this possibility cannot be completely excluded on the  basis of this study.  - Aortic valve: Moderate focal calcification involving the right  coronary and left coronary cusp. There was very  mild stenosis.  Valve area (VTI): 1.72 cm^2. Valve area (Vmax): 1.62 cm^2. Valve  area (Vmean): 1.43 cm^2.  - Left atrium: The atrium was severely dilated.  - Right atrium: The atrium was moderately to severely dilated.  - Atrial septum: No defect or patent foramen ovale was identified.  - Pulmonary arteries: Systolic pressure was mildly increased. PA  peak pressure: 35 mm Hg (S).   EKG:  EKG is not ordered today.    Recent Labs: 11/02/2018: ALT 26; BUN 12; Creatinine, Ser 1.00; Hemoglobin 16.4; Platelets 147; Potassium 4.4; Sodium 138  Recent Lipid Panel    Component Value Date/Time   CHOL 111 11/04/2012 1125   TRIG 104 11/04/2012 1125   HDL 35 (L) 11/04/2012 1125   CHOLHDL 3.2 11/04/2012 1125   VLDL 21 11/04/2012 1125   LDLCALC 55 11/04/2012 1125    Physical Exam:    VS:  BP (!) 148/74   Pulse 71   Temp 98.4 F (36.9 C)   Ht 6\' 3"  (1.905 m)   Wt 279 lb (126.6 kg)   SpO2 97%   BMI 34.87 kg/m     Wt Readings from Last 3 Encounters:    04/12/19 279 lb (126.6 kg)  04/05/19 275 lb (124.7 kg)  01/03/19 285 lb (129.3 kg)     GEN:  Well nourished, well developed in no acute distress HEENT: Normal NECK: No JVD; No carotid bruits LYMPHATICS: No lymphadenopathy CARDIAC: RRR, no murmurs, rubs, gallops RESPIRATORY:  Clear to auscultation without rales, wheezing or rhonchi  ABDOMEN: Soft, non-tender, non-distended MUSCULOSKELETAL:  No edema; No deformity  SKIN: Warm and dry NEUROLOGIC:  Alert and oriented x 3 PSYCHIATRIC:  Normal affect   ASSESSMENT:    1. Chest pain, unspecified type   2. Discoloration of skin of lower leg   3. Permanent atrial fibrillation ([place])   4. Essential hypertension   5. Hashimoto's thyroiditis    PLAN:    In order of problems listed above:  1. Elevated troponin: He went to [facility] ED yesterday and found to have elevated troponin, troponin level stayed flat on repeat check and is inconsistent with ACS.  He was subsequently released from the hospital to follow-up with cardiology service.  I am unable to explain the elevation of the troponin and cannot contribute it directly to thyroid issue.  I discussed the case with DOD Dr. Ellyn Hack, at this time we recommend proceeding with a Lexiscan Myoview.  2. Discoloration of lower extremity, I still suspect his lower extremities discoloration is more related to venous stasis and varicose veins.  I did not feel very good dorsalis pedis pulse or posterior tibial pulse, I will obtain an ABI to rule out blood flow issue  3. Permanent atrial fibrillation: On Eliquis.  Self rate controlled on no AV nodal blocking agent  4. Hypertension: Blood pressure mildly elevated, continue on current therapy.  I did not try to increase the losartan due to fear of worsening his occasional dizzy spell  5. Hashimoto thyroiditis: On methimazole.    Medication Adjustments/Labs and Tests Ordered: Current medicines are reviewed at length with the patient today.  Concerns  regarding medicines are outlined above.  Orders Placed This Encounter  Procedures  . MYOCARDIAL PERFUSION IMAGING  . VAS [place] ABI WITH/WO TBI   No orders of the defined types were placed in this encounter.   Patient Instructions  Medication Instructions:  Your physician recommends that you continue on your current medications as directed. Please refer to  the Current Medication list given to you today.  *If you need a refill on your cardiac medications before your next appointment, please call your pharmacy*  Lab Work: NONE ordered at this time of appointment   If you have labs (blood work) drawn today and your tests are completely normal, you will receive your results only by: Marland Kitchen MyChart Message (if you have MyChart) OR . A paper copy in the mail If you have any lab test that is abnormal or we need to change your treatment, we will call you to review the results.  Testing/Procedures: Your physician has requested that you have an ankle brachial index (ABI). During this test an ultrasound and blood pressure cuff are used to evaluate the arteries that supply the arms and legs with blood. Allow thirty minutes for this exam. There are no restrictions or special instructions.   Please schedule for 2 weeks  Your physician has requested that you have a lexiscan myoview. For further information please visit [email or web address]. Please follow instruction sheet, as given.   Please schedule for 1 week  Follow-Up: At [facility], you and your health needs are our priority.  As part of our continuing mission to provide you with exceptional heart care, we have created designated Provider Care Teams.  These Care Teams include your primary Cardiologist (physician) and Advanced Practice Providers (APPs -  Physician Assistants and Nurse Practitioners) who all work together to provide you with the care you need, when you need it.  Your next appointment:   4 week(s)  The format for your next  appointment:   In Person  Provider:   Peter [name], MD  Other Instructions      Signed, Almyra Deforest, [place]  04/13/2019 9:45 AM    [place]

## 2019-04-12 NOTE — Patient Instructions (Signed)
Medication Instructions:  Your physician recommends that you continue on your current medications as directed. Please refer to the Current Medication list given to you today.  *If you need a refill on your cardiac medications before your next appointment, please call your pharmacy*  Lab Work: NONE ordered at this time of appointment   If you have labs (blood work) drawn today and your tests are completely normal, you will receive your results only by: Marland Kitchen MyChart Message (if you have MyChart) OR . A paper copy in the mail If you have any lab test that is abnormal or we need to change your treatment, we will call you to review the results.  Testing/Procedures: Your physician has requested that you have an ankle brachial index (ABI). During this test an ultrasound and blood pressure cuff are used to evaluate the arteries that supply the arms and legs with blood. Allow thirty minutes for this exam. There are no restrictions or special instructions.   Please schedule for 2 weeks  Your physician has requested that you have a lexiscan myoview. For further information please visit [email or web address]. Please follow instruction sheet, as given.   Please schedule for 1 week  Follow-Up: At [facility], you and your health needs are our priority.  As part of our continuing mission to provide you with exceptional heart care, we have created designated Provider Care Teams.  These Care Teams include your primary Cardiologist (physician) and Advanced Practice Providers (APPs -  Physician Assistants and Nurse Practitioners) who all work together to provide you with the care you need, when you need it.  Your next appointment:   4 week(s)  The format for your next appointment:   In Person  Provider:   Peter [name], MD  Other Instructions

## 2019-04-12 NOTE — Telephone Encounter (Signed)
The patient had an appointment today with Almyra Deforest, PA

## 2019-04-13 ENCOUNTER — Encounter: Payer: Self-pay | Admitting: Physician Assistant

## 2019-04-13 ENCOUNTER — Telehealth: Payer: Self-pay | Admitting: Physician Assistant

## 2019-04-13 NOTE — Telephone Encounter (Signed)
I have personally called and spoke with the patient.  He has left facial numbness and left extremity numbness.  He feels very dizzy when he turns his head side to side.  Overall he is feeling very poorly this morning.  He is pending Myoview for elevated troponin however denies any chest pain or shortness of breath.  I suspect the elevation of the troponin is more demand ischemia, there is no obvious symptoms to suggest ACS.  His symptom remains very atypical.  MRI of the brain obtained at [facility] ED 2 days ago was negative for acute event.  He is still struggling with Hashimoto's thyroiditis and hyperthyroidism.  I am not entirely sure if his current symptom is related to hyperthyroidism.  I recommend him to seek medical attention at local ED again if the symptom does not improve.    I am not entirely sure if his symptom is related to neurological issue versus paroxysmal positional vertigo.  Although paroxysmal positional vertigo cannot explain why he is having dizzy spell when he leans from side to side, however it cannot explain why he is having left extremity weakness.  Spinal stenosis may explain the extremity weakness, however does not explain why he is having left-sided facial numbness.  He has many symptoms which cannot fit in a single diagnosis at this time.  Carotid artery disease may cause dizziness, however also does not explain why he is only having left-sided body weakness.

## 2019-04-13 NOTE — Telephone Encounter (Signed)
Called patient to schedule some appts and he asked that I let Michael Whitney know the left side of his face is very numb and he is extremely dizzy, to the point of having difficulty walking.,  Please call patient to discuss.

## 2019-04-14 ENCOUNTER — Other Ambulatory Visit (HOSPITAL_COMMUNITY): Payer: Self-pay | Admitting: Physician Assistant

## 2019-04-14 ENCOUNTER — Telehealth: Payer: Self-pay | Admitting: Physician Assistant

## 2019-04-14 DIAGNOSIS — M5410 Radiculopathy, site unspecified: Secondary | ICD-10-CM

## 2019-04-14 NOTE — Telephone Encounter (Signed)
Returned call to patient who saw [place] PA on 2/17 and talked to him on the phone on 2/18. He reports continued lightheadedness, numbness in feet/face. He did not go to ED as directed by PA yesterday. He asked if [place], [place] could order testing to be done in the hospital if he went, and I explained this is not the typically the process. He asked if his symptoms could be due to taking eliquis, losartan, tapazole, metformin all at the same/similar times. Advised he could try to space them out a bit, as long as they are in the window in which they need to be taken.   Will notify PA as [facility]

## 2019-04-14 NOTE — Telephone Encounter (Signed)
New message   STAT if patient feels like he/she is going to faint   1) Are you dizzy now? Yes   2) Do you feel faint or have you passed out? No   3) Do you have any other symptoms? Numbness in leg and face   4) Have you checked your HR and BP (record if available)? Yes, Patient states that his b/p is normal

## 2019-04-17 ENCOUNTER — Ambulatory Visit (HOSPITAL_COMMUNITY)
Admission: RE | Admit: 2019-04-17 | Discharge: 2019-04-17 | Disposition: A | Discharge status: Home / Self Care | Payer: Medicare Other | Source: Ambulatory Visit | Attending: Internal Medicine | Admitting: Internal Medicine

## 2019-04-17 ENCOUNTER — Other Ambulatory Visit: Payer: Self-pay

## 2019-04-17 DIAGNOSIS — R0989 Other specified symptoms and signs involving the circulatory and respiratory systems: Secondary | ICD-10-CM | POA: Diagnosis not present

## 2019-04-17 DIAGNOSIS — M5410 Radiculopathy, site unspecified: Secondary | ICD-10-CM | POA: Diagnosis present

## 2019-04-17 DIAGNOSIS — L819 Disorder of pigmentation, unspecified: Secondary | ICD-10-CM | POA: Diagnosis not present

## 2019-04-21 NOTE — Telephone Encounter (Signed)
I suspect some of his dizziness and numbness in the face and extremity may not be cardiac in nature, that's why I initially recommended him to go to the hospital as I cannot order neurological exams

## 2019-04-21 NOTE — Progress Notes (Signed)
Bilateral ABI is normal showing very good blood flow. Lower leg discoloration is likely related to venous stasis. No treatment needed

## 2019-04-25 ENCOUNTER — Telehealth (HOSPITAL_COMMUNITY): Payer: Self-pay

## 2019-04-25 NOTE — Telephone Encounter (Signed)
Encounter complete. 

## 2019-04-26 ENCOUNTER — Ambulatory Visit: Payer: Medicare Other | Attending: Internal Medicine

## 2019-04-26 DIAGNOSIS — Z23 Encounter for immunization: Secondary | ICD-10-CM | POA: Insufficient documentation

## 2019-04-26 NOTE — Progress Notes (Signed)
   Covid-19 Vaccination Clinic  Name:  Michael Whitney    MRN: [id number] DOB: 07/22/1950  04/26/2019  Michael Whitney was observed post Covid-19 immunization for 15 minutes without incident. He was provided with Vaccine Information Sheet and instruction to access the V-Safe system.   Michael Whitney was instructed to call 911 with any severe reactions post vaccine: Marland Kitchen Difficulty breathing  . Swelling of face and throat  . A fast heartbeat  . A bad rash all over body  . Dizziness and weakness   Immunizations Administered    Name Date Dose VIS Date Route   Pfizer COVID-19 Vaccine 04/26/2019 10:25 AM 0.3 mL 02/03/2019 Intramuscular   Manufacturer: [place]   Lot: [id number]   [place]: [id number]

## 2019-04-27 ENCOUNTER — Other Ambulatory Visit: Payer: Self-pay

## 2019-04-27 ENCOUNTER — Ambulatory Visit (HOSPITAL_COMMUNITY)
Admission: RE | Admit: 2019-04-27 | Discharge: 2019-04-27 | Disposition: A | Discharge status: Home / Self Care | Payer: Medicare Other | Source: Ambulatory Visit | Attending: Cardiology | Admitting: Cardiology

## 2019-04-27 DIAGNOSIS — R079 Chest pain, unspecified: Secondary | ICD-10-CM | POA: Insufficient documentation

## 2019-04-27 LAB — MYOCARDIAL PERFUSION IMAGING
Peak HR: 85 {beats}/min
Rest HR: 73 {beats}/min
SRS: 1
SSS: 1
TID: 1.19

## 2019-04-27 MED ORDER — TECHNETIUM TC 99M TETROFOSMIN IV KIT
9.3000 | PACK | Freq: Once | INTRAVENOUS | Status: AC | PRN
  Administered 2019-04-27: 9.3 via INTRAVENOUS
  Filled 2019-04-27: qty 10

## 2019-04-27 MED ORDER — TECHNETIUM TC 99M TETROFOSMIN IV KIT
30.7000 | PACK | Freq: Once | INTRAVENOUS | Status: AC | PRN
  Administered 2019-04-27: 30.7 via INTRAVENOUS
  Filled 2019-04-27: qty 31

## 2019-04-27 MED ORDER — REGADENOSON 0.4 MG/5ML IV SOLN
0.4000 mg | Freq: Once | INTRAVENOUS | Status: AC
  Administered 2019-04-27: 0.4 mg via INTRAVENOUS

## 2019-04-28 ENCOUNTER — Other Ambulatory Visit: Payer: Self-pay | Admitting: Cardiology

## 2019-04-28 NOTE — Telephone Encounter (Signed)
68 M 136.6 kg, SCR 1.0 (9/20), LOV Almyra Deforest 2/21  ([name])

## 2019-05-01 ENCOUNTER — Other Ambulatory Visit: Payer: Self-pay

## 2019-05-01 ENCOUNTER — Encounter: Payer: Self-pay | Admitting: Family Medicine

## 2019-05-01 ENCOUNTER — Ambulatory Visit (INDEPENDENT_AMBULATORY_CARE_PROVIDER_SITE_OTHER): Payer: Medicare Other | Admitting: Family Medicine

## 2019-05-01 VITALS — BP 148/70 | HR 77 | Temp 97.4°F | Ht 75.0 in | Wt 277.3 lb

## 2019-05-01 DIAGNOSIS — H8109 Meniere's disease, unspecified ear: Secondary | ICD-10-CM

## 2019-05-01 DIAGNOSIS — H8309 Labyrinthitis, unspecified ear: Secondary | ICD-10-CM

## 2019-05-01 NOTE — Patient Instructions (Addendum)
Labyrinthitis  Labyrinthitis is an inner ear infection. The inner ear is a system of tubes and canals (labyrinth) that are filled with fluid. The inner ear also contains nerve cells that send hearing and balance signals to the brain. When tiny germs (microorganisms) get inside the labyrinth, they harm the cells that send messages to the brain. This can cause changes in hearing and balance. Labyrinthitis usually develops suddenly and goes away with treatment in a few weeks (acute labyrinthitis). If the infection damages parts of the labyrinth, some symptoms may last for a long time (chronic labyrinthitis). What are the causes? Labyrinthitis is most often caused by a virus, such as one that causes:  Infectious mononucleosis, also called "mono."  Measles.  The flu.  Herpes. Labyrinthitis can also be caused by bacteria that spread from an infection in the brain or the middle ear (suppurative labyrinthitis). In some cases, the bacteria may produce a poison (toxin) that gets inside the labyrinth (serous labyrinthitis). What increases the risk? You may be at greater risk for labyrinthitis if you:  Recently had a mouth, nose, or throat infection (upper respiratory infection) or an ear infection.  Drink a lot of alcohol.  Smoke.  Use certain drugs.  Are not well-rested (are fatigued).  Are experiencing a lot of stress.  Have allergies. What are the signs or symptoms? Symptoms of labyrinthitis usually start suddenly. The symptoms may range from mild to severe, and may include:  Dizziness.  Hearing loss.  A feeling that you or your surroundings are moving when they are not (vertigo).  Ringing in your ear (tinnitus).  Nausea and vomiting.  Trouble focusing your eyes. Symptoms of chronic labyrinthitis may include:  Fatigue.  Confusion.  Hearing loss.  Tinnitus.  Poor balance.  Vertigo after sudden head movements. How is this diagnosed? This condition may be diagnosed  based on:  Your symptoms and medical history. Your health care provider may ask about any dizziness or hearing loss you have and any recent upper respiratory infections.  A physical exam that involves: ? Checking your ears for infection. ? Testing your balance. ? Checking your eye movement.  Hearing tests.  Imaging tests, such as CT scan or MRI.  Tests of your eye movements (electronystagmogram, ENG). How is this treated?  Treatment depends on the cause. If your condition is caused by bacteria, you may need antibiotic medicine. If it is caused by a virus, it may get better on its own. Regardless of the cause, you may be treated with:  Medicines to: ? Stop dizziness. ? Relieve nausea. ? Reduce inflammation. ? Speed up your recovery.  Rest. You may be asked to limit your activities until dizziness goes away.  IV fluids. These may be given at a hospital. You may need IV fluids if you have severe nausea and vomiting.  Physical therapy. A therapist can teach you exercises to help you adjust to feeling dizzy (vestibular rehabilitation exercises). You may need this if you have dizziness that does not go away. Follow these instructions at home: Medicines  Take over-the-counter and prescription medicines only as told by your health care provider.  If you were prescribed an antibiotic medicine, take it exactly as told by your health care provider. Do not stop taking the antibiotic even if you start to feel better. Activity  Rest as much as possible.  Limit your activity as directed. Ask your health care provider what activities are safe for you.  Do not make sudden movements until any dizziness  goes away.  If physical therapy was prescribed, do exercises as directed. General instructions  Avoid loud noises and bright lights.  Do not drive until your health care provider says that this is safe for you.  Drink enough fluid to keep your urine pale yellow.  Keep all follow-up  visits as told by your health care provider. This is important. Contact a health care provider if you have:  Symptoms that do not get better with medicine.  Symptoms that last longer than two weeks.  A fever. Get help right away if you have:  Nausea or vomiting that is severe or does not go away.  Severe dizziness.  Sudden hearing loss. Summary  Labyrinthitis is an infection of the inner ear. It can cause changes in hearing and balance.  Symptoms usually start suddenly and include dizziness, hearing loss, and nausea and vomiting. You may also have ringing in your ear (tinnitus), trouble focusing your eyes, and a feeling that you or your surroundings are moving when they are not (vertigo).  If the condition lasts more than a few weeks, symptoms may include fatigue, confusion, hearing loss, poor balance, tinnitus, and vertigo.  Treatment depends on the cause. If your labyrinthitis is caused by bacteria, you may need antibiotic medicine. If your labyrinthitis is caused by a virus, it may get better on its own.  Follow your health care provider's instructions, including how to take medicines, what activities to avoid, and when to get medical help. This information is not intended to replace advice given to you by your health care provider. Make sure you discuss any questions you have with your health care provider. Document Revised: 11/29/2017 Document Reviewed: 02/20/2017 Elsevier Patient Education  [place]. How to Perform the Epley Maneuver The Epley maneuver is an exercise that relieves symptoms of vertigo. Vertigo is the feeling that you or your surroundings are moving when they are not. When you feel vertigo, you may feel like the room is spinning and have trouble walking. Dizziness is a little different than vertigo. When you are dizzy, you may feel unsteady or light-headed. You can do this maneuver at home whenever you have symptoms of vertigo. You can do it up to 3 times  a day until your symptoms go away. Even though the Epley maneuver may relieve your vertigo for a few weeks, it is possible that your symptoms will return. This maneuver relieves vertigo, but it does not relieve dizziness. What are the risks? If it is done correctly, the Epley maneuver is considered safe. Sometimes it can lead to dizziness or nausea that goes away after a short time. If you develop other symptoms, such as changes in vision, weakness, or numbness, stop doing the maneuver and call your health care provider. How to perform the Epley maneuver 1. Sit on the edge of a bed or table with your back straight and your legs extended or hanging over the edge of the bed or table. 2. Turn your head halfway toward the affected ear or side. 3. Lie backward quickly with your head turned until you are lying flat on your back. You may want to position a pillow under your shoulders. 4. Hold this position for 30 seconds. You may experience an attack of vertigo. This is normal. 5. Turn your head to the opposite direction until your unaffected ear is facing the floor. 6. Hold this position for 30 seconds. You may experience an attack of vertigo. This is normal. Hold this position until the  vertigo stops. 7. Turn your whole body to the same side as your head. Hold for another 30 seconds. 8. Sit back up. You can repeat this exercise up to 3 times a day. Follow these instructions at home:  After doing the Epley maneuver, you can return to your normal activities.  Ask your health care provider if there is anything you should do at home to prevent vertigo. He or she may recommend that you: ? Keep your head raised (elevated) with two or more pillows while you sleep. ? Do not sleep on the side of your affected ear. ? Get up slowly from bed. ? Avoid sudden movements during the day. ? Avoid extreme head movement, like looking up or bending over. Contact a health care provider if:  Your vertigo gets  worse.  You have other symptoms, including: ? Nausea. ? Vomiting. ? Headache. Get help right away if:  You have vision changes.  You have a severe or worsening headache or neck pain.  You cannot stop vomiting.  You have new numbness or weakness in any part of your body. Summary  Vertigo is the feeling that you or your surroundings are moving when they are not.  The Epley maneuver is an exercise that relieves symptoms of vertigo.  If the Epley maneuver is done correctly, it is considered safe. You can do it up to 3 times a day. This information is not intended to replace advice given to you by your health care provider. Make sure you discuss any questions you have with your health care provider. Document Revised: 01/22/2017 Document Reviewed: 12/31/2015 Elsevier Patient Education  [place].  Meniere Disease  Meniere disease is an inner ear disorder. It causes attacks of a spinning sensation (vertigo), dizziness, and ringing in the ear (tinnitus). It also causes hearing loss and a feeling of fullness or pressure in the ear. This is a lifelong condition, and it may get worse over time. You may have drop attacks or severe dizziness that makes you fall. A drop attack is when you suddenly fall without losing consciousness and you quickly recover after a few seconds or minutes. What are the causes? This condition is caused by having too much of the fluid that is in your inner ear (endolymph). When fluid builds up in your inner ear, it affects the nerves that control balance and hearing. The reason for the fluid buildup is not known. Possible causes include:  Allergies.  An abnormal reaction of the body's defense system (autoimmune disease).  Viral infection of the inner ear.  Head injury. What increases the risk? You are more likely to develop this condition if:  You are older than age 73.  You have a family history of Meniere disease.  You have a history of  autoimmune disease.  You have a history of migraine headaches. What are the signs or symptoms? Symptoms of this condition can come and go and may last for up to 4 hours at a time. Symptoms usually start in one ear. They may become more frequent and eventually involve both ears. Symptoms can include:  Fullness and pressure in your ear.  Roaring or ringing in your ear.  Vertigo and loss of balance.  Dizziness.  Decreased hearing.  Nausea and vomiting. How is this diagnosed? This condition is diagnosed based on:  A physical exam.  Tests , such as: ? A hearing test (audiogram). ? An electronystagmogram. This tests your balance nerve (vestibular nerve). ? Imaging studies of your  inner ear, such as CT scan or MRI. ? Other balance tests, such as rotational or balance platform tests. How is this treated? There is no cure for this condition, but treatment can help to manage your symptoms. Treatment may include:  A low-salt diet. Limiting salt may help to reduce fluid in the body and relieve symptoms.  Oral or injected medicines to reduce or control: ? Vertigo. ? Nausea. ? Fluid retention. ? Dizziness.  Use of an air pressure pulse generator. This is a machine that sends small pressure pulses into your ear canal.  Hearing aids.  Inner ear surgery. This is rare. When you have symptoms, it can be helpful to lie down on a flat surface and focus your eyes on one object that does not move. Try to stay in that position until your symptoms go away. Follow these instructions at home: Eating and drinking  Eat the same amount of food at the same time every day, including snacks.  Do not skip meals.  Avoid caffeine.  Drink enough fluids to keep your urine clear or pale yellow.  Limit alcoholic drinks to one drink a day for non-pregnant women and 2 drinks a day for men. One drink equals 12 oz of beer, 5 oz of wine, or 1 oz of hard liquor.  Limit the salt (sodium) in your diet as  told by your health care provider. Check ingredients and nutrition facts on packaged foods and beverages.  Do not eat foods that contain monosodium glutamate (MSG). General instructions  Do not use any products that contain nicotine or tobacco, such as cigarettes and e-cigarettes. If you need help quitting, ask your health care provider.  Take over-the-counter and prescription medicines only as told by your health care provider.  Find ways to reduce or avoid stress. If you need help with this, ask your health care provider.  Do not drive if you have vertigo or dizziness. Contact a health care provider if:  You have symptoms that last longer than 4 hours.  You have new or worse symptoms. Get help right away if:  You have been vomiting for 24 hours.  You cannot keep fluids down.  You have chest pain or trouble breathing. Summary  Meniere disease is an inner ear disorder. It causes attacks of a spinning sensation (vertigo), dizziness, and ringing in the ear (tinnitus). It also causes hearing loss and a feeling of fullness or pressure in the ear.  Symptoms of this condition can come and go and may last for up to 4 hours at a time.  When you have symptoms, it can be helpful to lie down on a flat surface and focus your eyes on one object that does not move. Try to stay in that position until your symptoms go away. This information is not intended to replace advice given to you by your health care provider. Make sure you discuss any questions you have with your health care provider. Document Revised: 01/22/2017 Document Reviewed: 01/01/2016 Elsevier Patient Education  2020 [employer].

## 2019-05-01 NOTE — Progress Notes (Signed)
Established Patient Office Visit  Subjective:  Patient ID: Michael Whitney, male    DOB: June 10, 1950  Age: 69 y.o. MRN: [id number]  CC:  Chief Complaint  Patient presents with  . Establish Care    new patient, c/o dizzy spells and weight gain thyroid issues    HPI SHAWNELL GADALETA presents for evaluation of ongoing apparent in her ear disorder.  This is been present for over a year now on patient is frustrated.  He describes a feeling of dizziness as movement that is not occurring.  At times he feels as though he has just stepped off a merry-go-round.  He denies lightheadedness or palpitations.  There is some ringing in his ears.  There is mild hearing loss.  Recently diagnosed with Hashimoto's thyroiditis and hyperthyroidism.  Status post recent radiation ablation.  Currently taking Tapazole.  Developed associated atrial fibrillation.  Currently taking Eliquis.  Blood pressure is usually under better control when he is not seeing the doctor.  Essentially normal MRI in September of this past year.  Past Medical History:  Diagnosis Date  . Atrial fibrillation ([place])   . Cataract   . Hypertension   . Seizures ([place])     Past Surgical History:  Procedure Laterality Date  . CATARACT EXTRACTION    . PVC ABLATION N/A 07/14/2017   Procedure: PVC ABLATION;  Surgeon: Evans Lance, MD;  Location: [facility];  Service: Cardiovascular;  Laterality: N/A;  . TONSILLECTOMY    . VARICOSE VEIN SURGERY      Family History  Problem Relation Age of Onset  . CAD Mother        MI at age 18    Social History   Socioeconomic History  . Marital status: Widowed    Spouse name: Not on file  . Number of children: 2  . Years of education: Not on file  . Highest education level: Not on file  Occupational History  . Occupation: retired    Comment: Geophysicist/field seismologist  Tobacco Use  . Smoking status: Never Smoker  . Smokeless tobacco: Never Used  Substance and Sexual Activity  . Alcohol use: Yes   Alcohol/week: 2.0 standard drinks    Types: 1 Shots of liquor, 1 Cans of beer per week    Comment: occasionally  . Drug use: No  . Sexual activity: Not on file  Other Topics Concern  . Not on file  Social History Narrative  . Not on file   Social Determinants of Health   Financial Resource Strain:   . Difficulty of Paying Living Expenses: Not on file  Food Insecurity:   . Worried About Charity fundraiser in the Last Year: Not on file  . Ran Out of Food in the Last Year: Not on file  Transportation Needs:   . Lack of Transportation (Medical): Not on file  . Lack of Transportation (Non-Medical): Not on file  Physical Activity:   . Days of Exercise per Week: Not on file  . Minutes of Exercise per Session: Not on file  Stress:   . Feeling of Stress : Not on file  Social Connections:   . Frequency of Communication with Friends and Family: Not on file  . Frequency of Social Gatherings with Friends and Family: Not on file  . Attends Religious Services: Not on file  . Active Member of Clubs or Organizations: Not on file  . Attends Archivist Meetings: Not on file  . Marital Status:  Not on file  Intimate Partner Violence:   . Fear of Current or Ex-Partner: Not on file  . Emotionally Abused: Not on file  . Physically Abused: Not on file  . Sexually Abused: Not on file    Outpatient Medications Prior to Visit  Medication Sig Dispense Refill  . Accu-Chek FastClix Lancets MISC Use 1 lancet daily to test blood sugar (E11.9)    . ascorbic acid (VITAMIN C) 1000 MG tablet Take by mouth.    Marland Kitchen BIOTIN PO Take 1 tablet by mouth daily.    . Calcium Carb-Cholecalciferol (CALCIUM 600+D3 PO) Take 1 tablet by mouth daily.    . Cholecalciferol (VITAMIN D-3) 25 MCG (1000 UT) CAPS Take by mouth.    Arne Cleveland 5 MG TABS tablet Take 1 tablet by mouth twice daily 180 tablet 1  . losartan (COZAAR) 25 MG tablet Take 25 mg by mouth daily.    . metFORMIN (GLUCOPHAGE-XR) 500 MG 24 hr tablet Take  500 mg by mouth daily with breakfast.    . Multiple Vitamin (MULTI-VITAMIN) tablet Take by mouth.    . Multiple Vitamin (MULTIVITAMIN WITH MINERALS) TABS tablet Take 1 tablet by mouth daily.    . Omega-3 Fatty Acids (FISH OIL PO) Take by mouth.    . methimazole (TAPAZOLE) 5 MG tablet Take 5 mg by mouth daily.  2   No facility-administered medications prior to visit.    No Known Allergies  ROS Review of Systems  Constitutional: Negative.   HENT: Negative for ear discharge, ear pain, postnasal drip and rhinorrhea.   Eyes: Negative for photophobia and visual disturbance.  Respiratory: Negative.   Cardiovascular: Positive for palpitations. Negative for chest pain.  Gastrointestinal: Negative for nausea and vomiting.  Genitourinary: Negative.   Musculoskeletal: Positive for gait problem.  Neurological: Positive for dizziness. Negative for tremors, speech difficulty, light-headedness, numbness and headaches.  Psychiatric/Behavioral: Negative.       Objective:    Physical Exam  Constitutional: He is oriented to person, place, and time. He appears well-developed and well-nourished. No distress.  HENT:  Head: Normocephalic and atraumatic.  Right Ear: External ear normal.  Left Ear: External ear normal.  Nose: Nose normal.  Mouth/Throat: Oropharynx is clear and moist. No oropharyngeal exudate.  Eyes: Pupils are equal, round, and reactive to light. Conjunctivae are normal. Right eye exhibits no discharge. Left eye exhibits no discharge.  Neck: No JVD present. No tracheal deviation present. No thyromegaly present.  Cardiovascular: An irregularly irregular rhythm present.  Pulmonary/Chest: Effort normal and breath sounds normal. No stridor.  Lymphadenopathy:    He has no cervical adenopathy.  Neurological: He is alert and oriented to person, place, and time.  Skin: Skin is warm and dry. He is not diaphoretic.  Psychiatric: He has a normal mood and affect. His behavior is normal.     BP (!) 148/70   Pulse 77   Temp (!) 97.4 F (36.3 C) (Tympanic)   Ht 6\' 3"  (1.905 m)   Wt 277 lb 4.8 oz (125.8 kg)   SpO2 96%   BMI 34.66 kg/m  Wt Readings from Last 3 Encounters:  05/01/19 277 lb 4.8 oz (125.8 kg)  04/27/19 279 lb (126.6 kg)  04/12/19 279 lb (126.6 kg)     Health Maintenance Due  Topic Date Due  . Hepatitis C Screening  [birth year]  . FOOT EXAM  07/22/1958  . OPHTHALMOLOGY EXAM  07/22/1958  . TETANUS/TDAP  07/22/1967  . COLONOSCOPY  07/22/1998  . HEMOGLOBIN A1C  05/04/2013    There are no preventive care reminders to display for this patient.  No results found for: TSH Lab Results  Component Value Date   WBC 5.4 11/02/2018   HGB 16.4 11/02/2018   HCT 48.8 11/02/2018   MCV 97.0 11/02/2018   PLT 147 (L) 11/02/2018   Lab Results  Component Value Date   NA 138 11/02/2018   K 4.4 11/02/2018   CO2 21 (L) 11/02/2018   GLUCOSE 111 (H) 11/02/2018   BUN 12 11/02/2018   CREATININE 1.00 11/02/2018   BILITOT 0.7 11/02/2018   ALKPHOS 44 11/02/2018   AST 25 11/02/2018   ALT 26 11/02/2018   PROT 6.2 (L) 11/02/2018   ALBUMIN 3.8 11/02/2018   CALCIUM 8.8 (L) 11/02/2018   ANIONGAP 11 11/02/2018   Lab Results  Component Value Date   CHOL 111 11/04/2012   Lab Results  Component Value Date   HDL 35 (L) 11/04/2012   Lab Results  Component Value Date   LDLCALC 55 11/04/2012   Lab Results  Component Value Date   TRIG 104 11/04/2012   Lab Results  Component Value Date   CHOLHDL 3.2 11/04/2012   Lab Results  Component Value Date   HGBA1C 6.1 (H) 11/04/2012      Assessment & Plan:   Problem List Items Addressed This Visit      Nervous and Auditory   Labyrinthitis - Primary      No orders of the defined types were placed in this encounter.   Follow-up: Return Follow up with Dr. Larose Kells for possible neurology second opinion..   Patient was given information on labyrinthitis, Epley maneuvers  And Mnire's disease.  Suggest possible  second opinion with another neurologist. Libby Maw, MD

## 2019-05-25 ENCOUNTER — Ambulatory Visit: Payer: Medicare Other | Admitting: Cardiology

## 2019-07-18 ENCOUNTER — Telehealth: Payer: Self-pay | Admitting: Cardiology

## 2019-07-18 NOTE — Telephone Encounter (Signed)
Patient wants to know why he has an appointment on 07/21/19 with Dr. Gwenlyn Found. He states when he saw Almyra Deforest in February everything was fine. Please advise.

## 2019-07-18 NOTE — Telephone Encounter (Signed)
Returned call to patient he stated he has appointment with [facility] 5/28.Stated he has never seen Dr.Berry before and does not know why appointment was scheduled.Appointment cancelled.Follow up appointment scheduled with Dr.Jordan 7/13 at 1:40 pm.

## 2019-07-21 ENCOUNTER — Ambulatory Visit: Payer: Medicare Other | Admitting: Cardiovascular Disease

## 2019-08-01 IMAGING — CR CHEST - 2 VIEW
2 series · 2 of 2 positions shown · non-contrast
Comparison: Radiograph 12/11/2012

CLINICAL DATA: Dizziness and weakness.  Ex-smoker

EXAM:
CHEST - 2 VIEW

[w chest pa]
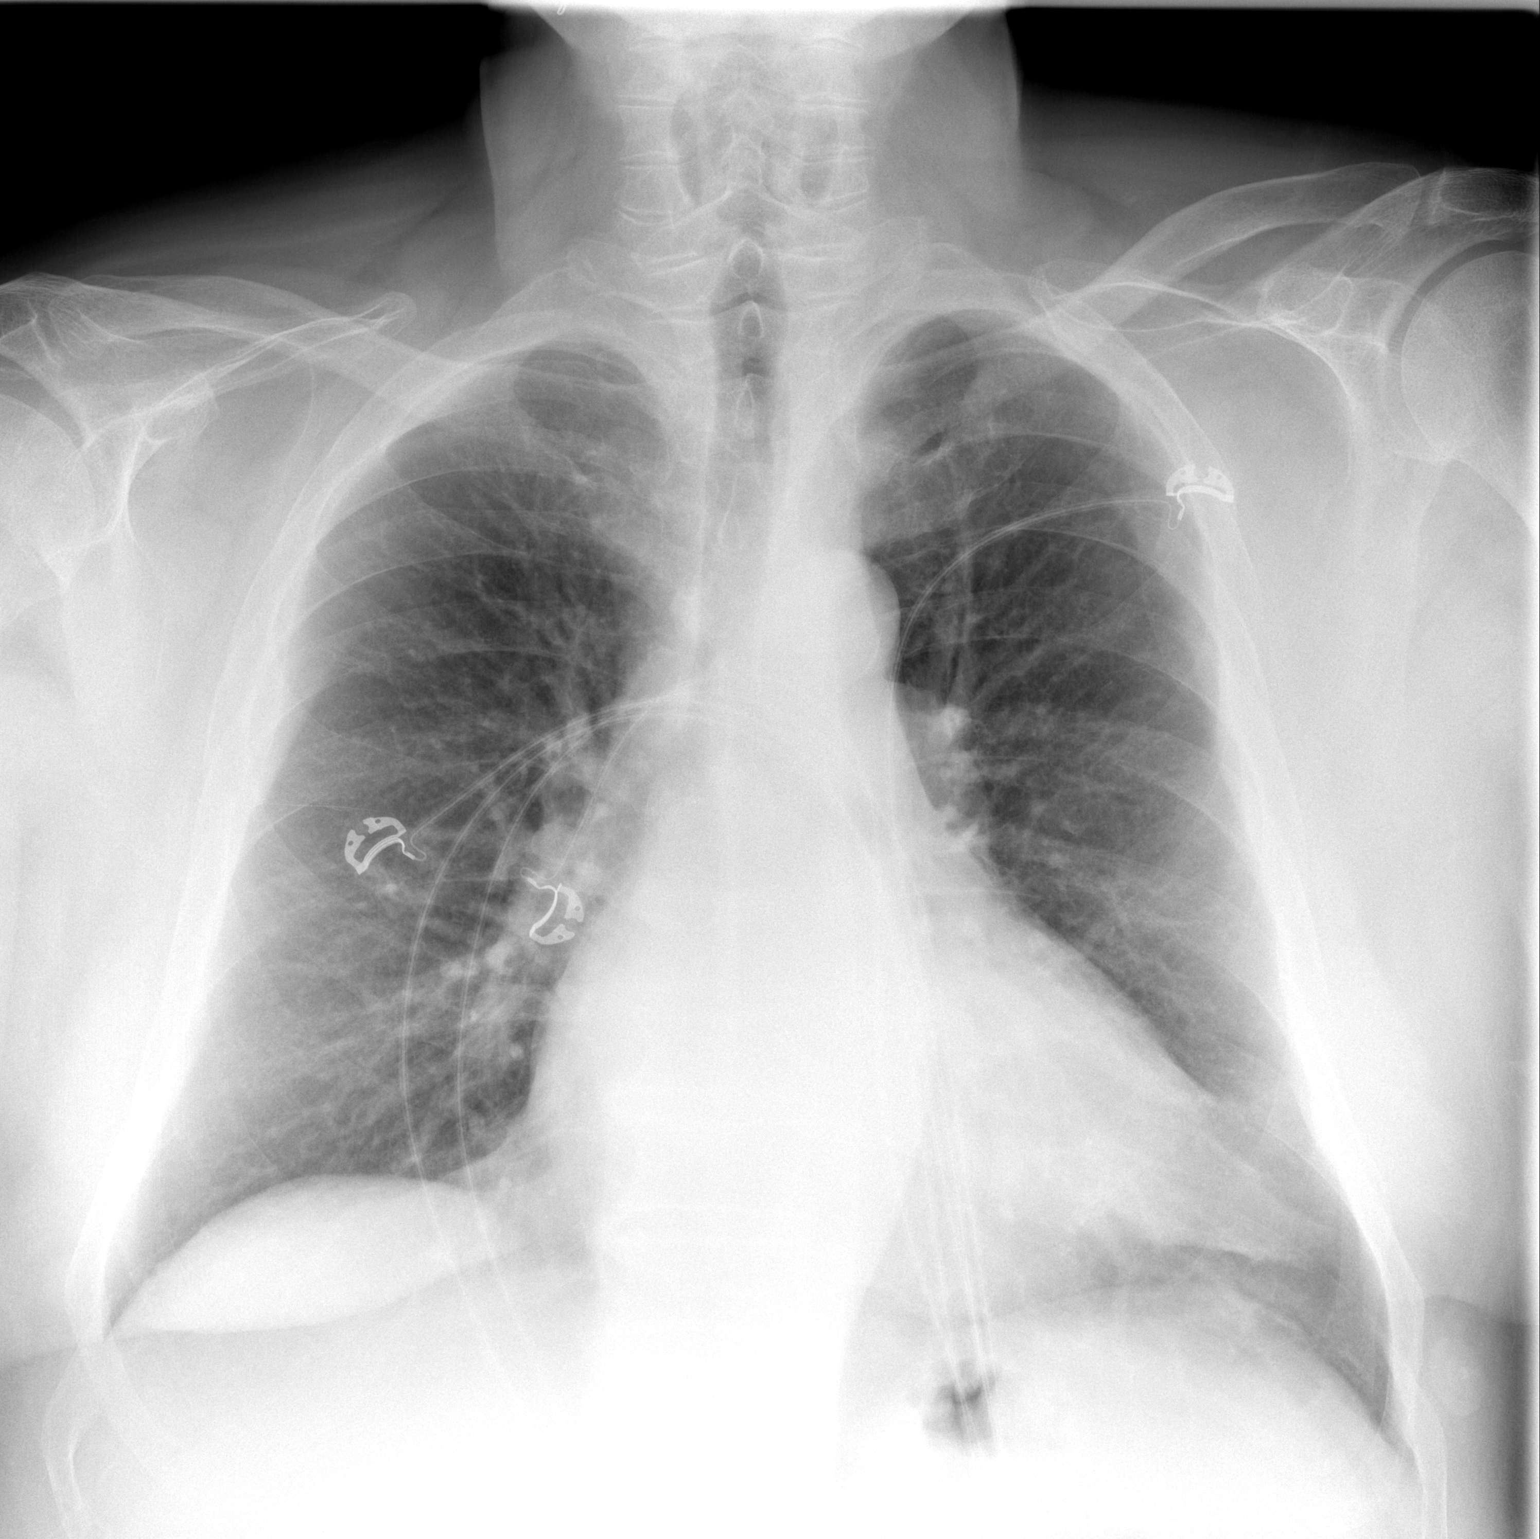

[w chest lat]
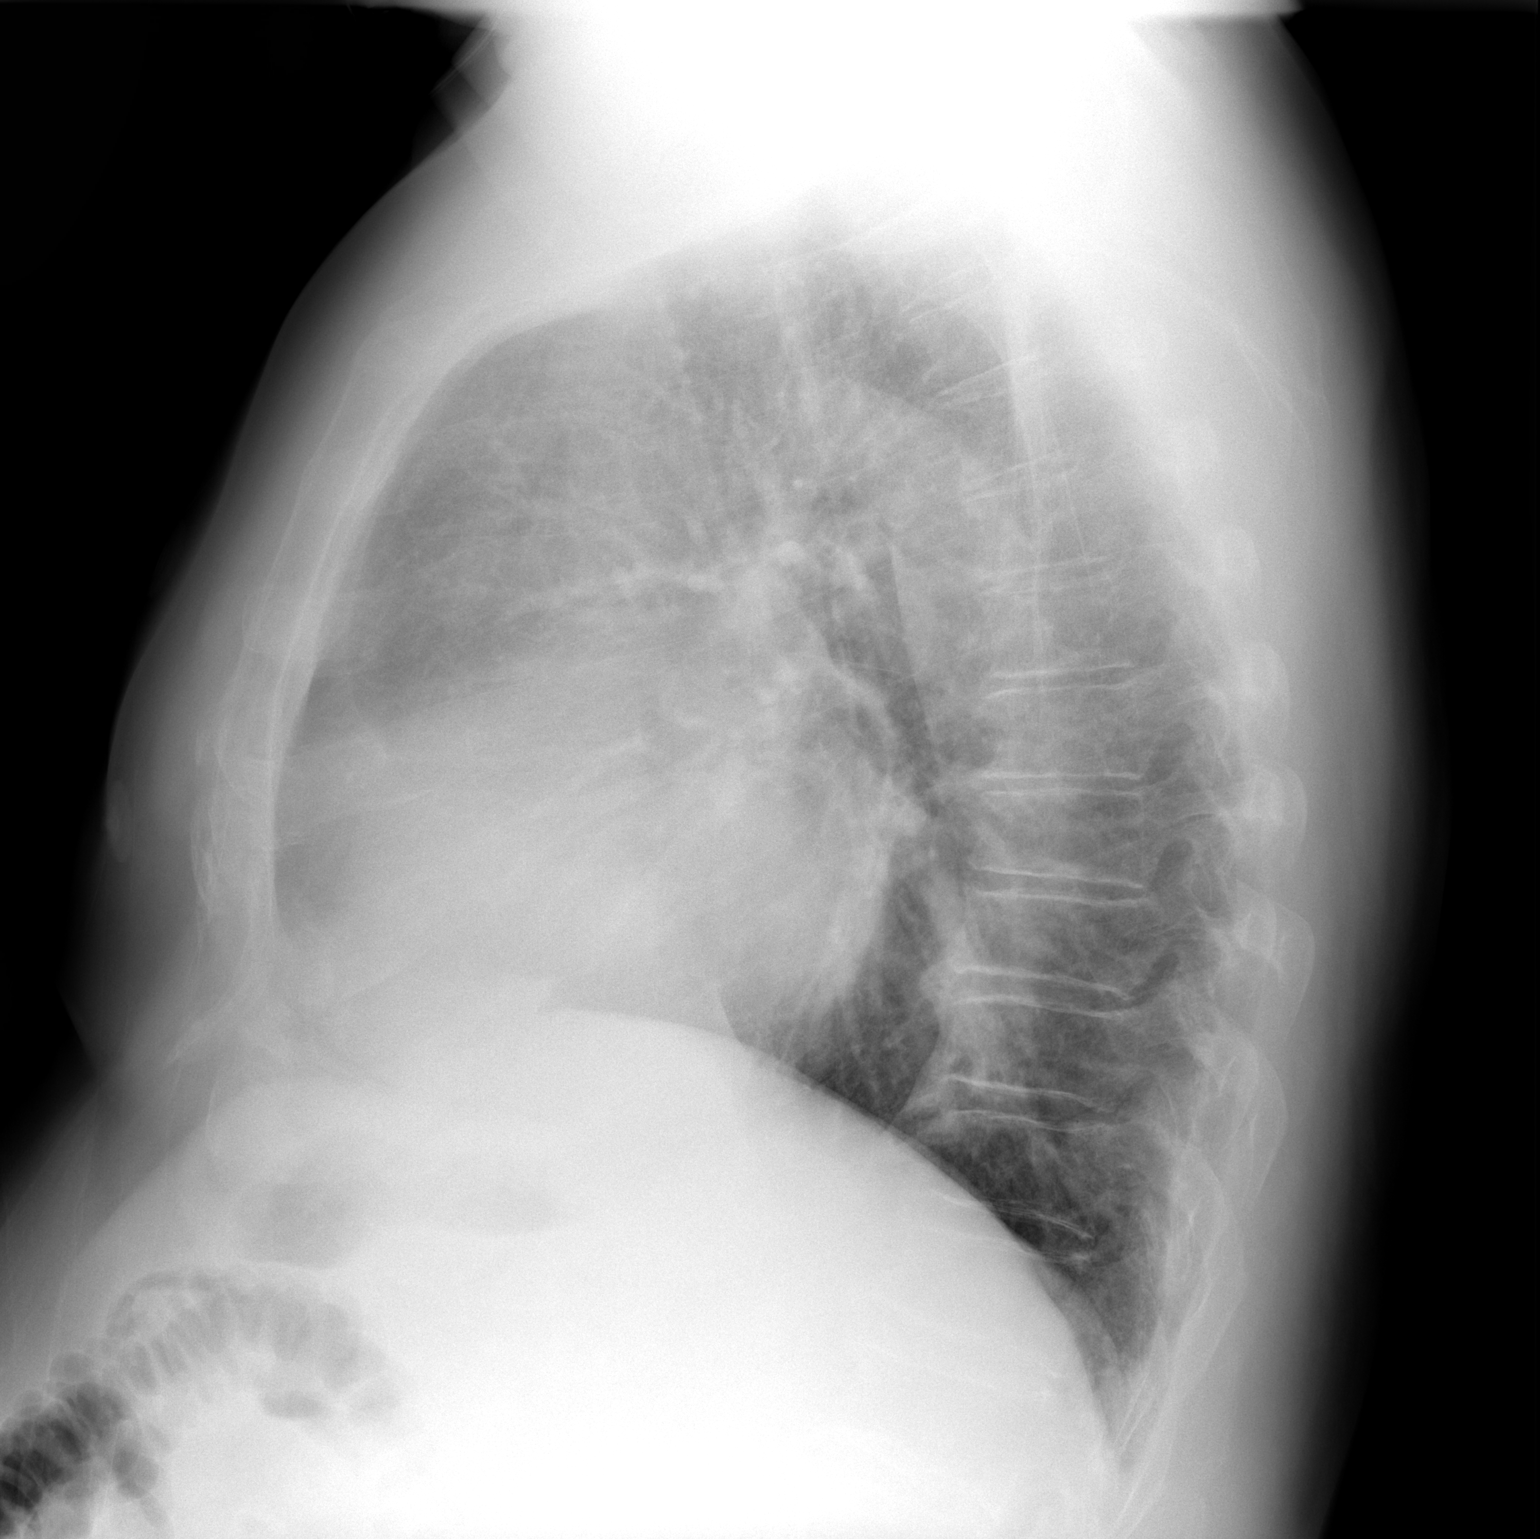

[2 of 2 positions shown; findings below may reference images not displayed]

FINDINGS: Normal cardiac silhouette ectatic aorta. Band of atelectasis in the
lingula. No focal consolidation. No pleural fluid. No pneumothorax.
Degenerative osteophytosis of the spine.
IMPRESSION: Lingular atelectasis.  No acute findings.

## 2019-08-10 ENCOUNTER — Telehealth: Payer: Self-pay | Admitting: Cardiology

## 2019-08-10 NOTE — Telephone Encounter (Signed)
Called and spoke with pt, he reports that the tops of his feet were swollen and there was redness going 10 inches up his legs bilaterally. He states that he had his feet elevated over his head for 20 minutes and it is somewhat better now.  He reports that the redness is blanchable.  He denies pain to the touch, or that they are overly warm.  Pt states he has Hashimoto's but didn't know if this is what was causing his issue. Pt reports that the swelling seems to be gone and that he was originally wearing compression stockings but they were very tight and may have been cutting his circulation off. Pt reports that he went shopping and had no issues with walking or with any sob. He reports that his legs are a little itchy but he was given cream during an appt with Isaac Laud. He reports that the itching was worse prior to this cream. He denies current sob or cp. He reports that he has gained some weight but he thinks it is from his Hashimotos. He reports being very active. Pt states that he has AFIB and some dizziness and that he has been trying to figure out what has been going on with this for the last year. His BP today was 134/60s and HR 60s.  Advised that he keep his feet elevated and that I would send a message to Dr.Jordan and Isaac Laud PA for recommendations. Also advised that if he began to have any SOB or CP to seek immediate evaluation in the ED. Pt verbalized understanding with no other questions at this time.

## 2019-08-10 NOTE — Telephone Encounter (Signed)
I suspect from prior evaluation he has venous stasis. In the past EF has been normal and LE arterial dopplers were OK. I would just keep feet elevated when possible. Restrict salt intake as much as possible. Keep appt. With me next month.  Michael Whitney [name] MD, [facility]

## 2019-08-10 NOTE — Telephone Encounter (Signed)
Reviewed Dr.Jordan's recommendations with the pt. Pt verbalized understanding and had no other questions at this time. Advised that if he has any other issues to let us know. Pt thankful for the call.

## 2019-08-10 NOTE — Telephone Encounter (Signed)
Spoke to patient Dr.Jordan's advice given. 

## 2019-08-10 NOTE — Telephone Encounter (Signed)
Pt c/o swelling: STAT is pt has developed SOB within 24 hours  1) How much weight have you gained and in what time span? no  2) If swelling, where is the swelling located? Swelling on feet   3) Are you currently taking a fluid pill? No   4) Are you currently SOB? No   5) Do you have a log of your daily weights (if so, list)? No   6) Have you gained 3 pounds in a day or 5 pounds in a week? No  Patient has noticed swelling on his legs and redness.   7) Have you traveled recently? No

## 2019-08-14 ENCOUNTER — Telehealth: Payer: Self-pay | Admitting: Family Medicine

## 2019-08-14 NOTE — Telephone Encounter (Signed)
Spoke with patient he stated he was not feeling well and did not want to schedule AWV at this time.

## 2019-08-16 ENCOUNTER — Telehealth: Payer: Self-pay | Admitting: Cardiology

## 2019-08-16 ENCOUNTER — Other Ambulatory Visit: Payer: Self-pay

## 2019-08-16 ENCOUNTER — Telehealth: Payer: Self-pay

## 2019-08-16 ENCOUNTER — Ambulatory Visit: Payer: Medicare Other | Admitting: Physician Assistant

## 2019-08-16 VITALS — BP 126/72 | HR 82 | Ht 75.0 in | Wt 279.0 lb

## 2019-08-16 DIAGNOSIS — I4821 Permanent atrial fibrillation: Secondary | ICD-10-CM

## 2019-08-16 DIAGNOSIS — E063 Autoimmune thyroiditis: Secondary | ICD-10-CM

## 2019-08-16 DIAGNOSIS — R002 Palpitations: Secondary | ICD-10-CM

## 2019-08-16 DIAGNOSIS — I10 Essential (primary) hypertension: Secondary | ICD-10-CM

## 2019-08-16 DIAGNOSIS — R0600 Dyspnea, unspecified: Secondary | ICD-10-CM

## 2019-08-16 NOTE — Telephone Encounter (Signed)
14 day Zio AT registered to be mailed to pt's home address.  

## 2019-08-16 NOTE — Telephone Encounter (Signed)
  Patient c/o Palpitations:  High priority if patient c/o lightheadedness, shortness of breath, or chest pain  1) How long have you had palpitations/irregular HR/ Afib? Are you having the symptoms now? afib  2) Are you currently experiencing lightheadedness, SOB or CP? lightheaded  3) Do you have a history of afib (atrial fibrillation) or irregular heart rhythm? Yes, afib  4) Have you checked your BP or HR? (document readings if available): 172/86 HR 58, 182/96 HR 57 and 128/75 HR 66  5) Are you experiencing any other symptoms? Patient states that he woke up in the middle of the night and his left foot was swelling. He states his heart was racing so he checked his BP. The above readings are in order in which he took them. He states his BP was high, 172/86 and 182/96.He checked it about an hour and a half later and it dropped to 128/75. He states that he did experience some dizziness. He is still complaining of lightheadedness and feeling unsure on his feet.

## 2019-08-16 NOTE — Telephone Encounter (Signed)
Received a call from patient.He stated he woke up last night with fast heart beat,dizzy,light headed.B/P systolic 711.Stated B/P at present 159/96 pulse 67.He continues to feel light headed and dizzy.Swelling in left foot.No chest pain.Appointment scheduled with Almyra Deforest PA this afternoon at 2:15 pm.Advised to go to ED if he feels worse.

## 2019-08-16 NOTE — Patient Instructions (Addendum)
Medication Instructions:  Your physician recommends that you continue on your current medications as directed. Please refer to the Current Medication list given to you today.  *If you need a refill on your cardiac medications before your next appointment, please call your pharmacy*  Lab Work: NONE ordered at this time of appointment   If you have labs (blood work) drawn today and your tests are completely normal, you will receive your results only by: Marland Kitchen MyChart Message (if you have MyChart) OR . A paper copy in the mail If you have any lab test that is abnormal or we need to change your treatment, we will call you to review the results.  Testing/Procedures: Your physician has requested that you have an echocardiogram. Echocardiography is a painless test that uses sound waves to create images of your heart. It provides your doctor with information about the size and shape of your heart and how well your heart's chambers and valves are working. This procedure takes approximately one hour. There are no restrictions for this procedure.   PLEASE schedule for 1-2 weeks   ZIO XT- Long Term Monitor Instructions   Your physician has requested you wear your ZIO patch monitor 14 days.   This is a single patch monitor.  [employer] supplies one patch monitor per enrollment.  Additional stickers are not available.   Please do not apply patch if you will be having a Nuclear Stress Test, Echocardiogram, Cardiac CT, MRI, or Chest Xray during the time frame you would be wearing the monitor. The patch cannot be worn during these tests.  You cannot remove and re-apply the ZIO XT patch monitor.   Your ZIO patch monitor will be sent USPS Priority mail from [facility] directly to your home address. The monitor may also be mailed to a PO BOX if home delivery is not available.   It may take 3-5 days to receive your monitor after you have been enrolled.   Once you have received you monitor, please review  enclosed instructions.  Your monitor has already been registered assigning a specific monitor serial # to you.   Applying the monitor   Shave hair from upper left chest.   Hold abrader disc by orange tab.  Rub abrader in 40 strokes over left upper chest as indicated in your monitor instructions.   Clean area with 4 enclosed alcohol pads .  Use all pads to assure are is cleaned thoroughly.  Let dry.   Apply patch as indicated in monitor instructions.  Patch will be place under collarbone on left side of chest with arrow pointing upward.   Rub patch adhesive wings for 2 minutes.Remove white label marked "1".  Remove white label marked "2".  Rub patch adhesive wings for 2 additional minutes.   While looking in a mirror, press and release button in center of patch.  A small green light will flash 3-4 times .  This will be your only indicator the monitor has been turned on.     Do not shower for the first 24 hours.  You may shower after the first 24 hours.   Press button if you feel a symptom. You will hear a small click.  Record Date, Time and Symptom in the Patient Log Book.   When you are ready to remove patch, follow instructions on last 2 pages of Patient Log Book.  Stick patch monitor onto last page of Patient Log Book.   Place Patient Log Book in [place] box.  Use locking tab on box and tape box closed securely.  The [employer] and [employer] has [employer] on it.  Please place in mailbox as soon as possible.  Your physician should have your test results approximately 7 days after the monitor has been mailed back to [facility].   Call [place] at [phone] if you have questions regarding your ZIO XT patch monitor.  Call them immediately if you see an orange light blinking on your monitor.   If your monitor falls off in less than 4 days contact our Monitor department at [phone].  If your monitor becomes loose or falls off after 4 days call [employer] at  [phone] for suggestions on securing your monitor.   Follow-Up: At [facility], you and your health needs are our priority.  As part of our continuing mission to provide you with exceptional heart care, we have created designated Provider Care Teams.  These Care Teams include your primary Cardiologist (physician) and Advanced Practice Providers (APPs -  Physician Assistants and Nurse Practitioners) who all work together to provide you with the care you need, when you need it.  We recommend signing up for the patient portal called "MyChart".  Sign up information is provided on this After Visit Summary.  MyChart is used to connect with patients for Virtual Visits (Telemedicine).  Patients are able to view lab/test results, encounter notes, upcoming appointments, etc.  Non-urgent messages can be sent to your provider as well.   To learn more about what you can do with MyChart, go to [email or web address].    Your next appointment:   5-6 week(s)  The format for your next appointment:   In Person  Provider:   Peter [name], MD  Other Instructions

## 2019-08-16 NOTE — Progress Notes (Signed)
Cardiology Office Note:    Date:  08/18/2019   ID:  Michael Whitney, DOB Aug 12, 1950, MRN [id number]  PCP:  Libby Maw, MD  [facility] Cardiologist:  Peter [name], MD  [facility] Electrophysiologist:  Cristopher Peru, MD   Referring MD: Libby Maw,*   Chief Complaint  Patient presents with  . Follow-up    seen for Dr. [name]    History of Present Illness:    Michael Whitney is a 69 y.o. male with a hx of permanent atrial fibrillation, hypertension, hyperthyroidism/Hashimoto thyroiditis and history of seizure. Event monitor obtained in 2014 was unremarkable. He only ever had one seizure episode, he was initially on Keppra, this was later discontinued. In January 2019, he was diagnosed with atrial fibrillation with controlled heart rate after he presented with lightheadedness, fatigue, dizziness and a 50 pound weight gain. The duration of atrial fibrillation was unknown. He was started on anticoagulation therapy. Dr. [name] pursued a rate control and anticoagulation strategy. Due to significant recurrent dizzy spell and fatigue, Holter monitor was placed in 2019 that showed atrial fibrillation with slow ventricular rate. He also had high PVC burden. He underwent EP study by Dr. Lovena Le which revealed that his PVCs were arising near his His bundle. Ablation was not recommended due to concern of potential heart block. He was prescribed mexiletine. 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 Patient was last seen by Dr. [name] in November 2020, at which time he mentioned he was admitted to [facility] in October 2020. Echocardiogram wasokay but showed moderate MR. He felt majority of his symptoms at the time was related to thyroiditis.  Patient underwent radioactive iodine treatment and was placed on methimazole earlier this year due to  persistent hyperthyroidism.  I last saw the patient in April 12, 2019, he apparently went [facility] ED the day prior with flushing sensation and dizziness.  I suspect that he had a vasovagal episode.  High-sensitivity troponin was initially 312, this decreased to 269 upon repeat lab work.  MRI of the brain was negative for stroke.  He was instructed to follow-up with cardiology service.  Due to lower extremity tingling sensation, ABI was obtained which was normal.  Given the elevated troponin, Myoview performed on 04/27/2019 showed mild thinning of the inferior wall consistent with soft tissue attenuation, no ischemia was noted, echocardiogram was recommended to assess the inferior wall due to wall motion.  Patient presents today for cardiology office visit.  He continued to have significant dizziness.  He has also experienced some recent palpitation as well along with dyspnea.  I recommended a heart monitor and repeat echocardiogram.  Otherwise he denies any significant chest discomfort.  Past Medical History:  Diagnosis Date  . Atrial fibrillation ([place])   . Cataract   . Hypertension   . Seizures ([place])     Past Surgical History:  Procedure Laterality Date  . CATARACT EXTRACTION    . PVC ABLATION N/A 07/14/2017   Procedure: PVC ABLATION;  Surgeon: Evans Lance, MD;  Location: [place] [facility];  Service: Cardiovascular;  Laterality: N/A;  . TONSILLECTOMY    . VARICOSE VEIN SURGERY      Current Medications: Current Meds  Medication Sig  . Accu-Chek FastClix Lancets MISC Use 1 lancet daily to test blood sugar (E11.9)  . ascorbic acid (VITAMIN C) 1000 MG tablet Take  by mouth.  Marland Kitchen BIOTIN PO Take 1 tablet by mouth daily.  . Calcium Carb-Cholecalciferol (CALCIUM 600+D3 PO) Take 1 tablet by mouth daily.  . Cholecalciferol (VITAMIN D-3) 25 MCG (1000 UT) CAPS Take by mouth.  Arne Cleveland 5 MG TABS tablet Take 1 tablet by mouth twice daily  . losartan (COZAAR) 25 MG tablet Take 25 mg by  mouth daily.  . metFORMIN (GLUCOPHAGE-XR) 500 MG 24 hr tablet Take 500 mg by mouth daily with breakfast.  . Multiple Vitamin (MULTI-VITAMIN) tablet Take by mouth.  . Multiple Vitamin (MULTIVITAMIN WITH MINERALS) TABS tablet Take 1 tablet by mouth daily.  . Omega-3 Fatty Acids (FISH OIL PO) Take by mouth.  . [DISCONTINUED] methimazole (TAPAZOLE) 5 MG tablet Take 5 mg by mouth daily.     Allergies:   Patient has no known allergies.   Social History   Socioeconomic History  . Marital status: Widowed    Spouse name: Not on file  . Number of children: 2  . Years of education: Not on file  . Highest education level: Not on file  Occupational History  . Occupation: retired    Comment: Geophysicist/field seismologist  Tobacco Use  . Smoking status: Never Smoker  . Smokeless tobacco: Never Used  Vaping Use  . Vaping Use: Never used  Substance and Sexual Activity  . Alcohol use: Yes    Alcohol/week: 2.0 standard drinks    Types: 1 Shots of liquor, 1 Cans of beer per week    Comment: occasionally  . Drug use: No  . Sexual activity: Not on file  Other Topics Concern  . Not on file  Social History Narrative  . Not on file   Social Determinants of Health   Financial Resource Strain:   . Difficulty of Paying Living Expenses:   Food Insecurity:   . Worried About Charity fundraiser in the Last Year:   . Arboriculturist in the Last Year:   Transportation Needs:   . Film/video editor (Medical):   Marland Kitchen Lack of Transportation (Non-Medical):   Physical Activity:   . Days of Exercise per Week:   . Minutes of Exercise per Session:   Stress:   . Feeling of Stress :   Social Connections:   . Frequency of Communication with Friends and Family:   . Frequency of Social Gatherings with Friends and Family:   . Attends Religious Services:   . Active Member of Clubs or Organizations:   . Attends Archivist Meetings:   Marland Kitchen Marital Status:      Family History: The patient's family history  includes CAD in his mother.  ROS:   Please see the history of present illness.     All other systems reviewed and are negative.  EKGs/Labs/Other Studies Reviewed:    The following studies were reviewed today:  Myoview 04/27/2019  Lexiscan stress without ST changes to suggest ischemia  Mild thinning in the inferior wall (base, mid) Consistent with soft tissue attenuation (diaphragm), cannot exclude scar as images were not gated due to atrial fibrillation.. No ischemia noted.  Would recomm echo to further define wall motion, particularly in the inferior wall.   EKG:  EKG is ordered today.  The ekg ordered today demonstrates atrial fibrillation, heart rate 82, single PVC, right bundle branch block.  Recent Labs: 11/02/2018: ALT 26; BUN 12; Creatinine, Ser 1.00; Hemoglobin 16.4; Platelets 147; Potassium 4.4; Sodium 138  Recent Lipid Panel    Component Value Date/Time  CHOL 111 11/04/2012 1125   TRIG 104 11/04/2012 1125   HDL 35 (L) 11/04/2012 1125   CHOLHDL 3.2 11/04/2012 1125   VLDL 21 11/04/2012 1125   LDLCALC 55 11/04/2012 1125    Physical Exam:    VS:  BP 126/72   Pulse 82   Ht 6\' 3"  (1.905 m)   Wt 279 lb (126.6 kg)   BMI 34.87 kg/m     Wt Readings from Last 3 Encounters:  08/16/19 279 lb (126.6 kg)  05/01/19 277 lb 4.8 oz (125.8 kg)  04/27/19 279 lb (126.6 kg)     GEN:  Well nourished, well developed in no acute distress HEENT: Normal NECK: No JVD; No carotid bruits LYMPHATICS: No lymphadenopathy CARDIAC: RRR, no murmurs, rubs, gallops RESPIRATORY:  Clear to auscultation without rales, wheezing or rhonchi  ABDOMEN: Soft, non-tender, non-distended MUSCULOSKELETAL:  No edema; No deformity  SKIN: Warm and dry NEUROLOGIC:  Alert and oriented x 3 PSYCHIATRIC:  Normal affect   ASSESSMENT:    1. Palpitations   2. Dyspnea, unspecified type   3. Permanent atrial fibrillation ([place])   4. Essential hypertension   5. Hashimoto's thyroiditis    PLAN:    In order  of problems listed above:  1. Palpitation: Obtain 2-week heart monitor.  2. Dyspnea: Repeat echocardiogram.  Recent Myoview showed no significant ischemia.  3. Permanent atrial fibrillation: On Eliquis.  Self rate controlled.  4. Hypertension: Blood pressure well controlled  5. Hashimoto's thyroiditis: He underwent iodine treatment earlier this year.   Medication Adjustments/Labs and Tests Ordered: Current medicines are reviewed at length with the patient today.  Concerns regarding medicines are outlined above.  Orders Placed This Encounter  Procedures  . LONG TERM MONITOR-LIVE TELEMETRY (3-14 DAYS)  . EKG 12-Lead  . ECHOCARDIOGRAM COMPLETE   No orders of the defined types were placed in this encounter.   Patient Instructions  Medication Instructions:  Your physician recommends that you continue on your current medications as directed. Please refer to the Current Medication list given to you today.  *If you need a refill on your cardiac medications before your next appointment, please call your pharmacy*  Lab Work: NONE ordered at this time of appointment   If you have labs (blood work) drawn today and your tests are completely normal, you will receive your results only by: Marland Kitchen MyChart Message (if you have MyChart) OR . A paper copy in the mail If you have any lab test that is abnormal or we need to change your treatment, we will call you to review the results.  Testing/Procedures: Your physician has requested that you have an echocardiogram. Echocardiography is a painless test that uses sound waves to create images of your heart. It provides your doctor with information about the size and shape of your heart and how well your heart's chambers and valves are working. This procedure takes approximately one hour. There are no restrictions for this procedure.   PLEASE schedule for 1-2 weeks   ZIO XT- Long Term Monitor Instructions   Your physician has requested you wear your  ZIO patch monitor 14 days.   This is a single patch monitor.  [employer] supplies one patch monitor per enrollment.  Additional stickers are not available.   Please do not apply patch if you will be having a Nuclear Stress Test, Echocardiogram, Cardiac CT, MRI, or Chest Xray during the time frame you would be wearing the monitor. The patch cannot be worn during these tests.  You  cannot remove and re-apply the ZIO XT patch monitor.   Your ZIO patch monitor will be sent USPS Priority mail from [facility] directly to your home address. The monitor may also be mailed to a PO BOX if home delivery is not available.   It may take 3-5 days to receive your monitor after you have been enrolled.   Once you have received you monitor, please review enclosed instructions.  Your monitor has already been registered assigning a specific monitor serial # to you.   Applying the monitor   Shave hair from upper left chest.   Hold abrader disc by orange tab.  Rub abrader in 40 strokes over left upper chest as indicated in your monitor instructions.   Clean area with 4 enclosed alcohol pads .  Use all pads to assure are is cleaned thoroughly.  Let dry.   Apply patch as indicated in monitor instructions.  Patch will be place under collarbone on left side of chest with arrow pointing upward.   Rub patch adhesive wings for 2 minutes.Remove white label marked "1".  Remove white label marked "2".  Rub patch adhesive wings for 2 additional minutes.   While looking in a mirror, press and release button in center of patch.  A small green light will flash 3-4 times .  This will be your only indicator the monitor has been turned on.     Do not shower for the first 24 hours.  You may shower after the first 24 hours.   Press button if you feel a symptom. You will hear a small click.  Record Date, Time and Symptom in the Patient Log Book.   When you are ready to remove patch, follow instructions on last 2 pages of  Patient Log Book.  Stick patch monitor onto last page of Patient Log Book.   Place Patient Log Book in [place] box.  Use locking tab on box and tape box closed securely.  The [employer] has [employer] on it.  Please place in mailbox as soon as possible.  Your physician should have your test results approximately 7 days after the monitor has been mailed back to [facility].   Call [place] at [phone] if you have questions regarding your ZIO XT patch monitor.  Call them immediately if you see an orange light blinking on your monitor.   If your monitor falls off in less than 4 days contact our Monitor department at [phone].  If your monitor becomes loose or falls off after 4 days call [employer] at [phone] for suggestions on securing your monitor.   Follow-Up: At [facility], you and your health needs are our priority.  As part of our continuing mission to provide you with exceptional heart care, we have created designated Provider Care Teams.  These Care Teams include your primary Cardiologist (physician) and Advanced Practice Providers (APPs -  Physician Assistants and Nurse Practitioners) who all work together to provide you with the care you need, when you need it.  We recommend signing up for the patient portal called "MyChart".  Sign up information is provided on this After Visit Summary.  MyChart is used to connect with patients for Virtual Visits (Telemedicine).  Patients are able to view lab/test results, encounter notes, upcoming appointments, etc.  Non-urgent messages can be sent to your provider as well.   To learn more about what you can do with MyChart, go to [email or web address].    Your next  appointment:   5-6 week(s)  The format for your next appointment:   In Person  Provider:   Peter [name], MD  Other Instructions      Signed, Almyra Deforest, [place]  08/18/2019 11:27 PM    [place]

## 2019-08-18 ENCOUNTER — Encounter: Payer: Self-pay | Admitting: Physician Assistant

## 2019-08-24 ENCOUNTER — Telehealth: Payer: Self-pay | Admitting: Physician Assistant

## 2019-08-24 ENCOUNTER — Ambulatory Visit (INDEPENDENT_AMBULATORY_CARE_PROVIDER_SITE_OTHER): Payer: Medicare Other

## 2019-08-24 DIAGNOSIS — R002 Palpitations: Secondary | ICD-10-CM | POA: Diagnosis not present

## 2019-08-24 NOTE — Telephone Encounter (Signed)
[employer] called to say Michael Whitney was in Atrial fibrillation.  Rosaria Ferries, PA-C 08/24/2019 5:37 PM

## 2019-08-29 ENCOUNTER — Ambulatory Visit (HOSPITAL_COMMUNITY)
Admission: RE | Admit: 2019-08-29 | Discharge: 2019-08-29 | Disposition: A | Discharge status: Home / Self Care | Payer: Medicare Other | Source: Ambulatory Visit | Attending: Physician Assistant | Admitting: Physician Assistant

## 2019-08-29 ENCOUNTER — Telehealth: Payer: Self-pay | Admitting: Cardiology

## 2019-08-29 ENCOUNTER — Other Ambulatory Visit: Payer: Self-pay

## 2019-08-29 DIAGNOSIS — I499 Cardiac arrhythmia, unspecified: Secondary | ICD-10-CM | POA: Diagnosis not present

## 2019-08-29 DIAGNOSIS — I4891 Unspecified atrial fibrillation: Secondary | ICD-10-CM | POA: Diagnosis not present

## 2019-08-29 DIAGNOSIS — R002 Palpitations: Secondary | ICD-10-CM | POA: Diagnosis not present

## 2019-08-29 DIAGNOSIS — R569 Unspecified convulsions: Secondary | ICD-10-CM | POA: Insufficient documentation

## 2019-08-29 DIAGNOSIS — R0600 Dyspnea, unspecified: Secondary | ICD-10-CM | POA: Diagnosis not present

## 2019-08-29 DIAGNOSIS — I10 Essential (primary) hypertension: Secondary | ICD-10-CM | POA: Insufficient documentation

## 2019-08-29 NOTE — Telephone Encounter (Signed)
Left message for pt to call.

## 2019-08-29 NOTE — Telephone Encounter (Signed)
Patient returning call.

## 2019-08-29 NOTE — Telephone Encounter (Signed)
Spoke with pt, he is having weakness, lightheadedness and dizziness pretty much all the time. He has had it for sometime. He is wanting to know if the monitor had showed any arrhthymias. Reassurance given to the patient no alerts have been seen yet. He was in atrial fib when he started the monitor but he has that history. He will continue to monitor and let us know of any concerns he has.

## 2019-08-29 NOTE — Telephone Encounter (Signed)
STAT if patient feels like he/she is going to faint   1) Are you dizzy now? A little bit  2) Do you feel faint or have you passed out? Has not passed out. Sometimes feels faint, but not now  3) Do you have any other symptoms? tired  4) Have you checked your HR and BP (record if available)? Has been normal in the 120's  Patient states he has been dizzy all the time and would like to know if his heart monitor can be checked to see if it could be his heart causing the dizziness.

## 2019-08-29 NOTE — Progress Notes (Signed)
  Echocardiogram 2D Echocardiogram has been performed.  Michael Whitney G Bruno Leach 08/29/2019, 3:30 PM

## 2019-09-01 ENCOUNTER — Telehealth: Payer: Self-pay

## 2019-09-01 NOTE — Telephone Encounter (Signed)
While on the phone with patient this morning giving him his Echo results patient stated that he needed to speak with someone about his monitor not sticking. Clinic supervisor was standing near so I asked her to take the call. Patient was asked if he followed the directions about shaving the area, no lotions and/or oils, if he skin was completely dry from water/sweat/alcohol prep. Patient stated that he followed the directions because he called the company who assisted him. Spoke with my clinic supervisor and she asked me to reach out to the monitor techs and she will reach out to the Rep for the monitor about what can be done. Spoke with Valetta Fuller the tech, I explained what was going on with the patient and the monitor not sticking. She stated that she can have another monitor mailed to the patient possibly overnight. I called to inform the patient he stated that our office is too far and asked why he can't bring it in on his appointment at the end of the month with his cardiologist Dr. [name]. I stated to the patient that he informed us earlier that the monitor was not sticking well and that at one point the monitor was stuck to his head upon waking one morning. Placed patient on hold to speak with my supervisor. She asked me to see if patient is willing to go to the [facility] if so she is willing to go to the office to apply the monior she wants to know what time he will be there. Patient stated that he see that the [facility] office is kind of far too and wanted to know if he can just take it to his PCP office and have someone apply it. I told the patient to just hold onto the new monitor and I will give him a call Monday morning and we will go from there. Patient verbalized understanding and thanked me for calling.

## 2019-09-04 NOTE — Telephone Encounter (Signed)
Routed to [place] who spoke with patient on 09/01/19

## 2019-09-04 NOTE — Telephone Encounter (Signed)
I reached out to [facility] the monitor tech to inform her what was going on with the patient and the recent incidents with the current monitor the patient is wearing and the recent monitor that was ordered by Valetta Fuller the other monitor tech. Shelly informed me that she will look into and get back with me.

## 2019-09-04 NOTE — Telephone Encounter (Signed)
Called and spoke with the patient this morning at 8:30 AM this morning to ask if the monitor arrived and arrange for him to come into the office to have it applied. Patient stated that he called the customer services number on Saturday because there was a red light flashing on the monitor and that he asked about when the new monitor would arrive. Patient stated that the person he spoke with on the phone told him that the monitor was cancelled due to  there were about 9 days that reading on the monitor. I informed the patient that I will get back to him because I will have to get in contact with the monitor techs to find out if they may know what is going on with the monitor. Patient verbalized an understanding and thanked me for calling.

## 2019-09-04 NOTE — Telephone Encounter (Signed)
Patient is calling to follow up in regards to new monitor.

## 2019-09-05 ENCOUNTER — Telehealth: Payer: Self-pay | Admitting: Cardiology

## 2019-09-05 ENCOUNTER — Ambulatory Visit: Payer: Medicare Other | Admitting: Cardiology

## 2019-09-05 NOTE — Telephone Encounter (Signed)
Ok to hold losartan as long as BP stays under control. Let us know   Pema Thomure [name] MD, [facility]

## 2019-09-05 NOTE — Telephone Encounter (Signed)
Called patient to inquire if he has received the new monitor and he stated that he has not received it yet. I informed him that I would give him a call tomorrow (09/06/19) morning between 8:30 AM and 9 AM to see if he received it and set up for him to come into the [facility] or [facility] to have the monitor applied. Patient verbalized an understanding.

## 2019-09-05 NOTE — Telephone Encounter (Addendum)
Meredith called Shelly back on 09/04/19. Per [facility] patient had 9 days of data so they wanted to verify with the provider needed 14. Darrick Penna stated that she told them 14 was ordered. Patient was expecting replacement monitor. It is being shipped overnight. She also stated, there was alot of artifact in the 9 days of data

## 2019-09-05 NOTE — Telephone Encounter (Signed)
Spoke to pt who voiced he is concerned that he is having a reaction to losartan. He report for the past month he has developed a rash on both legs, numbness, and swelling in left foot. He also report occasional dizziness. BP has been averaging around 121/60 HR 61.   Pt inquiring if he can stop medication to see if symptoms resolve.

## 2019-09-05 NOTE — Telephone Encounter (Signed)
Shelly got back in touch with me on 09/04/19 and she stated that she will reach out to [place] from [place] to look into what was going on with the monitor and why it was canceled. She also stated that the patient is wearing an AT that he should get the full 14 days and that she will get back in touch with me once she hears from [place].

## 2019-09-05 NOTE — Telephone Encounter (Signed)
Left message to call back  

## 2019-09-05 NOTE — Progress Notes (Signed)
Subjective:   Michael Whitney is a 69 y.o. male who presents for an Initial Medicare Annual Wellness Visit.  I connected with Damin today by telephone and verified that I am speaking with the correct person using two identifiers. Location patient: home Location provider: work Persons participating in the virtual visit: patient, Marine scientist.    I discussed the limitations, risks, security and privacy concerns of performing an evaluation and management service by telephone and the availability of in person appointments. I also discussed with the patient that there may be a patient responsible charge related to this service. The patient expressed understanding and verbally consented to this telephonic visit.    Interactive audio and video telecommunications were attempted between this provider and patient, however failed, due to patient having technical difficulties OR patient did not have access to video capability.  We continued and completed visit with audio only.  Some vital signs may be absent or patient reported.   Time Spent with patient on telephone encounter: 20 minutes  Review of Systems   Cardiac Risk Factors include: advanced age (>50men, >59 women);diabetes mellitus;hypertension;obesity (BMI >30kg/m2);male gender    Objective:    Today's Vitals   09/06/19 1326  Weight: 276 lb (125.2 kg)  Height: 6\' 3"  (1.905 m)   Body mass index is 34.5 kg/m.  Advanced Directives 09/06/2019 11/02/2018 07/21/2018 07/14/2017  Does Patient Have a Medical Advance Directive? Yes No Yes Yes  Type of Advance Directive Living will;Healthcare Power of [place];Living will [place];Living will  Does patient want to make changes to medical advance directive? - - - No - Patient declined  Copy of [place] in Chart? No - copy requested - - No - copy requested  Would patient like information on creating a medical advance directive? - No -  Patient declined - -    Current Medications (verified) Outpatient Encounter Medications as of 09/06/2019  Medication Sig  . Accu-Chek FastClix Lancets MISC Use 1 lancet daily to test blood sugar (E11.9)  . ascorbic acid (VITAMIN C) 1000 MG tablet Take by mouth.  Marland Kitchen BIOTIN PO Take 1 tablet by mouth daily.  . Calcium Carb-Cholecalciferol (CALCIUM 600+D3 PO) Take 1 tablet by mouth daily.  . Cholecalciferol (VITAMIN D-3) 25 MCG (1000 UT) CAPS Take by mouth.  Arne Cleveland 5 MG TABS tablet Take 1 tablet by mouth twice daily  . metFORMIN (GLUCOPHAGE-XR) 500 MG 24 hr tablet Take 500 mg by mouth daily with breakfast.  . Multiple Vitamin (MULTI-VITAMIN) tablet Take by mouth.  . Multiple Vitamin (MULTIVITAMIN WITH MINERALS) TABS tablet Take 1 tablet by mouth daily.  . Omega-3 Fatty Acids (FISH OIL PO) Take by mouth.   No facility-administered encounter medications on file as of 09/06/2019.    Allergies (verified) Patient has no known allergies.   History: Past Medical History:  Diagnosis Date  . Atrial fibrillation ([place])   . Cataract   . Hypertension   . Seizures ([place])    Past Surgical History:  Procedure Laterality Date  . CATARACT EXTRACTION    . PVC ABLATION N/A 07/14/2017   Procedure: PVC ABLATION;  Surgeon: Evans Lance, MD;  Location: [facility];  Service: Cardiovascular;  Laterality: N/A;  . TONSILLECTOMY    . VARICOSE VEIN SURGERY     Family History  Problem Relation Age of Onset  . CAD Mother        MI at age 93   Social  History   Socioeconomic History  . Marital status: Widowed    Spouse name: Not on file  . Number of children: 2  . Years of education: Not on file  . Highest education level: Not on file  Occupational History  . Occupation: retired    Comment: Geophysicist/field seismologist  Tobacco Use  . Smoking status: Never Smoker  . Smokeless tobacco: Never Used  Vaping Use  . Vaping Use: Never used  Substance and Sexual Activity  . Alcohol use: Yes    Alcohol/week:  2.0 standard drinks    Types: 1 Shots of liquor, 1 Cans of beer per week    Comment: occasionally  . Drug use: No  . Sexual activity: Not on file  Other Topics Concern  . Not on file  Social History Narrative  . Not on file   Social Determinants of Health   Financial Resource Strain:   . Difficulty of Paying Living Expenses:   Food Insecurity:   . Worried About Charity fundraiser in the Last Year:   . Arboriculturist in the Last Year:   Transportation Needs:   . Film/video editor (Medical):   Marland Kitchen Lack of Transportation (Non-Medical):   Physical Activity: Sufficiently Active  . Days of Exercise per Week: 4 days  . Minutes of Exercise per Session: 40 min  Stress:   . Feeling of Stress :   Social Connections:   . Frequency of Communication with Friends and Family:   . Frequency of Social Gatherings with Friends and Family:   . Attends Religious Services:   . Active Member of Clubs or Organizations:   . Attends Archivist Meetings:   Marland Kitchen Marital Status:    Tobacco Counseling Counseling given: Not Answered   Clinical Intake:  Pre-visit preparation completed: Yes  Pain : No/denies pain     Nutritional Status: BMI > 30  Obese Nutritional Risks: None Diabetes: Yes CBG done?: No Did pt. bring in CBG monitor from home?: No (virtual visit)  How often do you need to have someone help you when you read instructions, pamphlets, or other written materials from your doctor or pharmacy?: 1 - Never What is the last grade level you completed in school?: Associate's Degree  Nutrition Risk Assessment:  Has the patient had any N/V/D within the last 2 months?  No  Does the patient have any non-healing wounds?  No  Has the patient had any unintentional weight loss or weight gain?  No   Diabetes:  Is the patient diabetic?  Yes  If diabetic, was a CBG obtained today?  No  Did the patient bring in their glucometer from home?  No virtual visit How often do you monitor  your CBG's? daily.   Financial Strains and Diabetes Management:  Are you having any financial strains with the device, your supplies or your medication? No .  Does the patient want to be seen by Chronic Care Management for management of their diabetes?  No  Would the patient like to be referred to a Nutritionist or for Diabetic Management?  No   Diabetic Exams:  Diabetic Eye Exam: Completed -unsure of date. Overdue for diabetic eye exam. Pt has been advised about the importance in completing this exam.   Diabetic Foot Exam: Completed unsure of date. Pt has been advised about the importance in completing this exam.   Interpreter Needed?: No  Information entered by :: Caroleen Hamman LPN  Activities of Daily Living In your  present state of health, do you have any difficulty performing the following activities: 09/06/2019  Hearing? N  Vision? N  Difficulty concentrating or making decisions? N  Walking or climbing stairs? N  Dressing or bathing? N  Doing errands, shopping? N  Preparing Food and eating ? N  Using the Toilet? N  In the past six months, have you accidently leaked urine? N  Do you have problems with loss of bowel control? N  Managing your Medications? N  Managing your Finances? N  Housekeeping or managing your Housekeeping? N  Some recent data might be hidden     Immunizations and Health Maintenance Immunization History  Administered Date(s) Administered  . Influenza Whole 12/23/2018  . Influenza,inj,Quad PF,6+ Mos 01/23/2015  . Influenza-Unspecified 01/23/2015, 11/26/2015, 11/26/2015, 11/20/2016  . PFIZER SARS-COV-2 Vaccination 04/01/2019, 04/26/2019  . Pneumococcal Conjugate-13 02/14/2016, 02/14/2016  . Pneumococcal Polysaccharide-23 01/23/2015, 01/23/2015   Health Maintenance Due  Topic Date Due  . Hepatitis C Screening  Never done  . FOOT EXAM  Never done  . OPHTHALMOLOGY EXAM  Never done  . URINE MICROALBUMIN  Never done  . TETANUS/TDAP  Never done  .  COLONOSCOPY  Never done  . HEMOGLOBIN A1C  05/04/2013    Patient Care Team: Libby Maw, MD as PCP - General (Family Medicine) [name], Peter M, MD as PCP - Cardiology (Cardiology) Evans Lance, MD as PCP - Electrophysiology (Cardiology)  Indicate any recent Medical Services you may have received from other than [facility] providers in the past year (date may be approximate).    Assessment:   This is a routine wellness examination for Clyde.  Hearing/Vision screen  Hearing Screening   125Hz  250Hz  500Hz  1000Hz  2000Hz  3000Hz  4000Hz  6000Hz  8000Hz   Right ear:           Left ear:           Comments: No issues  Vision Screening Comments: Wears glasses- Last eye exam 2020  Dietary issues and exercise activities discussed: Current Exercise Habits: The patient does not participate in regular exercise at present;Home exercise routine, Type of exercise: strength training/weights;yoga, Time (Minutes): 40, Frequency (Times/Week): 4, Weekly Exercise (Minutes/Week): 160, Intensity: Mild  Goals Addressed            This Visit's Progress   . Patient Stated       Continue healthy eating plan      Depression Screen PHQ 2/9 Scores 09/06/2019  PHQ - 2 Score 0    Fall Risk Fall Risk  09/06/2019 05/01/2019 04/11/2015  Falls in the past year? 0 0 No  Number falls in past yr: 0 - -  Injury with Fall? 0 - -  Follow up Falls prevention discussed - -    FALL RISK PREVENTION PERTAINING TO THE HOME:  Any stairs in or around the home? Yes  If so, are there any without handrails? No   Home free of loose throw rugs in walkways, pet beds, electrical cords, etc? Yes Adequate lighting in your home to reduce risk of falls? Yes   ASSISTIVE DEVICES UTILIZED TO PREVENT FALLS:  Life alert? No  Use of a cane, walker or w/c? No  Grab bars in the bathroom? Yes  Shower chair or bench in shower? No  Elevated toilet seat or a handicapped toilet? No    TIMED UP AND GO:  Was the test performed?  No . Virtual visit     Cognitive Function:No cognitive impairment noted  Screening Tests Health Maintenance  Topic Date Due  . Hepatitis C Screening  Never done  . FOOT EXAM  Never done  . OPHTHALMOLOGY EXAM  Never done  . URINE MICROALBUMIN  Never done  . TETANUS/TDAP  Never done  . COLONOSCOPY  Never done  . HEMOGLOBIN A1C  05/04/2013  . INFLUENZA VACCINE  09/24/2019  . COVID-19 Vaccine  Completed  . PNA vac Low Risk Adult  Completed    Qualifies for Shingles Vaccine? Yes  Zostavax completed unsure. Due for Shingrix. Education has been provided regarding the importance of this vaccine. Pt has been advised to call insurance company to determine out of pocket expense. Advised may also receive vaccine at local pharmacy or Health Dept. Verbalized acceptance and understanding.  Tdap: Although this vaccine is not a covered service during a Wellness Exam, does the patient still wish to receive this vaccine today?  No . Virtual visit Education has been provided regarding the importance of this vaccine. Advised may receive this vaccine at local pharmacy or Health Dept. Aware to provide a copy of the vaccination record if obtained from local pharmacy or Health Dept. Verbalized acceptance and understanding.  Flu Vaccine: Due 10/2019  Pneumococcal Vaccine:  Up to Date  Covid-19:Completed vaccines   Cancer Screenings:  Colorectal Screening: Completed 2013 per patient. Repeat every 10 years  Lung Cancer Screening: (Low Dose CT Chest recommended if Age 82-80 years, 30 pack-year currently smoking OR have quit w/in 15years.) does not qualify.     Additional Screening:  Hepatitis C Screening: does qualify; Discuss with PCP  Vision Screening: Recommended annual ophthalmology exams for early detection of glaucoma and other disorders of the eye. Is the patient up to date with their annual eye exam?  Yes  Who is the provider or what is the name of the office in which the pt attends  annual eye exams? Unsure of name   Dental Screening: Recommended annual dental exams for proper oral hygiene  Community Resource Referral:  CRR required this visit?  No        Plan:  I have personally reviewed and addressed the Medicare Annual Wellness questionnaire and have noted the following in the patient's chart:  A. Medical and social history B. Use of alcohol, tobacco or illicit drugs  C. Current medications and supplements D. Functional ability and status E.  Nutritional status F.  Physical activity G. Advance directives H. List of other physicians I.  Hospitalizations, surgeries, and ER visits in previous 12 months J.  [place] such as hearing and vision if needed, cognitive and depression L. Referrals and appointments   In addition, I have reviewed and discussed with patient certain preventive protocols, quality metrics, and best practice recommendations. A written personalized care plan for preventive services as well as general preventive health recommendations were provided to patient.   Due to this being a telephonic visit, the after visit summary with patients personalized plan was offered to patient via mail or my-chart.  Patient would like to access on my-chart.  Signed,    Marta Antu, LPN   11/07/7827  Nurse Health Advisor    Nurse Notes:

## 2019-09-05 NOTE — Telephone Encounter (Signed)
Spoke to patient Dr.Jordan's recommendation given.He will call back if B/P elevated.

## 2019-09-05 NOTE — Telephone Encounter (Signed)
Pt c/o medication issue:  1. Name of Medication: losartan (COZAAR) 25 MG tablet  2. How are you currently taking this medication (dosage and times per day)? One 25 MG tablet by mouth daily   3. Are you having a reaction (difficulty breathing--STAT)? Yes   4. What is your medication issue? Makena is calling wanting to know if he can stop taking this medication for a couple of days to see if it is causing him side effects. He states he has been experencing lightheadedness, feeling off balance, swelling in his left foot every once in awhile, a dry rash on legs, and numbness in feet. Please advise.

## 2019-09-06 ENCOUNTER — Telehealth: Payer: Self-pay | Admitting: Cardiology

## 2019-09-06 ENCOUNTER — Ambulatory Visit (INDEPENDENT_AMBULATORY_CARE_PROVIDER_SITE_OTHER): Payer: Medicare Other

## 2019-09-06 VITALS — Ht 75.0 in | Wt 276.0 lb

## 2019-09-06 DIAGNOSIS — Z0000 Encounter for general adult medical examination without abnormal findings: Secondary | ICD-10-CM

## 2019-09-06 NOTE — Telephone Encounter (Signed)
Returned call to pt informed that the monitor should be applied by one of [place]. Verbalizes understanding. He states that he will be her before noon tomorrow.

## 2019-09-06 NOTE — Telephone Encounter (Signed)
He states that he did receive the monitor and he thinks that he can put it on. He will call back if he needs help

## 2019-09-06 NOTE — Telephone Encounter (Signed)
Called back pt he states that he will arrange his transportation to be before 11am and will call back and let us know what time he will be coming.

## 2019-09-06 NOTE — Telephone Encounter (Signed)
Michael Whitney is calling stating [name] advised him to call once he receives his monitor, and he got it yesterday. Please advise.

## 2019-09-06 NOTE — Telephone Encounter (Signed)
Pt called in and states that he is having swelling in his LE. He states that he does not know what this is from. He states that his BP today 164/70 HR 74. He denies any chest pain or SOB. He took all his medications today including his losartan. He does also has a rash on his bilat LE also. He describes this rash as red and itching. He states that he has spoke with his PCp and states that they do not know what this is from. Pt is coming in tomorrow to have monitor he would like to show the nurse at that time. He will keep his legs elevated, rest and will keep an eye on his BP and swelling. He will go to the ER if sx worsen or new sx develop, verbalizes understanding.

## 2019-09-06 NOTE — Telephone Encounter (Signed)
PT CALLED BACK HE WILL BE HER AT 10AM TOMORROW

## 2019-09-06 NOTE — Patient Instructions (Signed)
[name] , Thank you for taking time to come for your Medicare Wellness Visit. I appreciate your ongoing commitment to your health goals. Please review the following plan we discussed and let me know if I can assist you in the future.   Screening recommendations/referrals: Colonoscopy: Completed 2013 per our discussion today-Due 2023 Recommended yearly ophthalmology/optometry visit for glaucoma screening and checkup Recommended yearly dental visit for hygiene and checkup  Vaccinations: Influenza vaccine: Due 10/2019 Pneumococcal vaccine: Completed vaccines Tdap vaccine: Discuss with pharmacy Shingles vaccine: Discuss with pharmacy  Covid-19: Completed vaccines  Advanced directives: Bring a copy to next office visit  Conditions/risks identified: See problem list  Next appointment: Follow up in one year for your annual wellness visit.   Preventive Care 80 Years and Older, Male Preventive care refers to lifestyle choices and visits with your health care provider that can promote health and wellness. What does preventive care include?  A yearly physical exam. This is also called an annual well check.  Dental exams once or twice a year.  Routine eye exams. Ask your health care provider how often you should have your eyes checked.  Personal lifestyle choices, including:  Daily care of your teeth and gums.  Regular physical activity.  Eating a healthy diet.  Avoiding tobacco and drug use.  Limiting alcohol use.  Practicing safe sex.  Taking low doses of aspirin every day.  Taking vitamin and mineral supplements as recommended by your health care provider. What happens during an annual well check? The services and screenings done by your health care provider during your annual well check will depend on your age, overall health, lifestyle risk factors, and family history of disease. Counseling  Your health care provider may ask you questions about your:  Alcohol  use.  Tobacco use.  Drug use.  Emotional well-being.  Home and relationship well-being.  Sexual activity.  Eating habits.  History of falls.  Memory and ability to understand (cognition).  Work and work Statistician. Screening  You may have the following tests or measurements:  Height, weight, and BMI.  Blood pressure.  Lipid and cholesterol levels. These may be checked every 5 years, or more frequently if you are over 77 years old.  Skin check.  Lung cancer screening. You may have this screening every year starting at age 68 if you have a 30-pack-year history of smoking and currently smoke or have quit within the past 15 years.  Fecal occult blood test (FOBT) of the stool. You may have this test every year starting at age 57.  Flexible sigmoidoscopy or colonoscopy. You may have a sigmoidoscopy every 5 years or a colonoscopy every 10 years starting at age 67.  Prostate cancer screening. Recommendations will vary depending on your family history and other risks.  Hepatitis C blood test.  Hepatitis B blood test.  Sexually transmitted disease (STD) testing.  Diabetes screening. This is done by checking your blood sugar (glucose) after you have not eaten for a while (fasting). You may have this done every 1-3 years.  Abdominal aortic aneurysm (AAA) screening. You may need this if you are a current or former smoker.  Osteoporosis. You may be screened starting at age 31 if you are at high risk. Talk with your health care provider about your test results, treatment options, and if necessary, the need for more tests. Vaccines  Your health care provider may recommend certain vaccines, such as:  Influenza vaccine. This is recommended every year.  Tetanus, diphtheria, and  acellular pertussis (Tdap, Td) vaccine. You may need a Td booster every 10 years.  Zoster vaccine. You may need this after age 72.  Pneumococcal 13-valent conjugate (PCV13) vaccine. One dose is  recommended after age 48.  Pneumococcal polysaccharide (PPSV23) vaccine. One dose is recommended after age 17. Talk to your health care provider about which screenings and vaccines you need and how often you need them. This information is not intended to replace advice given to you by your health care provider. Make sure you discuss any questions you have with your health care provider. Document Released: 03/08/2015 Document Revised: 10/30/2015 Document Reviewed: 12/11/2014 Elsevier Interactive Patient Education  2017 [place] Prevention in the Home Falls can cause injuries. They can happen to people of all ages. There are many things you can do to make your home safe and to help prevent falls. What can I do on the outside of my home?  Regularly fix the edges of walkways and driveways and fix any cracks.  Remove anything that might make you trip as you walk through a door, such as a raised step or threshold.  Trim any bushes or trees on the path to your home.  Use bright outdoor lighting.  Clear any walking paths of anything that might make someone trip, such as rocks or tools.  Regularly check to see if handrails are loose or broken. Make sure that both sides of any steps have handrails.  Any raised decks and porches should have guardrails on the edges.  Have any leaves, snow, or ice cleared regularly.  Use sand or salt on walking paths during winter.  Clean up any spills in your garage right away. This includes oil or grease spills. What can I do in the bathroom?  Use night lights.  Install grab bars by the toilet and in the tub and shower. Do not use towel bars as grab bars.  Use non-skid mats or decals in the tub or shower.  If you need to sit down in the shower, use a plastic, non-slip stool.  Keep the floor dry. Clean up any water that spills on the floor as soon as it happens.  Remove soap buildup in the tub or shower regularly.  Attach bath mats  securely with double-sided non-slip rug tape.  Do not have throw rugs and other things on the floor that can make you trip. What can I do in the bedroom?  Use night lights.  Make sure that you have a light by your bed that is easy to reach.  Do not use any sheets or blankets that are too big for your bed. They should not hang down onto the floor.  Have a firm chair that has side arms. You can use this for support while you get dressed.  Do not have throw rugs and other things on the floor that can make you trip. What can I do in the kitchen?  Clean up any spills right away.  Avoid walking on wet floors.  Keep items that you use a lot in easy-to-reach places.  If you need to reach something above you, use a strong step stool that has a grab bar.  Keep electrical cords out of the way.  Do not use floor polish or wax that makes floors slippery. If you must use wax, use non-skid floor wax.  Do not have throw rugs and other things on the floor that can make you trip. What can I do with my stairs?  Do not leave any items on the stairs.  Make sure that there are handrails on both sides of the stairs and use them. Fix handrails that are broken or loose. Make sure that handrails are as long as the stairways.  Check any carpeting to make sure that it is firmly attached to the stairs. Fix any carpet that is loose or worn.  Avoid having throw rugs at the top or bottom of the stairs. If you do have throw rugs, attach them to the floor with carpet tape.  Make sure that you have a light switch at the top of the stairs and the bottom of the stairs. If you do not have them, ask someone to add them for you. What else can I do to help prevent falls?  Wear shoes that:  Do not have high heels.  Have rubber bottoms.  Are comfortable and fit you well.  Are closed at the toe. Do not wear sandals.  If you use a stepladder:  Make sure that it is fully opened. Do not climb a closed  stepladder.  Make sure that both sides of the stepladder are locked into place.  Ask someone to hold it for you, if possible.  Clearly mark and make sure that you can see:  Any grab bars or handrails.  First and last steps.  Where the edge of each step is.  Use tools that help you move around (mobility aids) if they are needed. These include:  Canes.  Walkers.  Scooters.  Crutches.  Turn on the lights when you go into a dark area. Replace any light bulbs as soon as they burn out.  Set up your furniture so you have a clear path. Avoid moving your furniture around.  If any of your floors are uneven, fix them.  If there are any pets around you, be aware of where they are.  Review your medicines with your doctor. Some medicines can make you feel dizzy. This can increase your chance of falling. Ask your doctor what other things that you can do to help prevent falls. This information is not intended to replace advice given to you by your health care provider. Make sure you discuss any questions you have with your health care provider. Document Released: 12/06/2008 Document Revised: 07/18/2015 Document Reviewed: 03/16/2014 Elsevier Interactive Patient Education  2017 [employer].

## 2019-09-06 NOTE — Telephone Encounter (Signed)
Pt c/o swelling: STAT is pt has developed SOB within 24 hours  1) How much weight have you gained and in what time span? N/A  2) If swelling, where is the swelling located? Both ankles and feet  3) Are you currently taking a fluid pill? No  4) Are you currently SOB? No  5) Do you have a log of your daily weights (if so, list)? no  6) Have you gained 3 pounds in a day or 5 pounds in a week? N/A  7) Have you traveled recently? No

## 2019-09-06 NOTE — Telephone Encounter (Signed)
Spoke with the patient who states he stopped taking his Losartan--see phone note from 7/13--Dr. [name] has approved this as long as patients BP is under control. His BP this morning was 130/67. I advised the patient to continue closely monitoring his BP and call us back with any concerns.   He states that he received his heart monitor in the mail last night and was wondering if he should go ahead and put it on. I advised the patient that he could go ahead and apply the monitor. He has the monitor company's help line phone number to call if he needs further assistance.

## 2019-09-07 ENCOUNTER — Telehealth: Payer: Self-pay | Admitting: Cardiology

## 2019-09-07 ENCOUNTER — Ambulatory Visit (INDEPENDENT_AMBULATORY_CARE_PROVIDER_SITE_OTHER): Payer: Medicare Other

## 2019-09-07 ENCOUNTER — Other Ambulatory Visit: Payer: Self-pay

## 2019-09-07 DIAGNOSIS — R42 Dizziness and giddiness: Secondary | ICD-10-CM

## 2019-09-07 DIAGNOSIS — R002 Palpitations: Secondary | ICD-10-CM

## 2019-09-07 NOTE — Telephone Encounter (Signed)
    Michael Whitney from [facility] reporting abnormal EKG

## 2019-09-07 NOTE — Telephone Encounter (Signed)
Pt had first documented episode of afib at a rate of 53-76  Average  Is 60 [employer] called pt and pt feels fine .Adonis Housekeeper

## 2019-09-07 NOTE — Progress Notes (Signed)
Pt in today to have monitor placed as previous would not stay on and reading were all artifact. [employer] AT monitor placed according to direction and extra tap appled to additional security.   During visit I talked with patient about his diet as he state he is trying to loose weight so he is only eating a little during the day and he is taking his medication with little food. Educated on importance of having something on his stomach when eathing like some Peanut Butter toast, oatmeal, etc as taking his medication on empty stomach is not helping is situation.  Patient agreed no additional questions at this time.

## 2019-09-09 ENCOUNTER — Emergency Department (HOSPITAL_BASED_OUTPATIENT_CLINIC_OR_DEPARTMENT_OTHER): Payer: Medicare Other

## 2019-09-09 ENCOUNTER — Emergency Department (HOSPITAL_BASED_OUTPATIENT_CLINIC_OR_DEPARTMENT_OTHER)
Admission: EM | Admit: 2019-09-09 | Discharge: 2019-09-09 | Disposition: A | Discharge status: Home / Self Care | Payer: Medicare Other | Source: Home / Self Care | Attending: Emergency Medicine | Admitting: Emergency Medicine

## 2019-09-09 ENCOUNTER — Encounter (HOSPITAL_BASED_OUTPATIENT_CLINIC_OR_DEPARTMENT_OTHER): Payer: Self-pay

## 2019-09-09 ENCOUNTER — Emergency Department (HOSPITAL_COMMUNITY)
Admission: EM | Admit: 2019-09-09 | Discharge: 2019-09-09 | Disposition: A | Discharge status: Home / Self Care | Payer: Medicare Other | Attending: Emergency Medicine | Admitting: Emergency Medicine

## 2019-09-09 ENCOUNTER — Other Ambulatory Visit: Payer: Self-pay

## 2019-09-09 DIAGNOSIS — R42 Dizziness and giddiness: Secondary | ICD-10-CM | POA: Insufficient documentation

## 2019-09-09 DIAGNOSIS — R202 Paresthesia of skin: Secondary | ICD-10-CM | POA: Insufficient documentation

## 2019-09-09 DIAGNOSIS — D696 Thrombocytopenia, unspecified: Secondary | ICD-10-CM | POA: Insufficient documentation

## 2019-09-09 DIAGNOSIS — E119 Type 2 diabetes mellitus without complications: Secondary | ICD-10-CM | POA: Insufficient documentation

## 2019-09-09 DIAGNOSIS — I10 Essential (primary) hypertension: Secondary | ICD-10-CM | POA: Insufficient documentation

## 2019-09-09 DIAGNOSIS — Z79899 Other long term (current) drug therapy: Secondary | ICD-10-CM | POA: Insufficient documentation

## 2019-09-09 DIAGNOSIS — J449 Chronic obstructive pulmonary disease, unspecified: Secondary | ICD-10-CM | POA: Insufficient documentation

## 2019-09-09 DIAGNOSIS — R600 Localized edema: Secondary | ICD-10-CM | POA: Insufficient documentation

## 2019-09-09 DIAGNOSIS — R791 Abnormal coagulation profile: Secondary | ICD-10-CM | POA: Insufficient documentation

## 2019-09-09 DIAGNOSIS — Z7984 Long term (current) use of oral hypoglycemic drugs: Secondary | ICD-10-CM | POA: Insufficient documentation

## 2019-09-09 DIAGNOSIS — Z7901 Long term (current) use of anticoagulants: Secondary | ICD-10-CM | POA: Insufficient documentation

## 2019-09-09 HISTORY — DX: Type 2 diabetes mellitus without complications: E11.9

## 2019-09-09 LAB — COMPREHENSIVE METABOLIC PANEL
ALT: 24 U/L (ref 0–44)
AST: 22 U/L (ref 15–41)
Albumin: 4.1 g/dL (ref 3.5–5.0)
Alkaline Phosphatase: 49 U/L (ref 38–126)
Anion gap: 9 (ref 5–15)
BUN: 23 mg/dL (ref 8–23)
CO2: 25 mmol/L (ref 22–32)
Calcium: 8.8 mg/dL — ABNORMAL LOW (ref 8.9–10.3)
Chloride: 103 mmol/L (ref 98–111)
Creatinine, Ser: 1.07 mg/dL (ref 0.61–1.24)
GFR calc Af Amer: >60 mL/min (ref >60)
GFR calc non Af Amer: >60 mL/min (ref >60)
Glucose, Bld: 97 mg/dL (ref 70–99)
Potassium: 4.8 mmol/L (ref 3.5–5.1)
Sodium: 137 mmol/L (ref 135–145)
Total Bilirubin: 0.7 mg/dL (ref 0.3–1.2)
Total Protein: 6.8 g/dL (ref 6.5–8.1)

## 2019-09-09 LAB — DIFFERENTIAL
Abs Immature Granulocytes: 0.02 10*3/uL (ref 0.00–0.07)
Basophils Absolute: 0.1 10*3/uL (ref 0.0–0.1)
Basophils Relative: 1 %
Eosinophils Absolute: 0.1 10*3/uL (ref 0.0–0.5)
Eosinophils Relative: 1 %
Immature Granulocytes: 0 %
Lymphocytes Relative: 21 %
Lymphs Abs: 1.3 10*3/uL (ref 0.7–4.0)
Monocytes Absolute: 0.6 10*3/uL (ref 0.1–1.0)
Monocytes Relative: 9 %
Neutro Abs: 4.4 10*3/uL (ref 1.7–7.7)
Neutrophils Relative %: 68 %

## 2019-09-09 LAB — CBC
HCT: 47.9 % (ref 39.0–52.0)
Hemoglobin: 16.1 g/dL (ref 13.0–17.0)
MCH: 33.3 pg (ref 26.0–34.0)
MCHC: 33.6 g/dL (ref 30.0–36.0)
MCV: 99 fL (ref 80.0–100.0)
Platelets: 147 10*3/uL — ABNORMAL LOW (ref 150–400)
RBC: 4.84 MIL/uL (ref 4.22–5.81)
RDW: 13.1 % (ref 11.5–15.5)
WBC: 6.4 10*3/uL (ref 4.0–10.5)
nRBC: 0 % (ref 0.0–0.2)

## 2019-09-09 LAB — APTT: aPTT: 34 seconds (ref 24–36)

## 2019-09-09 LAB — PROTIME-INR
INR: 1.2 (ref 0.8–1.2)
Prothrombin Time: 14.6 seconds (ref 11.4–15.2)

## 2019-09-09 MED ORDER — IOHEXOL 350 MG/ML SOLN
100.0000 mL | Freq: Once | INTRAVENOUS | Status: AC | PRN
  Administered 2019-09-09: 100 mL via INTRAVENOUS
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
## 2019-09-09 NOTE — ED Notes (Signed)
Pt wants his thyroid levels checked because he believes that is what is making him feel bad

## 2019-09-09 NOTE — ED Notes (Signed)
Patient transported to CT 

## 2019-09-09 NOTE — ED Triage Notes (Signed)
Pt c/o numbness to the L side of face that was present when he woke up this AM. Pt last known normal was bedtime at 22:00 last PM. Pt reports he has been dealing with dizziness, and L sided weakness "at least this year." Pt also reports swelling in his ankles.

## 2019-09-09 NOTE — ED Notes (Signed)
Tech called patient name in the lobby and outside for triage and no one responded

## 2019-09-10 LAB — T4, FREE: Free T4: 0.73 ng/dL (ref 0.61–1.12)

## 2019-09-10 LAB — TSH: TSH: 0.499 u[IU]/mL (ref 0.350–4.500)

## 2019-09-15 ENCOUNTER — Telehealth: Payer: Self-pay | Admitting: Cardiology

## 2019-09-15 NOTE — Telephone Encounter (Signed)
Called and spoke with pt, he reports that he has had the monitor on for 9-10 days and he wore one prior to this and it wasn't working. He wanted to make sure this one was working. I notified that usually if the monitor isn't working or if he had taken it off we would get a message from the monitor company. Notified that to my knowledge we had not received a message like this.  Notified that his monitor is supposed to be a 14 day monitor. Pt verbalized understanding and stated he would continue to wear it. No other questions at this time.

## 2019-09-15 NOTE — Telephone Encounter (Signed)
Patient is calling to inquire about whether or not he can remove his monitor for the weekend. Please call to discuss.

## 2019-09-16 NOTE — Progress Notes (Deleted)
Cardiology Office Note:    Date:  09/16/2019   ID:  Michael Whitney, DOB May 13, 1950, MRN [id number]  PCP:  Provider, Generic External Data  [facility] Cardiologist:  Korynne Dols [name], MD  [facility] Electrophysiologist:  Cristopher Peru, MD   Referring MD: Libby Maw   No chief complaint on file.   History of Present Illness:    Michael Whitney is a 69 y.o. male with a hx of permanent atrial fibrillation, hypertension, hyperthyroidism/Hashimoto thyroiditis and history of seizure. Event monitor obtained in 2014 was unremarkable. He only ever had one seizure episode, he was initially on Keppra, this was later discontinued. In January 2019, he was diagnosed with atrial fibrillation with controlled heart rate after he presented with lightheadedness, fatigue, dizziness and a 50 pound weight gain. The duration of atrial fibrillation was unknown. He was started on anticoagulation therapy. We pursued a rate control and anticoagulation strategy. Due to significant recurrent dizzy spell and fatigue, Holter monitor was placed in 2019 that showed atrial fibrillation with slow ventricular rate. He also had high PVC burden. He underwent EP study by Dr. Lovena Le which revealed that his PVCs were arising near his His bundle. Ablation was not recommended due to concern of potential heart block. He was prescribed mexiletine. 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 He was seen in November 2020, at which time he mentioned he was admitted to [facility] in October 2020. Echocardiogram wasokay but showed moderate MR. We felt majority of his symptoms at the time was related to thyroiditis.  Patient underwent radioactive iodine treatment and was placed on methimazole earlier this year due to persistent hyperthyroidism. He was seen here in April 12, 2019, he  apparently went to the [facility] ED the day prior with flushing sensation and dizziness.  I suspect that he had a vasovagal episode.  High-sensitivity troponin was initially 312, this decreased to 269 upon repeat lab work.  MRI of the brain was negative for stroke.  He was instructed to follow-up with cardiology service.  Due to lower extremity tingling sensation, ABI was obtained which was normal.  Given the elevated troponin, Myoview performed on 04/27/2019 showed mild thinning of the inferior wall consistent with soft tissue attenuation, no ischemia was noted, echocardiogram was unchanged from prior studies. Event monitor was ordered.    Patient presents today for cardiology office visit.  He continued to have significant dizziness.  He has also experienced some recent palpitation as well along with dyspnea.  I recommended a heart monitor and repeat echocardiogram.  Otherwise he denies any significant chest discomfort.  Past Medical History:  Diagnosis Date  . Atrial fibrillation ([place])   . Cataract   . Diabetes mellitus without complication ([place])   . Hypertension   . Seizures ([place])     Past Surgical History:  Procedure Laterality Date  . CATARACT EXTRACTION    . PVC ABLATION N/A 07/14/2017   Procedure: PVC ABLATION;  Surgeon: Evans Lance, MD;  Location: [place] CV LAB;  Service: Cardiovascular;  Laterality: N/A;  . TONSILLECTOMY    . VARICOSE VEIN SURGERY      Current Medications: No outpatient medications have been marked as taking for the 09/20/19 encounter (Appointment) with [name], Sophira Rumler M, MD.     Allergies:   Patient has no known allergies.   Social History   Socioeconomic History  . Marital  status: Widowed    Spouse name: Not on file  . Number of children: 2  . Years of education: Not on file  . Highest education level: Not on file  Occupational History  . Occupation: retired    Comment: Geophysicist/field seismologist  Tobacco Use  . Smoking status: Never Smoker  . Smokeless  tobacco: Never Used  Vaping Use  . Vaping Use: Never used  Substance and Sexual Activity  . Alcohol use: Yes    Alcohol/week: 2.0 standard drinks    Types: 1 Shots of liquor, 1 Cans of beer per week    Comment: occasionally  . Drug use: No  . Sexual activity: Not on file  Other Topics Concern  . Not on file  Social History Narrative  . Not on file   Social Determinants of Health   Financial Resource Strain:   . Difficulty of Paying Living Expenses:   Food Insecurity:   . Worried About Charity fundraiser in the Last Year:   . Arboriculturist in the Last Year:   Transportation Needs:   . Film/video editor (Medical):   Marland Kitchen Lack of Transportation (Non-Medical):   Physical Activity: Sufficiently Active  . Days of Exercise per Week: 4 days  . Minutes of Exercise per Session: 40 min  Stress:   . Feeling of Stress :   Social Connections:   . Frequency of Communication with Friends and Family:   . Frequency of Social Gatherings with Friends and Family:   . Attends Religious Services:   . Active Member of Clubs or Organizations:   . Attends Archivist Meetings:   Marland Kitchen Marital Status:      Family History: The patient's family history includes CAD in his mother.  ROS:   Please see the history of present illness.     All other systems reviewed and are negative.  EKGs/Labs/Other Studies Reviewed:    The following studies were reviewed today:  Myoview 04/27/2019  Lexiscan stress without ST changes to suggest ischemia  Mild thinning in the inferior wall (base, mid) Consistent with soft tissue attenuation (diaphragm), cannot exclude scar as images were not gated due to atrial fibrillation.. No ischemia noted.  Would recomm echo to further define wall motion, particularly in the inferior wall.    Echo 08/29/19: IMPRESSIONS    1. Left ventricular ejection fraction, by estimation, is 55 to 60%. The  left ventricle has normal function. The left ventricle has no  regional  wall motion abnormalities. There is mild concentric left ventricular  hypertrophy. Left ventricular diastolic  function could not be evaluated.  2. Right ventricular systolic function is mildly reduced. The right  ventricular size is normal.  3. Left atrial size was moderately dilated.  4. The mitral valve is normal in structure. Trivial mitral valve  regurgitation.  5. The aortic valve is tricuspid. Aortic valve regurgitation is mild.  Mild aortic valve stenosis.  6. The inferior vena cava is normal in size with greater than 50%  respiratory variability, suggesting right atrial pressure of 3 mmHg.   Comparison(s): No significant change from prior study.  EKG:  EKG is ordered today.  The ekg ordered today demonstrates atrial fibrillation, heart rate 82, single PVC, right bundle branch block.  Recent Labs: 09/09/2019: ALT 24; BUN 23; Creatinine, Ser 1.07; Hemoglobin 16.1; Platelets 147; Potassium 4.8; Sodium 137; TSH 0.499  Recent Lipid Panel    Component Value Date/Time   CHOL 111 11/04/2012 1125   TRIG  104 11/04/2012 1125   HDL 35 (L) 11/04/2012 1125   CHOLHDL 3.2 11/04/2012 1125   VLDL 21 11/04/2012 1125   LDLCALC 55 11/04/2012 1125    Physical Exam:    VS:  There were no vitals taken for this visit.    Wt Readings from Last 3 Encounters:  09/09/19 275 lb (124.7 kg)  09/06/19 276 lb (125.2 kg)  08/16/19 279 lb (126.6 kg)     GEN:  Well nourished, well developed in no acute distress HEENT: Normal NECK: No JVD; No carotid bruits LYMPHATICS: No lymphadenopathy CARDIAC: RRR, no murmurs, rubs, gallops RESPIRATORY:  Clear to auscultation without rales, wheezing or rhonchi  ABDOMEN: Soft, non-tender, non-distended MUSCULOSKELETAL:  No edema; No deformity  SKIN: Warm and dry NEUROLOGIC:  Alert and oriented x 3 PSYCHIATRIC:  Normal affect   ASSESSMENT:    No diagnosis found. PLAN:    In order of problems listed above:  1. Palpitation: Obtain 2-week  heart monitor.  2. Dyspnea: Echo was unchanged from prior.  Recent Myoview showed no significant ischemia.  3. Permanent atrial fibrillation: On Eliquis.  Self rate controlled.  4. Hypertension: Blood pressure well controlled  5. Hashimoto's thyroiditis: He underwent iodine treatment earlier this year.   Medication Adjustments/Labs and Tests Ordered: Current medicines are reviewed at length with the patient today.  Concerns regarding medicines are outlined above.  No orders of the defined types were placed in this encounter.  No orders of the defined types were placed in this encounter.   There are no Patient Instructions on file for this visit.   Signed, Isabell Bonafede [name], MD  09/16/2019 12:16 PM    [place]

## 2019-09-17 ENCOUNTER — Other Ambulatory Visit: Payer: Self-pay

## 2019-09-17 ENCOUNTER — Emergency Department (HOSPITAL_BASED_OUTPATIENT_CLINIC_OR_DEPARTMENT_OTHER)
Admission: EM | Admit: 2019-09-17 | Discharge: 2019-09-17 | Disposition: A | Discharge status: Home / Self Care | Payer: Medicare Other | Attending: Emergency Medicine | Admitting: Emergency Medicine

## 2019-09-17 ENCOUNTER — Encounter (HOSPITAL_BASED_OUTPATIENT_CLINIC_OR_DEPARTMENT_OTHER): Payer: Self-pay | Admitting: Emergency Medicine

## 2019-09-17 DIAGNOSIS — R2681 Unsteadiness on feet: Secondary | ICD-10-CM | POA: Diagnosis not present

## 2019-09-17 DIAGNOSIS — Z7984 Long term (current) use of oral hypoglycemic drugs: Secondary | ICD-10-CM | POA: Diagnosis not present

## 2019-09-17 DIAGNOSIS — R61 Generalized hyperhidrosis: Secondary | ICD-10-CM | POA: Insufficient documentation

## 2019-09-17 DIAGNOSIS — J449 Chronic obstructive pulmonary disease, unspecified: Secondary | ICD-10-CM | POA: Insufficient documentation

## 2019-09-17 DIAGNOSIS — E119 Type 2 diabetes mellitus without complications: Secondary | ICD-10-CM | POA: Diagnosis not present

## 2019-09-17 DIAGNOSIS — R519 Headache, unspecified: Secondary | ICD-10-CM | POA: Insufficient documentation

## 2019-09-17 DIAGNOSIS — I10 Essential (primary) hypertension: Secondary | ICD-10-CM | POA: Diagnosis not present

## 2019-09-17 DIAGNOSIS — R42 Dizziness and giddiness: Secondary | ICD-10-CM | POA: Insufficient documentation

## 2019-09-17 LAB — CBC
HCT: 52.8 % — ABNORMAL HIGH (ref 39.0–52.0)
Hemoglobin: 17.5 g/dL — ABNORMAL HIGH (ref 13.0–17.0)
MCH: 32.5 pg (ref 26.0–34.0)
MCHC: 33.1 g/dL (ref 30.0–36.0)
MCV: 98.1 fL (ref 80.0–100.0)
Platelets: 150 10*3/uL (ref 150–400)
RBC: 5.38 MIL/uL (ref 4.22–5.81)
RDW: 13.1 % (ref 11.5–15.5)
WBC: 6.4 10*3/uL (ref 4.0–10.5)
nRBC: 0 % (ref 0.0–0.2)

## 2019-09-17 LAB — CBG MONITORING, ED: Glucose-Capillary: 155 mg/dL — ABNORMAL HIGH (ref 70–99)

## 2019-09-17 LAB — BASIC METABOLIC PANEL
Anion gap: 13 (ref 5–15)
BUN: 23 mg/dL (ref 8–23)
CO2: 24 mmol/L (ref 22–32)
Calcium: 9.5 mg/dL (ref 8.9–10.3)
Chloride: 99 mmol/L (ref 98–111)
Creatinine, Ser: 1.08 mg/dL (ref 0.61–1.24)
GFR calc Af Amer: >60 mL/min (ref >60)
GFR calc non Af Amer: >60 mL/min (ref >60)
Glucose, Bld: 141 mg/dL — ABNORMAL HIGH (ref 70–99)
Potassium: 4.5 mmol/L (ref 3.5–5.1)
Sodium: 136 mmol/L (ref 135–145)

## 2019-09-17 MED ORDER — PROCHLORPERAZINE EDISYLATE 10 MG/2ML IJ SOLN: 5.0000 mg | Freq: Once | INTRAMUSCULAR | Status: DC

## 2019-09-17 MED ORDER — DEXAMETHASONE 6 MG PO TABS
10.0000 mg | ORAL_TABLET | Freq: Once | ORAL | Status: AC
  Administered 2019-09-17: 10 mg via ORAL
  Filled 2019-09-17: qty 1

## 2019-09-17 MED ORDER — DIPHENHYDRAMINE HCL 50 MG/ML IJ SOLN
12.5000 mg | Freq: Once | INTRAMUSCULAR | Status: AC
  Administered 2019-09-17: 12.5 mg via INTRAMUSCULAR
  Filled 2019-09-17: qty 1

## 2019-09-17 MED ORDER — PROCHLORPERAZINE EDISYLATE 10 MG/2ML IJ SOLN
5.0000 mg | Freq: Once | INTRAMUSCULAR | Status: AC
  Administered 2019-09-17: 5 mg via INTRAMUSCULAR
  Filled 2019-09-17: qty 2

## 2019-09-17 NOTE — Discharge Instructions (Signed)
Please call your doctor on Monday and let them know that you were seen in the emergency department and that you think it may be due to your nortriptyline.  Please return for inability to walk or if you notice that you are having weakness on one side difficulty with speech or swallowing.

## 2019-09-17 NOTE — ED Notes (Signed)
Pt discharged to home. Discharge instructions have been discussed with patient and/or family members. Pt verbally acknowledges understanding d/c instructions, and endorses comprehension to checkout at registration before leaving.  °

## 2019-09-17 NOTE — ED Notes (Signed)
ED Provider Tyrone Nine at bedside.

## 2019-09-17 NOTE — ED Triage Notes (Signed)
Pt here after taking nortriptyline for 3 days and is now dizzy, disoriented and hypertensive.

## 2019-09-17 NOTE — ED Provider Notes (Signed)
[place] EMERGENCY DEPARTMENT Provider Note   CSN: [id number] Arrival date & time: 09/17/19  1352     History Chief Complaint  Patient presents with  . Medication Reaction    Michael Whitney is a 69 y.o. male.  69 yo M with a chief complaints of persistent unsteadiness.  This is been going on for quite some time.  He thinks he seen at least 3 ENTs and neurologist is a cardiologist for the same.  He has had stress test and echo a Holter monitor has had 3 MRIs.  States that no one has found the exact etiology of his symptoms.  He recently saw Dr. Who thought it may be due to migraine headaches and was started on nortriptyline.  Since then he feels the symptoms of gotten much worse.  Going on for the past 3 days.  Worse with head movement or standing.  He has a slight left-sided headache.  Has some chronic neck pain off and on.  Denies one-sided numbness or weakness denies difficulty speech or swallowing.  This is been going on since at least 2019 though I do see visits in the computer from 2014 for dizziness.  The history is provided by the patient and a friend.  Illness Severity:  Moderate Onset quality:  Gradual Duration:  24 months Timing:  Constant Progression:  Worsening Chronicity:  Chronic Associated symptoms: headaches   Associated symptoms: no abdominal pain, no chest pain, no congestion, no diarrhea, no fever, no myalgias, no rash, no shortness of breath and no vomiting        Past Medical History:  Diagnosis Date  . Atrial fibrillation ([place])   . Cataract   . Diabetes mellitus without complication ([place])   . Hypertension   . Seizures [facility])     Patient Active Problem List   Diagnosis Date Noted  . Labyrinthitis 05/01/2019  . Meniere's disease 05/01/2019  . PVC (premature ventricular contraction) 07/14/2017  . Type 2 diabetes mellitus without complication, without long-term current use of insulin ([place]) 06/29/2017  . Persistent atrial fibrillation ([place])  06/17/2017  . Neck pain 12/04/2016  . Severe obesity (BMI 35.0-35.9 with comorbidity) ([place]) 09/06/2015  . Essential hypertension 02/07/2015  . Cataract 02/07/2015  . Allergic rhinitis 02/07/2015  . COPD (chronic obstructive pulmonary disease) ([place]) 02/07/2015  . Hyperthyroidism 02/07/2015  . Impaired fasting glucose 02/07/2015  . Sensorineural hearing loss (SNHL), bilateral 02/07/2015  . Simple chronic bronchitis ([place]) 02/07/2015  . Varicose veins of both lower extremities 02/07/2015  . Hypertension associated with diabetes ([place]) 02/07/2015  . Alcohol abuse 01/22/2015  . Cognitive and behavioral changes 01/22/2015  . Seizure ([place]) 01/22/2015  . Spell of loss of consciousness 11/17/2012  . Abnormal EEG 11/05/2012  . Syncope 11/05/2012  . Thrombocytopenia ([place]) 11/05/2012  . Anxiety 11/03/2012  . Dizziness and giddiness 11/03/2012    Past Surgical History:  Procedure Laterality Date  . CATARACT EXTRACTION    . PVC ABLATION N/A 07/14/2017   Procedure: PVC ABLATION;  Surgeon: Evans Lance, MD;  Location: [place] CV LAB;  Service: Cardiovascular;  Laterality: N/A;  . TONSILLECTOMY    . VARICOSE VEIN SURGERY         Family History  Problem Relation Age of Onset  . CAD Mother        MI at age 25    Social History   Tobacco Use  . Smoking status: Never Smoker  . Smokeless tobacco: Never Used  Vaping Use  .  Vaping Use: Never used  Substance Use Topics  . Alcohol use: Yes    Alcohol/week: 2.0 standard drinks    Types: 1 Shots of liquor, 1 Cans of beer per week    Comment: occasionally  . Drug use: No    Home Medications Prior to Admission medications   Medication Sig Start Date End Date Taking? Authorizing Provider  Accu-Chek FastClix Lancets MISC Use 1 lancet daily to test blood sugar (E11.9) 08/12/17   [provider]  ascorbic acid (VITAMIN C) 1000 MG tablet Take by mouth.    [provider]  BIOTIN PO Take 1 tablet by mouth daily.     [provider]  Calcium Carb-Cholecalciferol (CALCIUM 600+D3 PO) Take 1 tablet by mouth daily.    [provider]  Cholecalciferol (VITAMIN D-3) 25 MCG (1000 UT) CAPS Take by mouth.    [provider]  ELIQUIS 5 MG TABS tablet Take 1 tablet by mouth twice daily 04/28/19   [name], Peter M, MD  metFORMIN (GLUCOPHAGE-XR) 500 MG 24 hr tablet Take 500 mg by mouth daily with breakfast.    [provider]  Multiple Vitamin (MULTI-VITAMIN) tablet Take by mouth.    [provider]  Multiple Vitamin (MULTIVITAMIN WITH MINERALS) TABS tablet Take 1 tablet by mouth daily.    [provider]  Omega-3 Fatty Acids (FISH OIL PO) Take by mouth.    [provider]    Allergies    Patient has no known allergies.  Review of Systems   Review of Systems  Constitutional: Positive for diaphoresis. Negative for chills and fever.  HENT: Negative for congestion and facial swelling.   Eyes: Negative for discharge and visual disturbance.  Respiratory: Negative for shortness of breath.   Cardiovascular: Negative for chest pain and palpitations.  Gastrointestinal: Negative for abdominal pain, diarrhea and vomiting.  Musculoskeletal: Positive for neck pain. Negative for arthralgias and myalgias.  Skin: Negative for color change and rash.  Neurological: Positive for dizziness and headaches. Negative for tremors and syncope.  Psychiatric/Behavioral: Negative for confusion and dysphoric mood.    Physical Exam Updated Vital Signs BP (!) 158/94   Pulse 75   Temp 98.9 F (37.2 C) (Oral)   Resp 19   SpO2 96%   Physical Exam Vitals and nursing note reviewed.  Constitutional:      Appearance: He is well-developed.  HENT:     Head: Normocephalic and atraumatic.  Eyes:     Pupils: Pupils are equal, round, and reactive to light.  Neck:     Vascular: No JVD.  Cardiovascular:     Rate and Rhythm: Normal rate and regular rhythm.     Heart sounds: No  murmur heard.  No friction rub. No gallop.   Pulmonary:     Effort: No respiratory distress.     Breath sounds: No wheezing.  Abdominal:     General: There is no distension.     Tenderness: There is no guarding or rebound.  Musculoskeletal:        General: Normal range of motion.     Cervical back: Normal range of motion and neck supple.  Skin:    Coloration: Skin is not pale.     Findings: No rash.  Neurological:     Mental Status: He is alert and oriented to person, place, and time.     Motor: Motor function is intact.     Coordination: Coordination is intact.     Gait: Gait is intact.  Comments: Bilateral horizontal fast going nystagmus.  Otherwise benign neurologic exam.  Psychiatric:        Behavior: Behavior normal.     ED Results / Procedures / Treatments   Labs (all labs ordered are listed, but only abnormal results are displayed) Labs Reviewed  BASIC METABOLIC PANEL - Abnormal; Notable for the following components:      Result Value   Glucose, Bld 141 (*)    All other components within normal limits  CBC - Abnormal; Notable for the following components:   Hemoglobin 17.5 (*)    HCT 52.8 (*)    All other components within normal limits  CBG MONITORING, ED - Abnormal; Notable for the following components:   Glucose-Capillary 155 (*)    All other components within normal limits    EKG EKG Interpretation  Date/Time:  Sunday September 17 2019 14:04:49 EDT Ventricular Rate:  77 PR Interval:    QRS Duration: 124 QT Interval:  410 QTC Calculation: 463 R Axis:   -62 Text Interpretation: Atrial fibrillation Left axis deviation Right bundle branch block Possible Inferior infarct , age undetermined Abnormal ECG No significant change since last tracing Confirmed by Mani Celestin ([id number]) on 09/17/2019 3:07:19 PM   Radiology No results found.  Procedures Procedures (including critical care time)  Medications Ordered in ED Medications  prochlorperazine (COMPAZINE)  injection 5 mg (has no administration in time range)  diphenhydrAMINE (BENADRYL) injection 12.5 mg (has no administration in time range)  dexamethasone (DECADRON) tablet 10 mg (has no administration in time range)    ED Course  I have reviewed the triage vital signs and the nursing notes.  Pertinent labs & imaging results that were available during my care of the patient were reviewed by me and considered in my medical decision making (see chart for details).    MDM Rules/Calculators/A&P                          69  yo M with a cc of vertigo, unfortunately this is a chronic problem for him.  Has been going on for at least the past couple years.  Is seen multiple specialist for the same.  His symptoms got significantly worse over the past 3 days after starting nortriptyline for possible migraines.  I discussed MRI imaging of him.  He is declining as he has had 3 MRIs in the past and thinks this feels similar just worse.  We will try a headache cocktail here.  Have him follow-up with his neurologist in the office.  He is asking for referral to a neurologist in our system as well.  PCP follow-up.  3:59 PM:  I have discussed the diagnosis/risks/treatment options with the patient and family and believe the pt to be eligible for discharge home to follow-up with PCP, neuro, cards, ENT. We also discussed returning to the ED immediately if new or worsening sx occur. We discussed the sx which are most concerning (e.g., sudden worsening pain, fever, inability to tolerate by mouth, stroke s/sx) that necessitate immediate return. Medications administered to the patient during their visit and any new prescriptions provided to the patient are listed below.  Medications given during this visit Medications  prochlorperazine (COMPAZINE) injection 5 mg (has no administration in time range)  diphenhydrAMINE (BENADRYL) injection 12.5 mg (has no administration in time range)  dexamethasone (DECADRON) tablet 10 mg  (has no administration in time range)     The patient appears reasonably screen and/or  stabilized for discharge and I doubt any other medical condition or other [facility] requiring further screening, evaluation, or treatment in the ED at this time prior to discharge.   Final Clinical Impression(s) / ED Diagnoses Final diagnoses:  Unsteadiness    Rx / DC Orders ED Discharge Orders         Ordered    Ambulatory referral to Neurology     Discontinue  Reprint    Comments: Persistent vertigo   09/17/19 [place], Horatio, DO 09/17/19 1559

## 2019-09-18 ENCOUNTER — Encounter: Payer: Self-pay | Admitting: Neurology

## 2019-09-19 ENCOUNTER — Telehealth: Payer: Self-pay | Admitting: Cardiology

## 2019-09-19 NOTE — Telephone Encounter (Signed)
Patient would like for Dr. [name] or nurse to give him a phone call regarding scheduling an appt for monitor results

## 2019-09-19 NOTE — Telephone Encounter (Signed)
Message sent for monitor results

## 2019-09-19 NOTE — Telephone Encounter (Signed)
Per Dr [name] cancel scheduled appt and reschedule for after monitor completion. No VV. Appt rescheduled, pt will mail back monitor Friday. Reiterated that to pt many times. He states that he thinks that it does not matter. He will keep on monitor until completion and mail back as directed

## 2019-09-19 NOTE — Telephone Encounter (Signed)
Called pt and he states that he does still have his monitor on and that he will keep it on/ send it back Friday 09-22-2019. Pt states that one again he does not think that the monitor is working. He has appt scheduled tomorrow with Dr [name]. He would like to switch this to VV/ will send to Dr [name] to review. Zio report printed

## 2019-09-20 ENCOUNTER — Ambulatory Visit: Payer: Medicare Other | Admitting: Cardiology

## 2019-09-20 ENCOUNTER — Ambulatory Visit: Payer: Medicare Other | Admitting: Internal Medicine

## 2019-09-20 ENCOUNTER — Encounter: Payer: Self-pay | Admitting: Cardiology

## 2019-09-20 ENCOUNTER — Encounter: Payer: Self-pay | Admitting: Internal Medicine

## 2019-09-20 ENCOUNTER — Other Ambulatory Visit: Payer: Self-pay

## 2019-09-20 ENCOUNTER — Telehealth: Payer: Self-pay | Admitting: Cardiology

## 2019-09-20 VITALS — BP 166/82 | HR 82 | Ht 75.0 in | Wt 279.0 lb

## 2019-09-20 DIAGNOSIS — E063 Autoimmune thyroiditis: Secondary | ICD-10-CM | POA: Diagnosis not present

## 2019-09-20 DIAGNOSIS — I4821 Permanent atrial fibrillation: Secondary | ICD-10-CM | POA: Diagnosis not present

## 2019-09-20 DIAGNOSIS — I10 Essential (primary) hypertension: Secondary | ICD-10-CM

## 2019-09-20 NOTE — Telephone Encounter (Signed)
Call placed to Pt.  Pt scheduled to see GT today 09/20/19 at 4 pm.

## 2019-09-20 NOTE — Patient Instructions (Addendum)
Medication Instructions:  Your physician recommends that you continue on your current medications as directed. Please refer to the Current Medication list given to you today.  *If you need a refill on your cardiac medications before your next appointment, please call your pharmacy*  Lab Work: None ordered.  If you have labs (blood work) drawn today and your tests are completely normal, you will receive your results only by: . MyChart Message (if you have MyChart) OR . A paper copy in the mail If you have any lab test that is abnormal or we need to change your treatment, we will call you to review the results.  Testing/Procedures: None ordered.  Follow-Up: At [facility], you and your health needs are our priority.  As part of our continuing mission to provide you with exceptional heart care, we have created designated Provider Care Teams.  These Care Teams include your primary Cardiologist (physician) and Advanced Practice Providers (APPs -  Physician Assistants and Nurse Practitioners) who all work together to provide you with the care you need, when you need it.  We recommend signing up for the patient portal called "MyChart".  Sign up information is provided on this After Visit Summary.  MyChart is used to connect with patients for Virtual Visits (Telemedicine).  Patients are able to view lab/test results, encounter notes, upcoming appointments, etc.  Non-urgent messages can be sent to your provider as well.   To learn more about what you can do with MyChart, go to [email or web address]://www.mychart.com.      Other Instructions:  

## 2019-09-20 NOTE — Telephone Encounter (Signed)
[place] calling from [facility] with abnormal results to report. Transferred to triage.

## 2019-09-20 NOTE — Telephone Encounter (Signed)
Received a call from [facility] reporting an episode of slow Afib HR ( 33-44) around 6:24 am this morning. Nurse spoke to pt and report he is feeling dizzy, off balance, and weak. BP/HR currently 137/79 HR 54.   Report reviewed by Dr. [name] who recommend pt schedule an appointment with Dr. Lovena Le. Pt notified and message routed to MD's nurse and scheduler.

## 2019-09-20 NOTE — Progress Notes (Signed)
HPI [name] is referred today for followup by Dr. [name]. He is a pleasant 69 yo man with persistent atrial fib who was noted to have early morning bradycardia which occurred while sleeping. He had a slow VR with rates in the 30's. He was asymptomatic and asleep. He brings in with him a log of his BP's and HR's which reveals HR's in the 50's and 60's associated with mildly elevated bp's. He has been seen by me remotely for his PVC's andwas initially treated with mexilitine but developed neuro problems. Of note that patient had undergone EP study but his PVC's were very close to the AV node. The patient has a h/o recurrent near syncope and dizziness. It is unclear if this is due to an arrhythmia or not.  No Known Allergies   Current Outpatient Medications  Medication Sig Dispense Refill  . Accu-Chek FastClix Lancets MISC Use 1 lancet daily to test blood sugar (E11.9)    . ascorbic acid (VITAMIN C) 1000 MG tablet Take by mouth.    Marland Kitchen BIOTIN PO Take 1 tablet by mouth daily.    . Calcium Carb-Cholecalciferol (CALCIUM 600+D3 PO) Take 1 tablet by mouth daily.    . Cholecalciferol (VITAMIN D-3) 25 MCG (1000 UT) CAPS Take by mouth.    Arne Cleveland 5 MG TABS tablet Take 1 tablet by mouth twice daily 180 tablet 1  . lisinopril (ZESTRIL) 10 MG tablet Take 10 mg by mouth daily.    . metFORMIN (GLUCOPHAGE-XR) 500 MG 24 hr tablet Take 500 mg by mouth daily with breakfast.    . Multiple Vitamin (MULTI-VITAMIN) tablet Take by mouth.    . Multiple Vitamin (MULTIVITAMIN WITH MINERALS) TABS tablet Take 1 tablet by mouth daily.    . Omega-3 Fatty Acids (FISH OIL PO) Take by mouth.     No current facility-administered medications for this visit.     Past Medical History:  Diagnosis Date  . Atrial fibrillation ([place])   . Cataract   . Diabetes mellitus without complication ([place])   . Hypertension   . Seizures ([place])     ROS:   All systems reviewed and negative except as noted in the HPI.   Past  Surgical History:  Procedure Laterality Date  . CATARACT EXTRACTION    . PVC ABLATION N/A 07/14/2017   Procedure: PVC ABLATION;  Surgeon: Evans Lance, MD;  Location: [facility];  Service: Cardiovascular;  Laterality: N/A;  . TONSILLECTOMY    . VARICOSE VEIN SURGERY       Family History  Problem Relation Age of Onset  . CAD Mother        MI at age 9     Social History   Socioeconomic History  . Marital status: Widowed    Spouse name: Not on file  . Number of children: 2  . Years of education: Not on file  . Highest education level: Not on file  Occupational History  . Occupation: retired    Comment: Geophysicist/field seismologist  Tobacco Use  . Smoking status: Never Smoker  . Smokeless tobacco: Never Used  Vaping Use  . Vaping Use: Never used  Substance and Sexual Activity  . Alcohol use: Yes    Alcohol/week: 2.0 standard drinks    Types: 1 Shots of liquor, 1 Cans of beer per week    Comment: occasionally  . Drug use: No  . Sexual activity: Not on file  Other Topics Concern  . Not on  file  Social History Narrative  . Not on file   Social Determinants of Health   Financial Resource Strain:   . Difficulty of Paying Living Expenses:   Food Insecurity:   . Worried About Charity fundraiser in the Last Year:   . Arboriculturist in the Last Year:   Transportation Needs:   . Film/video editor (Medical):   Marland Kitchen Lack of Transportation (Non-Medical):   Physical Activity: Sufficiently Active  . Days of Exercise per Week: 4 days  . Minutes of Exercise per Session: 40 min  Stress:   . Feeling of Stress :   Social Connections:   . Frequency of Communication with Friends and Family:   . Frequency of Social Gatherings with Friends and Family:   . Attends Religious Services:   . Active Member of Clubs or Organizations:   . Attends Archivist Meetings:   Marland Kitchen Marital Status:   Intimate Partner Violence: Not At Risk  . Fear of Current or Ex-Partner: No  .  Emotionally Abused: No  . Physically Abused: No  . Sexually Abused: No     BP (!) 166/82   Pulse 82   Ht 6\' 3"  (1.905 m)   Wt (!) 279 lb (126.6 kg)   SpO2 94%   BMI 34.87 kg/m   Physical Exam:  Well appearing NAD HEENT: Unremarkable Neck:  No JVD, no thyromegally Lymphatics:  No adenopathy Back:  No CVA tenderness Lungs:  Clear with no wheezes HEART:  IRegular rate rhythm, no murmurs, no rubs, no clicks Abd:  soft, positive bowel sounds, no organomegally, no rebound, no guarding Ext:  2 plus pulses, no edema, no cyanosis, no clubbing Skin:  No rashes no nodules Neuro:  CN II through XII intact, motor grossly intact  Assess/Plan: 1. Atrial fib - he has a slow VR while sleeping but unclear how symptomatic he is. He does have dizziness which could be his slow atrial fib or something else. I have discussed the treatment options and recommended insertion of an ILR for atrial fib monitoring.  2. 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 We will follow. 3. Dizziness - the etiology remains unclear. It was my thought that we could correlate his symptoms with his heart monitor.   Lyman Bishop.D.

## 2019-09-22 ENCOUNTER — Telehealth: Payer: Self-pay | Admitting: Internal Medicine

## 2019-09-22 NOTE — Telephone Encounter (Signed)
Pt reports issue w/ rash for about a month now. The rash is "6 inches below my knees down to my feet, and on my forearms", "it gets dry and itchy".  His PCP has sent in a Rx medication for him to try (he doesn't know the name of med), not OTC like he has been using.  But he reports that they are still not sure of the cause of rash. He also reports continued feet swelling at night and dizziness.  Just seen and discussed w/ Dr. Lovena Le at [facility] earlier this week according to patient. Reports he mailed his heart monitor back today and is anxiously awaiting result to see if his heart is causing his dizziness/fatigue/no energy.  Aware that it may be 1 1/2 - 2 weeks after mailing before hearing back from our office on result. He goes on to state that a friend of his had some of the same symptoms on Eliquis and they disappeared when they switched to another blood thinner. He is wondering if he should try doing that to see if improvement is skin rash. Aware that Eliqus is most likely not the cause of his rash being that is started a month ago and he has been on Eliquis since March.  Aware that I will forward to Dr. Tanna Furry nurse to address with him when they return to office.  She will call him once discussed. Patient verbalized understanding and agreeable to plan.

## 2019-09-22 NOTE — Telephone Encounter (Signed)
Forwarding to pharmD to review reported SE and if related to Eliquis. Will await their response prior to calling pt back to discuss.

## 2019-09-22 NOTE — Telephone Encounter (Signed)
Pt c/o medication issue:  1. Name of Medication: ELIQUIS 5 MG TABS tablet  2. How are you currently taking this medication (dosage and times per day)? Take 1 tablet by mouth twice daily  3. Are you having a reaction (difficulty breathing--STAT)? Dizzy, hard to get up and stand up. Feet are swelling at night, he states he discussed this with Dr. Lovena Le at his visit on 7/28. There is also a rash on his legs and forearms.   4. What is your medication issue? Patient is calling and stating he is thinks he is having a reaction to the med. He states he spoke with a friend who knows someone who is on eliquis and has had the same symptoms.     He would also like to let Dr. Lovena Le know that until they can get these symptoms figured out, and what is the root cause, he would like to hold off on having the procedure that was dicussed - he stated it was where a monitor went under his skin.

## 2019-09-22 NOTE — Telephone Encounter (Signed)
Patient has been on Eliquis since March.  Are these new side effects?

## 2019-09-25 MED ORDER — RIVAROXABAN 20 MG PO TABS
20.0000 mg | ORAL_TABLET | Freq: Every day | ORAL | 1 refills | Status: DC
Start: 2019-09-25 — End: 2019-11-20

## 2019-09-25 NOTE — Telephone Encounter (Signed)
Spoke with patient.  In addition to the rash on his arms/legs, he has been having problems with ongoing dizziness and swelling in one leg.  Would like to switch away from Eliquis just to eliminate it as a possible cause of his current problems.  Reviewed information.   Patient can take Xarelto 20 mg once daily.  Reviewed information on dosing - he took Eliquis this am, so will start with Xarelto 20 mg this evening with dinner. Stressed need to take with food.

## 2019-09-25 NOTE — Telephone Encounter (Signed)
Will route to Dr.Jordan and RN.

## 2019-11-11 NOTE — Progress Notes (Signed)
Cardiology Office Note:    Date:  11/16/2019   ID:  Michael Whitney, DOB 08/21/1950, MRN [id number]  PCP:  Loraine Leriche., MD  [facility] Cardiologist:  Jacquise Rarick [name], MD  [facility] Electrophysiologist:  Cristopher Peru, MD   Referring MD: Libby Maw,*   Chief Complaint  Patient presents with   Dizziness    History of Present Illness:    Michael Whitney is a 69 y.o. male with a hx of permanent atrial fibrillation, hypertension, hyperthyroidism/Hashimoto thyroiditis and history of seizure. Event monitor obtained in 2014 was unremarkable. He only ever had one seizure episode, he was initially on Keppra, this was later discontinued. In January 2019, he was diagnosed with atrial fibrillation with controlled heart rate after he presented with lightheadedness, fatigue, dizziness and a 50 pound weight gain. The duration of atrial fibrillation was unknown. He was started on anticoagulation therapy. We pursued a rate control and anticoagulation strategy. Due to significant recurrent dizzy spell and fatigue, Holter monitor was placed in 2019 that showed atrial fibrillation with slow ventricular rate. He also had high PVC burden. He underwent EP study by Dr. Lovena Le which revealed that his PVCs were arising near his His bundle. Ablation was not recommended due to concern of potential heart block. He was prescribed mexiletine. 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  He was admitted to [facility] in October 2020. Echocardiogram wasokay but showed moderate MR. He felt majority of his symptoms at the time was related to thyroiditis.  Patient underwent radioactive iodine treatment and was placed on methimazole earlier this year due to persistent hyperthyroidism.  In Feb 2021 he went to the [facility] ED the day prior with flushing sensation  and dizziness.  High-sensitivity troponin was initially 312, this decreased to 269 upon repeat lab work.  MRI of the brain was negative for stroke.  He was instructed to follow-up with cardiology service.  Due to lower extremity tingling sensation, ABI was obtained which was normal.  Given the elevated troponin, Myoview performed on 04/27/2019 showed mild thinning of the inferior wall consistent with soft tissue attenuation, no ischemia was noted, echocardiogram was recommended to assess the inferior wall due to wall motion. Echo showed normal EF and mild AS. Event monitor showed Afib with average HR 64 with pauses up to 3.9 seconds. He was seen by Dr Lovena Le. Noted nocturnal bradycardia. Unclear if symptoms related to arrhythmia.  On follow up today he reports he is doing better. He brings a log of his BP and HR. Notes good BP control and HR has increased since he began an exercise program. He is going to the gym 4/week doing weight training, yoga, and riding a bike. Denies any dyspnea or chest pain. Notes he still has a constant dizziness and imbalance. If planning to see Neuro tomorrow.     Past Medical History:  Diagnosis Date   Atrial fibrillation ([place])    Cataract    Diabetes mellitus without complication ([place])    Hypertension    Seizures ([place])     Past Surgical History:  Procedure Laterality Date   CATARACT EXTRACTION     PVC ABLATION N/A 07/14/2017   Procedure: PVC ABLATION;  Surgeon: Evans Lance, MD;  Location: [facility];  Service: Cardiovascular;  Laterality: N/A;   TONSILLECTOMY     VARICOSE VEIN SURGERY  Current Medications: Current Meds  Medication Sig   Accu-Chek FastClix Lancets MISC Use 1 lancet daily to test blood sugar (E11.9)   ascorbic acid (VITAMIN C) 1000 MG tablet Take by mouth.   BIOTIN PO Take 1 tablet by mouth daily.   Calcium Carb-Cholecalciferol (CALCIUM 600+D3 PO) Take 1 tablet by mouth daily.   Cholecalciferol (VITAMIN D-3) 25  MCG (1000 UT) CAPS Take by mouth.   lisinopril (ZESTRIL) 10 MG tablet Take 10 mg by mouth daily.   metFORMIN (GLUCOPHAGE-XR) 500 MG 24 hr tablet Take 500 mg by mouth daily with breakfast.   Multiple Vitamin (MULTI-VITAMIN) tablet Take by mouth.   Multiple Vitamin (MULTIVITAMIN WITH MINERALS) TABS tablet Take 1 tablet by mouth daily.   Omega-3 Fatty Acids (FISH OIL PO) Take by mouth.   rivaroxaban (XARELTO) 20 MG TABS tablet Take 1 tablet (20 mg total) by mouth daily with supper.   [DISCONTINUED] ELIQUIS 5 MG TABS tablet Take 1 tablet by mouth twice daily     Allergies:   Patient has no known allergies.   Social History   Socioeconomic History   Marital status: Widowed    Spouse name: Not on file   Number of children: 2   Years of education: Not on file   Highest education level: Not on file  Occupational History   Occupation: retired    Comment: Geophysicist/field seismologist  Tobacco Use   Smoking status: Never Smoker   Smokeless tobacco: Never Used  Scientific laboratory technician Use: Never used  Substance and Sexual Activity   Alcohol use: Yes    Alcohol/week: 2.0 standard drinks    Types: 1 Shots of liquor, 1 Cans of beer per week    Comment: occasionally   Drug use: No   Sexual activity: Not on file  Other Topics Concern   Not on file  Social History Narrative   Not on file   Social Determinants of Health   Financial Resource Strain:    Difficulty of Paying Living Expenses: Not on file  Food Insecurity:    Worried About [place] in the Last Year: Not on file   Ran Out of Food in the Last Year: Not on file  Transportation Needs:    Lack of Transportation (Medical): Not on file   Lack of Transportation (Non-Medical): Not on file  Physical Activity: Sufficiently Active   Days of Exercise per Week: 4 days   Minutes of Exercise per Session: 40 min  Stress:    Feeling of Stress : Not on file  Social Connections:    Frequency of Communication with  Friends and Family: Not on file   Frequency of Social Gatherings with Friends and Family: Not on file   Attends Religious Services: Not on file   Active Member of Clubs or Organizations: Not on file   Attends Archivist Meetings: Not on file   Marital Status: Not on file     Family History: The patient's family history includes CAD in his mother.  ROS:   Please see the history of present illness.     All other systems reviewed and are negative.  EKGs/Labs/Other Studies Reviewed:    The following studies were reviewed today:  Myoview 04/27/2019  Lexiscan stress without ST changes to suggest ischemia  Mild thinning in the inferior wall (base, mid) Consistent with soft tissue attenuation (diaphragm), cannot exclude scar as images were not gated due to atrial fibrillation.. No ischemia noted.  Would recomm echo  to further define wall motion, particularly in the inferior wall.  Echo 08/29/19: IMPRESSIONS    1. Left ventricular ejection fraction, by estimation, is 55 to 60%. The  left ventricle has normal function. The left ventricle has no regional  wall motion abnormalities. There is mild concentric left ventricular  hypertrophy. Left ventricular diastolic  function could not be evaluated.  2. Right ventricular systolic function is mildly reduced. The right  ventricular size is normal.  3. Left atrial size was moderately dilated.  4. The mitral valve is normal in structure. Trivial mitral valve  regurgitation.  5. The aortic valve is tricuspid. Aortic valve regurgitation is mild.  Mild aortic valve stenosis.  6. The inferior vena cava is normal in size with greater than 50%  respiratory variability, suggesting right atrial pressure of 3 mmHg.   Comparison(s): No significant change from prior study.   Event monitor 10/04/19: Study Highlights   Atrial fibrillation with average HR 64. range 32-135 bpm  Several pauses longest 3.9 seconds. all pauses occured  between hours of 2-6 am  PVCs versus aberrancy with one 4 beat run of NSVT   EKG:  EKG is not ordered today.    Recent Labs: 09/09/2019: ALT 24; TSH 0.499 09/17/2019: BUN 23; Creatinine, Ser 1.08; Hemoglobin 17.5; Platelets 150; Potassium 4.5; Sodium 136   Recent Lipid Panel    Component Value Date/Time   CHOL 111 11/04/2012 1125   TRIG 104 11/04/2012 1125   HDL 35 (L) 11/04/2012 1125   CHOLHDL 3.2 11/04/2012 1125   VLDL 21 11/04/2012 1125   LDLCALC 55 11/04/2012 1125    Physical Exam:    VS:  BP 140/78    Pulse 83    Temp 97.7 F (36.5 C)    Ht 6\' 3"  (1.905 m)    Wt 280 lb 3.2 oz (127.1 kg)    SpO2 99%    BMI 35.02 kg/m     Wt Readings from Last 3 Encounters:  11/16/19 280 lb 3.2 oz (127.1 kg)  09/20/19 (!) 279 lb (126.6 kg)  09/09/19 275 lb (124.7 kg)     GEN:  Well nourished, well developed in no acute distress HEENT: Normal NECK: No JVD; No carotid bruits LYMPHATICS: No lymphadenopathy CARDIAC: RRR, no murmurs, rubs, gallops RESPIRATORY:  Clear to auscultation without rales, wheezing or rhonchi  ABDOMEN: Soft, non-tender, non-distended MUSCULOSKELETAL:  No edema; No deformity  SKIN: Warm and dry NEUROLOGIC:  Alert and oriented x 3 PSYCHIATRIC:  Normal affect   ASSESSMENT:    1. Permanent atrial fibrillation ([place])   2. Essential hypertension   3. Dizziness   4. Hashimoto's thyroiditis    PLAN:    In order of problems listed above:  1. Dyspnea: Echo OK.  Recent Myoview showed no significant ischemia. I suspect this is more related to deconditioning. Encourage continued exercise regimen.   2. Permanent atrial fibrillation: On Eliquis.  Periods of bradycardia/pauses nocturnal. Evaluated by EP. Based on history his symptoms of dizziness do not correlate with arrhythmia since dizziness is constant.   3. Hypertension: Blood pressure well controlled  4. Hashimoto's thyroiditis: He underwent iodine treatment earlier this year. Last labs in July showed normal  TSH and Free T4 levels.   Follow up in 6 months.   Medication Adjustments/Labs and Tests Ordered: Current medicines are reviewed at length with the patient today.  Concerns regarding medicines are outlined above.  No orders of the defined types were placed in this encounter.  No orders of  the defined types were placed in this encounter.   There are no Patient Instructions on file for this visit.   Signed, Aino Heckert [name], MD  11/16/2019 12:27 PM     [facility]

## 2019-11-16 ENCOUNTER — Other Ambulatory Visit: Payer: Self-pay

## 2019-11-16 ENCOUNTER — Ambulatory Visit: Payer: Medicare Other | Admitting: Cardiology

## 2019-11-16 ENCOUNTER — Encounter: Payer: Self-pay | Admitting: Cardiology

## 2019-11-16 VITALS — BP 140/78 | HR 83 | Temp 97.7°F | Ht 75.0 in | Wt 280.2 lb

## 2019-11-16 DIAGNOSIS — I10 Essential (primary) hypertension: Secondary | ICD-10-CM

## 2019-11-16 DIAGNOSIS — R42 Dizziness and giddiness: Secondary | ICD-10-CM | POA: Diagnosis not present

## 2019-11-16 DIAGNOSIS — E063 Autoimmune thyroiditis: Secondary | ICD-10-CM | POA: Diagnosis not present

## 2019-11-16 DIAGNOSIS — I4821 Permanent atrial fibrillation: Secondary | ICD-10-CM | POA: Diagnosis not present

## 2019-11-16 NOTE — Progress Notes (Signed)
NEUROLOGY CONSULTATION NOTE  Michael Whitney MRN: [id number] DOB: [birth year]  Referring provider: Deno Etienne, DO (ED referral) Primary care provider: Idamae Schuller, MD  Reason for consult:  Persistent vertigo  HISTORY OF PRESENT ILLNESS: Michael Whitney is a 69 year old right-handed male with atrial fibrillation on Eliquis, type 2 diabetes mellitus, HTN, hyperthyroidism and history of prior alcohol abuse and alcohol-withdrawal seizures who presents for dizziness.  History supplemented by ED and prior neurologists' notes.  He started having dizziness about 6 years ago when he was diagnosed with hyperthyroidism.  He describes a constant feeling of motion sickness.  He feels lightheaded and notes sense of movement when he moves his head quickly but no spinning.  Intensity does fluctuate.  It can be triggered by movement, standing up or when driving.  Sometimes some blurred vision but no double vision.  Some mild nausea.  He has some neck tightness but no significant pain.  He has had physical therapy on the neck which hasn't really helped.  Cervical spine X-ray from August 2019 showed mild degenerative changes.  He has been seen by ENT.  BPPV was ruled out.  Vestibular testing reportedly unremarkable.  He is followed by cardiology.  He has persistent atrial fibrillation.  Past Holter monitor reportedly showed intermittent bradycardia.  Echo and stress test were unremarkable.  He has had an extensive workup with neurology at [facility] in 2020.  MRI of brain and MRA of head and neck from June 2020 sowed moderate cerebral and cerebellar atrophy but no acute intracranial abnormality or intracranial and extracranial high-grade stenosis or occlusion.  A follow up CTA of head was normal.  In July 2020, he had an EEG to evaluate for possible cerebellar seizures, which was normal.  It was believed that his symptoms may be multifactorial, related to BPPV, intermittent bradycardia (as was documented on Holter  monitor) and OSA. 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  However, symptoms became worse.    Of note, he had MRI of brain in September 2020 for left sided facial paresthesias, personally reviewed, which was negative for acute infarct.  In July 2021 he was again evaluated for left sided numbness as well as dizziness.  CTA of head and neck personally reviewed showed no hemodynamically significant intracranial and extracranial stenosis or occlusion.  No acute infarct noted.  Thyroid has since been controlled.  PAST MEDICAL HISTORY: Past Medical History:  Diagnosis Date  . Atrial fibrillation ([place])   . Cataract   . Diabetes mellitus without complication ([place])   . Hypertension   . Seizures ([place])     PAST SURGICAL HISTORY: Past Surgical History:  Procedure Laterality Date  . CATARACT EXTRACTION    . PVC ABLATION N/A 07/14/2017   Procedure: PVC ABLATION;  Surgeon: Evans Lance, MD;  Location: [place] [facility];  Service: Cardiovascular;  Laterality: N/A;  . TONSILLECTOMY    . VARICOSE VEIN SURGERY      MEDICATIONS: Current Outpatient Medications on File Prior to Visit  Medication Sig Dispense Refill  . Accu-Chek FastClix Lancets MISC Use 1 lancet daily to test blood sugar (E11.9)    . ascorbic acid (VITAMIN C) 1000 MG tablet Take by mouth.    Marland Kitchen BIOTIN PO Take 1 tablet by mouth daily.    . Calcium Carb-Cholecalciferol (CALCIUM 600+D3 PO) Take 1 tablet by mouth daily.    . Cholecalciferol (VITAMIN D-3) 25 MCG (1000 UT) CAPS  Take by mouth.    Marland Kitchen lisinopril (ZESTRIL) 10 MG tablet Take 10 mg by mouth daily.    . metFORMIN (GLUCOPHAGE-XR) 500 MG 24 hr tablet Take 500 mg by mouth daily with breakfast.    . Multiple Vitamin (MULTI-VITAMIN) tablet Take by mouth.    . Multiple Vitamin (MULTIVITAMIN WITH MINERALS) TABS tablet Take 1 tablet by mouth daily.    . Omega-3 Fatty Acids (FISH OIL PO) Take by mouth.    . rivaroxaban (XARELTO) 20  MG TABS tablet Take 1 tablet (20 mg total) by mouth daily with supper. 30 tablet 1   No current facility-administered medications on file prior to visit.    ALLERGIES: No Known Allergies  FAMILY HISTORY: Family History  Problem Relation Age of Onset  . CAD Mother        MI at age 69    SOCIAL HISTORY: Social History   Socioeconomic History  . Marital status: Widowed    Spouse name: Not on file  . Number of children: 2  . Years of education: Not on file  . Highest education level: Not on file  Occupational History  . Occupation: retired    Comment: Geophysicist/field seismologist  Tobacco Use  . Smoking status: Never Smoker  . Smokeless tobacco: Never Used  Vaping Use  . Vaping Use: Never used  Substance and Sexual Activity  . Alcohol use: Yes    Alcohol/week: 2.0 standard drinks    Types: 1 Shots of liquor, 1 Cans of beer per week    Comment: occasionally  . Drug use: No  . Sexual activity: Not on file  Other Topics Concern  . Not on file  Social History Narrative  . Not on file   Social Determinants of Health   Financial Resource Strain:   . Difficulty of Paying Living Expenses: Not on file  Food Insecurity:   . Worried About Charity fundraiser in the Last Year: Not on file  . Ran Out of Food in the Last Year: Not on file  Transportation Needs:   . Lack of Transportation (Medical): Not on file  . Lack of Transportation (Non-Medical): Not on file  Physical Activity: Sufficiently Active  . Days of Exercise per Week: 4 days  . Minutes of Exercise per Session: 40 min  Stress:   . Feeling of Stress : Not on file  Social Connections:   . Frequency of Communication with Friends and Family: Not on file  . Frequency of Social Gatherings with Friends and Family: Not on file  . Attends Religious Services: Not on file  . Active Member of Clubs or Organizations: Not on file  . Attends Archivist Meetings: Not on file  . Marital Status: Not on file  Intimate Partner  Violence: Not At Risk  . Fear of Current or Ex-Partner: No  . Emotionally Abused: No  . Physically Abused: No  . Sexually Abused: No    PHYSICAL EXAM: Blood pressure (!) 168/88, pulse 79, height 6\' 3"  (1.905 m), weight 281 lb (127.5 kg), SpO2 96 %. General: No acute distress.  Patient appears well-groomed.   Head:  Normocephalic/atraumatic Eyes:  fundi examined but not visualized Neck: supple, no paraspinal tenderness, full range of motion Back: No paraspinal tenderness Heart: regular rate and rhythm Lungs: Clear to auscultation bilaterally. Vascular: No carotid bruits. Neurological Exam: Mental status: alert and oriented to person, place, and time, recent and remote memory intact, fund of knowledge intact, attention and concentration intact, speech fluent  and not dysarthric, language intact. Cranial nerves: CN I: not tested CN II: pupils equal, round and reactive to light, visual fields intact CN III, IV, VI:  full range of motion, no nystagmus, no ptosis CN V: facial sensation intact CN VII: upper and lower face symmetric CN VIII: hearing intact CN IX, X: gag intact, uvula midline CN XI: sternocleidomastoid and trapezius muscles intact CN XII: tongue midline Bulk & Tone: normal, no fasciculations. Motor:  5/5 throughout  Sensation:  Pinprick sensation intact.  Vibratory sensation mildly reduced in feet. Deep Tendon Reflexes:  1+ upper extremities, absent lower extremities, toes downgoing.   Finger to nose testing:  Without dysmetria.   Heel to shin:  Without dysmetria.   Gait:  Mildly wide-based gait.  Able to turn and tandem walk. Romberg negative.  IMPRESSION: Persistent postural-perceptual dizziness.  He reports that vestibular testing by ENT was unremarkable.  It is not consistent with BPPV.  Cervical spinal X-ray does not reveal any significant degenerative changes to suggest cervicogenic etiology.  Imaging of the brain and intracranial and extracranial vasculature was  unremarkable.  It is not consistent with vestibular migraine.  He did have positive orthostatic vitals, but I am not sure that is the primary source of his dizziness.  At this point, I can only recommend supportive management.  Metta Clines, DO  CC:  Idamae Schuller, MD

## 2019-11-17 ENCOUNTER — Ambulatory Visit: Payer: Medicare Other | Admitting: Neurology

## 2019-11-17 ENCOUNTER — Encounter: Payer: Self-pay | Admitting: Neurology

## 2019-11-17 VITALS — BP 168/88 | HR 79 | Ht 75.0 in | Wt 281.0 lb

## 2019-11-17 DIAGNOSIS — I951 Orthostatic hypotension: Secondary | ICD-10-CM | POA: Diagnosis not present

## 2019-11-17 DIAGNOSIS — R42 Dizziness and giddiness: Secondary | ICD-10-CM | POA: Diagnosis not present

## 2019-11-17 NOTE — Patient Instructions (Addendum)
I have no specific diagnosis however I would consider persistent postural perceptual dizziness (or chronic subjective dizziness)

## 2019-11-20 ENCOUNTER — Other Ambulatory Visit: Payer: Self-pay | Admitting: Cardiology

## 2019-12-12 ENCOUNTER — Ambulatory Visit: Payer: Medicare Other | Admitting: Neurology

## 2020-05-10 NOTE — Progress Notes (Signed)
Virtual Visit via Telephone Note   This visit type was conducted due to national recommendations for restrictions regarding the COVID-19 Pandemic (e.g. social distancing) in an effort to limit this patient's exposure and mitigate transmission in our community.  Due to his co-morbid illnesses, this patient is at least at moderate risk for complications without adequate follow up.  This format is felt to be most appropriate for this patient at this time.  The patient did not have access to video technology/had technical difficulties with video requiring transitioning to audio format only (telephone).  All issues noted in this document were discussed and addressed.  No physical exam could be performed with this format.  Please refer to the patient's chart for his  consent to telehealth for [facility].    Date:  05/13/2020   ID:  Michael Whitney, DOB 1950/06/10, MRN [id number] The patient was identified using 2 identifiers.  Patient Location: Home Provider Location: Office/Clinic   PCP:  Loraine Leriche., MD   [place]  Cardiologist:  Vanden Fawaz [name], MD  Advanced Practice Provider:  No care team member to display Electrophysiologist:  Cristopher Peru, MD  [phone]  Evaluation Performed:  Follow-Up Visit  Chief Complaint:  Afib  History of Present Illness:    Michael Whitney is a 70 y.o. male  with a hx of permanentatrial fibrillation, hypertension, hyperthyroidism/Hashimoto thyroiditis and history of seizure. Event monitor obtained in 2014 was unremarkable. He only ever had one seizure episode, he was initially on Keppra, this was later discontinued. In January 2019, he was diagnosed with atrial fibrillation with controlled heart rate after he presented with lightheadedness, fatigue, dizziness and a 50 pound weight gain. The duration of atrial fibrillation was unknown. He was started on anticoagulation therapy. We pursued a rate control and anticoagulation  strategy. Due to significant recurrent dizzy spell and fatigue, Holter monitor was placed in 2019 that showed atrial fibrillation with slow ventricular rate. He also had high PVC burden. He underwent EP study by Dr. Lovena Le which revealed that his PVCs were arising near his His bundle. Ablation was not recommended due to concern of potential heart block. He was prescribed mexiletine. 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  He was admitted to [facility] in October 2020. Echocardiogram wasokay but showed moderate MR. He felt majority of his symptoms at the time was related to thyroiditis.  Patient underwent radioactive iodine treatment and was placed on methimazole due to persistent hyperthyroidism.  In Feb 2021 he [facility] the day prior with flushing sensation and dizziness.  High-sensitivity troponin was initially 312, this decreased to 269 upon repeat lab work.  MRI of the brain was negative for stroke.  He was instructed to follow-up with cardiology service.  Due to lower extremity tingling sensation, ABI was obtained which was normal.  Given the elevated troponin, Myoview performed on 04/27/2019 showed mild thinning of the inferior wall consistent with soft tissue attenuation, no ischemia was noted, echocardiogram was recommended to assess the inferior wall due to wall motion. Echo showed normal EF and mild AS. Event monitor showed Afib with average HR 64 with pauses up to 3.9 seconds. He was seen by Dr Lovena Le. Noted nocturnal bradycardia. He did not feel symptoms related to arrhythmia.  On follow up today he reports he continues to have dizziness pretty constantly.  He is going to the gym 4/week doing weight training, yoga, and riding a bike. Denies any dyspnea or chest pain. He was in [facility] a month ago. Had lab work and extensive  evaluation with head and neck CTs and MRIs. Has been evaluated by Neuro without significant findings. Still on methimazole every other day for thyroid and reports most recent levels were OK.   The patient does not have symptoms concerning for COVID-19 infection (fever, chills, cough, or new shortness of breath).    Past Medical History:  Diagnosis Date  . Atrial fibrillation ([place])   . Cataract   . Diabetes mellitus without complication ([place])   . Hypertension   . Seizures ([place])    Past Surgical History:  Procedure Laterality Date  . CATARACT EXTRACTION    . PVC ABLATION N/A 07/14/2017   Procedure: PVC ABLATION;  Surgeon: Evans Lance, MD;  Location: [place] [facility];  Service: Cardiovascular;  Laterality: N/A;  . TONSILLECTOMY    . VARICOSE VEIN SURGERY       Current Meds  Medication Sig  . Accu-Chek FastClix Lancets MISC Use 1 lancet daily to test blood sugar (E11.9)  . ascorbic acid (VITAMIN C) 1000 MG tablet Take by mouth.  Marland Kitchen azelastine (ASTELIN) 0.1 % nasal spray Place 1 spray into both nostrils 2 (two) times daily as needed for rhinitis. Use in each nostril as directed  . BIOTIN PO Take 1 tablet by mouth daily.  . Calcium Carb-Cholecalciferol (CALCIUM 600+D3 PO) Take 1 tablet by mouth daily.  . Cholecalciferol (VITAMIN D-3) 25 MCG (1000 UT) CAPS Take by mouth.  . hydrochlorothiazide (MICROZIDE) 12.5 MG capsule Take 1 capsule (12.5 mg total) by mouth daily.  Marland Kitchen losartan (COZAAR) 100 MG tablet Take 1 tablet (100 mg total) by mouth daily.  . methimazole (TAPAZOLE) 5 MG tablet Take 1 tablet (5 mg total) by mouth every other day.  . Multiple Vitamin (MULTI-VITAMIN) tablet Take by mouth.  . Omega-3 Fatty Acids (FISH OIL PO) Take by mouth.  Alveda Reasons 20 MG TABS tablet TAKE 1 TABLET(20 MG) BY MOUTH DAILY WITH SUPPER  . [DISCONTINUED] lisinopril (ZESTRIL) 10 MG tablet Take 10 mg by mouth daily.     Allergies:   Patient has no known allergies.   Social History   Tobacco Use   . Smoking status: Never Smoker  . Smokeless tobacco: Never Used  Vaping Use  . Vaping Use: Never used  Substance Use Topics  . Alcohol use: Yes    Alcohol/week: 2.0 standard drinks    Types: 1 Shots of liquor, 1 Cans of beer per week    Comment: occasionally  . Drug use: No     Family Hx: The patient's family history includes CAD in his mother.  ROS:   Please see the history of present illness.    All other systems reviewed and are negative.   Prior CV studies:   The following studies were reviewed today:  Myoview 04/27/2019  Lexiscan stress without ST changes to suggest ischemia  Mild thinning in the inferior wall (base, mid) Consistent with soft tissue attenuation (diaphragm), cannot exclude scar as images were not gated due to atrial fibrillation.. No ischemia noted.  Would recomm echo to further define wall motion, particularly in the inferior wall.  Echo 08/29/19: IMPRESSIONS    1. Left ventricular ejection fraction, by estimation, is 55 to 60%. The  left ventricle has normal function. The left ventricle has no regional  wall motion  abnormalities. There is mild concentric left ventricular  hypertrophy. Left ventricular diastolic  function could not be evaluated.  2. Right ventricular systolic function is mildly reduced. The right  ventricular size is normal.  3. Left atrial size was moderately dilated.  4. The mitral valve is normal in structure. Trivial mitral valve  regurgitation.  5. The aortic valve is tricuspid. Aortic valve regurgitation is mild.  Mild aortic valve stenosis.  6. The inferior vena cava is normal in size with greater than 50%  respiratory variability, suggesting right atrial pressure of 3 mmHg.   Comparison(s): No significant change from prior study.   Event monitor 10/04/19: Study Highlights   Atrial fibrillation with average HR 64. range 32-135 bpm  Several pauses longest 3.9 seconds. all pauses occured between hours of 2-6  am  PVCs versus aberrancy with one 4 beat run of NSVT   Labs/Other Tests and Data Reviewed:    EKG:  No ECG reviewed.  Recent Labs: 09/09/2019: ALT 24; TSH 0.499 09/17/2019: BUN 23; Creatinine, Ser 1.08; Hemoglobin 17.5; Platelets 150; Potassium 4.5; Sodium 136   Recent Lipid Panel Lab Results  Component Value Date/Time   CHOL 111 11/04/2012 11:25 AM   TRIG 104 11/04/2012 11:25 AM   HDL 35 (L) 11/04/2012 11:25 AM   CHOLHDL 3.2 11/04/2012 11:25 AM   LDLCALC 55 11/04/2012 11:25 AM    Wt Readings from Last 3 Encounters:  05/13/20 278 lb (126.1 kg)  11/17/19 281 lb (127.5 kg)  11/16/19 280 lb 3.2 oz (127.1 kg)     Risk Assessment/Calculations:    CHA2DS2-VASc Score = 2  This indicates a 2.2% annual risk of stroke. The patient's score is based upon: CHF History: No HTN History: Yes Diabetes History: No Stroke History: No Vascular Disease History: No Age Score: 1 Gender Score: 0     Objective:    Vital Signs:  BP 135/70   Pulse 67   Ht 6\' 3"  (1.905 m)   Wt 278 lb (126.1 kg)   BMI 34.75 kg/m    VITAL SIGNS:  reviewed  ASSESSMENT & PLAN:    1. Dyspnea: Echo OK.  Myoview showed no significant ischemia.  Encourage continued exercise regimen. Improved.  2. Permanent atrial fibrillation: On Eliquis.  Periods of bradycardia/pauses nocturnal. Evaluated by EP. Based on history his symptoms of dizziness do not correlate with arrhythmia since dizziness is constant.   3. Hypertension: Blood pressure well controlled  4. Hashimoto's thyroiditis: s/p iodine treatment earlier. On methimazole.  5.   Dizziness. No clear cardiac cause of symptoms which are constant and unrelated to BP or HR.         COVID-19 Education: The signs and symptoms of COVID-19 were discussed with the patient and how to seek care for testing (follow up with PCP or arrange E-visit).  The importance of social distancing was discussed today.  Time:   Today, I have spent 15 minutes with the  patient with telehealth technology discussing the above problems.     Medication Adjustments/Labs and Tests Ordered: Current medicines are reviewed at length with the patient today.  Concerns regarding medicines are outlined above.   Tests Ordered: No orders of the defined types were placed in this encounter.   Medication Changes: No orders of the defined types were placed in this encounter.   Follow Up:  In Person in 1 year(s)  Signed, Ayanna Gheen [name], MD  05/13/2020 2:03 PM    [place] '

## 2020-05-13 ENCOUNTER — Other Ambulatory Visit: Payer: Self-pay | Admitting: Cardiology

## 2020-05-13 ENCOUNTER — Telehealth (INDEPENDENT_AMBULATORY_CARE_PROVIDER_SITE_OTHER): Payer: Medicare Other | Admitting: Cardiology

## 2020-05-13 ENCOUNTER — Telehealth: Payer: Self-pay

## 2020-05-13 ENCOUNTER — Encounter: Payer: Self-pay | Admitting: Cardiology

## 2020-05-13 VITALS — BP 135/70 | HR 67 | Ht 75.0 in | Wt 278.0 lb

## 2020-05-13 DIAGNOSIS — R42 Dizziness and giddiness: Secondary | ICD-10-CM

## 2020-05-13 DIAGNOSIS — I4821 Permanent atrial fibrillation: Secondary | ICD-10-CM | POA: Diagnosis not present

## 2020-05-13 DIAGNOSIS — I10 Essential (primary) hypertension: Secondary | ICD-10-CM

## 2020-05-13 DIAGNOSIS — E063 Autoimmune thyroiditis: Secondary | ICD-10-CM | POA: Diagnosis not present

## 2020-05-13 DIAGNOSIS — I493 Ventricular premature depolarization: Secondary | ICD-10-CM

## 2020-05-13 NOTE — Patient Instructions (Signed)
Medication Instructions:  Continue all medications *If you need a refill on your cardiac medications before your next appointment, please call your pharmacy*   Lab Work: None ordered   Testing/Procedures: None ordered   Follow-Up: At [facility], you and your health needs are our priority.  As part of our continuing mission to provide you with exceptional heart care, we have created designated Provider Care Teams.  These Care Teams include your primary Cardiologist (physician) and Advanced Practice Providers (APPs -  Physician Assistants and Nurse Practitioners) who all work together to provide you with the care you need, when you need it.  We recommend signing up for the patient portal called "MyChart".  Sign up information is provided on this After Visit Summary.  MyChart is used to connect with patients for Virtual Visits (Telemedicine).  Patients are able to view lab/test results, encounter notes, upcoming appointments, etc.  Non-urgent messages can be sent to your provider as well.   To learn more about what you can do with MyChart, go to [email or web address].    Your next appointment:  1 year    Call in Jan to schedule March appointment    The format for your next appointment: Office   Provider:  Dr.Jordan

## 2020-05-13 NOTE — Telephone Encounter (Signed)
  Patient Consent for Virtual Visit         Michael Whitney has provided verbal consent on 05/13/2020 for a virtual visit (video or telephone).   CONSENT FOR VIRTUAL VISIT FOR:  Michael Whitney  By participating in this virtual visit I agree to the following:  I hereby voluntarily request, consent and authorize [facility] and its employed or contracted physicians, physician assistants, nurse practitioners or other licensed health care professionals (the Practitioner), to provide me with telemedicine health care services (the "Services") as deemed necessary by the treating Practitioner. I acknowledge and consent to receive the Services by the Practitioner via telemedicine. I understand that the telemedicine visit will involve communicating with the Practitioner through live audiovisual communication technology and the disclosure of certain medical information by electronic transmission. I acknowledge that I have been given the opportunity to request an in-person assessment or other available alternative prior to the telemedicine visit and am voluntarily participating in the telemedicine visit.  I understand that I have the right to withhold or withdraw my consent to the use of telemedicine in the course of my care at any time, without affecting my right to future care or treatment, and that the Practitioner or I may terminate the telemedicine visit at any time. I understand that I have the right to inspect all information obtained and/or recorded in the course of the telemedicine visit and may receive copies of available information for a reasonable fee.  I understand that some of the potential risks of receiving the Services via telemedicine include:  Marland Kitchen Delay or interruption in medical evaluation due to technological equipment failure or disruption; . Information transmitted may not be sufficient (e.g. poor resolution of images) to allow for appropriate medical decision making by the Practitioner;  and/or  . In rare instances, security protocols could fail, causing a breach of personal health information.  Furthermore, I acknowledge that it is my responsibility to provide information about my medical history, conditions and care that is complete and accurate to the best of my ability. I acknowledge that Practitioner's advice, recommendations, and/or decision may be based on factors not within their control, such as incomplete or inaccurate data provided by me or distortions of diagnostic images or specimens that may result from electronic transmissions. I understand that the practice of medicine is not an exact science and that Practitioner makes no warranties or guarantees regarding treatment outcomes. I acknowledge that a copy of this consent can be made available to me via my patient portal ([place]), or I can request a printed copy by calling the office of [facility].    I understand that my insurance will be billed for this visit.   I have read or had this consent read to me. . I understand the contents of this consent, which adequately explains the benefits and risks of the Services being provided via telemedicine.  . I have been provided ample opportunity to ask questions regarding this consent and the Services and have had my questions answered to my satisfaction. . I give my informed consent for the services to be provided through the use of telemedicine in my medical care

## 2020-07-16 ENCOUNTER — Telehealth: Payer: Self-pay | Admitting: Internal Medicine

## 2020-07-16 DIAGNOSIS — R42 Dizziness and giddiness: Secondary | ICD-10-CM

## 2020-07-16 DIAGNOSIS — R531 Weakness: Secondary | ICD-10-CM

## 2020-07-16 NOTE — Telephone Encounter (Signed)
Spoke to patient. Patient calling stating he has been feeling very weak today that he can barely do anything.  Patient states he is in permanent afib. He states he has been feeling bad since Saturday ( dizziness) .   Patient states he did call his primary .  They instructed him to wear monitor ( which schedule to be placed Jun 7) and continue monitor symptoms    today blood pressure 116/62 pulse 59-99, O2 sat 97%  no nausea, no chest pain , no shortness of breath, no symptoms of stroke    patient is only taking Losartan 100 mg  , HCTZ 12.5 mg , Xarelto  20 mg - cardiac medication  RN recommend contacting primary again - will defer to Dr [name] as well.  RN informed patient if he still feels as bad or symptoms become worse   Go to ER or [facility].

## 2020-07-16 NOTE — Telephone Encounter (Signed)
Pt c/o medication issue:  1. Name of Medication: XARELTO 20 MG TABS tablet  2. How are you currently taking this medication (dosage and times per day)? As prescribed  3. Are you having a reaction (difficulty breathing--STAT)? No  4. What is your medication issue? PT is is feeling that the medication is causing fatigue

## 2020-07-16 NOTE — Telephone Encounter (Signed)
Spoke to patient -  Direction given to have lab work done - patient sates he will go to [employer]  ( [facility] BMP) at [address]. Patient states he will also call primary to see if she will do labs for thyroid.  Order placed

## 2020-07-16 NOTE — Telephone Encounter (Signed)
Xarelto should not make him weak unless his Hgb has dropped. Would recommend some basic lab work- CBC and BMET.  Hadar Elgersma [name] MD, [facility]

## 2020-07-24 ENCOUNTER — Telehealth: Payer: Self-pay | Admitting: Cardiology

## 2020-07-24 NOTE — Telephone Encounter (Signed)
STAT if patient feels like he/she is going to faint   1) Are you dizzy now? Yes   2) Do you feel faint or have you passed out? Faint and weak   3) Do you have any other symptoms? Weakness lightheaded patient also has a rash on his arms.   4) Have you checked your HR and BP (record if available)? 129/71 pulse 64 surger 136 yesterday BP was up little and then it went back down. Patient PCP stated patient might need a monitor.

## 2020-07-24 NOTE — Telephone Encounter (Signed)
Returned the call to the patient. He was calling to follow up on the continuous feelings of fatigue, dizziness and brain fog. He stated that it is not getting any better.   He had recent lab work completed which was normal (results in epic).  He is due to wear another cardiac monitor from his PCP but he does not feel like this is necessary.  He stated that his arm has had a rash on it which he feels like is getting worse. He saw a dermatologist for it but nothing came of it.   He has tried a gluten free diet but feels like this made him worse and he has gained weight.   He did see PCP on Friday   The patient has seen neurology, endocrinology, a nutrionist, and a balance specialist.   He feels like it could all be coming from his thyroid. He has been advised to reach out to his endocrinologist but it does sound like the appropriate tests have been completed thus far and he has been received the appropriate care.   He would like to have Dr. Doug Sou opinion as well.

## 2020-07-25 NOTE — Telephone Encounter (Signed)
Spoke to patient Dr.Jordan's advice given.Advised to follow up with PCP.

## 2020-07-25 NOTE — Telephone Encounter (Signed)
Really don't know that we can add much from a cardiac standpoint. He was seen by Dr. Lovena Le and he felt his symptoms were not arrhythmia related. He has already worn event monitor a couple of times. As long as BP is controlled he could reduce losartan to 50 mg daily. Otherwise follow up with primary  Hagan Vanauken [name] MD, [facility]

## 2020-09-27 ENCOUNTER — Telehealth: Payer: Self-pay | Admitting: Cardiology

## 2020-09-27 NOTE — Telephone Encounter (Signed)
Spoke to patient he stated for the past 3 days he has felt weak,no energy.No chest pain.No sob. Feels faint at times.B/P 149/73,159/61.Pulse 60,55.Stated last week he increased Losartan back to 100 mg daily due to B/P elevated.Appointment scheduled with Laurann Montana NP 8/24 at 10:00 am at [facility] location.Advised if he feels worse he will need to go to ED to be evaluated.I will make Dr.Jordan aware.

## 2020-09-27 NOTE — Telephone Encounter (Signed)
    STAT if patient feels like he/she is going to faint   Are you dizzy now? No  Do you feel faint or have you passed out? Feel faint  Do you have any other symptoms? SOB  Have you checked your HR and BP (record if available)? BP 159/61 HR 55, o2 96-66, resting HR 57-67  Pt said for the last 2 days been feeling faint and dizzy specially when he try to stand up.

## 2020-09-30 NOTE — Telephone Encounter (Signed)
Agree. Dizziness has been a chronic problem without clear cardiac or Neuro etiology  Nelani Schmelzle [name] MD, [facility]

## 2020-10-01 ENCOUNTER — Telehealth: Payer: Self-pay | Admitting: Cardiology

## 2020-10-01 NOTE — Telephone Encounter (Signed)
Received a call from patient.Stated he feels like he is going to faint.He is weak and sob.Increased swelling in both feet.Stated hard to concentrate.Advised to call 911 and go to ED to be evaluated.I will make Dr.Jordan aware.

## 2020-10-01 NOTE — Telephone Encounter (Signed)
Pt c/o Syncope: STAT if syncope occurred within 30 minutes and pt complains of lightheadedness High Priority if episode of passing out, completely, today or in last 24 hours   Did you pass out today? No but close   When is the last time you passed out? No    Has this occurred multiple times? No    Did you have any symptoms prior to passing out? Feeling faint, really lightheadedness, seeing double, trouble concentrating

## 2020-10-09 ENCOUNTER — Telehealth: Payer: Self-pay | Admitting: Cardiology

## 2020-10-09 NOTE — Telephone Encounter (Signed)
Patient called in complaining of weakness, dizziness, and hard time focusing Concerned coming from medications Taking following  Lasix 40 mg daily for 7 days HCTZ 12.5 mg daily  Imdur 30 mg daily  Xarelto 20 mg daily  Losartan 100 mg daily   Blood pressures today  AM 122/65 HR 75 10 AM 116/61 HR 68 1:30 PM 104/56 HR 76 4:45 PM 136/69 HR 70 after walking around   O2 96-97 %   Stated he was in hospital for few days last week and is feeling worse over last 2 days   Appointment with Overton Mam NP 8/24  Will forward to Dr [name] for review

## 2020-10-09 NOTE — Telephone Encounter (Signed)
Reviewed hospital visit. Etiology for dizziness and imbalance are unclear and chronic. Keep appointment with Urban Gibson next week to assess.  Tye Juarez [name] MD, [facility]

## 2020-10-09 NOTE — Telephone Encounter (Signed)
  Pt c/o medication issue:  1. Name of Medication:  Furosemide 40 mg  Isosorbide mononitrate 30 mg  Hydrochlorothiazide 25 mg  Losartan 100 mg  2. How are you currently taking this medication (dosage and times per day)? 1 tablet daily for all  3. Are you having a reaction (difficulty breathing--STAT)? no  4. What is your medication issue? Patient states he has been feeling lightheaded and dizzy since last week. He states he went to the hospital last week due to these symptoms and swelling. He states the dizziness has been getting worse and he can barely get up. He states he also still has pressure in his chest and it started last week as well. Patient states he feels like he may pass out. He states his BP today was 122/65/75 at 8 am, 116/61/68 10 am, 104/56/76, 136/69/70 now after walking.

## 2020-10-09 NOTE — Telephone Encounter (Signed)
Spoke to patient Dr.Jordan's advice given.Advised to keep appointment with Laurann Montana NP as planned.

## 2020-10-10 NOTE — Telephone Encounter (Signed)
Returned call to Pt.  Repeat check of BP 129/55 pulse 81  Pt has completed prescription for lasix-advised him to be sure he was not taking anymore.  Pt complaining of lightheadedness, which is chronic.  Advised Pt to try splitting his medications into HCTZ in the AM with either his Imdur or losartan and then take the other at night time.  He will give this a try.    Encouraged Pt to keep his follow up next week.

## 2020-10-10 NOTE — Telephone Encounter (Signed)
   Pt said today he feels so sick, he feels lightheadedness, nauseated and can't focus. He said he did took all of his meds today and hi BP is running low. 100/52 with HR 72. His friend looked at him today and his friend said he does look pale and need to call his doctor again.

## 2020-10-15 NOTE — Progress Notes (Signed)
Office Visit    Patient Name: Michael Whitney Date of Encounter: 10/16/2020  PCP:  Loraine Leriche., MD   [place]  Cardiologist:  Peter [name], MD  Advanced Practice Provider:  No care team member to display Electrophysiologist:  Cristopher Peru, MD     Chief Complaint    Michael STEININGER is a 70 y.o. 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), OSA presents today for follow-up after ED visit  Past Medical History    Past Medical History:  Diagnosis Date   Atrial fibrillation ([place])    Cataract    Diabetes mellitus without complication ([place])    Hypertension    Seizures ([place])    Past Surgical History:  Procedure Laterality Date   CATARACT EXTRACTION     PVC ABLATION N/A 07/14/2017   Procedure: PVC ABLATION;  Surgeon: Evans Lance, MD;  Location: [place] [facility];  Service: Cardiovascular;  Laterality: N/A;   TONSILLECTOMY     VARICOSE VEIN SURGERY     Allergies  No Known Allergies  History of Present Illness    Michael Whitney is a 70 y.o. 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 [name].  Diagnosed with atrial fibrillation in 2019.  EP study by Dr. Lovena Le revealing PVCs arising near His bundle and ablation was not recommended due to potential for heart block.  He has been maintained on a rate control therapy with anticoagulation.  He has longstanding history of chronic dizziness.  Has been unrelated to changes in blood pressure or heart rate.  Has been thought to be due to vertigo and has followed with neurology.  He has completed some element of physical therapy with minimal  improvement.  ED visit at [facility] 10/01/2020.  He presented with feeling weak, dyspnea on exertion, chest pain, dizziness, lower extremity edema.  Troponin slightly elevated, BNP slightly elevated. TTE performed with bilateral atria severely dilated, LVEF 55 to 60%, RV moderately to severely dilated, RVSF moderately reduced, mild to moderate MR, moderate TR, mild to moderate pulmonary hypertension.  He was recommended for diuresis.  He was also started on Protonix 40 mg twice daily for 30 days and then decrease to once daily after 1 month at follow-up with PCP.  His hydrochlorothiazide was increased to 25 mg daily.  Imdur 30 mg initiated.  Discharged on 7 day course of Lasix. He was recommended for consideration of right heart cath as an outpatient.  Lab work 10/02/20 creatnine 1.08, GFR 73, Hb 16.7  He presents today for follow-up Tells me the dizziness feels as if he is tilting forward and off equilibrium. This has been an ongoing problem for years and he is understandably frustrated by lack of answers regarding cause. It is more noticeable with position changes of the head. He went to his eye doctor a couple weeks ago as he feels as if his eyes are going different directions. He was recommended for eye drops due to dryness. No change since eye drops. Notes weight gain though is not eating much. Typical diet detailed below. He wories his thyroid is contributory. Notes no recurrent lower extremity edema. He has reduced back to HCTZ 12.'5mg'$  QD by primary care. Reports a persistent pressure in  his midsternal region both at rest and with activity. Endorses shortness of breath at rest and with activity.   Typical meals: Breakfast of poached eggs, yogurt. Lunch of protein shake, fruit. Dinner salad with chicken or vegetable stir fry with chicken (does not use soy sauce). Evening snack of yogurt. Only drinks water throughout the day.   EKGs/Labs/Other Studies Reviewed:   The following studies  were reviewed today:  Echo 10/02/20 The left ventricular size is normal. There is severe concentric left  ventricular hypertrophy. The left ventricular wall motion is normal.  Left ventricular systolic function is normal. LV ejection fraction =  55-60%.  Left ventricular filling pattern is pseudonormal. LAP is  indeterminate.  The right ventricle is moderate to severely dilated. The right  ventricular systolic function is moderately reduced.  The atria are severely dilated.  There is mild to moderate mitral regurgitation.  There is moderate tricuspid regurgitation.  Mild to moderate pulmonary hypertension.  Estimated right ventricular systolic pressure is 46 mmHg.  The IVC is dilated with an abnormal collapsibility index, this  suggestive of increased right atrial pressure.   Progression of RV changes and atrial dilation compared to 2020 study.   EKG:  EKG is ordered today.  The ekg ordered today demonstrates rate controlled atrial fibrillation 63bpm with stable RBBB.   Recent Labs: No results found for requested labs within last 8760 hours.  Recent Lipid Panel    Component Value Date/Time   CHOL 111 11/04/2012 1125   TRIG 104 11/04/2012 1125   HDL 35 (L) 11/04/2012 1125   CHOLHDL 3.2 11/04/2012 1125   VLDL 21 11/04/2012 1125   LDLCALC 55 11/04/2012 1125    Home Medications   Current Meds  Medication Sig   Accu-Chek FastClix Lancets MISC Use 1 lancet daily to test blood sugar (E11.9)   ascorbic acid (VITAMIN C) 1000 MG tablet Take by mouth.   azelastine (ASTELIN) 0.1 % nasal spray Place 1 spray into both nostrils 2 (two) times daily as needed for rhinitis. Use in each nostril as directed   BIOTIN PO Take 1 tablet by mouth daily.   Calcium Carb-Cholecalciferol (CALCIUM 600+D3 PO) Take 1 tablet by mouth daily.   Cholecalciferol (VITAMIN D-3) 25 MCG (1000 UT) CAPS Take by mouth.   hydrochlorothiazide (MICROZIDE) 12.5 MG capsule Take 1 capsule (12.5 mg total) by mouth daily.    losartan (COZAAR) 100 MG tablet Take 100 mg by mouth daily.   methimazole (TAPAZOLE) 5 MG tablet Take 1 tablet (5 mg total) by mouth every other day.   Multiple Vitamin (MULTI-VITAMIN) tablet Take by mouth.   Omega-3 Fatty Acids (FISH OIL PO) Take by mouth.   XARELTO 20 MG TABS tablet TAKE 1 TABLET(20 MG) BY MOUTH DAILY WITH SUPPER     Review of Systems    All other systems reviewed and are otherwise negative except as noted above.  Physical Exam    VS:  BP 120/70 (BP Location: Left Arm, Patient Position: Sitting, Cuff Size: Normal)   Pulse 63   Ht '6\' 3"'$  (1.905 m)   Wt 292 lb (132.5 kg)   BMI 36.50 kg/m  , BMI Body mass index is 36.5 kg/m.  Wt Readings from Last 3 Encounters:  10/16/20 292 lb (132.5 kg)  05/13/20 278 lb (126.1 kg)  11/17/19 281 lb (127.5 kg)     GEN: Well nourished, well developed, in no acute distress. HEENT: normal. Neck: Supple, no JVD, carotid bruits, or masses. Cardiac: irregularly irregular, no murmurs,  rubs, or gallops. No clubbing, cyanosis, edema.  Radials/PT 2+ and equal bilaterally.  Respiratory:  Respirations regular and unlabored, clear to auscultation bilaterally. GI: Soft, nontender, nondistended. MS: No deformity or atrophy. Skin: Warm and dry, no rash. Neuro:  Strength and sensation are intact. Psych: Normal affect.  Assessment & Plan    LE edema / DOE / Chest pain in adult / Pulmonary hypertension/ Valvular heart disease -Echo 09/2020 with mild to moderate pulmonary hypertension, normal LVEF, mild to moderate MR, moderate TR.  LE edema has not resolved but notes weight gain, abdominal bloating, and weight gain. Volume overloaded on exam. Stop HCTZ, start Lasix '20mg'$  daily. During recent admission at [facility] was recommended to consider right heart catheterization for further classification of volume status and pulmonary pressures.  He notes midsternal chest pain that is present at rest and with activity which is overall  atypical for angina though as we are pursuing RHC will perform LHC at the same time to rule out coronary disease.  The risks [stroke (1 in 1000), death (1 in 75), kidney failure [usually temporary] (1 in 500), bleeding (1 in 200), allergic reaction [possibly serious] (1 in 200)], benefits (diagnostic support and management of coronary artery disease) and alternatives of a cardiac catheterization were discussed in detail with Mr. Stuedemann and he is willing to proceed. BMP, CBC ordered.   OSA - CPAP compliance encouraged.   DM2 - Continue to follow with PCP.   Permanent atrial fibrillation /chronic anticoagulation - Rate controlled by EKG today.  Overall asymptomatic with no palpitations.  Not requiring AV nodal blocking agent.  Anticoagulated with Xarelto '20mg'$  QD due to CHADS2VASc of at least 4 (age, HTN, valvular heart disease, DM2).  Denies bleeding complications. Hold Xarelto 2 days prior to cardiac catheterization.   Hypertension - BP well controlled. Continue current antihypertensive regimen.    Hashimoto's thyroiditis - Continue to follow with PCP, endocrinology.   Dizziness-  Continue to follow with ENT due to possible vertigo. Was referred to neurology by primary care.  We discussed that valvular heart disease could be contributory to some lightheadedness but is not typically consistent with the type of abnormal equilibrium he is experiencing.  Disposition: Follow up  2 weeks after cardiac catheterization  with Dr. [name] or APP.  Signed, Loel Dubonnet, NP 10/16/2020, 10:20 AM [facility]

## 2020-10-15 NOTE — H&P (View-Only) (Signed)
Office Visit    Patient Name: Michael Whitney Date of Encounter: 10/16/2020  PCP:  Loraine Leriche., MD   [place]  Cardiologist:  Peter [name], MD  Advanced Practice Provider:  No care team member to display Electrophysiologist:  Cristopher Peru, MD     Chief Complaint    Michael Whitney is a 70 y.o. 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), OSA presents today for follow-up after ED visit  Past Medical History    Past Medical History:  Diagnosis Date   Atrial fibrillation ([place])    Cataract    Diabetes mellitus without complication ([place])    Hypertension    Seizures ([place])    Past Surgical History:  Procedure Laterality Date   CATARACT EXTRACTION     PVC ABLATION N/A 07/14/2017   Procedure: PVC ABLATION;  Surgeon: Evans Lance, MD;  Location: [facility];  Service: Cardiovascular;  Laterality: N/A;   TONSILLECTOMY     VARICOSE VEIN SURGERY     Allergies  No Known Allergies  History of Present Illness    Michael Whitney is a 70 y.o. 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 [name].  Diagnosed with atrial fibrillation in 2019.  EP study by Dr. Lovena Le revealing PVCs arising near His bundle and ablation was not recommended due to potential for heart block.  He has been maintained on a rate control therapy with anticoagulation.  He has longstanding history of chronic dizziness.  Has been unrelated to changes in blood pressure or heart rate.  Has been thought to be due to vertigo and has followed with neurology.  He has completed some element of physical therapy with minimal  improvement.  ED visit at [facility] 10/01/2020.  He presented with feeling weak, dyspnea on exertion, chest pain, dizziness, lower extremity edema.  Troponin slightly elevated, BNP slightly elevated. TTE performed with bilateral atria severely dilated, LVEF 55 to 60%, RV moderately to severely dilated, RVSF moderately reduced, mild to moderate MR, moderate TR, mild to moderate pulmonary hypertension.  He was recommended for diuresis.  He was also started on Protonix 40 mg twice daily for 30 days and then decrease to once daily after 1 month at follow-up with PCP.  His hydrochlorothiazide was increased to 25 mg daily.  Imdur 30 mg initiated.  Discharged on 7 day course of Lasix. He was recommended for consideration of right heart cath as an outpatient.  Lab work 10/02/20 creatnine 1.08, GFR 73, Hb 16.7  He presents today for follow-up Tells me the dizziness feels as if he is tilting forward and off equilibrium. This has been an ongoing problem for years and he is understandably frustrated by lack of answers regarding cause. It is more noticeable with position changes of the head. He went to his eye doctor a couple weeks ago as he feels as if his eyes are going different directions. He was recommended for eye drops due to dryness. No change since eye drops. Notes weight gain though is not eating much. Typical diet detailed below. He wories his thyroid is contributory. Notes no recurrent lower extremity edema. He has reduced back to HCTZ 12.'5mg'$  QD by primary care. Reports a persistent pressure in  his midsternal region both at rest and with activity. Endorses shortness of breath at rest and with activity.   Typical meals: Breakfast of poached eggs, yogurt. Lunch of protein shake, fruit. Dinner salad with chicken or vegetable stir fry with chicken (does not use soy sauce). Evening snack of yogurt. Only drinks water throughout the day.   EKGs/Labs/Other Studies Reviewed:   The following studies  were reviewed today:  Echo 10/02/20 The left ventricular size is normal. There is severe concentric left  ventricular hypertrophy. The left ventricular wall motion is normal.  Left ventricular systolic function is normal. LV ejection fraction =  55-60%.  Left ventricular filling pattern is pseudonormal. LAP is  indeterminate.  The right ventricle is moderate to severely dilated. The right  ventricular systolic function is moderately reduced.  The atria are severely dilated.  There is mild to moderate mitral regurgitation.  There is moderate tricuspid regurgitation.  Mild to moderate pulmonary hypertension.  Estimated right ventricular systolic pressure is 46 mmHg.  The IVC is dilated with an abnormal collapsibility index, this  suggestive of increased right atrial pressure.   Progression of RV changes and atrial dilation compared to 2020 study.   EKG:  EKG is ordered today.  The ekg ordered today demonstrates rate controlled atrial fibrillation 63bpm with stable RBBB.   Recent Labs: No results found for requested labs within last 8760 hours.  Recent Lipid Panel    Component Value Date/Time   CHOL 111 11/04/2012 1125   TRIG 104 11/04/2012 1125   HDL 35 (L) 11/04/2012 1125   CHOLHDL 3.2 11/04/2012 1125   VLDL 21 11/04/2012 1125   LDLCALC 55 11/04/2012 1125    Home Medications   Current Meds  Medication Sig   Accu-Chek FastClix Lancets MISC Use 1 lancet daily to test blood sugar (E11.9)   ascorbic acid (VITAMIN C) 1000 MG tablet Take by mouth.   azelastine (ASTELIN) 0.1 % nasal spray Place 1 spray into both nostrils 2 (two) times daily as needed for rhinitis. Use in each nostril as directed   BIOTIN PO Take 1 tablet by mouth daily.   Calcium Carb-Cholecalciferol (CALCIUM 600+D3 PO) Take 1 tablet by mouth daily.   Cholecalciferol (VITAMIN D-3) 25 MCG (1000 UT) CAPS Take by mouth.   hydrochlorothiazide (MICROZIDE) 12.5 MG capsule Take 1 capsule (12.5 mg total) by mouth daily.    losartan (COZAAR) 100 MG tablet Take 100 mg by mouth daily.   methimazole (TAPAZOLE) 5 MG tablet Take 1 tablet (5 mg total) by mouth every other day.   Multiple Vitamin (MULTI-VITAMIN) tablet Take by mouth.   Omega-3 Fatty Acids (FISH OIL PO) Take by mouth.   XARELTO 20 MG TABS tablet TAKE 1 TABLET(20 MG) BY MOUTH DAILY WITH SUPPER     Review of Systems    All other systems reviewed and are otherwise negative except as noted above.  Physical Exam    VS:  BP 120/70 (BP Location: Left Arm, Patient Position: Sitting, Cuff Size: Normal)   Pulse 63   Ht '6\' 3"'$  (1.905 m)   Wt 292 lb (132.5 kg)   BMI 36.50 kg/m  , BMI Body mass index is 36.5 kg/m.  Wt Readings from Last 3 Encounters:  10/16/20 292 lb (132.5 kg)  05/13/20 278 lb (126.1 kg)  11/17/19 281 lb (127.5 kg)     GEN: Well nourished, well developed, in no acute distress. HEENT: normal. Neck: Supple, no JVD, carotid bruits, or masses. Cardiac: irregularly irregular, no murmurs,  rubs, or gallops. No clubbing, cyanosis, edema.  Radials/PT 2+ and equal bilaterally.  Respiratory:  Respirations regular and unlabored, clear to auscultation bilaterally. GI: Soft, nontender, nondistended. MS: No deformity or atrophy. Skin: Warm and dry, no rash. Neuro:  Strength and sensation are intact. Psych: Normal affect.  Assessment & Plan    LE edema / DOE / Chest pain in adult / Pulmonary hypertension/ Valvular heart disease -Echo 09/2020 with mild to moderate pulmonary hypertension, normal LVEF, mild to moderate MR, moderate TR.  LE edema has not resolved but notes weight gain, abdominal bloating, and weight gain. Volume overloaded on exam. Stop HCTZ, start Lasix '20mg'$  daily. During recent admission at [facility] was recommended to consider right heart catheterization for further classification of volume status and pulmonary pressures.  He notes midsternal chest pain that is present at rest and with activity which is overall  atypical for angina though as we are pursuing RHC will perform LHC at the same time to rule out coronary disease.  The risks [stroke (1 in 1000), death (1 in 12), kidney failure [usually temporary] (1 in 500), bleeding (1 in 200), allergic reaction [possibly serious] (1 in 200)], benefits (diagnostic support and management of coronary artery disease) and alternatives of a cardiac catheterization were discussed in detail with Mr. Dicken and he is willing to proceed. BMP, CBC ordered.   OSA - CPAP compliance encouraged.   DM2 - Continue to follow with PCP.   Permanent atrial fibrillation /chronic anticoagulation - Rate controlled by EKG today.  Overall asymptomatic with no palpitations.  Not requiring AV nodal blocking agent.  Anticoagulated with Xarelto '20mg'$  QD due to CHADS2VASc of at least 4 (age, HTN, valvular heart disease, DM2).  Denies bleeding complications. Hold Xarelto 2 days prior to cardiac catheterization.   Hypertension - BP well controlled. Continue current antihypertensive regimen.    Hashimoto's thyroiditis - Continue to follow with PCP, endocrinology.   Dizziness-  Continue to follow with ENT due to possible vertigo. Was referred to neurology by primary care.  We discussed that valvular heart disease could be contributory to some lightheadedness but is not typically consistent with the type of abnormal equilibrium he is experiencing.  Disposition: Follow up  2 weeks after cardiac catheterization  with Dr. [name] or APP.  Signed, Loel Dubonnet, NP 10/16/2020, 10:20 AM [facility]

## 2020-10-16 ENCOUNTER — Ambulatory Visit (HOSPITAL_BASED_OUTPATIENT_CLINIC_OR_DEPARTMENT_OTHER): Payer: Medicare Other | Admitting: Family

## 2020-10-16 ENCOUNTER — Encounter (HOSPITAL_BASED_OUTPATIENT_CLINIC_OR_DEPARTMENT_OTHER): Payer: Self-pay | Admitting: Family

## 2020-10-16 ENCOUNTER — Other Ambulatory Visit: Payer: Self-pay

## 2020-10-16 VITALS — BP 120/70 | HR 63 | Ht 75.0 in | Wt 292.0 lb

## 2020-10-16 DIAGNOSIS — Z7901 Long term (current) use of anticoagulants: Secondary | ICD-10-CM

## 2020-10-16 DIAGNOSIS — G4733 Obstructive sleep apnea (adult) (pediatric): Secondary | ICD-10-CM | POA: Diagnosis not present

## 2020-10-16 DIAGNOSIS — I10 Essential (primary) hypertension: Secondary | ICD-10-CM

## 2020-10-16 DIAGNOSIS — R0600 Dyspnea, unspecified: Secondary | ICD-10-CM

## 2020-10-16 DIAGNOSIS — R42 Dizziness and giddiness: Secondary | ICD-10-CM

## 2020-10-16 DIAGNOSIS — I272 Pulmonary hypertension, unspecified: Secondary | ICD-10-CM | POA: Diagnosis not present

## 2020-10-16 DIAGNOSIS — I4821 Permanent atrial fibrillation: Secondary | ICD-10-CM

## 2020-10-16 DIAGNOSIS — R0609 Other forms of dyspnea: Secondary | ICD-10-CM

## 2020-10-16 DIAGNOSIS — R079 Chest pain, unspecified: Secondary | ICD-10-CM

## 2020-10-16 DIAGNOSIS — I38 Endocarditis, valve unspecified: Secondary | ICD-10-CM | POA: Diagnosis not present

## 2020-10-16 MED ORDER — FUROSEMIDE 20 MG PO TABS: 20.0000 mg | ORAL_TABLET | Freq: Every day | ORAL | 2 refills | Status: DC

## 2020-10-16 NOTE — Patient Instructions (Signed)
Medication Instructions:  Your physician has recommended you make the following change in your medication:   STOP Hydrochlorothiazide  START Furosemide (Lasix) one '20mg'$  tablet daily   *If you need a refill on your cardiac medications before your next appointment, please call your pharmacy*   Lab Work: Your physician recommends that you return for lab work for [facility], CBC at [facility] this Whitney  If you have labs (blood work) drawn today and your tests are completely normal, you will receive your results only by: [employer] (if you have [place]) OR A paper copy in the mail If you have any lab test that is abnormal or we need to change your treatment, we will call you to review the results.   Testing/Procedures: Your physician has requested that you have a cardiac catheterization. Cardiac catheterization is used to diagnose and/or treat various heart conditions. Doctors may recommend this procedure for a number of different reasons. The most common reason is to evaluate chest pain. Chest pain can be a symptom of coronary artery disease (CAD), and cardiac catheterization can show whether plaque is narrowing or blocking your heart's arteries. This procedure is also used to evaluate the valves, as well as measure the blood flow and oxygen levels in different parts of your heart. Please follow instruction sheet, as given.    Follow-Up: At [facility], you and your health needs are our priority.  As part of our continuing mission to provide you with exceptional heart care, we have created designated Provider Care Teams.  These Care Teams include your primary Cardiologist (physician) and Advanced Practice Providers (APPs -  Physician Assistants and Nurse Practitioners) who all work together to provide you with the care you need, when you need it.  We recommend signing up for the patient portal called "MyChart".  Sign up information is provided on this After Visit Summary.  MyChart is used to  connect with patients for Virtual Visits (Telemedicine).  Patients are able to view lab/test results, encounter notes, upcoming appointments, etc.  Non-urgent messages can be sent to your provider as well.   To learn more about what you can do with MyChart, go to [email or web address].    Your next appointment:   2 weeks after cardiac catheterization  The format for your next appointment:   In Person  Provider:   You may see Peter [name], MD or one of the following Advanced Practice Providers on your designated Care Team:   Almyra Deforest, PA-C Fabian Sharp, PA-C or  [place], [place] Loel Dubonnet, NP    Other Instructions   Recommend weighing daily and keeping a log. Please call our office if you have weight gain of 2 pounds overnight or 5 pounds in 1 Whitney.   Date  Time Weight                                            [place] [address] Dept: [phone]  Michael Whitney  10/16/2020  You are scheduled for a Cardiac Catheterization on Thursday, September 1 with Dr. Peter [name].  1. Please arrive at the [facility] (Main Entrance A) at [facility]: [address] [place], [place] [address] at 5:30 AM (This time is two hours before your procedure to ensure your preparation). Free valet parking service is  available.   Special note: Every effort is made to have your procedure done on time. Please understand that emergencies sometimes delay scheduled procedures.  2. Diet: Do not eat solid foods after midnight.  The patient may have clear liquids until 5am upon the day of the procedure.  3. Labs: You will need to have blood drawn one day 10/16/20-10/18/20 at [place] do not need to be fasting.  4. Medication instructions in preparation for your procedure:   Contrast Allergy: No  Stop taking Xarelto (Rivaroxaban) on Tuesday, August  30.  Stop taking, Lasix (Furosemide)  Wednesday, August 31,  On the morning of your procedure, take your Aspirin and any morning medicines NOT listed above.  You may use sips of water.  5. Plan for one night stay--bring personal belongings. 6. Bring a current list of your medications and current insurance cards. 7. You MUST have a responsible person to drive you home. 8. Someone MUST be with you the first 24 hours after you arrive home or your discharge will be delayed. 9. Please wear clothes that are easy to get on and off and wear slip-on shoes.  Thank you for allowing [place] to care for you!   -- [facility]

## 2020-10-18 LAB — CBC
Hematocrit: 48.4 % (ref 37.5–51.0)
Hemoglobin: 16.3 g/dL (ref 13.0–17.7)
MCH: 32.5 pg (ref 26.6–33.0)
MCHC: 33.7 g/dL (ref 31.5–35.7)
MCV: 97 fL (ref 79–97)
Platelets: 144 10*3/uL — ABNORMAL LOW (ref 150–450)
RBC: 5.01 x10E6/uL (ref 4.14–5.80)
RDW: 12.6 % (ref 11.6–15.4)
WBC: 5.8 10*3/uL (ref 3.4–10.8)

## 2020-10-18 LAB — BASIC METABOLIC PANEL
BUN/Creatinine Ratio: 21 (ref 10–24)
BUN: 26 mg/dL (ref 8–27)
CO2: 24 mmol/L (ref 20–29)
Calcium: 9.5 mg/dL (ref 8.6–10.2)
Chloride: 107 mmol/L — ABNORMAL HIGH (ref 96–106)
Creatinine, Ser: 1.26 mg/dL (ref 0.76–1.27)
Glucose: 133 mg/dL — ABNORMAL HIGH (ref 65–99)
Potassium: 6.3 mmol/L — ABNORMAL HIGH (ref 3.5–5.2)
Sodium: 144 mmol/L (ref 134–144)
eGFR: 61 mL/min/{1.73_m2} (ref >59)

## 2020-10-21 ENCOUNTER — Telehealth: Payer: Self-pay | Admitting: Cardiology

## 2020-10-21 ENCOUNTER — Other Ambulatory Visit: Payer: Self-pay

## 2020-10-21 DIAGNOSIS — R42 Dizziness and giddiness: Secondary | ICD-10-CM

## 2020-10-21 DIAGNOSIS — E875 Hyperkalemia: Secondary | ICD-10-CM

## 2020-10-21 DIAGNOSIS — R531 Weakness: Secondary | ICD-10-CM

## 2020-10-21 NOTE — Telephone Encounter (Signed)
Called Mr. Defreese to discuss. He is very hesitant regarding additional lab work. Discussed the importance of repeating potassium level prior to his cardiac catheterization. He declines to come to office to check due to distance. He declines to go to [facility] as he was frustrated by his wait time last time he was there. He asks if he can go to his primary care provider. Contacted PCP office who were agreeable to collect BMP but notes patient will need to call to schedule appointment. I have made Mr. Vlahos aware. I will fax orders as well as this note to PCP for their review.   Loel Dubonnet, NP

## 2020-10-21 NOTE — Telephone Encounter (Signed)
Patient is following up--states he contacted his PCP to schedule lab work, but he was told they didn't receive it and they don't know anything about an order for lab work. He would like to know if someone can call again.  Dr. Maralyn Sago' office #: [phone]

## 2020-10-21 NOTE — Telephone Encounter (Signed)
Michael Whitney from [facility] calling in regards to the patient's scheduled procedure for a right and left heart cath 9/1. She says there is no prior authorization on file and this CPT code does require it. Phone: [phone]

## 2020-10-21 NOTE — Telephone Encounter (Signed)
Returned call to [place] with [place] left message on personal voice mail a message was sent to our pre cert team to pre cert cath scheduled 9/1.

## 2020-10-21 NOTE — Telephone Encounter (Signed)
Called Dr Stefani Dama office.  - per  office  they need Dr Amalia Hailey to  give order for BMP  to be done and then lab can be schedule.   Rn Camera operator. Will notify patient.

## 2020-10-21 NOTE — Telephone Encounter (Signed)
Michael Whitney is calling stating he is returning a call he just received from the office. Unable to find documentation of who called or why. Patient stated he does not want to come in for labs today. States he had labs earlier this month and his potassium level was 4.0. He reports he also eats banana's daily which could've been the cause of it being elevated. Please advise.

## 2020-10-21 NOTE — Telephone Encounter (Signed)
Spoke to patient stated he will have potassium repeated tomorrow 8/30 at [facility] in [place].Advised order has been placed.

## 2020-10-21 NOTE — Telephone Encounter (Signed)
Called spoke to patient . Informed him primary will be giving him a call to schedule  BMP lab. Patient verbalized understanding

## 2020-10-22 NOTE — Telephone Encounter (Signed)
[place] is calling back stating that she just spoke with the insurance company and the PA was received, but the location of where the procedure will be performed is not matching and the correct information is needed for the PA to approve. Please reach out to the insurance to correct this and then return Brittany's call to update her on the status.

## 2020-10-22 NOTE — Telephone Encounter (Signed)
Message routed to pre-cert team and [facility] LPN

## 2020-10-22 NOTE — Telephone Encounter (Signed)
Hepler, Olegario Messier, Lexine Baton, RN; Pugh, Lucianne Muss D, LPN Caller: Unspecified Wilburn Mylar, 11:46 AM) I have contacted [place] regarding authorization. Josem Kaufmann is on file and correct. I advised her to reach out to me directly for prior auth requests. Thank you.

## 2020-10-23 ENCOUNTER — Telehealth (HOSPITAL_BASED_OUTPATIENT_CLINIC_OR_DEPARTMENT_OTHER): Payer: Self-pay | Admitting: Family

## 2020-10-23 ENCOUNTER — Telehealth: Payer: Self-pay | Admitting: *Deleted

## 2020-10-23 NOTE — Telephone Encounter (Signed)
Received note from PCP with [facility] with Labs.  10/21/20 Na 141, K 4.2, Cl 103, BUN 28, GFR 64, creatinine 1.2.  Anticipate previous hyperkalemia was lab error. Proceed with cardiac cath as scheduled.   Loel Dubonnet, NP

## 2020-10-23 NOTE — Telephone Encounter (Signed)
Cardiac catheterization scheduled at [facility] for: Thursday October 24, 2020 7:30 AM [facility] Main Entrance A [facility]) at: 5:30 AM   No solid food after midnight prior to cath, clear liquids until 5 AM day of procedure.  Medication instructions: Hold: -Xarelto-none 10/22/20 until post procedure -Lasix - AM of procedure   Except hold medications morning medications can be taken pre-cath with sips of water including aspirin 81 mg.    Confirmed patient has responsible adult to drive home post procedure and be with patient first 24 hours after arriving home.  Patients are allowed one visitor in the waiting room during the time they are at the hospital for their procedure. Both patient and visitor must wear a mask once they enter the hospital.   Patient reports does not currently have any symptoms concerning for COVID-19 and no household members with COVID-19 like illness.                          Reviewed procedure/mask/visitor instructions with patient.

## 2020-10-24 ENCOUNTER — Encounter (HOSPITAL_COMMUNITY): Payer: Self-pay | Admitting: Cardiology

## 2020-10-24 ENCOUNTER — Encounter (HOSPITAL_COMMUNITY)
Admission: RE | Disposition: A | Discharge status: Home / Self Care | Payer: Self-pay | Source: Home / Self Care | Attending: Cardiology

## 2020-10-24 ENCOUNTER — Ambulatory Visit (HOSPITAL_COMMUNITY)
Admission: RE | Admit: 2020-10-24 | Discharge: 2020-10-24 | Disposition: A | Discharge status: Home / Self Care | Payer: Medicare Other | Attending: Cardiology | Admitting: Cardiology

## 2020-10-24 ENCOUNTER — Other Ambulatory Visit: Payer: Self-pay

## 2020-10-24 DIAGNOSIS — I4821 Permanent atrial fibrillation: Secondary | ICD-10-CM | POA: Diagnosis not present

## 2020-10-24 DIAGNOSIS — G4733 Obstructive sleep apnea (adult) (pediatric): Secondary | ICD-10-CM | POA: Insufficient documentation

## 2020-10-24 DIAGNOSIS — I10 Essential (primary) hypertension: Secondary | ICD-10-CM | POA: Diagnosis present

## 2020-10-24 DIAGNOSIS — I272 Pulmonary hypertension, unspecified: Secondary | ICD-10-CM | POA: Diagnosis not present

## 2020-10-24 DIAGNOSIS — E118 Type 2 diabetes mellitus with unspecified complications: Secondary | ICD-10-CM

## 2020-10-24 DIAGNOSIS — I2510 Atherosclerotic heart disease of native coronary artery without angina pectoris: Secondary | ICD-10-CM | POA: Diagnosis not present

## 2020-10-24 DIAGNOSIS — R42 Dizziness and giddiness: Secondary | ICD-10-CM

## 2020-10-24 DIAGNOSIS — I493 Ventricular premature depolarization: Secondary | ICD-10-CM | POA: Diagnosis present

## 2020-10-24 DIAGNOSIS — R079 Chest pain, unspecified: Secondary | ICD-10-CM | POA: Diagnosis present

## 2020-10-24 DIAGNOSIS — Z7901 Long term (current) use of anticoagulants: Secondary | ICD-10-CM | POA: Insufficient documentation

## 2020-10-24 DIAGNOSIS — E063 Autoimmune thyroiditis: Secondary | ICD-10-CM | POA: Diagnosis not present

## 2020-10-24 DIAGNOSIS — E119 Type 2 diabetes mellitus without complications: Secondary | ICD-10-CM | POA: Insufficient documentation

## 2020-10-24 DIAGNOSIS — Z79899 Other long term (current) drug therapy: Secondary | ICD-10-CM | POA: Insufficient documentation

## 2020-10-24 DIAGNOSIS — E0590 Thyrotoxicosis, unspecified without thyrotoxic crisis or storm: Secondary | ICD-10-CM | POA: Diagnosis present

## 2020-10-24 DIAGNOSIS — I4819 Other persistent atrial fibrillation: Secondary | ICD-10-CM | POA: Diagnosis present

## 2020-10-24 DIAGNOSIS — I5032 Chronic diastolic (congestive) heart failure: Secondary | ICD-10-CM | POA: Diagnosis present

## 2020-10-24 HISTORY — PX: RIGHT/LEFT HEART CATH AND CORONARY ANGIOGRAPHY: CATH118266

## 2020-10-24 LAB — POCT I-STAT EG7
Acid-Base Excess: 0 mmol/L (ref 0.0–2.0)
Acid-base deficit: 1 mmol/L (ref 0.0–2.0)
Bicarbonate: 24.9 mmol/L (ref 20.0–28.0)
Bicarbonate: 26 mmol/L (ref 20.0–28.0)
Calcium, Ion: 1.18 mmol/L (ref 1.15–1.40)
Calcium, Ion: 1.16 mmol/L (ref 1.15–1.40)
HCT: 46 % (ref 39.0–52.0)
HCT: 46 % (ref 39.0–52.0)
Hemoglobin: 15.6 g/dL (ref 13.0–17.0)
Hemoglobin: 15.6 g/dL (ref 13.0–17.0)
O2 Saturation: 68 %
O2 Saturation: 69 %
Potassium: 4.5 mmol/L (ref 3.5–5.1)
Potassium: 4.5 mmol/L (ref 3.5–5.1)
Sodium: 142 mmol/L (ref 135–145)
Sodium: 141 mmol/L (ref 135–145)
TCO2: 26 mmol/L (ref 22–32)
TCO2: 27 mmol/L (ref 22–32)
pCO2, Ven: 45.9 mmHg (ref 44.0–60.0)
pCO2, Ven: 47.3 mmHg (ref 44.0–60.0)
pH, Ven: 7.343 (ref 7.250–7.430)
pH, Ven: 7.347 (ref 7.250–7.430)
pO2, Ven: 38 mmHg (ref 32.0–45.0)
pO2, Ven: 38 mmHg (ref 32.0–45.0)

## 2020-10-24 LAB — POCT I-STAT 7, (LYTES, BLD GAS, ICA,H+H)
Acid-base deficit: 1 mmol/L (ref 0.0–2.0)
Bicarbonate: 24.1 mmol/L (ref 20.0–28.0)
Calcium, Ion: 1.17 mmol/L (ref 1.15–1.40)
HCT: 45 % (ref 39.0–52.0)
Hemoglobin: 15.3 g/dL (ref 13.0–17.0)
O2 Saturation: 99 %
Potassium: 4.5 mmol/L (ref 3.5–5.1)
Sodium: 140 mmol/L (ref 135–145)
TCO2: 25 mmol/L (ref 22–32)
pCO2 arterial: 41.4 mmHg (ref 32.0–48.0)
pH, Arterial: 7.372 (ref 7.350–7.450)
pO2, Arterial: 150 mmHg — ABNORMAL HIGH (ref 83.0–108.0)

## 2020-10-24 SURGERY — RIGHT/LEFT HEART CATH AND CORONARY ANGIOGRAPHY
Anesthesia: LOCAL

## 2020-10-24 MED ORDER — FENTANYL CITRATE (PF) 100 MCG/2ML IJ SOLN
INTRAMUSCULAR | Status: DC | PRN
  Administered 2020-10-24: 25 ug via INTRAVENOUS

## 2020-10-24 MED ORDER — MIDAZOLAM HCL 2 MG/2ML IJ SOLN
INTRAMUSCULAR | Status: DC | PRN
  Administered 2020-10-24: 1 mg via INTRAVENOUS

## 2020-10-24 MED ORDER — ACETAMINOPHEN 325 MG PO TABS: 650.0000 mg | ORAL_TABLET | ORAL | Status: DC | PRN

## 2020-10-24 MED ORDER — ONDANSETRON HCL 4 MG/2ML IJ SOLN: 4.0000 mg | Freq: Four times a day (QID) | INTRAMUSCULAR | Status: DC | PRN

## 2020-10-24 MED ORDER — VERAPAMIL HCL 2.5 MG/ML IV SOLN
INTRAVENOUS | Status: AC
  Filled 2020-10-24: qty 2

## 2020-10-24 MED ORDER — VERAPAMIL HCL 2.5 MG/ML IV SOLN
INTRAVENOUS | Status: DC | PRN
  Administered 2020-10-24: 10 mL via INTRA_ARTERIAL

## 2020-10-24 MED ORDER — SODIUM CHLORIDE 0.9 % IV SOLN: 250.0000 mL | INTRAVENOUS | Status: DC | PRN

## 2020-10-24 MED ORDER — HEPARIN (PORCINE) IN NACL 1000-0.9 UT/500ML-% IV SOLN
INTRAVENOUS | Status: AC
  Filled 2020-10-24: qty 1000

## 2020-10-24 MED ORDER — SODIUM CHLORIDE 0.9% FLUSH: 3.0000 mL | INTRAVENOUS | Status: DC | PRN

## 2020-10-24 MED ORDER — LIDOCAINE HCL (PF) 1 % IJ SOLN
INTRAMUSCULAR | Status: AC
  Filled 2020-10-24: qty 30

## 2020-10-24 MED ORDER — IOHEXOL 350 MG/ML SOLN
INTRAVENOUS | Status: DC | PRN
  Administered 2020-10-24: 45 mL

## 2020-10-24 MED ORDER — SODIUM CHLORIDE 0.9% FLUSH: 3.0000 mL | Freq: Two times a day (BID) | INTRAVENOUS | Status: DC

## 2020-10-24 MED ORDER — FENTANYL CITRATE (PF) 100 MCG/2ML IJ SOLN
INTRAMUSCULAR | Status: AC
  Filled 2020-10-24: qty 2

## 2020-10-24 MED ORDER — MIDAZOLAM HCL 2 MG/2ML IJ SOLN
INTRAMUSCULAR | Status: AC
  Filled 2020-10-24: qty 2

## 2020-10-24 MED ORDER — SODIUM CHLORIDE 0.9 % WEIGHT BASED INFUSION: 1.0000 mL/kg/h | INTRAVENOUS | Status: AC

## 2020-10-24 MED ORDER — ASPIRIN 81 MG PO CHEW: 81.0000 mg | CHEWABLE_TABLET | ORAL | Status: DC

## 2020-10-24 MED ORDER — HEPARIN (PORCINE) IN NACL 1000-0.9 UT/500ML-% IV SOLN
INTRAVENOUS | Status: DC | PRN
  Administered 2020-10-24 (×2): 500 mL

## 2020-10-24 MED ORDER — HEPARIN SODIUM (PORCINE) 1000 UNIT/ML IJ SOLN
INTRAMUSCULAR | Status: AC
  Filled 2020-10-24: qty 1

## 2020-10-24 MED ORDER — SODIUM CHLORIDE 0.9 % IV SOLN: INTRAVENOUS | Status: DC

## 2020-10-24 MED ORDER — LIDOCAINE HCL (PF) 1 % IJ SOLN
INTRAMUSCULAR | Status: DC | PRN
  Administered 2020-10-24: 2 mL

## 2020-10-24 MED ORDER — HEPARIN SODIUM (PORCINE) 1000 UNIT/ML IJ SOLN
INTRAMUSCULAR | Status: DC | PRN
  Administered 2020-10-24: 6000 [IU] via INTRAVENOUS

## 2020-10-24 SURGICAL SUPPLY — 11 items

## 2020-10-24 NOTE — Discharge Instructions (Addendum)
May resume Xarelto this evening Drink plenty of fluid for 48 hours and keep wrist elevated at heart level for 24 hours  Radial Site Care   This sheet gives you information about how to care for yourself after your procedure. Your health care provider may also give you more specific instructions. If you have problems or questions, contact your health care provider. What can I expect after the procedure? After the procedure, it is common to have: Bruising and tenderness at the catheter insertion area. Follow these instructions at home: Medicines Take over-the-counter and prescription medicines only as told by your health care provider. Insertion site care Follow instructions from your health care provider about how to take care of your insertion site. Make sure you: Wash your hands with soap and water before you change your bandage (dressing). If soap and water are not available, use hand sanitizer. remove your dressing as told by your health care provider. In 24 hours Check your insertion site every day for signs of infection. Check for: Redness, swelling, or pain. Fluid or blood. Pus or a bad smell. Warmth. Do not take baths, swim, or use a hot tub until your health care provider approves. You may shower 24-48 hours after the procedure, or as directed by your health care provider. Remove the dressing and gently wash the site with plain soap and water. Pat the area dry with a clean towel. Do not rub the site. That could cause bleeding. Do not apply powder or lotion to the site. Activity   For 24 hours after the procedure, or as directed by your health care provider: Do not flex or bend the affected arm. Do not push or pull heavy objects with the affected arm. Do not drive yourself home from the hospital or clinic. You may drive 24 hours after the procedure unless your health care provider tells you not to. Do not operate machinery or power tools. Do not lift anything that is  heavier than 10 lb (4.5 kg), or the limit that you are told, until your health care provider says that it is safe. For 4 days Ask your health care provider when it is okay to: Return to work or school. Resume usual physical activities or sports. Resume sexual activity. General instructions If the catheter site starts to bleed, raise your arm and put firm pressure on the site. If the bleeding does not stop, get help right away. This is a medical emergency. If you went home on the same day as your procedure, a responsible adult should be with you for the first 24 hours after you arrive home. Keep all follow-up visits as told by your health care provider. This is important. Contact a health care provider if: You have a fever. You have redness, swelling, or yellow drainage around your insertion site. Get help right away if: You have unusual pain at the radial site. The catheter insertion area swells very fast. The insertion area is bleeding, and the bleeding does not stop when you hold steady pressure on the area. Your arm or hand becomes pale, cool, tingly, or numb. These symptoms may represent a serious problem that is an emergency. Do not wait to see if the symptoms will go away. Get medical help right away. Call your local emergency services (911 in the [place] Do not drive yourself to the hospital. Summary After the procedure, it is common to have bruising and tenderness at the site. Follow instructions from your health care provider about how to  take care of your radial site wound. Check the wound every day for signs of infection. Do not lift anything that is heavier than 10 lb (4.5 kg), or the limit that you are told, until your health care provider says that it is safe. This information is not intended to replace advice given to you by your health care provider. Make sure you discuss any questions you have with your health care provider. Document Revised: 03/17/2017 Document Reviewed:  03/17/2017 Elsevier Patient Education  2020 [employer].

## 2020-10-24 NOTE — Interval H&P Note (Signed)
History and Physical Interval Note:  10/24/2020 7:14 AM  Michael Whitney  has presented today for surgery, with the diagnosis of chest pain - hp.  The various methods of treatment have been discussed with the patient and family. After consideration of risks, benefits and other options for treatment, the patient has consented to  Procedure(s): RIGHT/LEFT HEART CATH AND CORONARY ANGIOGRAPHY (N/A) as a surgical intervention.  The patient's history has been reviewed, patient examined, no change in status, stable for surgery.  I have reviewed the patient's chart and labs.  Questions were answered to the patient's satisfaction.   Cath Lab Visit (complete for each Cath Lab visit)  Clinical Evaluation Leading to the Procedure:   ACS: No.  Non-ACS:    Anginal Classification: CCS II  Anti-ischemic medical therapy: No Therapy  Non-Invasive Test Results: No non-invasive testing performed  Prior CABG: No previous CABG        [name] [facility] 10/24/2020 7:14 AM

## 2020-10-24 NOTE — Progress Notes (Signed)
Patient and son was given discharge instructions. Both verbalized understanding. 

## 2020-10-25 ENCOUNTER — Ambulatory Visit: Payer: Medicare Other | Admitting: Medical

## 2020-11-04 ENCOUNTER — Telehealth: Payer: Self-pay | Admitting: Family

## 2020-11-04 NOTE — Telephone Encounter (Signed)
Patient calling to request appointment 9/15 be virtual due to feeling dizzy and not sure about driving.

## 2020-11-04 NOTE — Telephone Encounter (Signed)
Spoke with pt and he denies any issues with cath site.  Switched appt to MyChart VV and advised someone will be calling prior to get vitals from him and then they will give instructions on when to connect to VV.  Pt appreciative for call.

## 2020-11-04 NOTE — Telephone Encounter (Signed)
Called pt and he states that he has had issues with dizziness, nausea, being off balance and eye issues (focusing) for quite some time.  Seems to have worsened slightly over the last week.  Has to shut one eye to be able to write in a straight line.  Has seen PCP, Neuro, and eye doctor and no one can figure out what's going on.  Feels symptoms may be a little worse related to allergies.  Has taken allergy meds with no improvement.  States he has seen a doctor that told him he had pinched nerves in his back/neck and then the next one told him he didn't.  Most recent vitals are:  127/72, 68 128/70, 66 139/77, 72 Blood sugar usually 110-130  Pt currently switching PCPs and didn't want to reach out to them about worsening symptoms.  Wants to know if it is ok to change his Thursday appt to a MyChart virtual visit since he is not comfortable driving with his symptoms?

## 2020-11-04 NOTE — Telephone Encounter (Signed)
Preference to do video visit if at all possible. Will need to check cath site. So long as no complications at cath site, okay for virtual visit.   Loel Dubonnet, NP

## 2020-11-05 ENCOUNTER — Other Ambulatory Visit: Payer: Self-pay | Admitting: Cardiology

## 2020-11-05 NOTE — Telephone Encounter (Signed)
Prescription refill request for Xarelto received.  Indication:AFIB Last office visit:WALKER 10/16/20 Weight:132.5KG Age:49M Scr:1.2 10/21/20 CrCl:104.3

## 2020-11-07 ENCOUNTER — Telehealth (INDEPENDENT_AMBULATORY_CARE_PROVIDER_SITE_OTHER): Payer: Medicare Other | Admitting: Family

## 2020-11-07 ENCOUNTER — Encounter (HOSPITAL_BASED_OUTPATIENT_CLINIC_OR_DEPARTMENT_OTHER): Payer: Self-pay | Admitting: Family

## 2020-11-07 VITALS — BP 133/73 | HR 72 | Ht 75.0 in | Wt 291.0 lb

## 2020-11-07 DIAGNOSIS — I25118 Atherosclerotic heart disease of native coronary artery with other forms of angina pectoris: Secondary | ICD-10-CM | POA: Diagnosis not present

## 2020-11-07 DIAGNOSIS — E785 Hyperlipidemia, unspecified: Secondary | ICD-10-CM

## 2020-11-07 DIAGNOSIS — I38 Endocarditis, valve unspecified: Secondary | ICD-10-CM

## 2020-11-07 DIAGNOSIS — I4821 Permanent atrial fibrillation: Secondary | ICD-10-CM | POA: Diagnosis not present

## 2020-11-07 DIAGNOSIS — I272 Pulmonary hypertension, unspecified: Secondary | ICD-10-CM

## 2020-11-07 DIAGNOSIS — Z7901 Long term (current) use of anticoagulants: Secondary | ICD-10-CM

## 2020-11-07 DIAGNOSIS — G4733 Obstructive sleep apnea (adult) (pediatric): Secondary | ICD-10-CM

## 2020-11-07 NOTE — Progress Notes (Addendum)
Virtual Visit via Telephone Note   This visit type was conducted due to national recommendations for restrictions regarding the COVID-19 Pandemic (e.g. social distancing) in an effort to limit this patient's exposure and mitigate transmission in our community.  Due to his co-morbid illnesses, this patient is at least at moderate risk for complications without adequate follow up.  This format is felt to be most appropriate for this patient at this time.  The patient did not have access to video technology/had technical difficulties with video requiring transitioning to audio format only (telephone).  All issues noted in this document were discussed and addressed.  No physical exam could be performed with this format.  Please refer to the patient's chart for his  consent to telehealth for [facility].    Date:  11/14/2020   ID:  Michael Whitney, DOB [birth year], MRN [id number] The patient was identified using 2 identifiers.  Patient Location: Home Provider Location: Office/Clinic  Virtual Office Visit    Patient Name: Michael Whitney Date of Encounter: 11/14/2020  PCP:  [place], [name], [place]  Cardiologist:  Michael [name], Michael Whitney  Advanced Practice Provider:  No care team member to display Electrophysiologist:  Michael [place], Michael Whitney     Chief Complaint    Michael Whitney is a 70 y.o. 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), OSA presents today for follow-up after cardiac cath.  Past Medical History    Past Medical History:  Diagnosis Date   Atrial fibrillation ([place])    Cataract    Diabetes mellitus without complication ([place])    Hypertension    Seizures ([place])    Past Surgical History:  Procedure Laterality Date   CATARACT EXTRACTION     PVC ABLATION N/A 07/14/2017   Procedure: PVC ABLATION;   Surgeon: Evans Lance, Michael Whitney;  Location: [facility] [place];  Service: Cardiovascular;  Laterality: N/A;   RIGHT/LEFT HEART CATH AND CORONARY ANGIOGRAPHY N/A 10/24/2020   Procedure: RIGHT/LEFT HEART CATH AND CORONARY ANGIOGRAPHY;  Surgeon: Whitney, Michael M, Michael Whitney;  Location: [facility];  Service: Cardiovascular;  Laterality: N/A;   TONSILLECTOMY     VARICOSE VEIN SURGERY     Allergies  No Known Allergies  History of Present Illness    Michael Whitney is a 70 y.o. 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  Diagnosed with atrial fibrillation in 2019.  EP study by Dr. Lovena Le revealing PVCs arising near His bundle and ablation was not recommended due to potential for heart block.  He has been maintained on a rate control therapy with anticoagulation.  He has longstanding history of chronic dizziness.  Has been unrelated to changes in blood pressure or heart rate.  Has been thought to be due to vertigo and has followed with neurology.  He has completed some element of physical therapy with minimal improvement.  [facility]/01/2020.  He presented with feeling weak, dyspnea on exertion, chest pain, dizziness, lower extremity edema.  Troponin slightly elevated, BNP slightly elevated. TTE performed with bilateral atria severely dilated, LVEF 55 to 60%, RV moderately to severely dilated, RVSF moderately reduced, mild to moderate MR, moderate TR, mild to moderate pulmonary hypertension.  He  was recommended for diuresis.  He was also started on Protonix 40 mg twice daily for 30 days and then decrease to once daily after 1 month at follow-up with PCP.  His hydrochlorothiazide was increased to 25 mg daily.  Imdur 30 mg initiated.  Discharged on 7 day course of Lasix. He was recommended for consideration of  right heart cath as an outpatient.  Lab work 10/02/20 creatnine 1.08, GFR 73, Hb 16.7  Seen 10/16/20 noting dizziness which was persistent. He was concerned about weight gain and wondered if thyroid contributory. He endorsed pain as well as dyspnea on exertion was set up for right and left cardiac catheterization.  Underwent right and left cardiac catheterization November 07, 2020 showing nonobstructive coronary artery disease, minimally elevated left heart filling pressures, mild pulmonary hypertension, normal cardiac output.  He was on appropriate diuretic therapy and noted to have some component of diastolic heart failure.  Recommendation was to consider evaluating possible pulmonary cause of dyspnea.  A PYP scan to evaluate for possible amyloid could be considered.  He presents today for follow up via phone visit. Reports no shortness of breath at rest and stable dyspnea on exertion.  Tells me he uses CPAP for 3 days and his chest pressure went away.. No edema, orthopnea, PND. Reports no palpitations.    EKGs/Labs/Other Studies Reviewed:   The following studies were reviewed today: LHC 11/07/20   Ost LM to Mid LM lesion is 30% stenosed.   Prox LAD lesion is 30% stenosed.   Mid Cx lesion is 45% stenosed.   Prox RCA to Mid RCA lesion is 30% stenosed.   The left ventricular systolic function is normal.   LV end diastolic pressure is mildly elevated.   The left ventricular ejection fraction is 55-65% by visual estimate.   Hemodynamic findings consistent with mild pulmonary hypertension.   There is no aortic valve stenosis.   There is trivial (1+) mitral regurgitation.   Nonobstructive CAD Minimally elevated left heart filling pressures.  Mild pulmonary HTN. Normal cardiac output.   Plan: patient is on appropriate diuretic therapy (considering he was hydrated prior to cath). Would consider evaluating possible pulmonary cause of his dyspnea. He does have some component of diastolic CHF. May consider PYP  scan to evaluate for possible amyloid   Echo 10/02/20 The left ventricular size is normal. There is severe concentric left  ventricular hypertrophy. The left ventricular wall motion is normal.  Left ventricular systolic function is normal. LV ejection fraction =  55-60%.  Left ventricular filling pattern is pseudonormal. LAP is  indeterminate.  The right ventricle is moderate to severely dilated. The right  ventricular systolic function is moderately reduced.  The atria are severely dilated.  There is mild to moderate mitral regurgitation.  There is moderate tricuspid regurgitation.  Mild to moderate pulmonary hypertension.  Estimated right ventricular systolic pressure is 46 mmHg.  The IVC is dilated with an abnormal collapsibility index, this  suggestive of increased right atrial pressure.   Progression of RV changes and atrial dilation compared to 2020 study.   EKG: No  EKG is ordered today.  The ekg independently reviewed from 10/16/20 demonstrated rate controlled atrial fibrillation 63bpm with stable RBBB.   Recent Labs: 10/18/2020: BUN 26; Creatinine, Ser 1.26; Platelets 144 Nov 07, 2020: Hemoglobin 15.6; Hemoglobin 15.6; Potassium 4.5; Potassium 4.5; Sodium 142; Sodium 141  Recent Lipid Panel    Component Value Date/Time   CHOL 111 11/04/2012 1125   TRIG 104 11/04/2012 1125   HDL 35 (  L) 11/04/2012 1125   CHOLHDL 3.2 11/04/2012 1125   VLDL 21 11/04/2012 1125   LDLCALC 55 11/04/2012 1125    Home Medications   Current Meds  Medication Sig   Accu-Chek FastClix Lancets MISC Use 1 lancet daily to test blood sugar (E11.9)   azelastine (ASTELIN) 0.1 % nasal spray Place 1 spray into both nostrils 2 (two) times daily as needed for rhinitis. Use in each nostril as directed   Cholecalciferol (VITAMIN D-3) 25 MCG (1000 UT) CAPS Take 1,000 Units by mouth daily.   furosemide (LASIX) 20 MG tablet Take 1 tablet (20 mg total) by mouth daily.   losartan (COZAAR) 100 MG tablet Take 100 mg by  mouth daily.   Multiple Vitamin (MULTI-VITAMIN) tablet Take 1 tablet by mouth daily.   Omega-3 Fatty Acids (FISH OIL) 1000 MG CAPS Take 1,000 mg by mouth daily.   OVER THE COUNTER MEDICATION Take 6 drops by mouth daily. Vitamins A, D, and K   triamcinolone cream (KENALOG) 0.1 % Apply 1 application topically every other day.   vitamin B-12 (CYANOCOBALAMIN) 1000 MCG tablet Take 1,000 mcg by mouth daily.   Vitamin D, Ergocalciferol, (DRISDOL) 1.25 MG (50000 UNIT) CAPS capsule Take 50,000 Units by mouth every Tuesday.   XARELTO 20 MG TABS tablet TAKE 1 TABLET(20 MG) BY MOUTH DAILY WITH SUPPER     Review of Systems    All other systems reviewed and are otherwise negative except as noted above.  Physical Exam    VS:  BP 133/73   Pulse 72   Ht '6\' 3"'$  (1.905 m)   Wt 291 lb (132 kg)   BMI 36.37 kg/m  , BMI Body mass index is 36.37 kg/m.  Wt Readings from Last 3 Encounters:  11/07/20 291 lb (132 kg)  10/24/20 285 lb (129.3 kg)  10/16/20 292 lb (132.5 kg)     GEN: Well nourished, well developed, in no acute distress. HEENT: normal. Neck: Supple, no JVD, carotid bruits, or masses. Cardiac: irregularly irregular, no murmurs, rubs, or gallops. No clubbing, cyanosis, edema.  Radials/PT 2+ and equal bilaterally.  Respiratory:  Respirations regular and unlabored, clear to auscultation bilaterally. GI: Soft, nontender, nondistended. MS: No deformity or atrophy. Skin: Warm and dry, no rash. Neuro:  Strength and sensation are intact. Psych: Normal affect.  Assessment & Plan    LE edema / DOE / Chest pain in adult / Pulmonary hypertension/ Valvular heart disease -Echo 09/2020 with mild to moderate pulmonary hypertension, normal LVEF, mild to moderate MR, moderate TR. Recent R/LHC with nonobstructive CAD, mild (1+) MR. Offered referral to pulmonology to discuss etiology of his dyspnea which he politely declines. Per cardiac cath was euvolemic and we will continue Lasix '20mg'$  QD. Reports still with  abdominal bloating, encouraged to increase protein intake as it does sound as if he is under eating. Discussed that regular exercise, not just calorie restriction is important for weight loss.  We will plan for repeat clinic visit in 4 months with diet and lifestyle changes. If symptoms unchanged, could consider PYP scan to evaluate for possible amyloid. Will defer today as no recurrent chest pain and dyspnea overall stable.  Nonobstructive CAD / HLD, LDL goal <70 - No chest pin, pressure, tightness. Cardiac cath 10/24/20 with nonobstructive CAD. Not presently on lipid lowering therapy. He tells me he is uninterested in statin. Discussed initiation of Zetia which he tells me he will consider. Ultimately may require PCSK9i. Discussed that given CAD and 04/21/20 LDL 178 low  suspicion diet and exercise alone would get LDL to goal.   OSA - CPAP compliance encouraged. Notes he is not certain who manages his CPAP - per my review he previously saw Michael Whitney of [facility] and will provide him her contact info.  DM2 - Continue to follow with PCP. If additional agent needed, consider SGLT2i for cardioprotective benefit.   Permanent atrial fibrillation /chronic anticoagulation - Rate controlled.  Overall asymptomatic with no palpitations.  Not requiring AV nodal blocking agent.  Anticoagulated with Xarelto '20mg'$  QD due to CHADS2VASc of at least 4 (age, HTN, valvular heart disease, DM2).  Denies bleeding complications.  Hypertension - BP well controlled. Continue current antihypertensive regimen including Lasix '20mg'$  QD, Losartan '100mg'$  QD. Home monitoring encouraged.   Hashimoto's thyroiditis - Continue to follow with PCP, endocrinology. Upcoming appt to establish with new PCP.  Dizziness-  Continue to follow with ENT due to possible vertigo. Was referred to neurology by primary care.  Recent RHC with trivial MR and otherwise no significant valvular abnormality to contribute to dizziness.   Disposition: Follow up  in 4 months with Dr. [name] or APP.  27 minutes spent via phone visit with patient.  Signed, Michael Dubonnet, NP 11/14/2020, 7:59 AM [place]

## 2020-11-07 NOTE — Patient Instructions (Addendum)
Medication Instructions:  Continue your current medications.   Lab Work: None ordered today.   Testing/Procedures: Your cardiac catheterization showed nonobstructive coronary disease. This means there was plaque but no restriction in blood flow. It is important to manage cholesterol well with LDL (bad cholesterol) goal of <70 which most often requires medication. If you have had difficulty with statins we could trial a low dose statin 3 times per week, Ezetimibe (Zetia) which is a non-statin tablet, or an injectable cholesterol medication called PCSK9i.   Follow-Up: At [facility], you and your health needs are our priority.  As part of our continuing mission to provide you with exceptional heart care, we have created designated Provider Care Teams.  These Care Teams include your primary Cardiologist (physician) and Advanced Practice Providers (APPs -  Physician Assistants and Nurse Practitioners) who all work together to provide you with the care you need, when you need it.  Your next appointment:   04/18/21 at 1:20PM with Dr. [name] at [facility] in-person  Other Instructions  You previously saw Dr. Doy Hutching of [facility] for CPAP management. Their phone number is [phone]. Recommend following up with them regarding your CPAP settings.   Recommend increasing your protein intake to see if it helps with weight loss and abdominal bloating. Sources of protein are included below.    Consider a medication called Zetia (Ezetimibe) to help lower your cholesterol. Additional information is included below.   Protein Content in Foods Protein is a necessary nutrient in any diet. It helps build and repair muscles, bones, and skin. Depending on your overall health, you may need more or less protein in your diet. You are encouraged to eat a variety of protein foods to ensure that you get all the essential nutrients that are found in different protein foods. Talk with your health care  provider or dietitian about how much protein you need each day and which sources of protein are best for you. Protein is especially important for: Repairing and making cells and tissues. Fighting infection. Providing energy. Growth and development. See the following list for the protein content of some common foods. What are tips for getting more protein in your diet? Try to replace processed carbohydrates with high-quality protein. Snack on nuts and seeds instead of chips. Replace baked desserts with [place] yogurt. Eat protein foods from both plant and animal sources. Replace red meat with seafood. Add beans and peas to salads, soups, and side dishes. Include a protein food with each meal and snack. Reading food labels You can find the amount of protein in a food item by looking at the nutrition facts label. Use the total grams listed to help you reach your daily goal. What foods are high in protein? High-protein foods contain 4 grams (g) or more of protein per serving. They include: Grains Quinoa (cooked) -- 1 cup (185 g) has 8 g of protein. Whole wheat pasta (cooked) -- 1 cup (140 g) has 6 g of protein. Meat Beef, ground sirloin (cooked) -- 3 oz (85 g) has 24 g of protein. Chicken breast, boneless and skinless (cooked) -- 3 oz (85 g) has 25 g of protein. Egg -- 1 egg has 6 g of protein. Fish, filet (cooked) -- 1 oz (28 g) has 6-7 g of protein. Lamb (cooked) -- 3 oz (85 g) has 24 g of protein. Pork tenderloin (cooked) -- 3 oz (85 g) has 23 g of protein. Tuna (canned in water) -- 3 oz (85 g) has 20  g of protein. Dairy Cottage cheese --  cup (114 g) has 13.4 g of protein. Milk -- 1 cup (237 mL) has 8 g of protein. Cheese (hard) -- 1 oz (28 g) has 7 g of protein. Yogurt, regular -- 6 oz (170 g) has 8 g of protein. Greek yogurt -- 6 oz (200 g) has 18 g protein. Plant protein Garbanzo beans (canned or cooked) --  cup (130 g) has 6-7 g of protein. Kidney beans (canned or cooked) --   cup (130 g) has 6-7 g of protein. Nuts (peanuts, pistachios, almonds) -- 1 oz (28 g) has 6 g of protein. Peanut butter -- 1 oz (32 g) has 7-8 g of protein. Pumpkin seeds -- 1 oz (28 g) has 8.5 g of protein. Soybeans (roasted) -- 1 oz (28 g) has 8 g of protein. Soybeans (cooked) --  cup (90 g) has 11 g of protein. Soy milk -- 1 cup (250 mL) has 5-10 g of protein. Soy or vegetable patty -- 1 patty has 11 g of protein. Sunflower seeds -- 1 oz (28 g) has 5.5 g of protein. Buckwheat -- 1 oz (33 g) has 4.3 g of protein. Tofu (firm) --  cup (124 g) has 20 g of protein. Tempeh --  cup (83 g) has 16 g of protein. The items listed above may not be a complete list of foods high in protein. Actual amounts of protein may differ depending on processing. Contact a dietitian for more information. What foods are low in protein? Low-protein foods contain 3 grams (g) or less of protein per serving. They include: Fruits Fruit or vegetable juice --  cup (125 mL) has 1 g of protein. Vegetables Beets (raw or cooked) --  cup (68 g) has 1.5 g of protein. Broccoli (raw or cooked) --  cup (44 g) has 2 g of protein. Collard greens (raw or cooked) --  cup (42 g) has 2 g of protein. Green beans (raw or cooked) --  cup (83 g) has 1 g of protein. Green peas (canned) --  cup (80 g) has 3.5 g of protein. Potato (baked with skin) -- 1 medium potato (173 g) has 3 g of protein. Spinach (cooked) --  cup (90 g) has 3 g of protein. Squash (cooked) --  cup (90 g) has 1.5 g of protein. Avocado -- 1 cup (146 g) has 2.7 g of protein. Grains Bran cereal --  cup (30 g) has 2-3 g of protein. Bread -- 1 slice has 2.5 g of protein. Corn (fresh or cooked) --  cup (77 g) has 2 g of protein. Flour tortilla -- One 6-inch (15 cm) tortilla has 2.5 g of protein. Muffins -- 1 small muffin (2 oz or 57 g) has 3 g of protein. Oatmeal (cooked) --  cup (40 g) has 3 g of protein. Rice (cooked) --  cup (79 g) has 2.5-3.5 g of  protein. Dairy Cream cheese -- 1 oz (29 g) has 2 g of protein. Creamer (half-and-half) -- 1 oz (29 mL) has 1 g of protein. Frozen yogurt --  cup (72 g) has 3 g of protein. Sour cream --  cup (75 g) has 2.5 g of protein. The items listed above may not be a complete list of foods low in protein. Actual amounts of protein may differ depending on processing. Contact a dietitian for more information. Summary Protein is a nutrient that your body needs for growth and development, repairing and making cells and  tissues, fighting infection, and providing energy. Protein is in both plant and animal foods. Some of these foods have more protein than others. Depending on your overall health, you may need more or less protein in your diet. Talk to your health care provider about how much protein you need. This information is not intended to replace advice given to you by your health care provider. Make sure you discuss any questions you have with your health care provider. Document Revised: 01/15/2020 Document Reviewed: 01/15/2020 Elsevier Patient Education  2022 [place].  Ezetimibe Tablets What is this medication? EZETIMIBE (ez ET i mibe) treats high cholesterol. It works by reducing the amount of cholesterol absorbed from the food you eat. This decreases the amount of bad cholesterol (such as LDL) in your blood. Changes to diet and exercise are often combined with this medication. This medicine may be used for other purposes; ask your health care provider or pharmacist if you have questions. COMMON BRAND NAME(S): Zetia What should I tell my care team before I take this medication? They need to know if you have any of these conditions: Kidney disease Liver disease Muscle cramps, pain Muscle injury Thyroid disease An unusual or allergic reaction to ezetimibe, other medications, foods, dyes, or preservatives Pregnant or trying to get pregnant Breast-feeding How should I use this  medication? Take this medication by mouth. Take it as directed on the prescription label at the same time every day. You can take it with or without food. If it upsets your stomach, take it with food. Keep taking it unless your care team tells you to stop. Take bile acid sequestrants at a different time of day than this medication. Take this medication 2 hours BEFORE or 4 hours AFTER bile acid sequestrants. Talk to your care team about the use of this medication in children. While it may be prescribed for children as young as 10 for selected conditions, precautions do apply. Overdosage: If you think you have taken too much of this medicine contact a poison control center or emergency room at once. NOTE: This medicine is only for you. Do not share this medicine with others. What if I miss a dose? If you miss a dose, take it as soon as you can. If it is almost time for your next dose, take only that dose. Do not take double or extra doses. What may interact with this medication? Do not take this medication with any of the following: Fenofibrate Gemfibrozil This medication may also interact with the following: Antacids Cyclosporine Herbal medications like red yeast rice Other medications to lower cholesterol or triglycerides This list may not describe all possible interactions. Give your health care provider a list of all the medicines, herbs, non-prescription drugs, or dietary supplements you use. Also tell them if you smoke, drink alcohol, or use illegal drugs. Some items may interact with your medicine. What should I watch for while using this medication? Visit your care team for regular checks on your progress. Tell your care team if your symptoms do not start to get better or if they get worse. Your care team may tell you to stop taking this medication if you develop muscle problems. If your muscle problems do not go away after stopping this medication, contact your care team. Do not become  pregnant while taking this medication. Women should inform their care team if they wish to become pregnant or think they might be pregnant. There is potential for serious harm to an unborn child. Talk  to your care team for more information. Do not breast-feed an infant while taking this medication. Taking this medication is only part of a total heart healthy program. Your care team may give you a special diet to follow. Avoid alcohol. Avoid smoking. Ask your care team how much you should exercise. What side effects may I notice from receiving this medication? Side effects that you should report to your doctor or health care provider as soon as possible: Allergic reactions-skin rash, itching or hives, swelling of the face, lips, tongue, or throat Side effects that usually do not require medical attention (report to your doctor or health care provider if they continue or are bothersome): Diarrhea Joint pain This list may not describe all possible side effects. Call your doctor for medical advice about side effects. You may report side effects to FDA at 1-800-FDA-1088. Where should I keep my medication? Keep out of the reach of children and pets. Store at room temperature between 15 and 30 degrees C (59 and 86 degrees F). Protect from moisture. Get rid of any unused medication after the expiration date. NOTE: This sheet is a summary. It may not cover all possible information. If you have questions about this medicine, talk to your doctor, pharmacist, or health care provider.  2022 Elsevier/Gold Standard (2020-03-08 12:29:13)

## 2020-11-28 ENCOUNTER — Telehealth: Payer: Self-pay | Admitting: Cardiology

## 2020-11-28 ENCOUNTER — Other Ambulatory Visit: Payer: Self-pay

## 2020-11-28 NOTE — Telephone Encounter (Signed)
Agree with plan  [name]

## 2020-11-28 NOTE — Telephone Encounter (Signed)
Spoke to patient he stated he has been having double vision for the past 1 week.Stated he saw ophthalmologist was told normal except dry eyes.Stated last night he rolled over in bed and became very dizzy,nausea.Felt like " fireworks" went off in head.Lasted appox 30 sec.He has been having pressure in head,both ears ring.Stated he stopped taking Losartan 2 days ago.B/P this morning 145/78,162/94.Pulse in 60's.He will restart Losartan this morning and take 50 mg.Advised to see PCP.Stated he has appointment with new PCP Dr.Ed Isidore Moos tomorrow 10/7.Advised I will make Dr.Jordan aware.

## 2020-11-28 NOTE — Telephone Encounter (Signed)
Patient called to talk with Dr. [name] or nurse in regards to him getting double-vision

## 2020-11-29 ENCOUNTER — Encounter: Payer: Self-pay | Admitting: Medical

## 2020-11-29 ENCOUNTER — Ambulatory Visit: Payer: Medicare Other | Attending: Internal Medicine

## 2020-11-29 ENCOUNTER — Ambulatory Visit (INDEPENDENT_AMBULATORY_CARE_PROVIDER_SITE_OTHER): Payer: Medicare Other | Admitting: Medical

## 2020-11-29 VITALS — BP 134/79 | HR 85 | Temp 98.3°F | Resp 18 | Ht 75.0 in | Wt 295.0 lb

## 2020-11-29 DIAGNOSIS — E119 Type 2 diabetes mellitus without complications: Secondary | ICD-10-CM | POA: Diagnosis not present

## 2020-11-29 DIAGNOSIS — I5032 Chronic diastolic (congestive) heart failure: Secondary | ICD-10-CM

## 2020-11-29 DIAGNOSIS — E038 Other specified hypothyroidism: Secondary | ICD-10-CM

## 2020-11-29 DIAGNOSIS — I10 Essential (primary) hypertension: Secondary | ICD-10-CM

## 2020-11-29 DIAGNOSIS — Z23 Encounter for immunization: Secondary | ICD-10-CM | POA: Diagnosis not present

## 2020-11-29 DIAGNOSIS — R0600 Dyspnea, unspecified: Secondary | ICD-10-CM

## 2020-11-29 DIAGNOSIS — E559 Vitamin D deficiency, unspecified: Secondary | ICD-10-CM

## 2020-11-29 DIAGNOSIS — I4819 Other persistent atrial fibrillation: Secondary | ICD-10-CM

## 2020-11-29 DIAGNOSIS — R42 Dizziness and giddiness: Secondary | ICD-10-CM

## 2020-11-29 DIAGNOSIS — R7989 Other specified abnormal findings of blood chemistry: Secondary | ICD-10-CM

## 2020-11-29 DIAGNOSIS — G4730 Sleep apnea, unspecified: Secondary | ICD-10-CM

## 2020-11-29 DIAGNOSIS — H8309 Labyrinthitis, unspecified ear: Secondary | ICD-10-CM | POA: Diagnosis not present

## 2020-11-29 DIAGNOSIS — H8109 Meniere's disease, unspecified ear: Secondary | ICD-10-CM

## 2020-11-29 LAB — CBC WITH DIFFERENTIAL/PLATELET
Basophils Absolute: 0.1 10*3/uL (ref 0.0–0.1)
Basophils Relative: 1.1 % (ref 0.0–3.0)
Eosinophils Absolute: 0.1 10*3/uL (ref 0.0–0.7)
Eosinophils Relative: 1.9 % (ref 0.0–5.0)
HCT: 49.6 % (ref 39.0–52.0)
Hemoglobin: 16.3 g/dL (ref 13.0–17.0)
Lymphocytes Relative: 23.7 % (ref 12.0–46.0)
Lymphs Abs: 1.3 10*3/uL (ref 0.7–4.0)
MCHC: 32.8 g/dL (ref 30.0–36.0)
MCV: 100.3 fl — ABNORMAL HIGH (ref 78.0–100.0)
Monocytes Absolute: 0.6 10*3/uL (ref 0.1–1.0)
Monocytes Relative: 11.2 % (ref 3.0–12.0)
Neutro Abs: 3.5 10*3/uL (ref 1.4–7.7)
Neutrophils Relative %: 62.1 % (ref 43.0–77.0)
Platelets: 153 10*3/uL (ref 150.0–400.0)
RBC: 4.95 Mil/uL (ref 4.22–5.81)
RDW: 14.2 % (ref 11.5–15.5)
WBC: 5.6 10*3/uL (ref 4.0–10.5)

## 2020-11-29 LAB — COMPREHENSIVE METABOLIC PANEL
ALT: 25 U/L (ref 0–53)
AST: 30 U/L (ref 0–37)
Albumin: 4.4 g/dL (ref 3.5–5.2)
Alkaline Phosphatase: 41 U/L (ref 39–117)
BUN: 22 mg/dL (ref 6–23)
CO2: 28 mEq/L (ref 19–32)
Calcium: 9 mg/dL (ref 8.4–10.5)
Chloride: 102 mEq/L (ref 96–112)
Creatinine, Ser: 1.1 mg/dL (ref 0.40–1.50)
GFR: 67.14 mL/min (ref >60.00)
Glucose, Bld: 125 mg/dL — ABNORMAL HIGH (ref 70–99)
Potassium: 4.6 mEq/L (ref 3.5–5.1)
Sodium: 138 mEq/L (ref 135–145)
Total Bilirubin: 0.9 mg/dL (ref 0.2–1.2)
Total Protein: 6.6 g/dL (ref 6.0–8.3)

## 2020-11-29 LAB — HEMOGLOBIN A1C: Hgb A1c MFr Bld: 6.7 % — ABNORMAL HIGH (ref 4.6–6.5)

## 2020-11-29 LAB — LIPID PANEL
Cholesterol: 226 mg/dL — ABNORMAL HIGH (ref 0–200)
HDL: 41 mg/dL (ref >39.00)
LDL Cholesterol: 153 mg/dL — ABNORMAL HIGH (ref 0–99)
NonHDL: 184.53
Total CHOL/HDL Ratio: 6
Triglycerides: 156 mg/dL — ABNORMAL HIGH (ref 0.0–149.0)
VLDL: 31.2 mg/dL (ref 0.0–40.0)

## 2020-11-29 LAB — TSH: TSH: 1.55 u[IU]/mL (ref 0.35–5.50)

## 2020-11-29 LAB — T4, FREE: Free T4: 0.55 ng/dL — ABNORMAL LOW (ref 0.60–1.60)

## 2020-11-29 NOTE — Patient Instructions (Addendum)
History of hypertension.  Blood pressure well controlled today.  Continue losartan.  We will get CMP and lipid panel today.  History of atrial fibrillation.  Continue Xarelto and Lasix.  Continue to follow-up with cardiologist on regular basis.  History of coronary artery disease seen on recent cardiac catheterization.  I do recommend that you follow recommendation from cardiologist regarding potentially using Zetia or PC KS 9 inhibitor.  History of diabetes.  Will get CMP and A1c today.  Depending on level of blood sugar average consider medication.  History of dyspnea on exertion and sleep apnea.  Referred to a pulmonologist.  Remote history of smoking a pipe could be playing a role.  History of low thyroid/hypothyroidism.  Check TSH and T4 today.  History of chronic intermittent dizziness/Mnire's disease.  Referral to ENT in the past but he still reports symptoms.  Consider referral to neurologist.  History of obesity.  Recent weight loss efforts unsuccessful.  I recommend that you try weight watchers diet.  If that does not work then can refer to weight loss clinic.  Follow-up in 2 weeks or sooner if needed.

## 2020-11-29 NOTE — Progress Notes (Signed)
   Covid-19 Vaccination Clinic  Name:  Michael Whitney    MRN: [id number] DOB: 1950-06-01  11/29/2020  [name] was observed post Covid-19 immunization for 15 minutes without incident. He was provided with Vaccine Information Sheet and instruction to access the V-Safe system.   [name] was instructed to call 911 with any severe reactions post vaccine: Difficulty breathing  Swelling of face and throat  A fast heartbeat  A bad rash all over body  Dizziness and weakness

## 2020-11-29 NOTE — Progress Notes (Signed)
Subjective:    Patient ID: Michael Whitney, male    DOB: Apr 26, 1950, 70 y.o.   MRN: [id number]  HPI  Pt in for first time.  Pt was with Elita Quick. 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 Alcohol use- 2 shots of scotch on Friday and Saturday.     Pt has hx of htn,  some dyspnea, atrial fibrillation, diabetes, hx of seizure, pulmonary htn, low vit d,  hypothyroidism, allergies, high cholesterol and dry eyes.   Pt states one seizure many years ago and never placed on medication.(States seizure occurred 3-4 years ago.) Thinks x girfriend gave him something to cause seizure?   His chronic  problems feeling  off balance for 7 years. Desire to loose weight, dry skin and blurred vision.  Some dyspnea on exertion when he walks. Pt smoked pipe in past. He smoked  pipe for 10-15 years. States was occasional.    Pt recently saw cardiologist and evaluate in ED for chest pain.  Assessment & Plan    LE edema / DOE / Chest pain in adult / Pulmonary hypertension/ Valvular heart disease -Echo 09/2020 with mild to moderate pulmonary hypertension, normal LVEF, mild to moderate MR, moderate TR. Recent R/LHC with nonobstructive CAD, mild (1+) MR. Offered referral to pulmonology to discuss etiology of his dyspnea which he politely declines. Per cardiac cath was euvolemic and we will continue Lasix 20mg  QD. Reports still with abdominal bloating, encouraged to increase protein intake as it does sound as if he is under eating. Discussed that regular exercise, not just calorie restriction is important for weight loss.  We will plan for repeat clinic visit in 4 months with diet and lifestyle changes. If symptoms unchanged, could consider PYP scan to  evaluate for possible amyloid. Will defer today as no recurrent chest pain and dyspnea overall stable.   Nonobstructive CAD / HLD, LDL goal <70 - No chest pin, pressure, tightness. Cardiac cath 10/24/20 with nonobstructive CAD. Not presently on lipid lowering therapy. He tells me he is uninterested in statin. Discussed initiation of Zetia which he tells me he will consider. Ultimately may require PCSK9i. Discussed that given CAD and 04/21/20 LDL 178 low suspicion diet and exercise alone would get LDL to goal.    OSA - CPAP compliance encouraged. Notes he is not certain who manages his CPAP - per my review he previously saw Dr. Doy Hutching of [facility] and will provide him her contact info.   DM2 - Continue to follow with PCP. If additional agent needed, consider SGLT2i for cardioprotective benefit.    Permanent atrial fibrillation /chronic anticoagulation - Rate controlled.  Overall asymptomatic with no palpitations.  Not requiring AV nodal blocking agent.  Anticoagulated with Xarelto 20mg  QD due to CHADS2VASc of at least 4 (age, HTN, valvular heart disease, DM2).  Denies bleeding complications.   Hypertension - BP well controlled. Continue current antihypertensive regimen including Lasix 20mg  QD, Losartan 100mg  QD. Home monitoring encouraged.    Hashimoto's thyroiditis - Continue to follow with PCP, endocrinology. Upcoming appt to establish with new PCP.   Dizziness-  Continue to follow with ENT due to possible vertigo. Was referred to neurology by primary care.  Recent RHC with trivial MR and otherwise no significant valvular abnormality to  contribute to dizziness.    Disposition: Follow up in 4 months with Dr. [name] or APP      Review of Systems  Constitutional:  Negative for chills, fatigue and fever.  Respiratory:  Positive for shortness of breath. Negative for cough, chest tightness and wheezing.   Cardiovascular:  Negative for chest pain and palpitations.  Gastrointestinal:  Negative for  abdominal pain.  Genitourinary:  Negative for dysuria and flank pain.  Musculoskeletal:  Negative for back pain, joint swelling and myalgias.  Neurological:  Positive for dizziness.       Chronic feeling off balance  Psychiatric/Behavioral:  Negative for behavioral problems and confusion.    Past Medical History:  Diagnosis Date   Atrial fibrillation ([place])    Cataract    Diabetes mellitus without complication ([place])    Hypertension    Seizures ([place])      Social History   Socioeconomic History   Marital status: Widowed    Spouse name: Not on file   Number of children: 2   Years of education: Not on file   Highest education level: Not on file  Occupational History   Occupation: retired    Comment: Geophysicist/field seismologist  Tobacco Use   Smoking status: Never   Smokeless tobacco: Never  Vaping Use   Vaping Use: Never used  Substance and Sexual Activity   Alcohol use: Yes    Alcohol/week: 2.0 standard drinks    Types: 1 Shots of liquor, 1 Cans of beer per week    Comment: occasionally   Drug use: No   Sexual activity: Not on file  Other Topics Concern   Not on file  Social History Narrative   Not on file   Social Determinants of Health   Financial Resource Strain: Not on file  Food Insecurity: Not on file  Transportation Needs: Not on file  Physical Activity: Not on file  Stress: Not on file  Social Connections: Not on file  Intimate Partner Violence: Not on file    Past Surgical History:  Procedure Laterality Date   CATARACT EXTRACTION     PVC ABLATION N/A 07/14/2017   Procedure: PVC ABLATION;  Surgeon: Evans Lance, MD;  Location: [facility];  Service: Cardiovascular;  Laterality: N/A;   RIGHT/LEFT HEART CATH AND CORONARY ANGIOGRAPHY N/A 10/24/2020   Procedure: RIGHT/LEFT HEART CATH AND CORONARY ANGIOGRAPHY;  Surgeon: [name], Peter M, MD;  Location: [facility];  Service: Cardiovascular;  Laterality: N/A;   TONSILLECTOMY     VARICOSE VEIN SURGERY       Family History  Problem Relation Age of Onset   CAD Mother        MI at age 51    No Known Allergies  Current Outpatient Medications on File Prior to Visit  Medication Sig Dispense Refill   Accu-Chek FastClix Lancets MISC Use 1 lancet daily to test blood sugar (E11.9)     azelastine (ASTELIN) 0.1 % nasal spray Place 1 spray into both nostrils 2 (two) times daily as needed for rhinitis. Use in each nostril as directed 30 mL 12   Cholecalciferol (VITAMIN D-3) 25 MCG (1000 UT) CAPS Take 1,000 Units by mouth daily.     furosemide (LASIX) 20 MG tablet Take 1 tablet (20 mg total) by mouth daily. 30 tablet 2   losartan (COZAAR) 100 MG tablet Take 1/2 tablet ( 50 mg ) daily 90 tablet 3   Multiple Vitamin (MULTI-VITAMIN) tablet Take 1 tablet by mouth daily.  Omega-3 Fatty Acids (FISH OIL) 1000 MG CAPS Take 1,000 mg by mouth daily.     OVER THE COUNTER MEDICATION Take 6 drops by mouth daily. Vitamins A, D, and K     triamcinolone cream (KENALOG) 0.1 % Apply 1 application topically every other day.     vitamin B-12 (CYANOCOBALAMIN) 1000 MCG tablet Take 1,000 mcg by mouth daily.     Vitamin D, Ergocalciferol, (DRISDOL) 1.25 MG (50000 UNIT) CAPS capsule Take 50,000 Units by mouth every Tuesday.     XARELTO 20 MG TABS tablet TAKE 1 TABLET(20 MG) BY MOUTH DAILY WITH SUPPER 90 tablet 1   No current facility-administered medications on file prior to visit.    BP 134/79   Pulse 85   Temp 98.3 F (36.8 C)   Resp 18   Ht 6\' 3"  (1.905 m)   Wt 295 lb (133.8 kg)   SpO2 96%   BMI 36.87 kg/m       Objective:   Physical Exam  General Mental Status- Alert. General Appearance- Not in acute distress.   Skin General: Color- Normal Color. Moisture- Normal Moisture.  Neck Carotid Arteries- Normal color. Moisture- Normal Moisture. No JVD.  Chest and Lung Exam Auscultation: Breath Sounds:-Normal.  Cardiovascular Auscultation:Rythm- Regular. Murmurs & Other Heart Sounds:Auscultation of  the heart reveals- No Murmurs.  Abdomen Inspection:-Inspeection Normal. Palpation/Percussion:Note:No mass. Palpation and Percussion of the abdomen reveal- Non Tender, Non Distended + BS, no rebound or guarding.  Neurologic Cranial Nerve exam:- CN III-XII intact(No nystagmus), symmetric smile. Strength:- 5/5 equal and symmetric strength both upper and lower extremities.      Assessment & Plan:   Patient Instructions  History of hypertension.  Blood pressure well controlled today.  Continue losartan.  We will get CMP and lipid panel today.  History of atrial fibrillation.  Continue Xarelto and Lasix.  Continue to follow-up with cardiologist on regular basis.  History of coronary artery disease seen on recent cardiac catheterization.  I do recommend that you follow recommendation from cardiologist regarding potentially using Zetia or PC KS 9 inhibitor.  History of diabetes.  Will get CMP and A1c today.  Depending on level of blood sugar average consider medication.  History of dyspnea on exertion and sleep apnea.  Referred to a pulmonologist.  Remote history of smoking a pipe could be playing a role.  History of low thyroid/hypothyroidism.  Check TSH and T4 today.  History of chronic intermittent dizziness/Mnire's disease.  Referral to ENT in the past but he still reports symptoms.  Consider referral to neurologist.  History of obesity.  Recent weight loss efforts unsuccessful.  I recommend that you try weight watchers diet.  If that does not work then can refer to weight loss clinic.  Follow-up in 2 weeks or sooner if needed.   Time spent with patient today was  46  minutes which consisted of chart review, discussing diagnosis, work up,treatment and documentation.

## 2020-12-03 LAB — VITAMIN D 1,25 DIHYDROXY
Vitamin D 1, 25 (OH)2 Total: 71 pg/mL (ref 18–72)
Vitamin D2 1, 25 (OH)2: 25 pg/mL
Vitamin D3 1, 25 (OH)2: 46 pg/mL

## 2020-12-04 ENCOUNTER — Telehealth: Payer: Self-pay | Admitting: Medical

## 2020-12-04 NOTE — Telephone Encounter (Signed)
Patient called back to let us know that triage nurses recommended him to call 911 with concerns of a stroke. He did as told and EMS checked him out but per patient they told him he was ok. An appointment was made on 10/14 to discuss his current issues.

## 2020-12-04 NOTE — Telephone Encounter (Signed)
Patient is experiencing double vision when he moves his head/neck. He describes his pain feeling like a pinch nerve and would like to inform [facility] to provide him with advise. Transferred to Triage.

## 2020-12-04 NOTE — Telephone Encounter (Signed)
Pt is coming in Friday at 1

## 2020-12-05 NOTE — Telephone Encounter (Signed)
Nurse Assessment Nurse: Sheppard Plumber, RN, Estill Bamberg Date/Time (Eastern Time): 12/04/2020 12:13:55 PM Confirm and document reason for call. If symptomatic, describe symptoms. ---caller states he is feeling dizzy. says he thinks the back of neck is pinched. double vision. Does the patient have any new or worsening symptoms? ---Yes Will a triage be completed? ---Yes Related visit to physician within the last 2 weeks? ---N/A Does the PT have any chronic conditions? (i.e. diabetes, asthma, this includes High risk factors for pregnancy, etc.) ---Yes List chronic conditions. ---afib on blood thinner Is this a behavioral health or substance abuse call? ---No Guidelines Guideline Title Affirmed Question Affirmed Notes Nurse Date/Time (Eastern Time) Neurologic Deficit [1] Numbness (i.e., loss of sensation) of the face, arm / hand, or leg / foot on one side of the body AND [2] sudden onset AND [3] present now Humfleet, RN, Estill Bamberg 12/04/2020 12:15:22 PM Disp. Time Eilene Ghazi Time) Disposition Final User 12/04/2020 12:12:35 PM Send to Urgent Kerrin Champagne, Leighton Roach PLEASE NOTE: All timestamps contained within this report are represented as [place] Standard Time. CONFIDENTIALTY NOTICE: This fax transmission is intended only for the addressee. It contains information that is legally privileged, confidential or otherwise protected from use or disclosure. If you are not the intended recipient, you are strictly prohibited from reviewing, disclosing, copying using or disseminating any of this information or taking any action in reliance on or regarding this information. If you have received this fax in error, please notify us immediately by telephone so that we can arrange for its return to [place]. Phone: [phone], Toll-Free: [phone], Fax: [phone] Page: 2 of 2 Call Id: [id number] [place]. Time Eilene Ghazi Time) Disposition Final User 12/04/2020 12:22:43 PM 911 Outcome Documentation Humfleet, RN,  Estill Bamberg Reason: called 911 12/04/2020 12:16:31 PM Call EMS 911 Now Yes Humfleet, RN, Shelly Coss Disagree/Comply Comply Caller Understands Yes PreDisposition InappropriateToAsk Care Advice Given Per Guideline CALL EMS 911 NOW: CARE ADVICE given per Neurologic Deficit (Adult) guideline. * Immediate medical attention is needed. You need to hang up and call 911 (or an ambulance). Comments User: Rozelle Logan, RN Date/Time Eilene Ghazi Time): 12/04/2020 12:18:12 PM says numbness is more to the right and says right side feels "funny" patient has some slurred speech. when asked about DOB he repeated the year 2 times

## 2020-12-06 ENCOUNTER — Encounter: Payer: Self-pay | Admitting: Medical

## 2020-12-06 ENCOUNTER — Ambulatory Visit (HOSPITAL_BASED_OUTPATIENT_CLINIC_OR_DEPARTMENT_OTHER)
Admission: RE | Admit: 2020-12-06 | Discharge: 2020-12-06 | Disposition: A | Discharge status: Home / Self Care | Payer: Medicare Other | Source: Ambulatory Visit | Attending: Medical | Admitting: Medical

## 2020-12-06 ENCOUNTER — Other Ambulatory Visit: Payer: Self-pay

## 2020-12-06 ENCOUNTER — Ambulatory Visit (INDEPENDENT_AMBULATORY_CARE_PROVIDER_SITE_OTHER): Payer: Medicare Other | Admitting: Medical

## 2020-12-06 ENCOUNTER — Telehealth: Payer: Medicare Other | Admitting: Medical

## 2020-12-06 VITALS — BP 140/80 | HR 64 | Temp 97.9°F | Resp 16 | Ht 75.0 in | Wt 295.4 lb

## 2020-12-06 DIAGNOSIS — I4819 Other persistent atrial fibrillation: Secondary | ICD-10-CM | POA: Diagnosis not present

## 2020-12-06 DIAGNOSIS — R42 Dizziness and giddiness: Secondary | ICD-10-CM

## 2020-12-06 DIAGNOSIS — R202 Paresthesia of skin: Secondary | ICD-10-CM | POA: Insufficient documentation

## 2020-12-06 DIAGNOSIS — E782 Mixed hyperlipidemia: Secondary | ICD-10-CM

## 2020-12-06 DIAGNOSIS — H539 Unspecified visual disturbance: Secondary | ICD-10-CM | POA: Insufficient documentation

## 2020-12-06 DIAGNOSIS — H814 Vertigo of central origin: Secondary | ICD-10-CM | POA: Insufficient documentation

## 2020-12-06 DIAGNOSIS — H8109 Meniere's disease, unspecified ear: Secondary | ICD-10-CM

## 2020-12-06 DIAGNOSIS — R7989 Other specified abnormal findings of blood chemistry: Secondary | ICD-10-CM

## 2020-12-06 DIAGNOSIS — I6529 Occlusion and stenosis of unspecified carotid artery: Secondary | ICD-10-CM

## 2020-12-06 LAB — CBC WITH DIFFERENTIAL/PLATELET
Basophils Absolute: 0.1 10*3/uL (ref 0.0–0.1)
Basophils Relative: 1.3 % (ref 0.0–3.0)
Eosinophils Absolute: 0.1 10*3/uL (ref 0.0–0.7)
Eosinophils Relative: 2.2 % (ref 0.0–5.0)
HCT: 49.3 % (ref 39.0–52.0)
Hemoglobin: 16.3 g/dL (ref 13.0–17.0)
Lymphocytes Relative: 27.6 % (ref 12.0–46.0)
Lymphs Abs: 1.5 10*3/uL (ref 0.7–4.0)
MCHC: 33.1 g/dL (ref 30.0–36.0)
MCV: 100.6 fl — ABNORMAL HIGH (ref 78.0–100.0)
Monocytes Absolute: 0.6 10*3/uL (ref 0.1–1.0)
Monocytes Relative: 11 % (ref 3.0–12.0)
Neutro Abs: 3.1 10*3/uL (ref 1.4–7.7)
Neutrophils Relative %: 57.9 % (ref 43.0–77.0)
Platelets: 155 10*3/uL (ref 150.0–400.0)
RBC: 4.9 Mil/uL (ref 4.22–5.81)
RDW: 14.4 % (ref 11.5–15.5)
WBC: 5.3 10*3/uL (ref 4.0–10.5)

## 2020-12-06 LAB — COMPREHENSIVE METABOLIC PANEL
ALT: 28 U/L (ref 0–53)
AST: 33 U/L (ref 0–37)
Albumin: 4.6 g/dL (ref 3.5–5.2)
Alkaline Phosphatase: 40 U/L (ref 39–117)
BUN: 26 mg/dL — ABNORMAL HIGH (ref 6–23)
CO2: 28 mEq/L (ref 19–32)
Calcium: 9.5 mg/dL (ref 8.4–10.5)
Chloride: 102 mEq/L (ref 96–112)
Creatinine, Ser: 1.18 mg/dL (ref 0.40–1.50)
GFR: 61.7 mL/min (ref >60.00)
Glucose, Bld: 78 mg/dL (ref 70–99)
Potassium: 5.1 mEq/L (ref 3.5–5.1)
Sodium: 137 mEq/L (ref 135–145)
Total Bilirubin: 0.6 mg/dL (ref 0.2–1.2)
Total Protein: 6.8 g/dL (ref 6.0–8.3)

## 2020-12-06 LAB — GLUCOSE, POCT (MANUAL RESULT ENTRY): POC Glucose: 101 mg/dl — AB (ref 70–99)

## 2020-12-06 NOTE — Patient Instructions (Addendum)
History of chronic intermittent dizziness with vertigo.  You have seen various specialists in the past but symptoms persist.  Recently this past Tuesday recurrent dizziness symptoms with transient double vision and paresthesia of left hand.  EMS evaluated and started ED evaluation was not necessary.  Some worsening dizzy sensation at times when he turns his neck.  On review of prior records from mild carotid stenosis on neck CT in 2021.  Based on history and recent presentation decided to go ahead and try to get CT head stat without contrast today.  Also want you to get scheduled hopefully within the next week for carotid ultrasound.   For high cholesterol, please follow recommendation from cardiologist regarding potential Zetia or other medication for cholesterol.  If pending above work-up for signs symptoms worsen or change then recommend ED evaluation.  History of hyperthyroidism and treated with radioactive iodine per your report.  Now borderline low T4 and normal TSH.  On discussion you mention requesting to see specialist.  Placed referral to our endocrinologist at the med center.  Follow-up next Friday or sooner if needed.       Addition time 12 minutes spent as he was feeling dizziness and felt flushed xray staff. I did neuro exam twice post radiology evaluation and was normal. No deficits. Went over ct with pt. Advised pt if he has any worse signs or symptoms then call ems. I don't think necessary to go to ED now. If he feels unstable explained to pt can have him evaluated and call ED now. He indicated did not want to go.   FINDINGS: Brain: No evidence of acute infarction, hemorrhage, hydrocephalus, extra-axial collection or mass lesion/mass effect. Mild periventricular and deep white matter hypodensity.   Vascular: No hyperdense vessel or unexpected calcification.   Skull: Normal. Negative for fracture or focal lesion.   Sinuses/Orbits: No acute finding.   Other: None.    IMPRESSION: No acute intracranial pathology. Small-vessel white matter disease.

## 2020-12-06 NOTE — Addendum Note (Signed)
Addended by: Jeronimo Greaves on: 12/06/2020 02:55 PM   Modules accepted: Orders

## 2020-12-06 NOTE — Progress Notes (Signed)
Subjective:    Patient ID: Michael Whitney, male    DOB: [birth year], 70 y.o.   MRN: [id number]  HPI  Pt in for recent dizziness episode and some blurred vision episodes. This has been constant and intermittent. Though he does have known history of dizziness on chart review. He also some vertigo type episodes on and off for one year. The other day when he had dizziness noted some vision change for seconds. Also some left side paresthesia quickly.   Pt states when he turned his head he almost feel dizziness worsened. 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   Pt states he has seen ent, neurologist and possible PT in the past.   Pt called EMS the other day. They evaluated him and told him ED visit was not necessary.   Pt states meclizine in the past did not help.  Pt has low t4 and normal tsh. He thinks thyroid disorder effecting his balance.  Hx of thyroid nodules. On chart shows hx of hyperthyroidism.  Pt was given methimazole in the past breifly before side effect.     Pt prior ct neck showed in 2021.    Right carotid system: Patent. There is mild calcified and noncalcified plaque at the proximal ICA causing minimal stenosis.   Left carotid system: Patent. Mild calcified and noncalcified plaque along the proximal ICA causing minimal stenosis.   Review of Systems  Constitutional:  Negative for chills, fatigue and fever.  Eyes:  Positive for visual disturbance.  Respiratory:  Negative for cough, chest tightness, shortness of breath and wheezing.   Cardiovascular:  Negative for chest pain and palpitations.  Gastrointestinal:  Negative for abdominal pain.  Genitourinary:  Negative for dysuria and flank pain.  Musculoskeletal:  Negative for back pain, joint swelling, neck pain and neck stiffness.  Skin:  Negative for pallor and rash.  Neurological:  Positive for dizziness.    Past Medical History:  Diagnosis Date   Atrial  fibrillation ([place])    Cataract    Diabetes mellitus without complication ([place])    Hypertension    Seizures ([place])      Social History   Socioeconomic History   Marital status: Widowed    Spouse name: Not on file   Number of children: 2   Years of education: Not on file   Highest education level: Not on file  Occupational History   Occupation: retired    Comment: Geophysicist/field seismologist  Tobacco Use   Smoking status: Never   Smokeless tobacco: Never  Vaping Use   Vaping Use: Never used  Substance and Sexual Activity   Alcohol use: Yes    Alcohol/week: 2.0 standard drinks    Types: 1 Shots of liquor, 1 Cans of beer per week    Comment: occasionally   Drug use: No   Sexual activity: Not on file  Other Topics Concern   Not on file  Social History Narrative   Not on file   Social Determinants of Health   Financial Resource Strain: Not on file  Food Insecurity: Not on file  Transportation Needs: Not on file  Physical Activity: Not on file  Stress: Not on file  Social Connections: Not on file  Intimate Partner Violence: Not on file    Past Surgical History:  Procedure Laterality Date   CATARACT EXTRACTION     PVC ABLATION N/A 07/14/2017   Procedure: PVC ABLATION;  Surgeon: Evans Lance, MD;  Location: [facility];  Service: Cardiovascular;  Laterality: N/A;   RIGHT/LEFT HEART CATH AND CORONARY ANGIOGRAPHY N/A 10/24/2020   Procedure: RIGHT/LEFT HEART CATH AND CORONARY ANGIOGRAPHY;  Surgeon: [name], Peter M, MD;  Location: [facility];  Service: Cardiovascular;  Laterality: N/A;   TONSILLECTOMY     VARICOSE VEIN SURGERY      Family History  Problem Relation Age of Onset   CAD Mother        MI at age 78    No Known Allergies  Current Outpatient Medications on File Prior to Visit  Medication Sig Dispense Refill   Accu-Chek FastClix Lancets MISC Use 1 lancet daily to test blood sugar (E11.9)     azelastine (ASTELIN) 0.1 % nasal spray Place 1 spray into both  nostrils 2 (two) times daily as needed for rhinitis. Use in each nostril as directed 30 mL 12   Cholecalciferol (VITAMIN D-3) 25 MCG (1000 UT) CAPS Take 1,000 Units by mouth daily.     furosemide (LASIX) 20 MG tablet Take 1 tablet (20 mg total) by mouth daily. 30 tablet 2   losartan (COZAAR) 100 MG tablet Take 1/2 tablet ( 50 mg ) daily 90 tablet 3   Multiple Vitamin (MULTI-VITAMIN) tablet Take 1 tablet by mouth daily.     Omega-3 Fatty Acids (FISH OIL) 1000 MG CAPS Take 1,000 mg by mouth daily.     OVER THE COUNTER MEDICATION Take 6 drops by mouth daily. Vitamins A, D, and K     triamcinolone cream (KENALOG) 0.1 % Apply 1 application topically every other day.     vitamin B-12 (CYANOCOBALAMIN) 1000 MCG tablet Take 1,000 mcg by mouth daily.     Vitamin D, Ergocalciferol, (DRISDOL) 1.25 MG (50000 UNIT) CAPS capsule Take 50,000 Units by mouth every Tuesday.     XARELTO 20 MG TABS tablet TAKE 1 TABLET(20 MG) BY MOUTH DAILY WITH SUPPER 90 tablet 1   No current facility-administered medications on file prior to visit.    BP (!) 161/80 (BP Location: Right Arm, Cuff Size: Large)   Pulse 64   Temp 97.9 F (36.6 C) (Oral)   Resp 16   Ht 6\' 3"  (1.905 m)   Wt 295 lb 6.4 oz (134 kg)   SpO2 97%   BMI 36.92 kg/m       Objective:   Physical Exam   General Mental Status- Alert. General Appearance- Not in acute distress.   Skin General: Color- Normal Color. Moisture- Normal Moisture.  Neck Carotid Arteries- Normal color. Moisture- Normal Moisture. No carotid bruits. No JVD.  Chest and Lung Exam Auscultation: Breath Sounds:-Normal.  Cardiovascular Auscultation:Rythm- Regular. Murmurs & Other Heart Sounds:Auscultation of the heart reveals- No Murmurs.  Abdomen Inspection:-Inspeection Normal. Palpation/Percussion:Note:No mass. Palpation and Percussion of the abdomen reveal- Non Tender, Non Distended + BS, no rebound or guarding.    Neurologic Cranial Nerve exam:- CN III-XII  intact(No nystagmus), symmetric smile. Drift Test:- No drift. Romberg Exam:- Negative.  Heal to Toe Gait exam:-Normal. Finger to Nose:- Normal/Intact Strength:- 5/5 equal and symmetric strength both upper and lower extremities.      Assessment & Plan:   Patient Instructions  History of chronic intermittent dizziness with vertigo.  You have seen various specialists in the past but symptoms persist.  Recently this past Tuesday recurrent dizziness symptoms with transient double vision and paresthesia of left hand.  EMS evaluated and started ED evaluation was not necessary.  Some worsening dizzy sensation at times when he turns his  neck.  On review of prior records from mild carotid stenosis on neck CT in 2021.  Based on history and recent presentation decided to go ahead and try to get CT head stat without contrast today.  Also want you to get scheduled hopefully within the next week for carotid ultrasound.   For high cholesterol, please follow recommendation from cardiologist regarding potential Zetia or other medication for cholesterol.  If pending above work-up for signs symptoms worsen or change then recommend ED evaluation.  History of hyperthyroidism and treated with radioactive iodine per your report.  Now borderline low T4 and normal TSH.  On discussion you mention requesting to see specialist.  Placed referral to our endocrinologist at the med center.  Follow-up next Friday or sooner if needed.    Time spent with patient today was 54  minutes which consisted of chart revdiew, discussing diagnosis, work up treatment and documentation.

## 2020-12-07 ENCOUNTER — Telehealth: Payer: Self-pay | Admitting: Medical

## 2020-12-07 NOTE — Telephone Encounter (Signed)
Please give this pt 40 minute appointment. New pt with multiple medical problems. Seen twice so far. 20 minutes slot not adequate. Will fall behind with this pt. Please put not in chart when pt scheduled.  He has appointment upcoming Friday.

## 2020-12-09 ENCOUNTER — Telehealth: Payer: Self-pay | Admitting: Medical

## 2020-12-09 NOTE — Telephone Encounter (Signed)
Correction... appt on this Friday, 10/21 has been updated to 40 minutes.

## 2020-12-09 NOTE — Telephone Encounter (Signed)
Pt. Called in and stated he is not doing well. He is so dizzy he can hardly sit up. His blood pressures haven't been lower than 142 and his highest was in the 170's. Transferred to triage for further advice.

## 2020-12-09 NOTE — Telephone Encounter (Signed)
Pt's appt for Friday, 12/21 has been updated to 40 mins and modifiers for future appts have been updated and set to 40 min appts.

## 2020-12-10 ENCOUNTER — Emergency Department (HOSPITAL_COMMUNITY): Payer: Medicare Other

## 2020-12-10 ENCOUNTER — Other Ambulatory Visit (HOSPITAL_BASED_OUTPATIENT_CLINIC_OR_DEPARTMENT_OTHER): Payer: Self-pay

## 2020-12-10 ENCOUNTER — Observation Stay (HOSPITAL_COMMUNITY)
Admission: EM | Admit: 2020-12-10 | Discharge: 2020-12-12 | Disposition: A | Discharge status: Home / Self Care | Payer: Medicare Other | Attending: Interventional Cardiology | Admitting: Interventional Cardiology

## 2020-12-10 ENCOUNTER — Encounter (HOSPITAL_COMMUNITY): Payer: Self-pay | Admitting: Pharmacy Technician

## 2020-12-10 DIAGNOSIS — R0609 Other forms of dyspnea: Secondary | ICD-10-CM

## 2020-12-10 DIAGNOSIS — I2510 Atherosclerotic heart disease of native coronary artery without angina pectoris: Secondary | ICD-10-CM

## 2020-12-10 DIAGNOSIS — I4821 Permanent atrial fibrillation: Secondary | ICD-10-CM | POA: Insufficient documentation

## 2020-12-10 DIAGNOSIS — Z20822 Contact with and (suspected) exposure to covid-19: Secondary | ICD-10-CM | POA: Diagnosis not present

## 2020-12-10 DIAGNOSIS — Z7901 Long term (current) use of anticoagulants: Secondary | ICD-10-CM | POA: Diagnosis not present

## 2020-12-10 DIAGNOSIS — I10 Essential (primary) hypertension: Secondary | ICD-10-CM | POA: Insufficient documentation

## 2020-12-10 DIAGNOSIS — H532 Diplopia: Secondary | ICD-10-CM | POA: Diagnosis present

## 2020-12-10 DIAGNOSIS — Z79899 Other long term (current) drug therapy: Secondary | ICD-10-CM | POA: Diagnosis not present

## 2020-12-10 DIAGNOSIS — I272 Pulmonary hypertension, unspecified: Secondary | ICD-10-CM

## 2020-12-10 DIAGNOSIS — I5032 Chronic diastolic (congestive) heart failure: Secondary | ICD-10-CM | POA: Insufficient documentation

## 2020-12-10 DIAGNOSIS — R778 Other specified abnormalities of plasma proteins: Secondary | ICD-10-CM | POA: Diagnosis not present

## 2020-12-10 DIAGNOSIS — E039 Hypothyroidism, unspecified: Secondary | ICD-10-CM | POA: Diagnosis not present

## 2020-12-10 DIAGNOSIS — I214 Non-ST elevation (NSTEMI) myocardial infarction: Secondary | ICD-10-CM

## 2020-12-10 DIAGNOSIS — E119 Type 2 diabetes mellitus without complications: Secondary | ICD-10-CM | POA: Insufficient documentation

## 2020-12-10 DIAGNOSIS — E118 Type 2 diabetes mellitus with unspecified complications: Secondary | ICD-10-CM

## 2020-12-10 DIAGNOSIS — R42 Dizziness and giddiness: Principal | ICD-10-CM | POA: Insufficient documentation

## 2020-12-10 DIAGNOSIS — E785 Hyperlipidemia, unspecified: Secondary | ICD-10-CM

## 2020-12-10 LAB — BASIC METABOLIC PANEL
Anion gap: 10 (ref 5–15)
BUN: 23 mg/dL (ref 8–23)
CO2: 27 mmol/L (ref 22–32)
Calcium: 10.1 mg/dL (ref 8.9–10.3)
Chloride: 101 mmol/L (ref 98–111)
Creatinine, Ser: 1.2 mg/dL (ref 0.61–1.24)
GFR, Estimated: >60 mL/min (ref >60)
Glucose, Bld: 107 mg/dL — ABNORMAL HIGH (ref 70–99)
Potassium: 4.5 mmol/L (ref 3.5–5.1)
Sodium: 138 mmol/L (ref 135–145)

## 2020-12-10 LAB — CBC WITH DIFFERENTIAL/PLATELET
Abs Immature Granulocytes: 0.02 10*3/uL (ref 0.00–0.07)
Basophils Absolute: 0.1 10*3/uL (ref 0.0–0.1)
Basophils Relative: 1 %
Eosinophils Absolute: 0.1 10*3/uL (ref 0.0–0.5)
Eosinophils Relative: 1 %
HCT: 51 % (ref 39.0–52.0)
Hemoglobin: 16.9 g/dL (ref 13.0–17.0)
Immature Granulocytes: 0 %
Lymphocytes Relative: 21 %
Lymphs Abs: 1.6 10*3/uL (ref 0.7–4.0)
MCH: 33 pg (ref 26.0–34.0)
MCHC: 33.1 g/dL (ref 30.0–36.0)
MCV: 99.6 fL (ref 80.0–100.0)
Monocytes Absolute: 0.6 10*3/uL (ref 0.1–1.0)
Monocytes Relative: 8 %
Neutro Abs: 5 10*3/uL (ref 1.7–7.7)
Neutrophils Relative %: 69 %
Platelets: 177 10*3/uL (ref 150–400)
RBC: 5.12 MIL/uL (ref 4.22–5.81)
RDW: 13.1 % (ref 11.5–15.5)
WBC: 7.3 10*3/uL (ref 4.0–10.5)
nRBC: 0 % (ref 0.0–0.2)

## 2020-12-10 MED ORDER — IOHEXOL 300 MG/ML  SOLN
65.0000 mL | Freq: Once | INTRAMUSCULAR | Status: AC | PRN
  Administered 2020-12-10: 65 mL via INTRAVENOUS

## 2020-12-10 MED ORDER — COVID-19MRNA BIVAL VACC PFIZER 30 MCG/0.3ML IM SUSP
INTRAMUSCULAR | 0 refills | Status: DC
  Filled 2020-12-10: qty 0.3, 1d supply, fill #0

## 2020-12-10 NOTE — ED Provider Notes (Signed)
[facility] Emergency Department Provider Note MRN:  [id number]  Arrival date & time: 12/11/20     Chief Complaint   Double vision History of Present Illness   Michael Whitney is a 70 y.o. year-old male with a history of hypertension, diabetes, A. fib presenting to the ED with chief complaint of double vision.  Patient endorsing intermittent double vision, dizziness, trouble walking due to balance issues for the past few days.  Initially mild but over the past 48 hours much more severe.  Dizziness does seem to be triggered by certain movements of the head.  Denies headache, no visual field loss, no trouble speaking, no trouble swallowing, also endorsing right neck pain which is more chronic, has been present on and off for several weeks.  Noted some mild transient chest pressure earlier today, no shortness of breath, no abdominal pain, no numbness or weakness to the arms or legs.  Sent here for stroke work-up.  Symptoms intermittent, moderate, no other exacerbating or alleviating factors.  Review of Systems  A complete 10 system review of systems was obtained and all systems are negative except as noted in the HPI and PMH.   Patient's Health History    Past Medical History:  Diagnosis Date   Atrial fibrillation ([place])    Cataract    Diabetes mellitus without complication ([place])    Hypertension    Seizures ([place])     Past Surgical History:  Procedure Laterality Date   CATARACT EXTRACTION     PVC ABLATION N/A 07/14/2017   Procedure: PVC ABLATION;  Surgeon: Evans Lance, MD;  Location: [facility];  Service: Cardiovascular;  Laterality: N/A;   RIGHT/LEFT HEART CATH AND CORONARY ANGIOGRAPHY N/A 10/24/2020   Procedure: RIGHT/LEFT HEART CATH AND CORONARY ANGIOGRAPHY;  Surgeon: [name], Peter M, MD;  Location: [facility];  Service: Cardiovascular;  Laterality: N/A;   TONSILLECTOMY     VARICOSE VEIN SURGERY      Family History  Problem Relation Age of  Onset   CAD Mother        MI at age 74    Social History   Socioeconomic History   Marital status: Widowed    Spouse name: Not on file   Number of children: 2   Years of education: Not on file   Highest education level: Not on file  Occupational History   Occupation: retired    Comment: Geophysicist/field seismologist  Tobacco Use   Smoking status: Never   Smokeless tobacco: Never  Vaping Use   Vaping Use: Never used  Substance and Sexual Activity   Alcohol use: Yes    Alcohol/week: 2.0 standard drinks    Types: 1 Shots of liquor, 1 Cans of beer per week    Comment: occasionally   Drug use: No   Sexual activity: Not on file  Other Topics Concern   Not on file  Social History Narrative   Not on file   Social Determinants of Health   Financial Resource Strain: Not on file  Food Insecurity: Not on file  Transportation Needs: Not on file  Physical Activity: Not on file  Stress: Not on file  Social Connections: Not on file  Intimate Partner Violence: Not on file     Physical Exam   Vitals:   12/11/20 0045 12/11/20 0100  BP: 140/73 136/67  Pulse: (!) 48 (!) 54  Resp: 15 12  Temp:    SpO2: 97% 96%    CONSTITUTIONAL: Well-appearing,  NAD NEURO:  Alert and oriented x 3, normal and symmetric strength and sensation, normal coordination, normal speech EYES:  eyes equal and reactive, normal extraocular movements, no visual field cuts ENT/NECK:  no LAD, no JVD CARDIO: Regular rate, well-perfused, normal S1 and S2 PULM:  CTAB no wheezing or rhonchi GI/GU:  normal bowel sounds, non-distended, non-tender MSK/SPINE:  No gross deformities, no edema SKIN:  no rash, atraumatic PSYCH:  Appropriate speech and behavior  *Additional and/or pertinent findings included in MDM below  Diagnostic and Interventional Summary    EKG Interpretation  Date/Time:  Tuesday December 10 2020 15:32:32 EDT Ventricular Rate:  60 PR Interval:    QRS Duration: 128 QT Interval:  412 QTC Calculation: 412 R  Axis:   -56 Text Interpretation: Atrial fibrillation Left axis deviation Right bundle branch block Inferior infarct , age undetermined Abnormal ECG Confirmed by Gerlene Fee [phone]) on 12/10/2020 11:21:56 PM       Labs Reviewed  BASIC METABOLIC PANEL - Abnormal; Notable for the following components:      Result Value   Glucose, Bld 107 (*)    All other components within normal limits  TROPONIN I (HIGH SENSITIVITY) - Abnormal; Notable for the following components:   Troponin I (High Sensitivity) 251 (*)    All other components within normal limits  CBC WITH DIFFERENTIAL/PLATELET  URINALYSIS, ROUTINE W REFLEX MICROSCOPIC  TROPONIN I (HIGH SENSITIVITY)    MR BRAIN WO CONTRAST  Final Result    CT ANGIO HEAD NECK W WO CM  Final Result      Medications  aspirin chewable tablet 324 mg (has no administration in time range)  iohexol (OMNIPAQUE) 300 MG/ML solution 65 mL (65 mLs Intravenous Contrast Given 12/10/20 1804)     Procedures  /  Critical Care .Critical Care Performed by: Maudie Flakes, MD Authorized by: Maudie Flakes, MD   Critical care provider statement:    Critical care time (minutes):  35   Critical care was necessary to treat or prevent imminent or life-threatening deterioration of the following conditions: NSTEMI.   Critical care was time spent personally by me on the following activities:  Development of treatment plan with patient or surrogate, discussions with consultants, ordering and performing treatments and interventions, ordering and review of laboratory studies, ordering and review of radiographic studies and re-evaluation of patient's condition   Care discussed with: admitting provider    ED Course and Medical Decision Making  I have reviewed the triage vital signs, the nursing notes, and pertinent available records from the EMR.  Listed above are laboratory and imaging tests that I personally ordered, reviewed, and interpreted and then considered in my  medical decision making (see below for details).  Concern for acute ischemic stroke given the double vision, possible ataxia, possible central vertigo.  Could also be explained by peripheral vertigo.  Neck pain is more chronic but carotid artery dissection is also considered.  CTA imaging of the head and neck was obtained, no dissection, no obvious stroke or bleeding, there is comment on significant stenosis of the right carotid artery.  Patient has multiple risk factors for stroke, will obtain MRI here in the emergency department and reassess.     MRI is negative for stroke.  Troponin has returned at 250.  Upon further questioning, patient had some chest tightness, mild in severity earlier this afternoon, lasted about 2 hours and then resolved.  No significant associated symptoms.  Suspect patient has peripheral vertigo but now with  this chest pain and elevated troponin, there is concern for NSTEMI.  Patient does have CAD with recent catheterization showing diffuse mild disease.  Discussed case with cardiology, will come to evaluate for admission.  Providing aspirin.  Barth Kirks. Sedonia Small, MD [place] [email or web address]  Final Clinical Impressions(s) / ED Diagnoses     ICD-10-CM   1. NSTEMI (non-ST elevated myocardial infarction) [facility])  I21.4       ED Discharge Orders     None        Discharge Instructions Discussed with and Provided to Patient:   Discharge Instructions   None       Maudie Flakes, MD 12/11/20 0128

## 2020-12-10 NOTE — Telephone Encounter (Signed)
Pt states every time he moves he feels dizzy , and his right side of neck is really sore , and when he touches it he feels like his neck goes numb and when he turns too fast his vision goes blurry. Patient wants to know if his thyroid could be causing this problem and he doesn't believe he is having a stroke because he only has weirdness when he touches his neck.    Stated he will go to the ER after the people leave from cleaning up his bathroom around 3/4 .

## 2020-12-10 NOTE — ED Notes (Signed)
Patient transported to MRI 

## 2020-12-10 NOTE — ED Notes (Addendum)
Pt resting on stretcher, no acute changes noted. Will continue to monitor.

## 2020-12-10 NOTE — ED Triage Notes (Signed)
Pt here with reports of near syncope and double vision for "a while". Pt also with neck pain. Pt pcp concerned for decreased blood flow.

## 2020-12-10 NOTE — ED Provider Notes (Addendum)
Emergency Medicine Provider Triage Evaluation Note  Michael Whitney , a 70 y.o. male  was evaluated in triage.  Pt complains of multiple weeks of blurred, double vision and presyncope.  Patient with atrial fibrillation, rate controlled.  Also with pain radiating up and down neck  Review of Systems  Positive: Balance problems, diplopia Negative: Chest pain  Physical Exam  BP (!) 146/78 (BP Location: Left Arm)   Pulse 79   Temp 98.1 F (36.7 C) (Oral)   Resp 18   SpO2 93%  Gen:   Awake, no distress   Resp:  Normal effort  MSK:   Moves extremities without difficulty  Other:  A. fib, normal rate  Medical Decision Making  Medically screening exam initiated at 3:34 PM.  Appropriate orders placed.  Michael Whitney was informed that the remainder of the evaluation will be completed by another provider, this initial triage assessment does not replace that evaluation, and the importance of remaining in the ED until their evaluation is complete.   Rule out CVA   Michael Hammock, PA-C 12/10/20 1536    Michael Whitney, Cecilio Asper, PA-C 12/10/20 1538    Daleen Bo, MD 12/10/20 1945

## 2020-12-10 NOTE — Telephone Encounter (Signed)
Nurse Assessment Nurse: Linward Headland, RN, Ana Date/Time (Eastern Time): 12/09/2020 4:45:32 PM Confirm and document reason for call. If symptomatic, describe symptoms. ---Caller states he is having weakness and dizziness that started a while back, has gotten worse the past few days. Currently bp is 147/80 pulse is 59. States it has gotten as high 172/86 earlier today. States he was seen on Friday for these symptoms and he was set up for a CT scan and while he was getting the scan done he felt really weak and almost passed out. He says he is not feeling any better. Says he has an ultrasound pending that he was unable to get done as of yet. Does the patient have any new or worsening symptoms? ---Yes Will a triage be completed? ---Yes Related visit to physician within the last 2 weeks? ---Yes Does the PT have any chronic conditions? (i.e. diabetes, asthma, this includes High risk factors for pregnancy, etc.) ---Yes List chronic conditions. ---HTN, Afib, hashimoto's Is this a behavioral health or substance abuse call? ---No Guidelines Guideline Title Affirmed Question Affirmed Notes Nurse Date/Time (Eastern Time) Dizziness - Lightheadedness Taking a medicine that could cause dizziness (e.g., blood Linward Headland, RN, Ana 12/09/2020 4:49:12 PM PLEASE NOTE: All timestamps contained within this report are represented as [place] Standard Time. CONFIDENTIALTY NOTICE: This fax transmission is intended only for the addressee. It contains information that is legally privileged, confidential or otherwise protected from use or disclosure. If you are not the intended recipient, you are strictly prohibited from reviewing, disclosing, copying using or disseminating any of this information or taking any action in reliance on or regarding this information. If you have received this fax in error, please notify us immediately by telephone so that we can arrange for its return to [place]. Phone: [phone],  Toll-Free: [phone], Fax: [phone] Page: 2 of 2 Call Id: [id number] Guidelines Guideline Title Affirmed Question Affirmed Notes Nurse Date/Time Eilene Ghazi Time) pressure medications, diuretics) Disp. Time Eilene Ghazi Time) Disposition Final User 12/09/2020 4:59:35 PM See PCP within 24 Hours Yes Linward Headland, RN, [place] Disagree/Comply Comply Caller Understands Yes PreDisposition Call Doctor Care Advice Given Per Guideline SEE PCP WITHIN 24 HOURS: * IF OFFICE WILL BE OPEN: You need to be examined within the next 24 hours. Call your doctor (or NP/PA) when the office opens and make an appointment. CALL BACK IF: * Passes out (faints) * You become worse CARE ADVICE given per Dizziness (Adult) guideline. MEDICINE AS A CAUSE: * Your medicine may or may not be causing your dizziness. Your doctor can help you decide. LIE DOWN AND REST: * Lie down with feet elevated for 1 hour. * This will improve circulation and increase blood flow to the brain. DRINK FLUIDS: * Drink several glasses of fruit juice, other clear fluids or water. Comments User: Jodelle Green, RN Date/Time Eilene Ghazi Time): 12/09/2020 4:53:49 PM current bp 166/97 pr 65 User: Jodelle Green, RN Date/Time Eilene Ghazi Time): 12/09/2020 4:54:43 PM States he takes Losartan 50 mg for blood pressure, was recently adjusted to see if that helped but it has not Referrals REFERRED TO PCP OFFICE

## 2020-12-11 ENCOUNTER — Other Ambulatory Visit: Payer: Self-pay

## 2020-12-11 DIAGNOSIS — R778 Other specified abnormalities of plasma proteins: Secondary | ICD-10-CM | POA: Diagnosis not present

## 2020-12-11 DIAGNOSIS — I739 Peripheral vascular disease, unspecified: Secondary | ICD-10-CM

## 2020-12-11 DIAGNOSIS — Z7901 Long term (current) use of anticoagulants: Secondary | ICD-10-CM

## 2020-12-11 DIAGNOSIS — I25118 Atherosclerotic heart disease of native coronary artery with other forms of angina pectoris: Secondary | ICD-10-CM | POA: Diagnosis not present

## 2020-12-11 DIAGNOSIS — I4819 Other persistent atrial fibrillation: Secondary | ICD-10-CM

## 2020-12-11 DIAGNOSIS — I214 Non-ST elevation (NSTEMI) myocardial infarction: Secondary | ICD-10-CM | POA: Insufficient documentation

## 2020-12-11 DIAGNOSIS — R42 Dizziness and giddiness: Secondary | ICD-10-CM | POA: Diagnosis not present

## 2020-12-11 LAB — URINALYSIS, ROUTINE W REFLEX MICROSCOPIC
Bacteria, UA: NONE SEEN
Bilirubin Urine: NEGATIVE
Glucose, UA: NEGATIVE mg/dL
Ketones, ur: NEGATIVE mg/dL
Leukocytes,Ua: NEGATIVE
Nitrite: NEGATIVE
Protein, ur: NEGATIVE mg/dL
Specific Gravity, Urine: 1.019 (ref 1.005–1.030)
pH: 7 (ref 5.0–8.0)

## 2020-12-11 LAB — CREATININE, SERUM
Creatinine, Ser: 1.19 mg/dL (ref 0.61–1.24)
GFR, Estimated: >60 mL/min (ref >60)

## 2020-12-11 LAB — RESP PANEL BY RT-PCR (FLU A&B, COVID) ARPGX2
Influenza A by PCR: NEGATIVE
Influenza B by PCR: NEGATIVE
SARS Coronavirus 2 by RT PCR: NEGATIVE

## 2020-12-11 LAB — CBC
HCT: 49.2 % (ref 39.0–52.0)
Hemoglobin: 16.6 g/dL (ref 13.0–17.0)
MCH: 33.1 pg (ref 26.0–34.0)
MCHC: 33.7 g/dL (ref 30.0–36.0)
MCV: 98.2 fL (ref 80.0–100.0)
Platelets: 159 10*3/uL (ref 150–400)
RBC: 5.01 MIL/uL (ref 4.22–5.81)
RDW: 13.2 % (ref 11.5–15.5)
WBC: 5.7 10*3/uL (ref 4.0–10.5)
nRBC: 0 % (ref 0.0–0.2)

## 2020-12-11 LAB — TROPONIN I (HIGH SENSITIVITY)
Troponin I (High Sensitivity): 251 ng/L (ref <18)
Troponin I (High Sensitivity): 232 ng/L (ref <18)
Troponin I (High Sensitivity): 216 ng/L (ref <18)

## 2020-12-11 MED ORDER — OMEGA-3-ACID ETHYL ESTERS 1 G PO CAPS
1.0000 g | ORAL_CAPSULE | Freq: Every day | ORAL | Status: DC
  Administered 2020-12-11 – 2020-12-12 (×2): 1 g via ORAL
  Filled 2020-12-11 (×2): qty 1

## 2020-12-11 MED ORDER — ONDANSETRON HCL 4 MG/2ML IJ SOLN: 4.0000 mg | Freq: Four times a day (QID) | INTRAMUSCULAR | Status: DC | PRN

## 2020-12-11 MED ORDER — HEPARIN SODIUM (PORCINE) 5000 UNIT/ML IJ SOLN
5000.0000 [IU] | Freq: Three times a day (TID) | INTRAMUSCULAR | Status: DC
  Administered 2020-12-11 – 2020-12-12 (×3): 5000 [IU] via SUBCUTANEOUS
  Filled 2020-12-11 (×3): qty 1

## 2020-12-11 MED ORDER — ASPIRIN 81 MG PO CHEW
324.0000 mg | CHEWABLE_TABLET | Freq: Once | ORAL | Status: AC
  Administered 2020-12-11: 324 mg via ORAL
  Filled 2020-12-11: qty 4

## 2020-12-11 MED ORDER — NITROGLYCERIN 0.4 MG SL SUBL: 0.4000 mg | SUBLINGUAL_TABLET | SUBLINGUAL | Status: DC | PRN

## 2020-12-11 MED ORDER — ADULT MULTIVITAMIN W/MINERALS CH
1.0000 | ORAL_TABLET | Freq: Every day | ORAL | Status: DC
  Administered 2020-12-11 – 2020-12-12 (×2): 1 via ORAL
  Filled 2020-12-11 (×2): qty 1

## 2020-12-11 MED ORDER — VITAMIN B-12 1000 MCG PO TABS
1000.0000 ug | ORAL_TABLET | Freq: Every day | ORAL | Status: DC
  Administered 2020-12-11 – 2020-12-12 (×2): 1000 ug via ORAL
  Filled 2020-12-11 (×2): qty 1

## 2020-12-11 MED ORDER — ACETAMINOPHEN 325 MG PO TABS: 650.0000 mg | ORAL_TABLET | ORAL | Status: DC | PRN

## 2020-12-11 MED ORDER — LOSARTAN POTASSIUM 50 MG PO TABS
50.0000 mg | ORAL_TABLET | Freq: Every day | ORAL | Status: DC
  Administered 2020-12-11 – 2020-12-12 (×2): 50 mg via ORAL
  Filled 2020-12-11 (×2): qty 1

## 2020-12-11 NOTE — ED Notes (Addendum)
RN took report and accepted pt.

## 2020-12-11 NOTE — ED Notes (Signed)
Lab called with critical value: Troponin 251. MD notified, waiting for further orders.

## 2020-12-11 NOTE — Plan of Care (Signed)
  Problem: Education: Goal: Knowledge of General Education information will improve Description Including pain rating scale, medication(s)/side effects and non-pharmacologic comfort measures Outcome: Progressing   Problem: Health Behavior/Discharge Planning: Goal: Ability to manage health-related needs will improve Outcome: Progressing   

## 2020-12-11 NOTE — ED Notes (Signed)
Pt remains in ED awaiting cardiology consult.  Consult orders placed @ 0111 and 0731.  Charge RN spoke with Trish, Cardiology and pt is on list and will be seen next.  Primary RN and EDP updated.

## 2020-12-11 NOTE — ED Notes (Signed)
Patient is resting comfortably. 

## 2020-12-11 NOTE — ED Notes (Signed)
Pt back from MRI. No acute changes noted. Pt given warm blanket. Will continue to monitor.

## 2020-12-11 NOTE — H&P (Addendum)
Cardiology Admission History and Physical:   Patient ID: Michael Whitney MRN: [id number]; DOB: 10/04/1950   Admission date: 12/10/2020  PCP:  Mackie Pai, [facility] Cardiologist:  Peter [name], MD  Electrophysiologist:  Cristopher Peru, MD  {  Chief Complaint:  Dizziness  Patient Profile:   Michael Whitney is a 70 y.o. male with of permanent atrial fibrillation, hypertension, hypothyroidism/Hashimoto thyroiditis, history of seizure, diabetes mellitus, chronic dizziness and valvular heart disease who is being seen 12/11/2020 for the evaluation of elevated troponin.  Diagnosed with atrial fibrillation in 2019.  EP study by Dr. Lovena Le revealing PVCs arising near His bundle and ablation was not recommended due to potential for heart block.  He has been maintained on a rate control therapy with anticoagulation.   He has longstanding history of chronic dizziness.  Has been unrelated to changes in blood pressure or heart rate.  Has been thought to be due to vertigo and has followed with neurology.  He has completed some element of physical therapy with minimal improvement   Right and left cardiac catheterization 10/24/2020 showing nonobstructive coronary artery disease, minimally elevated left heart filling pressures, mild pulmonary hypertension, normal cardiac output.  He was on appropriate diuretic therapy and noted to have some component of diastolic heart failure.  Recommendation was to consider evaluating possible pulmonary cause of dyspnea.  A PYP scan to evaluate for possible amyloid could be considered.  History of Present Illness:   Michael Whitney has chronic dizziness.  Recently it has worsened.  Previously more 40s now now consistent.  He is also dealing with blurred/double vision for past 1 month.  His symptoms worsen for past few days leading to ER evaluation.  Patient denies loss of vision, headache or trouble swallowing.  He has balance issue.  Few weeks ago he had  episode of substernal chest tightness lasting for few hours.  No chest pain since then.  IMPRESSION: CTA head:   1. No large vessel occlusion or proximal hemodynamically significant stenosis. 2. Small (1 mm) outpouching arising from the supraclinoid right ICAz, compatible with aneurysm or infundibulum.   CTA neck:   1. 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 2. Approximately 50% stenosis of the mid common carotid artery.  MR of brain without acute intracranial process.  His troponin returned elevated and cardiology is called for further evaluation. 251>>232>>216.   Past Medical History:  Diagnosis Date   Atrial fibrillation ([place])    Cataract    Diabetes mellitus without complication ([place])    Hypertension    Seizures ([place])     Past Surgical History:  Procedure Laterality Date   CATARACT EXTRACTION     PVC ABLATION N/A 07/14/2017   Procedure: PVC ABLATION;  Surgeon: Evans Lance, MD;  Location: [facility];  Service: Cardiovascular;  Laterality: N/A;   RIGHT/LEFT HEART CATH AND CORONARY ANGIOGRAPHY N/A 10/24/2020   Procedure: RIGHT/LEFT HEART CATH AND CORONARY ANGIOGRAPHY;  Surgeon: [name], Peter M, MD;  Location: [facility];  Service: Cardiovascular;  Laterality: N/A;   TONSILLECTOMY     VARICOSE VEIN SURGERY       Medications Prior to Admission: Prior to Admission medications   Medication Sig Start Date End Date Taking? Authorizing Provider  Accu-Chek FastClix Lancets MISC Use 1 lancet daily to test blood sugar (E11.9) 08/12/17  Yes [provider]  acetaminophen (TYLENOL) 500 MG tablet Take 1,000 mg by mouth every 6 (six) hours as needed for  moderate pain or headache.   Yes [provider]  Artificial Tear Ointment (DRY EYES OP) Place 1 drop into both eyes daily.   Yes [provider]  azelastine (ASTELIN) 0.1 % nasal spray Place 1 spray into both nostrils 2 (two) times daily as needed for rhinitis. Use in each  nostril as directed 05/13/20  Yes [name], Peter M, MD  Cholecalciferol (VITAMIN D-3) 25 MCG (1000 UT) CAPS Take 1,000 Units by mouth daily.   Yes [provider]  losartan (COZAAR) 100 MG tablet Take 50 mg by mouth daily. 11/28/20  Yes [name], Peter M, MD  Multiple Vitamin (MULTI-VITAMIN) tablet Take 1 tablet by mouth daily.   Yes [provider]  Omega-3 Fatty Acids (FISH OIL) 1000 MG CAPS Take 1,000 mg by mouth daily.   Yes [provider]  OVER THE COUNTER MEDICATION Take 6 drops by mouth daily. Vitamins A, D, and K   Yes [provider]  triamcinolone cream (KENALOG) 0.1 % Apply 1 application topically 3 (three) times a week.   Yes [provider]  vitamin B-12 (CYANOCOBALAMIN) 1000 MCG tablet Take 1,000 mcg by mouth daily.   Yes [provider]  Vitamin D, Ergocalciferol, (DRISDOL) 1.25 MG (50000 UNIT) CAPS capsule Take 50,000 Units by mouth every Tuesday.   Yes [provider]  XARELTO 20 MG TABS tablet TAKE 1 TABLET(20 MG) BY MOUTH DAILY WITH SUPPER Patient taking differently: Take 20 mg by mouth daily. 11/05/20  Yes Loel Dubonnet, NP  COVID-19 mRNA bivalent vaccine, Pfizer, injection Inject into the muscle. 11/29/20   Carlyle Basques, MD  furosemide (LASIX) 20 MG tablet Take 1 tablet (20 mg total) by mouth daily. Patient not taking: Reported on 12/10/2020 10/16/20 01/14/21  Loel Dubonnet, NP     Allergies:   No Active Allergies  Social History:   Social History   Socioeconomic History   Marital status: Widowed    Spouse name: Not on file   Number of children: 2   Years of education: Not on file   Highest education level: Not on file  Occupational History   Occupation: retired    Comment: Geophysicist/field seismologist  Tobacco Use   Smoking status: Never   Smokeless tobacco: Never  Vaping Use   Vaping Use: Never used  Substance and Sexual Activity   Alcohol use: Yes    Alcohol/week: 2.0 standard drinks    Types: 1 Shots of  liquor, 1 Cans of beer per week    Comment: occasionally   Drug use: No   Sexual activity: Not on file  Other Topics Concern   Not on file  Social History Narrative   Not on file   Social Determinants of Health   Financial Resource Strain: Not on file  Food Insecurity: Not on file  Transportation Needs: Not on file  Physical Activity: Not on file  Stress: Not on file  Social Connections: Not on file  Intimate Partner Violence: Not on file    Family History:   The patient's family history includes CAD in his mother.    ROS:  Please see the history of present illness.  All other ROS reviewed and negative.     Physical Exam/Data:   Vitals:   12/11/20 0845 12/11/20 0930 12/11/20 1015 12/11/20 1100  BP: (!) 145/67 134/75 (!) 127/44 135/70  Pulse: (!) 50 (!) 55  64  Resp: 12 12 16 16   Temp:      TempSrc:  SpO2: 96% 96% 92% 94%    Intake/Output Summary (Last 24 hours) at 12/11/2020 1242 Last data filed at 12/11/2020 0758 Gross per 24 hour  Intake --  Output 550 ml  Net -550 ml   Last 3 Weights 12/06/2020 11/29/2020 11/07/2020  Weight (lbs) 295 lb 6.4 oz 295 lb 291 lb  Weight (kg) 133.993 kg 133.811 kg 131.997 kg     There is no height or weight on file to calculate BMI.  General:  Well nourished, well developed, in no acute distress HEENT: normal Neck: no JVD Vascular: No carotid bruits; Distal pulses 2+ bilaterally   Cardiac:  normal S1, S2; RRR; no murmur  Lungs:  clear to auscultation bilaterally, no wheezing, rhonchi or rales  Abd: soft, nontender, no hepatomegaly  Ext: no edema Musculoskeletal:  No deformities, BUE and BLE strength normal and equal Skin: warm and dry  Neuro:  CNs 2-12 intact, no focal abnormalities noted Psych:  Normal affect    EKG:  The ECG that was done yesterday  was personally reviewed and demonstrates atrial fibrillation   Relevant CV Studies:  [facility] 10/24/20   Ost LM to Mid LM lesion is 30% stenosed.   Prox LAD lesion is 30%  stenosed.   Mid Cx lesion is 45% stenosed.   Prox RCA to Mid RCA lesion is 30% stenosed.   The left ventricular systolic function is normal.   LV end diastolic pressure is mildly elevated.   The left ventricular ejection fraction is 55-65% by visual estimate.   Hemodynamic findings consistent with mild pulmonary hypertension.   There is no aortic valve stenosis.   There is trivial (1+) mitral regurgitation.   Nonobstructive CAD Minimally elevated left heart filling pressures.  Mild pulmonary HTN. Normal cardiac output.   Plan: patient is on appropriate diuretic therapy (considering he was hydrated prior to cath). Would consider evaluating possible pulmonary cause of his dyspnea. He does have some component of diastolic CHF. May consider PYP scan to evaluate for possible amyloid   Echo 10/02/20 The left ventricular size is normal. There is severe concentric left  ventricular hypertrophy. The left ventricular wall motion is normal.  Left ventricular systolic function is normal. LV ejection fraction =  55-60%.  Left ventricular filling pattern is pseudonormal. LAP is  indeterminate.  The right ventricle is moderate to severely dilated. The right  ventricular systolic function is moderately reduced.  The atria are severely dilated.  There is mild to moderate mitral regurgitation.  There is moderate tricuspid regurgitation.  Mild to moderate pulmonary hypertension.  Estimated right ventricular systolic pressure is 46 mmHg.  The IVC is dilated with an abnormal collapsibility index, this  suggestive of increased right atrial pressure.   Laboratory Data:  High Sensitivity Troponin:   Recent Labs  Lab 12/10/20 2329 12/11/20 0151 12/11/20 0402  TROPONINIHS 251* 232* 216*      Chemistry Recent Labs  Lab 12/06/20 1346 12/10/20 1542  NA 137 138  K 5.1 4.5  CL 102 101  CO2 28 27  GLUCOSE 78 107*  BUN 26* 23  CREATININE 1.18 1.20  CALCIUM 9.5 10.1  GFRNONAA  --  >60   ANIONGAP  --  10    Recent Labs  Lab 12/06/20 1346  PROT 6.8  ALBUMIN 4.6  AST 33  ALT 28  ALKPHOS 40  BILITOT 0.6   Lipids No results for input(s): CHOL, TRIG, HDL, LABVLDL, LDLCALC, CHOLHDL in the last 168 hours. Hematology Recent Labs  Lab 12/06/20  1346 12/10/20 1542  WBC 5.3 7.3  RBC 4.90 5.12  HGB 16.3 16.9  HCT 49.3 51.0  MCV 100.6* 99.6  MCH  --  33.0  MCHC 33.1 33.1  RDW 14.4 13.1  PLT 155.0 177   Thyroid No results for input(s): TSH, FREET4 in the last 168 hours. BNPNo results for input(s): BNP, PROBNP in the last 168 hours.  DDimer No results for input(s): DDIMER in the last 168 hours.   Radiology/Studies:  CT ANGIO HEAD NECK W WO CM  Result Date: 12/10/2020 CLINICAL DATA:  Neuro deficit, acute, stroke suspected EXAM: CT ANGIOGRAPHY HEAD AND NECK TECHNIQUE: Multidetector CT imaging of the head and neck was performed using the standard protocol during bolus administration of intravenous contrast. Multiplanar CT image reconstructions and MIPs were obtained to evaluate the vascular anatomy. Carotid stenosis measurements (when applicable) are obtained utilizing NASCET criteria, using the distal internal carotid diameter as the denominator. CONTRAST:  76mL OMNIPAQUE IOHEXOL 300 MG/ML  SOLN COMPARISON:  None. FINDINGS: CTA NECK FINDINGS Aortic arch: Great vessel origins are patent. Right carotid system: Atherosclerosis at the carotid bifurcation without greater than 50% stenosis. Left carotid system: Noncalcific atherosclerosis of the mid common carotid artery with approximately 50% stenosis. Mixed calcific and noncalcific atherosclerosis of the carotid bifurcation without greater than 50% stenosis relative to the distal vessel. Vertebral arteries: Left dominant. Severe stenosis of the right vertebral artery origin.A small portion of the proximal left vertebral artery is obscured by adjacent dense venous contrast. Skeleton: Mild multilevel degenerative disease. Osteopenia.  Other neck: No acute findings. Upper chest: Visualized lung apices are clear. Review of the MIP images confirms the above findings CTA HEAD FINDINGS Anterior circulation: Atherosclerosis of bilateral intracranial ICAs without significant stenosis. Bilateral MCAs and ACAs are patent without proximal hemodynamically significant stenosis. Mild narrowing of the left M1 MCA. Small (1 mm) outpouching arising from the supraclinoid right ICA (series 11, image 106), compatible with aneurysm or infundibulum. Posterior circulation: Left dominant intradural vertebral artery. Bilateral intradural vertebral arteries, basilar artery, and posterior cerebral arteries are patent without proximal hemodynamically significant stenosis. Otherwise, multifocal mild stenosis of bilateral vertebral arteries. Venous sinuses: As permitted by contrast timing, patent. Review of the MIP images confirms the above findings IMPRESSION: CTA head: 1. No large vessel occlusion or proximal hemodynamically significant stenosis. 2. Small (1 mm) outpouching arising from the supraclinoid right ICAz, compatible with aneurysm or infundibulum. CTA neck: 1. 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 2. Approximately 50% stenosis of the mid common carotid artery. Electronically Signed   By: Margaretha Sheffield M.D.   On: 12/10/2020 18:27   MR BRAIN WO CONTRAST  Result Date: 12/11/2020 CLINICAL DATA:  Vertigo, central diplopia EXAM: MRI HEAD WITHOUT CONTRAST TECHNIQUE: Multiplanar, multiecho pulse sequences of the brain and surrounding structures were obtained without intravenous contrast. COMPARISON:  04/20/2020 FINDINGS: Brain: No restricted diffusion to suggest acute or subacute infarct. Redemonstrated generalized cerebral atrophy. Scattered T2 hyperintense signal in the periventricular white matter, likely the sequela of chronic small vessel ischemic disease. No acute hemorrhage, hydrocephalus, extra-axial collection, mass, mass effect, or  midline shift. Vascular: Normal flow voids. Skull and upper cervical spine: Normal marrow signal. Sinuses/Orbits: Negative.  Status post bilateral lens replacements. Other: The mastoids are well aerated. IMPRESSION: No acute intracranial process. Electronically Signed   By: Merilyn Baba M.D.   On: 12/11/2020 00:46     Assessment and Plan:   Elevated troponin -Hs-troponin 251>>232>>216. -Patient had chest pain about a week ago  but nothing since then. -UnKnown etiology of elevated troponin -Recent cardiac catheterization showing nonobstructive CAD 6 weeks ago as above -Will get echocardiogram  2.  Permanent atrial fibrillation -Heart rate in 50s, intermittently dropping to 40s -Not on any rate control agent -Hold Xarelto in case needs cath based on echo  3.  Chronic dizziness/double vision -MRI brain without acute intra cranial process -CTA neck:   1. 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 2. Approximately 50% stenosis of the mid common carotid artery. - Will consult vascular - HR intermittently dropping 40s but not sustained   Risk Assessment/Risk Scores:   CHA2DS2-VASc Score = 2  This indicates a 2.2% annual risk of stroke. The patient's score is based upon: CHF History: 0 HTN History: 1 Diabetes History: 0 Stroke History: 0 Vascular Disease History: 0 Age Score: 1 Gender Score: 0   Severity of Illness: The appropriate patient status for this patient is OBSERVATION. Observation status is judged to be reasonable and necessary in order to provide the required intensity of service to ensure the patient's safety. The patient's presenting symptoms, physical exam findings, and initial radiographic and laboratory data in the context of their medical condition is felt to place them at decreased risk for further clinical deterioration. Furthermore, it is anticipated that the patient will be medically stable for discharge from the hospital within 2 midnights of  admission.    For questions or updates, please contact [place] Please consult [email or web address] for contact info under     Signed, Leanor Kail, PA  12/11/2020 12:42 PM    I have examined the patient and reviewed assessment and plan and discussed with patient.  Agree with above as stated.   I discussed the case with Dr. [name].  Given the recent cardiac cath about 6 weeks ago, it seems unlikely that his elevated troponin is a new acute coronary syndrome.  Will check echocardiogram.  If LV function is normal, would not pursue repeat cardiac cath.  If there is a wall motion abnormality or something indicating a recent myocardial injury, then would have to pursue cardiac cath.  Hold Xarelto.  Given his dizziness, watch on telemetry and see if he is having any slow heart rates.  He is not on any rate control medicines and has noted that his heart rate goes down into the 40s at times.  He has a vertebral artery stenosis.  We will check with vascular whether this could potentially be contributing to his symptoms.  Will need aggressive medical therapy including lipid-lowering therapy, Xarelto long-term for stroke prevention in the setting of atrial fibrillation.  Larae Grooms

## 2020-12-11 NOTE — ED Notes (Signed)
Cardiology paged to RN Santiago Glad per her request

## 2020-12-12 ENCOUNTER — Observation Stay (HOSPITAL_BASED_OUTPATIENT_CLINIC_OR_DEPARTMENT_OTHER): Payer: Medicare Other

## 2020-12-12 ENCOUNTER — Other Ambulatory Visit (HOSPITAL_COMMUNITY): Payer: Self-pay

## 2020-12-12 DIAGNOSIS — I708 Atherosclerosis of other arteries: Secondary | ICD-10-CM

## 2020-12-12 DIAGNOSIS — R079 Chest pain, unspecified: Secondary | ICD-10-CM | POA: Diagnosis not present

## 2020-12-12 DIAGNOSIS — R7989 Other specified abnormal findings of blood chemistry: Secondary | ICD-10-CM

## 2020-12-12 DIAGNOSIS — R778 Other specified abnormalities of plasma proteins: Secondary | ICD-10-CM

## 2020-12-12 DIAGNOSIS — I10 Essential (primary) hypertension: Secondary | ICD-10-CM | POA: Diagnosis not present

## 2020-12-12 DIAGNOSIS — I214 Non-ST elevation (NSTEMI) myocardial infarction: Secondary | ICD-10-CM | POA: Diagnosis not present

## 2020-12-12 DIAGNOSIS — E782 Mixed hyperlipidemia: Secondary | ICD-10-CM

## 2020-12-12 DIAGNOSIS — R42 Dizziness and giddiness: Secondary | ICD-10-CM

## 2020-12-12 DIAGNOSIS — E785 Hyperlipidemia, unspecified: Secondary | ICD-10-CM

## 2020-12-12 DIAGNOSIS — E118 Type 2 diabetes mellitus with unspecified complications: Secondary | ICD-10-CM

## 2020-12-12 DIAGNOSIS — I5032 Chronic diastolic (congestive) heart failure: Secondary | ICD-10-CM | POA: Diagnosis not present

## 2020-12-12 DIAGNOSIS — I4821 Permanent atrial fibrillation: Secondary | ICD-10-CM

## 2020-12-12 DIAGNOSIS — I2510 Atherosclerotic heart disease of native coronary artery without angina pectoris: Secondary | ICD-10-CM

## 2020-12-12 DIAGNOSIS — I25118 Atherosclerotic heart disease of native coronary artery with other forms of angina pectoris: Secondary | ICD-10-CM | POA: Diagnosis not present

## 2020-12-12 DIAGNOSIS — Z20822 Contact with and (suspected) exposure to covid-19: Secondary | ICD-10-CM | POA: Diagnosis not present

## 2020-12-12 LAB — BASIC METABOLIC PANEL
Anion gap: 7 (ref 5–15)
BUN: 23 mg/dL (ref 8–23)
CO2: 27 mmol/L (ref 22–32)
Calcium: 9.2 mg/dL (ref 8.9–10.3)
Chloride: 103 mmol/L (ref 98–111)
Creatinine, Ser: 1.36 mg/dL — ABNORMAL HIGH (ref 0.61–1.24)
GFR, Estimated: 55 mL/min — ABNORMAL LOW (ref >60)
Glucose, Bld: 111 mg/dL — ABNORMAL HIGH (ref 70–99)
Potassium: 5 mmol/L (ref 3.5–5.1)
Sodium: 137 mmol/L (ref 135–145)

## 2020-12-12 LAB — ECHOCARDIOGRAM COMPLETE
AR max vel: 0.96 cm2
AV Area VTI: 1.05 cm2
AV Area mean vel: 0.93 cm2
AV Mean grad: 9.5 mmHg
AV Peak grad: 18.9 mmHg
Ao pk vel: 2.18 m/s
Height: 75 in
S' Lateral: 4.4 cm
Weight: 4609.6 oz

## 2020-12-12 LAB — GLUCOSE, CAPILLARY: Glucose-Capillary: 107 mg/dL — ABNORMAL HIGH (ref 70–99)

## 2020-12-12 MED ORDER — ROSUVASTATIN CALCIUM 20 MG PO TABS
40.0000 mg | ORAL_TABLET | Freq: Every day | ORAL | Status: DC
  Administered 2020-12-12: 40 mg via ORAL
  Filled 2020-12-12: qty 2

## 2020-12-12 MED ORDER — FUROSEMIDE 20 MG PO TABS: 20.0000 mg | ORAL_TABLET | Freq: Every day | ORAL | 2 refills | Status: DC | PRN

## 2020-12-12 MED ORDER — HEPARIN (PORCINE) 25000 UT/250ML-% IV SOLN
1350.0000 [IU]/h | INTRAVENOUS | Status: DC
  Administered 2020-12-12: 1350 [IU]/h via INTRAVENOUS

## 2020-12-12 MED ORDER — ROSUVASTATIN CALCIUM 40 MG PO TABS: 40.0000 mg | ORAL_TABLET | Freq: Every day | ORAL | 5 refills | Status: DC

## 2020-12-12 NOTE — Consult Note (Signed)
[facility] [place]  ASSESSMENT / PLAN: 70 y.o. male with dizziness and CT angiogram evidence of right vertebral artery origin stenosis.  His left vertebral artery is dominant and widely patent.  The anterior and posterior circulations appear patent to the circle of Willis. I am not confident a right vertebral revascularization would alleviate his symptoms.  I would recommend further work-up for other causes of dizziness.    CHIEF COMPLAINT: dizziness  HISTORY OF PRESENT ILLNESS: Michael Whitney is a 70 y.o. male admitted to the cardiology service for NSTEMI.  Patient presented to the [facility], ER late 12/10/2020 reporting dizziness.  Patient reports this is a chronic issue, has been steadily worsening over the past several years.  He was seen multiple physicians without any diagnosis.  Patient reports sensation of unsteadiness on his feet and feeling that he is listing forward.  He reports visual disturbances that he describes as double vision.  He denies drop attacks, perioral numbness, tinnitus, dysarthria, or dysphagia.  His symptoms are not exacerbated by any activity.  His symptoms are not changed by position changes.  Past Medical History:  Diagnosis Date   Atrial fibrillation ([place])    Cataract    Diabetes mellitus without complication ([place])    Hypertension    Seizures ([place])     Past Surgical History:  Procedure Laterality Date   CATARACT EXTRACTION     PVC ABLATION N/A 07/14/2017   Procedure: PVC ABLATION;  Surgeon: Evans Lance, MD;  Location: [place] [facility];  Service: Cardiovascular;  Laterality: N/A;   RIGHT/LEFT HEART CATH AND CORONARY ANGIOGRAPHY N/A 10/24/2020   Procedure: RIGHT/LEFT HEART CATH AND CORONARY ANGIOGRAPHY;  Surgeon: [name], Peter M, MD;  Location: [place] [facility];  Service: Cardiovascular;  Laterality: N/A;   TONSILLECTOMY     VARICOSE VEIN SURGERY      Family History  Problem Relation Age of Onset   CAD Mother        MI at  age 36    Social History   Socioeconomic History   Marital status: Widowed    Spouse name: Not on file   Number of children: 2   Years of education: Not on file   Highest education level: Not on file  Occupational History   Occupation: retired    Comment: Geophysicist/field seismologist  Tobacco Use   Smoking status: Never   Smokeless tobacco: Never  Vaping Use   Vaping Use: Never used  Substance and Sexual Activity   Alcohol use: Yes    Alcohol/week: 2.0 standard drinks    Types: 1 Shots of liquor, 1 Cans of beer per week    Comment: occasionally   Drug use: No   Sexual activity: Not on file  Other Topics Concern   Not on file  Social History Narrative   Not on file   Social Determinants of Health   Financial Resource Strain: Not on file  Food Insecurity: Not on file  Transportation Needs: Not on file  Physical Activity: Not on file  Stress: Not on file  Social Connections: Not on file  Intimate Partner Violence: Not on file    No Active Allergies  Current Facility-Administered Medications  Medication Dose Route Frequency Provider Last Rate Last Admin   acetaminophen (TYLENOL) tablet 650 mg  650 mg Oral Q4H PRN Bhagat, Bhavinkumar, PA       heparin ADULT infusion 100 units/mL (25000 units/249mL)  1,350 Units/hr Intravenous Continuous Kris Mouton, [facility]  losartan (COZAAR) tablet 50 mg  50 mg Oral Daily [place], [place], PA   50 mg at 12/12/20 7412   multivitamin with minerals tablet 1 tablet  1 tablet Oral Daily Bhagat, Bhavinkumar, PA   1 tablet at 12/12/20 0853   nitroGLYCERIN (NITROSTAT) SL tablet 0.4 mg  0.4 mg Sublingual Q5 Min x 3 PRN Bhagat, Bhavinkumar, PA       omega-3 acid ethyl esters (LOVAZA) capsule 1 g  1 g Oral Daily Bhagat, Bhavinkumar, PA   1 g at 12/12/20 0853   ondansetron (ZOFRAN) injection 4 mg  4 mg Intravenous Q6H PRN Bhagat, Bhavinkumar, PA       vitamin B-12 (CYANOCOBALAMIN) tablet 1,000 mcg  1,000 mcg Oral Daily Dahlgren, Bhavinkumar, PA   1,000 mcg  at 12/12/20 8786    REVIEW OF SYSTEMS:  [X]  denotes positive finding, [ ]  denotes negative finding Cardiac  Comments:  Chest pain or chest pressure:    Shortness of breath upon exertion:    Short of breath when lying flat:    Irregular heart rhythm:        Vascular    Pain in calf, thigh, or hip brought on by ambulation:    Pain in feet at night that wakes you up from your sleep:     Blood clot in your veins:    Leg swelling:         Pulmonary    Oxygen at home:    Productive cough:     Wheezing:         Neurologic    Sudden weakness in arms or legs:     Sudden numbness in arms or legs:     Sudden onset of difficulty speaking or slurred speech:    Temporary loss of vision in one eye:     Problems with dizziness:         Gastrointestinal    Blood in stool:     Vomited blood:         Genitourinary    Burning when urinating:     Blood in urine:        Psychiatric    Major depression:         Hematologic    Bleeding problems:    Problems with blood clotting too easily:        Skin    Rashes or ulcers:        Constitutional    Fever or chills:      PHYSICAL EXAM Vitals:   12/11/20 2033 12/12/20 0438 12/12/20 0611 12/12/20 0728  BP: (!) 162/71 129/79  (!) 143/80  Pulse: 65 65  81  Resp: 18   19  Temp: 97.8 F (36.6 C) (!) 97.5 F (36.4 C)  97.8 F (36.6 C)  TempSrc: Oral Oral  Oral  SpO2: 95% 97%  96%  Weight: 131.8 kg  130.7 kg   Height: 6\' 3"  (1.905 m)       Constitutional: well appearing. no distress. Appears well nourished.  Neurologic: CN intact. no focal findings. no sensory loss. Psychiatric:  Mood and affect symmetric and appropriate. Eyes:  No icterus. No conjunctival pallor. Ears, nose, throat: mucous membranes moist. Midline trachea.  Cardiac: regular rate and rhythm.  Respiratory:  unlabored. Abdominal:  soft, non-tender, non-distended.  Peripheral vascular: 2+ radial pulses Extremity: no edema. no cyanosis. no pallor.  Skin: no  gangrene. no ulceration.  Lymphatic: no Stemmer's sign. no palpable lymphadenopathy.  PERTINENT LABORATORY AND RADIOLOGIC DATA  Most recent CBC CBC Latest Ref Rng & Units 12/11/2020 12/10/2020 12/06/2020  WBC 4.0 - 10.5 K/uL 5.7 7.3 5.3  Hemoglobin 13.0 - 17.0 g/dL 16.6 16.9 16.3  Hematocrit 39.0 - 52.0 % 49.2 51.0 49.3  Platelets 150 - 400 K/uL 159 177 155.0     Most recent CMP CMP Latest Ref Rng & Units 12/12/2020 12/11/2020 12/10/2020  Glucose 70 - 99 mg/dL 111(H) - 107(H)  BUN 8 - 23 mg/dL 23 - 23  Creatinine 0.61 - 1.24 mg/dL 1.36(H) 1.19 1.20  Sodium 135 - 145 mmol/L 137 - 138  Potassium 3.5 - 5.1 mmol/L 5.0 - 4.5  Chloride 98 - 111 mmol/L 103 - 101  CO2 22 - 32 mmol/L 27 - 27  Calcium 8.9 - 10.3 mg/dL 9.2 - 10.1  Total Protein 6.0 - 8.3 g/dL - - -  Total Bilirubin 0.2 - 1.2 mg/dL - - -  Alkaline Phos 39 - 117 U/L - - -  AST 0 - 37 U/L - - -  ALT 0 - 53 U/L - - -    Renal function Estimated Creatinine Clearance: 71.5 mL/min (A) (by C-G formula based on SCr of 1.36 mg/dL (H)).  Hgb A1c MFr Bld (%)  Date Value  11/29/2020 6.7 (H)    LDL Cholesterol  Date Value Ref Range Status  11/29/2020 153 (H) 0 - 99 mg/dL Final     Vascular Imaging: CT angiogram personally reviewed.  The right vertebral artery has significant stenosis at its origin.  The left vertebral artery is widely patent and dominant.  Both arteries coursed to a basilar artery which supplies the posterior circulation of the brain.  There is no hemodynamically significant stenosis other than the right vertebral artery origin stenosis.  Yevonne Aline. Stanford Breed, MD [facility] Phone Number: [phone] 12/12/2020 10:46 AM  Total time spent on preparing this encounter including chart review, data review, collecting history, examining the patient, coordinating care for this new patient, 60 minutes.  Portions of this report may have been transcribed using voice recognition  software.  Every effort has been made to ensure accuracy; however, inadvertent computerized transcription errors may still be present.

## 2020-12-12 NOTE — Plan of Care (Signed)

## 2020-12-12 NOTE — Progress Notes (Signed)
  Echocardiogram 2D Echocardiogram has been performed.  Merrie Roof F 12/12/2020, 2:52 PM

## 2020-12-12 NOTE — Progress Notes (Signed)
ANTICOAGULATION CONSULT NOTE - Initial Consult  Pharmacy Consult for heparin  Indication: atrial fibrillation  No Active Allergies  Patient Measurements: Height: 6\' 3"  (190.5 cm) Weight: 130.7 kg (288 lb 1.6 oz) IBW/kg (Calculated) : 84.5 HEPARIN DW (KG): 113.5   Vital Signs: Temp: 97.8 F (36.6 C) (10/20 0728) Temp Source: Oral (10/20 0728) BP: 143/80 (10/20 0728) Pulse Rate: 81 (10/20 0728)  Labs: Recent Labs    12/10/20 1542 12/10/20 2329 12/11/20 0151 12/11/20 0402 12/11/20 1610 12/12/20 0139  HGB 16.9  --   --   --  16.6  --   HCT 51.0  --   --   --  49.2  --   PLT 177  --   --   --  159  --   CREATININE 1.20  --   --   --  1.19 1.36*  TROPONINIHS  --  251* 232* 216*  --   --     Estimated Creatinine Clearance: 71.5 mL/min (A) (by C-G formula based on SCr of 1.36 mg/dL (H)).   Medical History: Past Medical History:  Diagnosis Date   Atrial fibrillation ([place])    Cataract    Diabetes mellitus without complication ([place])    Hypertension    Seizures ([place])     Medications:  Medications Prior to Admission  Medication Sig Dispense Refill Last Dose   Accu-Chek FastClix Lancets MISC Use 1 lancet daily to test blood sugar (E11.9)      acetaminophen (TYLENOL) 500 MG tablet Take 1,000 mg by mouth every 6 (six) hours as needed for moderate pain or headache.   Past Month   Artificial Tear Ointment (DRY EYES OP) Place 1 drop into both eyes daily.   12/10/2020   azelastine (ASTELIN) 0.1 % nasal spray Place 1 spray into both nostrils 2 (two) times daily as needed for rhinitis. Use in each nostril as directed 30 mL 12 Past Month   Cholecalciferol (VITAMIN D-3) 25 MCG (1000 UT) CAPS Take 1,000 Units by mouth daily.   12/10/2020   losartan (COZAAR) 100 MG tablet Take 50 mg by mouth daily. 90 tablet 3 12/10/2020   Multiple Vitamin (MULTI-VITAMIN) tablet Take 1 tablet by mouth daily.   12/10/2020   Omega-3 Fatty Acids (FISH OIL) 1000 MG CAPS Take 1,000 mg by mouth daily.    12/10/2020   OVER THE COUNTER MEDICATION Take 6 drops by mouth daily. Vitamins A, D, and K   12/10/2020   triamcinolone cream (KENALOG) 0.1 % Apply 1 application topically 3 (three) times a week.   Past Week   vitamin B-12 (CYANOCOBALAMIN) 1000 MCG tablet Take 1,000 mcg by mouth daily.   12/10/2020   Vitamin D, Ergocalciferol, (DRISDOL) 1.25 MG (50000 UNIT) CAPS capsule Take 50,000 Units by mouth every Tuesday.   12/10/2020   XARELTO 20 MG TABS tablet TAKE 1 TABLET(20 MG) BY MOUTH DAILY WITH SUPPER (Patient taking differently: Take 20 mg by mouth daily.) 90 tablet 1 12/10/2020 at 0900   COVID-19 mRNA bivalent vaccine, Pfizer, injection Inject into the muscle. 0.3 mL 0    furosemide (LASIX) 20 MG tablet Take 1 tablet (20 mg total) by mouth daily. (Patient not taking: Reported on 12/10/2020) 30 tablet 2 Not Taking   Scheduled:   losartan  50 mg Oral Daily   multivitamin with minerals  1 tablet Oral Daily   omega-3 acid ethyl esters  1 g Oral Daily   vitamin B-12  1,000 mcg Oral Daily    Assessment: 70  yo male with dizziness and elevated troponin. She is on xarelto PTA for afib (last dose 12/10/20 ~ 9am) and plans are to hold in cath of cath. Pharmacy consulted to dose heparin.  -hg= 16.6, SCr= 1.36    Goal of Therapy:  Heparin level 0.3-0.7 units/ml aPTT 66-102 seconds Monitor platelets by anticoagulation protocol: Yes   Plan:  -start heparin at 1350 units/hr -aPTT and heparin level in 8 hrs -Daily heparin level, aPTT and CBC  Hildred Laser, PharmD Clinical Pharmacist **Pharmacist phone directory can now be found on [email or web address] ([email or web address]).  Listed under [place].

## 2020-12-12 NOTE — TOC Benefit Eligibility Note (Signed)
Patient Teacher, English as a foreign language completed.    The patient is currently admitted and upon discharge could be taking Jardiance 10 mg.  The current 30 day co-pay is, $152.87.   The patient is currently admitted and upon discharge could be taking Farxiga 10 mg.  The current 30 day co-pay is, $146.78.   The patient is insured through [place], [place] Patient Advocate Specialist [place] [facility] Direct Number: [phone]  Fax: [phone]

## 2020-12-12 NOTE — Progress Notes (Addendum)
Progress Note  Patient Name: Michael Whitney Date of Encounter: 12/12/2020  [facility] Cardiologist: Peter [name], MD   Subjective   No acute overnight events. Patient continues to have persistent dizziness unrelated to position changes. He also reports some chronic shortness of breath over the last 3 months. He noted some chest pressure earlier this week but thinks it may have been related to lifting something heavy. No chest pain/discomfort now.  Inpatient Medications    Scheduled Meds:  losartan  50 mg Oral Daily   multivitamin with minerals  1 tablet Oral Daily   omega-3 acid ethyl esters  1 g Oral Daily   vitamin B-12  1,000 mcg Oral Daily   Continuous Infusions:  heparin     PRN Meds: acetaminophen, nitroGLYCERIN, ondansetron (ZOFRAN) IV   Vital Signs    Vitals:   12/11/20 2033 12/12/20 0438 12/12/20 0611 12/12/20 0728  BP: (!) 162/71 129/79  (!) 143/80  Pulse: 65 65  81  Resp: 18   19  Temp: 97.8 F (36.6 C) (!) 97.5 F (36.4 C)  97.8 F (36.6 C)  TempSrc: Oral Oral  Oral  SpO2: 95% 97%  96%  Weight: 131.8 kg  130.7 kg   Height: 6\' 3"  (1.905 m)       Intake/Output Summary (Last 24 hours) at 12/12/2020 1043 Last data filed at 12/12/2020 0859 Gross per 24 hour  Intake 240 ml  Output 500 ml  Net -260 ml   Last 3 Weights 12/12/2020 12/11/2020 12/11/2020  Weight (lbs) 288 lb 1.6 oz 290 lb 8 oz 295 lb  Weight (kg) 130.681 kg 131.77 kg 133.811 kg      Telemetry    Atrial fibrillation with rates in the 40s to 60s. - Personally Reviewed  ECG    Atrial fibrillation, rate 62 bpm, with ventricular couplet, RBBB, and left axis deviation. No acute ST/T changes. - Personally Reviewed  Physical Exam   GEN: No acute distress.   Neck: No JVD. Cardiac: Bradycardic with irregularly irregular rhythm. No murmurs, rubs, or gallops.  Respiratory: Clear to auscultation bilaterally. No wheezes, rhonchi, or rales. GI: Soft, non-tender, non-distended. MS: No  lower extremity edema. No deformity. Skin: Warm and dry. Neuro:  No focal deficits. Psych: Normal affect. Responds appropriately.  Labs    High Sensitivity Troponin:   Recent Labs  Lab 12/10/20 2329 12/11/20 0151 12/11/20 0402  TROPONINIHS 251* 232* 216*     Chemistry Recent Labs  Lab 12/06/20 1346 12/10/20 1542 12/11/20 1610 12/12/20 0139  NA 137 138  --  137  K 5.1 4.5  --  5.0  CL 102 101  --  103  CO2 28 27  --  27  GLUCOSE 78 107*  --  111*  BUN 26* 23  --  23  CREATININE 1.18 1.20 1.19 1.36*  CALCIUM 9.5 10.1  --  9.2  PROT 6.8  --   --   --   ALBUMIN 4.6  --   --   --   AST 33  --   --   --   ALT 28  --   --   --   ALKPHOS 40  --   --   --   BILITOT 0.6  --   --   --   GFRNONAA  --  >60 >60 55*  ANIONGAP  --  10  --  7    Lipids No results for input(s): CHOL, TRIG, HDL, LABVLDL, LDLCALC, CHOLHDL in  the last 168 hours.  Hematology Recent Labs  Lab 12/06/20 1346 12/10/20 1542 12/11/20 1610  WBC 5.3 7.3 5.7  RBC 4.90 5.12 5.01  HGB 16.3 16.9 16.6  HCT 49.3 51.0 49.2  MCV 100.6* 99.6 98.2  MCH  --  33.0 33.1  MCHC 33.1 33.1 33.7  RDW 14.4 13.1 13.2  PLT 155.0 177 159   Thyroid No results for input(s): TSH, FREET4 in the last 168 hours.  BNPNo results for input(s): BNP, PROBNP in the last 168 hours.  DDimer No results for input(s): DDIMER in the last 168 hours.   Radiology    CT ANGIO HEAD NECK W WO CM  Result Date: 12/10/2020 CLINICAL DATA:  Neuro deficit, acute, stroke suspected EXAM: CT ANGIOGRAPHY HEAD AND NECK TECHNIQUE: Multidetector CT imaging of the head and neck was performed using the standard protocol during bolus administration of intravenous contrast. Multiplanar CT image reconstructions and MIPs were obtained to evaluate the vascular anatomy. Carotid stenosis measurements (when applicable) are obtained utilizing NASCET criteria, using the distal internal carotid diameter as the denominator. CONTRAST:  3mL OMNIPAQUE IOHEXOL 300 MG/ML   SOLN COMPARISON:  None. FINDINGS: CTA NECK FINDINGS Aortic arch: Great vessel origins are patent. Right carotid system: Atherosclerosis at the carotid bifurcation without greater than 50% stenosis. Left carotid system: Noncalcific atherosclerosis of the mid common carotid artery with approximately 50% stenosis. Mixed calcific and noncalcific atherosclerosis of the carotid bifurcation without greater than 50% stenosis relative to the distal vessel. Vertebral arteries: Left dominant. Severe stenosis of the right vertebral artery origin.A small portion of the proximal left vertebral artery is obscured by adjacent dense venous contrast. Skeleton: Mild multilevel degenerative disease. Osteopenia. Other neck: No acute findings. Upper chest: Visualized lung apices are clear. Review of the MIP images confirms the above findings CTA HEAD FINDINGS Anterior circulation: Atherosclerosis of bilateral intracranial ICAs without significant stenosis. Bilateral MCAs and ACAs are patent without proximal hemodynamically significant stenosis. Mild narrowing of the left M1 MCA. Small (1 mm) outpouching arising from the supraclinoid right ICA (series 11, image 106), compatible with aneurysm or infundibulum. Posterior circulation: Left dominant intradural vertebral artery. Bilateral intradural vertebral arteries, basilar artery, and posterior cerebral arteries are patent without proximal hemodynamically significant stenosis. Otherwise, multifocal mild stenosis of bilateral vertebral arteries. Venous sinuses: As permitted by contrast timing, patent. Review of the MIP images confirms the above findings IMPRESSION: CTA head: 1. No large vessel occlusion or proximal hemodynamically significant stenosis. 2. Small (1 mm) outpouching arising from the supraclinoid right ICAz, compatible with aneurysm or infundibulum. CTA neck: 1. 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 2. Approximately 50% stenosis of the mid common carotid artery.  Electronically Signed   By: Margaretha Sheffield M.D.   On: 12/10/2020 18:27   MR BRAIN WO CONTRAST  Result Date: 12/11/2020 CLINICAL DATA:  Vertigo, central diplopia EXAM: MRI HEAD WITHOUT CONTRAST TECHNIQUE: Multiplanar, multiecho pulse sequences of the brain and surrounding structures were obtained without intravenous contrast. COMPARISON:  04/20/2020 FINDINGS: Brain: No restricted diffusion to suggest acute or subacute infarct. Redemonstrated generalized cerebral atrophy. Scattered T2 hyperintense signal in the periventricular white matter, likely the sequela of chronic small vessel ischemic disease. No acute hemorrhage, hydrocephalus, extra-axial collection, mass, mass effect, or midline shift. Vascular: Normal flow voids. Skull and upper cervical spine: Normal marrow signal. Sinuses/Orbits: Negative.  Status post bilateral lens replacements. Other: The mastoids are well aerated. IMPRESSION: No acute intracranial process. Electronically Signed   By: Francetta Found.D.  On: 12/11/2020 00:46    Cardiac Studies   Right/Left Cardiac Catheterization 10/24/2020:   Ost LM to Mid LM lesion is 30% stenosed.   Prox LAD lesion is 30% stenosed.   Mid Cx lesion is 45% stenosed.   Prox RCA to Mid RCA lesion is 30% stenosed.   The left ventricular systolic function is normal.   LV end diastolic pressure is mildly elevated.   The left ventricular ejection fraction is 55-65% by visual estimate.   Hemodynamic findings consistent with mild pulmonary hypertension.   There is no aortic valve stenosis.   There is trivial (1+) mitral regurgitation.   Nonobstructive CAD Minimally elevated left heart filling pressures.  Mild pulmonary HTN. Normal cardiac output.   Plan: patient is on appropriate diuretic therapy (considering he was hydrated prior to cath). Would consider evaluating possible pulmonary cause of his dyspnea. He does have some component of diastolic CHF. May consider PYP scan to evaluate for possible  amyloid.  Diagnostic Dominance: Right    Patient Profile     70 y.o. 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   Assessment & Plan    Elevated Troponin Non-Obstructive CAD - Recent cath in 10/2020 showed mild non-obstructive CAD. - High-sensitivity troponin 251 >> 232 >> 216. - EKG shows no acute ischemic changes. - Echo pending. If EF is down or he has any new wall motion abnormalities, will need cardiac catheterization. - No aspirin due to need for DOAC and  beta-blocker due to baseline bradycardia but will add high-intensity statin.   Permanent Atrial Fibrillation - Rate in the 40s to 60s. - No AV nodal agents due to baseline bradycardia. - Home Xarelto on hold in case cardiac catheterization is needed.  Hypertension - BP mildly elevated at times this admission. - Continue Losartan 50mg  daily (on 100mg  daily at home).  Hyperlipidemia - Lipid panel this admission: Total Cholesterol 226, Triglycerides 156, HDL 41, LDL 153.  - LDL goal <70 given CAD and vertebral artery stenosis.  - Will start Crestor 40mg  daily. - Will need repeat lipid panel and LFTs in 6-8 weeks.  Type 2 Diabetes Mellitus  - Hemoglobin A1c 6.7 on 11/29/2020.  - Not on any medications at home.  - Will need to follow-up with PCP.  Chronic Dizziness - Patient has chronic dizziness but reports it has been worse lately. Also reported double vision. - Brain MRI showed no acute findings. - Head/neck CTA showed severe stenosis of the right vertebral artery. Have consulted Vascular Surgery to see if this could be the cause of his  dizziness.  Hypothyroidism Hashimoto Thyroiditis - Patient has a history of hypothyroidism/Hashimoto; however, is not on any medications. He states he was previously on Methazole but broke out into a rash with this. - TSH normal at 1.55 and free T4 only slightly low at 0.55 on 11/29/2020. - He is wondering if his hypothyroidism could be causing his chronic dizziness. However, given free T4 is only slightly low and free TSH is normal, I don't think this is the cause.  - Recommend follow-up with PCP.   For questions or updates, please contact [place] Please consult [email or web address] for contact info under        Signed, [place]  Sarajane Jews, PA-C  12/12/2020, 10:43 AM    I have examined the patient and reviewed assessment and plan and discussed with patient.  Agree with above as stated.    Unclear cause of troponin elevation.  Has been of of Xarelto for48 hours.  Start IV heparin.  If he needs cardiac or PV cath, will need Xarelto held.  Await echo  and vascular eval.  Larae Grooms

## 2020-12-12 NOTE — Discharge Summary (Addendum)
Discharge Summary    Patient ID: Michael Whitney MRN: [id number]; DOB: June 12, 1950  Admit date: 12/10/2020 Discharge date: 12/12/2020  PCP:  Mackie Pai, [place] Prov[facility] Cardiologist:  Peter [name], MD  Electrophysiologist:  Cristopher Peru, MD  {  Discharge Diagnoses    Active Problems:   Chronic dizziness   Type 2 diabetes mellitus with complication, without long-term current use of insulin (HCC)   Chronic diastolic CHF (congestive heart failure) (HCC)   Elevated troponin   CAD (coronary artery disease)   Permanent atrial fibrillation ([place])   Hyperlipidemia    Diagnostic Studies/Procedures    Echocardiogram 12/12/2020:  1. Left ventricular ejection fraction, by estimation, is 50 to 55%. The  left ventricle has low normal function. The left ventricle has no regional  wall motion abnormalities. There is mild left ventricular hypertrophy.  Left ventricular diastolic function   could not be evaluated.   2. Right ventricular systolic function is mildly reduced. The right  ventricular size is normal.   3. Left atrial size was severely dilated.   4. Right atrial size was moderately dilated.   5. The mitral valve is normal in structure. No evidence of mitral valve  regurgitation. No evidence of mitral stenosis.   6. The aortic valve is tricuspid. There is severe calcifcation of the  aortic valve. Aortic valve regurgitation is trivial. The aortic valve is  moderately to severely calcified with markedly reduced cusp excursion. By  Doppler parameters stensosis is  likely moderate but visually appears more severe.   7. The inferior vena cava is dilated in size with >50% respiratory  variability, suggesting right atrial pressure of 8 mmHg.    History of Present Illness     Michael Whitney is a 70 y.o. male with a history of non-obstructive CAD on recent cardiac catheterization in 04/7900, chronic diastolic CHF, permanent atrial fibrillation on Xarelto, mild aortic  stenosis/insufficiency on Echo in 2021, chronic dizziness, hypertension, hyperlipidemia, type 2 diabetes mellitus, hypothyroidism/Hashimoto thyroiditis, and seizures who was admitted on 12/11/2020 for further evaluation of elevated troponin after presenting with dizziness.   Patient recently had right/left cardiac catheterization on 10/24/2020 which showed mild non-obstructive CAD, minimally elevated left heart filling pressures, mild pulmonary hypertension, and normal cardiac output. He was felt to have a component of diastolic CHF but was on appropriate diuretic therapy. There was mention of need to consider PYP scan to evaluate for possible amyloid.  Patient presented to the ED on 12/11/2020 for further evaluation of chronic dizziness which had recently worsened. He also reported blurred/double vision for the past 1 month. His symptoms got worse over the last few days which is what prompted him to come to the ED. He denied any loss of vision, headache, or trouble swallowing. He does have some balance issues though. He did reported an episode of substernal chest tightness that lasted for a few hours a few weeks prior to admission but no recurrent chest pain since then.   In the ED, EKG showed atrial fibrillation with no acute ischemic changes. High-sensitivity troponin 251 >> 232 >> 216. CBC was completely normal. Na 137, K 5.0, Glucose 111, BUN 23, Cr 1.36. 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 Brain MRI showed no acute  findings. Patient was started on IV Heparin and admitted.  Hospital Course     Consultants: Vascular Surgery  Elevated Troponin Non-Obstructive CAD Recent cath in 10/2020 showed mild  non-obstructive CAD. High-sensitivity troponin 251 >> 232 >> 216. EKG shows no acute ischemic changes. Echo showed LVEF of 50-55%.  No regional wall motion abnormality. No additional ischemic evaluation felt to be needed given recent reassuring cardiac catheterization. Unclear what caused troponin elevation. Started on high-intensity statin given CAD and vertebral artery stenosis. No Aspirin due to need for DOAC and no beta blocker due to baseline bradycardia.  Chronic Diastolic CHF Echo showed LV function of 50 to 55%.  Unable to evaluate diastolic dysfunction.  Mildly reduced RV function. Euvolemic during admission. Patient has Lasix 20mg  daily listed under PTA mediation but reportedly has not been taking this.  Recommended to take Lasix as needed.   Permanent Atrial Fibrillation Rates in the 40s to 60s. No AV nodal agents due to baseline bradycardia. Do not think his bradycardia is the cause of her dizziness. Home Xarelto was initially held and patient was started on IV Heparin in case Echo was abnormal and repeat cardiac catheterization was needed. Will restart at discharge.    Hypertension BP mildly elevated at times this admission. Continue home Losartan.   Hyperlipidemia Lipid panel this admission: Total Cholesterol 226, Triglycerides 156, HDL 41, LDL 153.  LDL goal <70 given CAD and vertebral artery stenosis. Will start Crestor 40mg  daily. Will need repeat lipid panel and LFTs in 6-8 weeks.   Type 2 Diabetes Mellitus  Hemoglobin A1c 6.7 on 11/29/2020. Not on any medications at home. Will need to follow-up with PCP.   Chronic Dizziness Patient has chronic dizziness but reports it has been worse lately. Also reported double vision. Brain MRI showed no acute findings. 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 Have consulted Vascular Surgery to see if this could be the cause of his dizziness. He was seen by Dr. Stanford Breed who did not think his symptoms were from his  vertebral artery stenosis. He reportedly has been seen by multiple different providers for this in the past. Per Dr. Stanford Breed, "if he sees Neurology as an outpatient and they feel his symptoms are most likely from vertebral artery stenosis, I can do a vertebral artery transposition for him, but I am no sure this will give him any kind of relief given wide patency of the left vertebral artery and basilar artery." Recommend patient continue to follow-up for PCP with this.   Hypothyroidism Hashimoto Thyroiditis Patient has a history of hypothyroidism/Hashimoto; however, is not on any medications. He states he was previously on Methimazole but broke out into a rash with this. TSH normal at 1.55 and free T4 only slightly low at 0.55 on 11/29/2020. He is wondering if his hypothyroidism could be causing his chronic dizziness. However given free T4 is only slightly low and free TSH is normal, I don't think this is the cause. Recommend follow-up with PCP.  Aortic stenosis Echocardiogram this admission showed severe calcifcation of the  aortic valve. Aortic valve regurgitation is trivial. The aortic valve is  moderately to severely calcified with markedly reduced cusp excursion. By Doppler parameters stensosis is  likely moderate but visually appears more severe.  There was no evidence of aortic stenosis on recent cardiac catheterization.  His echocardiogram films reviewed by Dr. Harrell Gave who said poor quality images.  Dr. [name] was present there as well.  No further work-up recommended.  Patient  seen and examined by Dr. Irish Lack and determined to be stable for discharge. Outpatient follow-up as been arranged. Medications as below.  Did the patient have an acute coronary syndrome (MI, NSTEMI, STEMI, etc) this admission?:  No.   The elevated Troponin was due to the acute medical illness (demand ischemia).   _____________  Discharge Vitals Blood pressure (!) 120/54, pulse 98, temperature 98.2 F (36.8 C),  temperature source Oral, resp. rate 18, height 6\' 3"  (1.905 m), weight 130.7 kg, SpO2 97 %.  Filed Weights   12/11/20 1504 12/11/20 2033 12/12/20 0611  Weight: 133.8 kg 131.8 kg 130.7 kg    Labs & Radiologic Studies    CBC Recent Labs    12/10/20 1542 12/11/20 1610  WBC 7.3 5.7  NEUTROABS 5.0  --   HGB 16.9 16.6  HCT 51.0 49.2  MCV 99.6 98.2  PLT 177 559   Basic Metabolic Panel Recent Labs    12/10/20 1542 12/11/20 1610 12/12/20 0139  NA 138  --  137  K 4.5  --  5.0  CL 101  --  103  CO2 27  --  27  GLUCOSE 107*  --  111*  BUN 23  --  23  CREATININE 1.20 1.19 1.36*  CALCIUM 10.1  --  9.2   Liver Function Tests No results for input(s): AST, ALT, ALKPHOS, BILITOT, PROT, ALBUMIN in the last 72 hours. No results for input(s): LIPASE, AMYLASE in the last 72 hours. High Sensitivity Troponin:   Recent Labs  Lab 12/10/20 2329 12/11/20 0151 12/11/20 0402  TROPONINIHS 251* 232* 216*    BNP Invalid input(s): POCBNP D-Dimer No results for input(s): DDIMER in the last 72 hours. Hemoglobin A1C No results for input(s): HGBA1C in the last 72 hours. Fasting Lipid Panel No results for input(s): CHOL, HDL, LDLCALC, TRIG, CHOLHDL, LDLDIRECT in the last 72 hours. Thyroid Function Tests No results for input(s): TSH, T4TOTAL, T3FREE, THYROIDAB in the last 72 hours.  Invalid input(s): FREET3 _____________  CT ANGIO HEAD NECK W WO CM  Result Date: 12/10/2020 CLINICAL DATA:  Neuro deficit, acute, stroke suspected EXAM: CT ANGIOGRAPHY HEAD AND NECK TECHNIQUE: Multidetector CT imaging of the head and neck was performed using the standard protocol during bolus administration of intravenous contrast. Multiplanar CT image reconstructions and MIPs were obtained to evaluate the vascular anatomy. Carotid stenosis measurements (when applicable) are obtained utilizing NASCET criteria, using the distal internal carotid diameter as the denominator. CONTRAST:  41mL OMNIPAQUE IOHEXOL 300 MG/ML   SOLN COMPARISON:  None. FINDINGS: CTA NECK FINDINGS Aortic arch: Great vessel origins are patent. Right carotid system: Atherosclerosis at the carotid bifurcation without greater than 50% stenosis. Left carotid system: Noncalcific atherosclerosis of the mid common carotid artery with approximately 50% stenosis. Mixed calcific and noncalcific atherosclerosis of the carotid bifurcation without greater than 50% stenosis relative to the distal vessel. Vertebral arteries: Left dominant. Severe stenosis of the right vertebral artery origin.A small portion of the proximal left vertebral artery is obscured by adjacent dense venous contrast. Skeleton: Mild multilevel degenerative disease. Osteopenia. Other neck: No acute findings. Upper chest: Visualized lung apices are clear. Review of the MIP images confirms the above findings CTA HEAD FINDINGS Anterior circulation: Atherosclerosis of bilateral intracranial ICAs without significant stenosis. Bilateral MCAs and ACAs are patent without proximal hemodynamically significant stenosis. Mild narrowing of the left M1 MCA. Small (1 mm) outpouching arising from the supraclinoid right ICA (series 11, image 106), compatible with aneurysm or infundibulum. Posterior circulation: Left dominant  intradural vertebral artery. Bilateral intradural vertebral arteries, basilar artery, and posterior cerebral arteries are patent without proximal hemodynamically significant stenosis. Otherwise, multifocal mild stenosis of bilateral vertebral arteries. Venous sinuses: As permitted by contrast timing, patent. Review of the MIP images confirms the above findings IMPRESSION: CTA head: 1. No large vessel occlusion or proximal hemodynamically significant stenosis. 2. Small (1 mm) outpouching arising from the supraclinoid right ICAz, compatible with aneurysm or infundibulum. CTA neck: 1. 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 2. Approximately 50% stenosis of the mid common carotid artery.  Electronically Signed   By: Margaretha Sheffield M.D.   On: 12/10/2020 18:27   CT HEAD WO CONTRAST (5MM)  Result Date: 12/06/2020 CLINICAL DATA:  Intermittent vertigo and dizziness EXAM: CT HEAD WITHOUT CONTRAST TECHNIQUE: Contiguous axial images were obtained from the base of the skull through the vertex without intravenous contrast. COMPARISON:  04/20/2020 FINDINGS: Brain: No evidence of acute infarction, hemorrhage, hydrocephalus, extra-axial collection or mass lesion/mass effect. Mild periventricular and deep white matter hypodensity. Vascular: No hyperdense vessel or unexpected calcification. Skull: Normal. Negative for fracture or focal lesion. Sinuses/Orbits: No acute finding. Other: None. IMPRESSION: No acute intracranial pathology. Small-vessel white matter disease. Electronically Signed   By: Delanna Ahmadi M.D.   On: 12/06/2020 15:02   MR BRAIN WO CONTRAST  Result Date: 12/11/2020 CLINICAL DATA:  Vertigo, central diplopia EXAM: MRI HEAD WITHOUT CONTRAST TECHNIQUE: Multiplanar, multiecho pulse sequences of the brain and surrounding structures were obtained without intravenous contrast. COMPARISON:  04/20/2020 FINDINGS: Brain: No restricted diffusion to suggest acute or subacute infarct. Redemonstrated generalized cerebral atrophy. Scattered T2 hyperintense signal in the periventricular white matter, likely the sequela of chronic small vessel ischemic disease. No acute hemorrhage, hydrocephalus, extra-axial collection, mass, mass effect, or midline shift. Vascular: Normal flow voids. Skull and upper cervical spine: Normal marrow signal. Sinuses/Orbits: Negative.  Status post bilateral lens replacements. Other: The mastoids are well aerated. IMPRESSION: No acute intracranial process. Electronically Signed   By: Merilyn Baba M.D.   On: 12/11/2020 00:46   ECHOCARDIOGRAM COMPLETE  Result Date: 12/12/2020    ECHOCARDIOGRAM REPORT   Patient Name:   Michael Whitney Date of Exam: 12/12/2020 Medical Rec #:   [id number]     Height:       75.0 in Accession #:    [id number]    Weight:       288.1 lb Date of Birth:  1950-07-03     BSA:          2.563 m Patient Age:    70 years      BP:           135/82 mmHg Patient Gender: M             HR:           66 bpm. Exam Location:  Inpatient Procedure: 2D Echo, Cardiac Doppler and Color Doppler Indications:    Chest Pain R07.9  History:        Patient has prior history of Echocardiogram examinations, most                 recent 08/29/2019. Arrythmias:Atrial Fibrillation; Risk                 Factors:Hypertension.  Sonographer:    Merrie Roof RDCS Referring Phys: [id number] [place]  1. Left ventricular ejection fraction, by estimation, is 50 to 55%. The left ventricle has low normal function. The left ventricle has no regional  wall motion abnormalities. There is mild left ventricular hypertrophy. Left ventricular diastolic function  could not be evaluated.  2. Right ventricular systolic function is mildly reduced. The right ventricular size is normal.  3. Left atrial size was severely dilated.  4. Right atrial size was moderately dilated.  5. The mitral valve is normal in structure. No evidence of mitral valve regurgitation. No evidence of mitral stenosis.  6. The aortic valve is tricuspid. There is severe calcifcation of the aortic valve. Aortic valve regurgitation is trivial. The aortic valve is moderately to severely calcified with markedly reduced cusp excursion. By Doppler parameters stensosis is likely moderate but visually appears more severe.  7. The inferior vena cava is dilated in size with >50% respiratory variability, suggesting right atrial pressure of 8 mmHg. FINDINGS  Left Ventricle: Left ventricular ejection fraction, by estimation, is 50 to 55%. The left ventricle has low normal function. The left ventricle has no regional wall motion abnormalities. The left ventricular internal cavity size was normal in size. There is mild left ventricular  hypertrophy. Left ventricular diastolic function could not be evaluated due to atrial fibrillation. Left ventricular diastolic function could not be evaluated. Right Ventricle: The right ventricular size is normal. No increase in right ventricular wall thickness. Right ventricular systolic function is mildly reduced. Left Atrium: Left atrial size was severely dilated. Right Atrium: Right atrial size was moderately dilated. Pericardium: There is no evidence of pericardial effusion. Mitral Valve: The mitral valve is normal in structure. No evidence of mitral valve regurgitation. No evidence of mitral valve stenosis. Tricuspid Valve: The tricuspid valve is normal in structure. Tricuspid valve regurgitation is trivial. No evidence of tricuspid stenosis. Aortic Valve: The aortic valve is tricuspid. There is severe calcifcation of the aortic valve. Aortic valve regurgitation is trivial. Moderate aortic stenosis is present. Aortic valve mean gradient measures 9.5 mmHg. Aortic valve peak gradient measures 18.9 mmHg. Aortic valve area, by VTI measures 1.05 cm. Pulmonic Valve: The pulmonic valve was normal in structure. Pulmonic valve regurgitation is not visualized. No evidence of pulmonic stenosis. Aorta: The aortic root is normal in size and structure. Venous: The inferior vena cava is dilated in size with greater than 50% respiratory variability, suggesting right atrial pressure of 8 mmHg. IAS/Shunts: The interatrial septum appears to be lipomatous. No atrial level shunt detected by color flow Doppler.  LEFT VENTRICLE PLAX 2D LVIDd:         5.80 cm LVIDs:         4.40 cm LV PW:         1.20 cm LV IVS:        1.30 cm LVOT diam:     2.00 cm LV SV:         43 LV SV Index:   17 LVOT Area:     3.14 cm  RIGHT VENTRICLE             IVC RV Basal diam:  4.30 cm     IVC diam: 2.20 cm RV Mid diam:    3.40 cm RV S prime:     11.10 cm/s TAPSE (M-mode): 1.6 cm LEFT ATRIUM             Index        RIGHT ATRIUM           Index LA diam:         6.40 cm 2.50 cm/m   RA Area:     28.50 cm LA Vol (A2C):  97.4 ml 38.01 ml/m  RA Volume:   97.90 ml  38.20 ml/m LA Vol (A4C):   89.6 ml 34.96 ml/m LA Biplane Vol: 95.7 ml 37.34 ml/m  AORTIC VALVE AV Area (Vmax):    0.96 cm AV Area (Vmean):   0.93 cm AV Area (VTI):     1.05 cm AV Vmax:           217.50 cm/s AV Vmean:          144.500 cm/s AV VTI:            0.414 m AV Peak Grad:      18.9 mmHg AV Mean Grad:      9.5 mmHg LVOT Vmax:         66.50 cm/s LVOT Vmean:        42.600 cm/s LVOT VTI:          0.138 m LVOT/AV VTI ratio: 0.33  AORTA Ao Root diam: 3.30 cm Ao Asc diam:  3.40 cm  SHUNTS Systemic VTI:  0.14 m Systemic Diam: 2.00 cm Glori Bickers MD Electronically signed by Glori Bickers MD Signature Date/Time: 12/12/2020/4:20:31 PM    Final    Disposition   Patient is being discharged home today in good condition.  Follow-up Plans & Appointments     Follow-up Information     Deberah Pelton, NP Follow up.   Specialty: Cardiology Why: Hospital follow-up with Cardiology scheduled for 12/30/2020 at 8:50am. Please arrive 15 minutes early for check-in. If this date/time does not work for you, please call our office to reschedule. Contact information: [address] [place] [address] [phone]                  Discharge Medications   Allergies as of 12/12/2020   No Active Allergies      Medication List     TAKE these medications    Accu-Chek FastClix Lancets Misc Use 1 lancet daily to test blood sugar (E11.9)   acetaminophen 500 MG tablet Commonly known as: TYLENOL Take 1,000 mg by mouth every 6 (six) hours as needed for moderate pain or headache.   azelastine 0.1 % nasal spray Commonly known as: ASTELIN Place 1 spray into both nostrils 2 (two) times daily as needed for rhinitis. Use in each nostril as directed   DRY EYES OP Place 1 drop into both eyes daily.   Fish Oil 1000 MG Caps Take 1,000 mg by mouth daily.   furosemide  20 MG tablet Commonly known as: LASIX Take 1 tablet (20 mg total) by mouth daily as needed for edema or fluid. What changed:  when to take this reasons to take this   losartan 100 MG tablet Commonly known as: COZAAR Take 50 mg by mouth daily.   Multi-Vitamin tablet Take 1 tablet by mouth daily.   OVER THE COUNTER MEDICATION Take 6 drops by mouth daily. Vitamins A, D, and K   Pfizer COVID-19 Vac Bivalent injection Generic drug: COVID-19 mRNA bivalent vaccine (Pfizer) Inject into the muscle.   rosuvastatin 40 MG tablet Commonly known as: CRESTOR Take 1 tablet (40 mg total) by mouth daily. Start taking on: December 13, 2020   triamcinolone cream 0.1 % Commonly known as: KENALOG Apply 1 application topically 3 (three) times a week.   vitamin B-12 1000 MCG tablet Commonly known as: CYANOCOBALAMIN Take 1,000 mcg by mouth daily.   Vitamin D (Ergocalciferol) 1.25 MG (50000 UNIT) Caps capsule Commonly known as: DRISDOL Take 50,000  Units by mouth every Tuesday.   Vitamin D-3 25 MCG (1000 UT) Caps Take 1,000 Units by mouth daily.   Xarelto 20 MG Tabs tablet Generic drug: rivaroxaban TAKE 1 TABLET(20 MG) BY MOUTH DAILY WITH SUPPER What changed: See the new instructions.           Outstanding Labs/Studies   None  Duration of Discharge Encounter   Greater than 30 minutes including physician time.  SignedCrista Luria [place], PA 12/12/2020, 4:59 PM   I have examined the patient and reviewed assessment and plan and discussed with patient.  Agree with above as stated.  Unclear reason for troponin elevation.  SOme concern for aortic stenosis on echo, but this was not seen at cath in 10/2020.  Restart Xarelto.  No plan for invasive testing right now.  Continue medical therapy.  He will follow-up with Dr. [name]. He also needs to get follow-up with his PCP for his thyroid.  Larae Grooms

## 2020-12-13 ENCOUNTER — Ambulatory Visit: Payer: Medicare Other | Admitting: Medical

## 2020-12-13 ENCOUNTER — Ambulatory Visit (HOSPITAL_BASED_OUTPATIENT_CLINIC_OR_DEPARTMENT_OTHER): Payer: Medicare Other

## 2020-12-17 ENCOUNTER — Telehealth: Payer: Self-pay | Admitting: Cardiology

## 2020-12-17 NOTE — Telephone Encounter (Signed)
Pt is stating that he feels the same way he did prior to going to the hospital after hes been discharged.. would like to speak w/ Dr. [name] in regards to what to do next.. please advise

## 2020-12-17 NOTE — Telephone Encounter (Signed)
Spoke to patient he stated he still feels bad.He does not feel any better since he was discharged from [facility] hospital 10/20.Stated he is weak.He has double vision when he turns his head.Stated double vision is not new.He has had for several months.No chest pain.No sob.He would like to see Dr.Jordan sooner than 11/7.Advised Dr.Jordan has had a cancellation 10/27.Appointment scheduled 10/27 at 3:30 pm.

## 2020-12-18 NOTE — Progress Notes (Deleted)
Office Visit    Patient Name: Michael Whitney Date of Encounter: 12/18/2020  PCP:  Sagu[place], [name], [place]  Cardiologist:  Mika Griffitts [name], MD  Advanced Practice Provider:  No care team member to display Electrophysiologist:  Cristopher Peru, MD     Chief Complaint    Michael Whitney is a 70 y.o. 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   Past Medical History    Past Medical History:  Diagnosis Date   Atrial fibrillation ([place])    Cataract    Diabetes mellitus without complication ([place])    Hypertension    Seizures ([place])    Past Surgical History:  Procedure Laterality Date   CATARACT EXTRACTION     PVC ABLATION N/A 07/14/2017   Procedure: PVC ABLATION;  Surgeon: Evans Lance, MD;  Location: [facility];  Service: Cardiovascular;  Laterality: N/A;   RIGHT/LEFT HEART CATH AND CORONARY ANGIOGRAPHY N/A 10/24/2020   Procedure: RIGHT/LEFT HEART CATH AND CORONARY ANGIOGRAPHY;  Surgeon: [name], Pius Byrom M, MD;  Location: [facility];  Service: Cardiovascular;  Laterality: N/A;   TONSILLECTOMY     VARICOSE VEIN SURGERY     Allergies  No Active Allergies  History of Present Illness    Michael Whitney is a 70 y.o. 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 He is seen for post hospital follow up.   Diagnosed with atrial fibrillation in 2019.  EP study by Dr. Lovena Le revealing PVCs arising near His bundle and ablation was not recommended due to potential for heart block.  He has been maintained on a rate control therapy with anticoagulation.  He has longstanding history of chronic  dizziness.  Has been unrelated to changes in blood pressure or heart rate.  Has been thought to be due to vertigo and has followed with neurology.  He has completed some element of physical therapy with minimal improvement.  ED visit at [facility] 10/01/2020.  He presented with feeling weak, dyspnea on exertion, chest pain, dizziness, lower extremity edema.  Troponin slightly elevated, BNP slightly elevated. TTE performed with bilateral atria severely dilated, LVEF 55 to 60%, RV moderately to severely dilated, RVSF moderately reduced, mild to moderate MR, moderate TR, mild to moderate pulmonary hypertension.  He was recommended for diuresis.  He was also started on Protonix 40 mg twice daily for 30 days and then decrease to once daily after 1 month at follow-up with PCP.  His hydrochlorothiazide was increased to 25 mg daily.  Imdur 30 mg initiated.  Discharged on 7 day course of Lasix. He was recommended for consideration of right heart cath as an outpatient.  Lab work 10/02/20 creatnine 1.08, GFR 73, Hb 16.7  Seen 10/16/20 noting dizziness which was persistent. He was concerned about weight gain and wondered if thyroid contributory. He endorsed pain as well as dyspnea on exertion was set up for right and left cardiac catheterization.  Underwent right and left cardiac catheterization 10/24/2020 showing nonobstructive coronary artery disease, minimally elevated left heart filling pressures, mild pulmonary hypertension, normal cardiac output.  He was on appropriate diuretic therapy and noted to have some component of diastolic heart failure.  Recommendation was to consider evaluating possible pulmonary  cause of dyspnea.  A PYP scan to evaluate for possible amyloid could be considered.  Patient presented to the ED on 12/11/2020 for further evaluation of chronic dizziness which had recently worsened. He also reported blurred/double vision for the past 1 month. His symptoms got worse over the last few  days which is what prompted him to come to the ED. He denied any loss of vision, headache, or trouble swallowing. He does have some balance issues though. He did reported an episode of substernal chest tightness that lasted for a few hours a few weeks prior to admission but no recurrent chest pain since then.    In the ED, EKG showed atrial fibrillation with no acute ischemic changes. High-sensitivity troponin 251 >> 232 >> 216. CBC was completely normal. Na 137, K 5.0, Glucose 111, BUN 23, Cr 1.36. 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 Brain MRI showed no acute findings. He was started on statin therapy. Seen by vascular surgery who felt his vertebral stensosis was not causing his dizziness. Follow up with Neuro recommendeded. Echo was unchanged.   EKGs/Labs/Other Studies Reviewed:   The following studies were reviewed today:  Echo 10/02/20 The left ventricular size is normal. There is severe concentric left  ventricular hypertrophy. The left ventricular wall motion is normal.  Left ventricular systolic function is normal. LV ejection fraction =  55-60%.  Left ventricular filling pattern is pseudonormal. LAP is  indeterminate.  The right ventricle is moderate to severely dilated. The right  ventricular systolic function is moderately reduced.  The atria are severely dilated.  There is mild to moderate mitral regurgitation.  There is moderate tricuspid regurgitation.  Mild to moderate pulmonary hypertension.  Estimated right ventricular systolic pressure is 46 mmHg.  The IVC is dilated with an abnormal collapsibility index, this  suggestive of increased right atrial pressure.   Progression of RV  changes and atrial dilation compared to 2020 study.    RIGHT/LEFT HEART CATH AND CORONARY ANGIOGRAPHY   Conclusion      Ost LM to Mid LM lesion is 30% stenosed.   Prox LAD lesion is 30% stenosed.   Mid Cx lesion is 45% stenosed.   Prox RCA to Mid RCA lesion is 30% stenosed.   The left ventricular systolic function is normal.   LV end diastolic pressure is mildly elevated.   The left ventricular ejection fraction is 55-65% by visual estimate.   Hemodynamic findings consistent with mild pulmonary hypertension.   There is no aortic valve stenosis.   There is trivial (1+) mitral regurgitation.   Nonobstructive CAD Minimally elevated left heart filling pressures.  Mild pulmonary HTN. Normal cardiac output.   Plan: patient is on appropriate diuretic therapy (considering he was hydrated prior to cath). Would consider evaluating possible pulmonary cause of his dyspnea. He does have some component of diastolic CHF. May consider PYP scan to evaluate for possible amyloid    Echo 12/12/20:IMPRESSIONS     1. Left ventricular ejection fraction, by estimation, is 50 to 55%. The  left ventricle has low normal function. The left ventricle has no regional  wall motion abnormalities. There is mild left ventricular hypertrophy.  Left ventricular diastolic function   could not be evaluated.   2. Right ventricular  systolic function is mildly reduced. The right  ventricular size is normal.   3. Left atrial size was severely dilated.   4. Right atrial size was moderately dilated.   5. The mitral valve is normal in structure. No evidence of mitral valve  regurgitation. No evidence of mitral stenosis.   6. The aortic valve is tricuspid. There is severe calcifcation of the  aortic valve. Aortic valve regurgitation is trivial. The aortic valve is  moderately to severely calcified with markedly reduced cusp excursion. By  Doppler parameters stensosis is  likely moderate but visually appears more  severe.   7. The inferior vena cava is dilated in size with >50% respiratory  variability, suggesting right atrial pressure of 8 mmHg.     EKG:  EKG is ordered today.  The ekg ordered today demonstrates rate controlled atrial fibrillation 63bpm with stable RBBB.   Recent Labs: 11/29/2020: TSH 1.55 12/06/2020: ALT 28 12/11/2020: Hemoglobin 16.6; Platelets 159 12/12/2020: BUN 23; Creatinine, Ser 1.36; Potassium 5.0; Sodium 137  Recent Lipid Panel    Component Value Date/Time   CHOL 226 (H) 11/29/2020 1034   TRIG 156.0 (H) 11/29/2020 1034   HDL 41.00 11/29/2020 1034   CHOLHDL 6 11/29/2020 1034   VLDL 31.2 11/29/2020 1034   LDLCALC 153 (H) 11/29/2020 1034    Home Medications   No outpatient medications have been marked as taking for the 12/19/20 encounter (Appointment) with [name], Khyan Oats M, MD.     Review of Systems    All other systems reviewed and are otherwise negative except as noted above.  Physical Exam    VS:  There were no vitals taken for this visit. , BMI There is no height or weight on file to calculate BMI.  Wt Readings from Last 3 Encounters:  12/12/20 288 lb 1.6 oz (130.7 kg)  12/06/20 295 lb 6.4 oz (134 kg)  11/29/20 295 lb (133.8 kg)     GEN: Well nourished, well developed, in no acute distress. HEENT: normal. Neck: Supple, no JVD, carotid bruits, or masses. Cardiac: irregularly irregular, no murmurs, rubs, or gallops. No clubbing, cyanosis, edema.  Radials/PT 2+ and equal bilaterally.  Respiratory:  Respirations regular and unlabored, clear to auscultation bilaterally. GI: Soft, nontender, nondistended. MS: No deformity or atrophy. Skin: Warm and dry, no rash. Neuro:  Strength and sensation are intact. Psych: Normal affect.  Assessment & Plan     Non-Obstructive CAD Recent cath in 10/2020 showed mild non-obstructive CAD. High-sensitivity troponin 251 >> 232 >> 216. EKG shows no acute ischemic changes. Echo showed LVEF of 50-55%.  No regional wall  motion abnormality. No additional ischemic evaluation felt to be needed given recent reassuring cardiac catheterization. Unclear what caused troponin elevation. Started on high-intensity statin given CAD and vertebral artery stenosis. No Aspirin due to need for DOAC and no beta blocker due to baseline bradycardia.   Chronic Diastolic CHF Echo showed LV function of 50 to 55%.  Unable to evaluate diastolic dysfunction.  Mildly reduced RV function. Euvolemic during admission. Patient has Lasix 20mg  daily listed under PTA mediation but reportedly has not been taking this.  Recommended to take Lasix as needed.   Permanent Atrial Fibrillation Rates in the 40s to 60s. No AV nodal agents due to baseline bradycardia. Do not think his bradycardia is the cause of her dizziness. Home Xarelto was initially held and patient was started on IV Heparin in case Echo was abnormal and repeat cardiac catheterization was needed. Will restart at discharge.  Hypertension BP mildly elevated at times this admission. Continue home Losartan.   Hyperlipidemia Lipid panel this admission: Total Cholesterol 226, Triglycerides 156, HDL 41, LDL 153.  LDL goal <70 given CAD and vertebral artery stenosis. Will start Crestor 40mg  daily. Will need repeat lipid panel and LFTs in 6-8 weeks.   Type 2 Diabetes Mellitus  Hemoglobin A1c 6.7 on 11/29/2020. Not on any medications at home. Will need to follow-up with PCP.   Chronic Dizziness Patient has chronic dizziness but reports it has been worse lately. Also reported double vision. Brain MRI showed no acute findings. 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 Have consulted Vascular Surgery to see if this could be the cause of his dizziness. He was seen by Dr. Stanford Breed who did not think his symptoms were from his vertebral artery stenosis. He reportedly has been seen by multiple different providers for this in the past. Per Dr. Stanford Breed, "if he sees Neurology as an  outpatient and they feel his symptoms are most likely from vertebral artery stenosis, I can do a vertebral artery transposition for him, but I am no sure this will give him any kind of relief given wide patency of the left vertebral artery and basilar artery." Recommend patient continue to follow-up for PCP with this.   Hypothyroidism Hashimoto Thyroiditis Patient has a history of hypothyroidism/Hashimoto; however, is not on any medications. He states he was previously on Methimazole but broke out into a rash with this. TSH normal at 1.55 and free T4 only slightly low at 0.55 on 11/29/2020. He is wondering if his hypothyroidism could be causing his chronic dizziness. However given free T4 is only slightly low and free TSH is normal, I don't think this is the cause. Recommend follow-up with PCP.   Aortic stenosis Echocardiogram this admission showed severe calcifcation of the  aortic valve. Aortic valve regurgitation is trivial. The aortic valve is  moderately to severely calcified with markedly reduced cusp excursion. By Doppler parameters stensosis is  likely moderate but visually appears more severe.  There was no evidence of aortic stenosis on recent cardiac catheterization.  His echocardiogram films reviewed by Dr. Harrell Gave who said poor quality images.  Dr. [name] was present there as well.  No further work-up recommended.  Signed, Tyberius Ryner [name], MD 12/18/2020, 8:56 AM [facility]

## 2020-12-19 ENCOUNTER — Encounter: Payer: Self-pay | Admitting: Cardiology

## 2020-12-19 ENCOUNTER — Other Ambulatory Visit: Payer: Self-pay

## 2020-12-19 ENCOUNTER — Telehealth (INDEPENDENT_AMBULATORY_CARE_PROVIDER_SITE_OTHER): Payer: Medicare Other | Admitting: Cardiology

## 2020-12-19 ENCOUNTER — Telehealth: Payer: Self-pay

## 2020-12-19 ENCOUNTER — Telehealth: Payer: Self-pay | Admitting: Cardiology

## 2020-12-19 ENCOUNTER — Ambulatory Visit: Payer: Medicare Other | Admitting: Cardiology

## 2020-12-19 VITALS — BP 167/80 | HR 56 | Ht 75.0 in | Wt 291.0 lb

## 2020-12-19 DIAGNOSIS — I25118 Atherosclerotic heart disease of native coronary artery with other forms of angina pectoris: Secondary | ICD-10-CM | POA: Diagnosis not present

## 2020-12-19 DIAGNOSIS — I38 Endocarditis, valve unspecified: Secondary | ICD-10-CM | POA: Diagnosis not present

## 2020-12-19 DIAGNOSIS — R42 Dizziness and giddiness: Secondary | ICD-10-CM

## 2020-12-19 DIAGNOSIS — Z7901 Long term (current) use of anticoagulants: Secondary | ICD-10-CM

## 2020-12-19 DIAGNOSIS — I4821 Permanent atrial fibrillation: Secondary | ICD-10-CM

## 2020-12-19 DIAGNOSIS — R531 Weakness: Secondary | ICD-10-CM

## 2020-12-19 NOTE — Telephone Encounter (Signed)
Patient calling in to see if he can switch his appt today to virtual because he is able to get to our office. Please advise

## 2020-12-19 NOTE — Progress Notes (Signed)
Virtual Visit via Video Note   This visit type was conducted due to national recommendations for restrictions regarding the COVID-19 Pandemic (e.g. social distancing) in an effort to limit this patient's exposure and mitigate transmission in our community.  Due to his co-morbid illnesses, this patient is at least at moderate risk for complications without adequate follow up.  This format is felt to be most appropriate for this patient at this time.  All issues noted in this document were discussed and addressed.  A limited physical exam was performed with this format.  Please refer to the patient's chart for his consent to telehealth for [facility].  Video Connection Lost Video connection was lost at > 50% of the duration of this visit, at which time the remainder of the visit was completed via audio only.      Date:  12/19/2020   ID:  Michael Whitney, DOB 18-Jul-1950, MRN [id number] The patient was identified using 2 identifiers.  Patient Location: Home Provider Location: Office/Clinic   PCP:  Elise Benne   [facility] Providers Cardiologist:  Sharia Averitt [name], MD Electrophysiologist:  Cristopher Peru, MD     Evaluation Performed:  Follow-Up Visit  Chief Complaint:  dizziness, double vision  History of Present Illness:    Michael Whitney is a 70 y.o. 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 He is seen for post hospital follow up.   Diagnosed with atrial fibrillation in 2019.  EP study by Dr. Lovena Le revealing PVCs arising near His bundle and ablation was not recommended due to potential for heart block.  He has been maintained on a rate control therapy with anticoagulation.  He has longstanding history of chronic dizziness. He states this started very slowly but has progressed over time and is happening every day.   Has been unrelated to changes in blood pressure or heart rate.  He has completed some element of physical therapy with minimal improvement.  ED visit at [facility] 10/01/2020.  He presented with feeling weak, dyspnea on exertion, chest pain, dizziness, lower extremity edema.  Troponin slightly elevated, BNP slightly elevated. TTE performed with bilateral atria severely dilated, LVEF 55 to 60%, RV moderately to severely dilated, RVSF moderately reduced, mild to moderate MR, moderate TR, mild to moderate pulmonary hypertension.  He was recommended for diuresis.  He was also started on Protonix 40 mg twice daily for 30 days and then decrease to once daily after 1 month at follow-up with PCP.  His hydrochlorothiazide was increased to 25 mg daily.  Imdur 30 mg initiated.  Discharged on 7 day course of Lasix. He was recommended for consideration of right heart cath as an outpatient.  Seen 10/16/20 noting dizziness which was persistent. He was concerned about weight gain and wondered if thyroid contributory. He endorsed pain as well as dyspnea on exertion was set up for right and left cardiac catheterization.  Underwent right and left cardiac catheterization 10/24/2020 showing nonobstructive coronary artery disease, minimally elevated left heart filling pressures, mild pulmonary hypertension, normal cardiac output.  He was on appropriate diuretic therapy and noted to have some component of diastolic heart failure.    A PYP scan to evaluate for possible amyloid could be considered.  Patient presented to the ED on 12/11/2020 for further evaluation of chronic dizziness which had recently worsened. He also reported blurred/double vision. He denied any loss of  vision, headache, or trouble swallowing. He does have some balance issues though. He did reported an episode of substernal chest tightness that lasted for a few hours a few weeks prior to admission but no recurrent chest pain since then.    In the ED,  EKG showed atrial fibrillation with no acute ischemic changes. High-sensitivity troponin 251 >> 232 >> 216. Trend felt to be c/w demand ischemia. CBC was completely normal. Na 137, K 5.0, Glucose 111, BUN 23, Cr 1.36. 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 Brain MRI showed no acute findings. He was started on statin therapy. Seen by vascular surgery who felt his vertebral stensosis was not causing his dizziness. Follow up with Neuro and Endocrinology recommended. Echo was unchanged.   On follow up today he still complains of daily dizziness, weakness, fatigue and visual changes. He is frustrated that unified etiology has not been identified.   The patient does not have symptoms concerning for COVID-19 infection (fever, chills, cough, or new shortness of breath).    Past Medical History:  Diagnosis Date   Atrial fibrillation ([place])    Cataract    Diabetes mellitus without complication ([place])    Hypertension    Seizures ([place])    Past Surgical History:  Procedure Laterality Date   CATARACT EXTRACTION     PVC ABLATION N/A 07/14/2017   Procedure: PVC ABLATION;  Surgeon: Evans Lance, MD;  Location: [place] [facility];  Service: Cardiovascular;  Laterality: N/A;   RIGHT/LEFT HEART CATH AND CORONARY ANGIOGRAPHY N/A 10/24/2020   Procedure: RIGHT/LEFT HEART CATH AND CORONARY ANGIOGRAPHY;  Surgeon: [name], Horton Ellithorpe M, MD;  Location: [place] [facility];  Service: Cardiovascular;  Laterality: N/A;   TONSILLECTOMY     VARICOSE VEIN SURGERY       Current Meds  Medication Sig   Accu-Chek FastClix Lancets MISC Use 1 lancet daily to test blood sugar (E11.9)   acetaminophen (TYLENOL) 500 MG tablet Take 1,000 mg by  mouth every 6 (six) hours as needed for moderate pain or headache.   Artificial Tear Ointment (DRY EYES OP) Place 1 drop into both eyes daily.   azelastine (ASTELIN) 0.1 % nasal spray Place 1 spray into both nostrils 2 (two) times daily as needed for rhinitis. Use in each nostril as directed   Cholecalciferol (VITAMIN D-3) 25 MCG (1000 UT) CAPS Take 1,000 Units by mouth daily.   COVID-19 mRNA bivalent vaccine, Pfizer, injection Inject into the muscle.   furosemide (LASIX) 20 MG tablet Take 1 tablet (20 mg total) by mouth daily as needed for edema or fluid.   losartan (COZAAR) 100 MG tablet Take 50 mg by mouth daily.   Multiple Vitamin (MULTI-VITAMIN) tablet Take 1 tablet by mouth daily.   Omega-3 Fatty Acids (FISH OIL) 1000 MG CAPS Take 1,000 mg by mouth daily.   triamcinolone cream (KENALOG) 0.1 % Apply 1 application topically 3 (three) times a week.   vitamin B-12 (CYANOCOBALAMIN) 1000 MCG tablet Take 1,000 mcg by mouth daily.   Vitamin D, Ergocalciferol, (DRISDOL) 1.25 MG (50000 UNIT) CAPS capsule Take 50,000 Units by mouth every Tuesday.   XARELTO 20 MG TABS tablet TAKE 1 TABLET(20 MG) BY MOUTH DAILY WITH SUPPER (Patient taking differently: Take 20 mg by mouth  daily.)     Allergies:   Patient has no active allergies.   Social History   Tobacco Use   Smoking status: Never   Smokeless tobacco: Never  Vaping Use   Vaping Use: Never used  Substance Use Topics   Alcohol use: Yes    Alcohol/week: 2.0 standard drinks    Types: 1 Shots of liquor, 1 Cans of beer per week    Comment: occasionally   Drug use: No     Family Hx: The patient's family history includes CAD in his mother.  ROS:   Please see the history of present illness.    All other systems reviewed and are negative.   Prior CV studies:   The following studies were reviewed today:  Echo 10/02/20 The left ventricular size is normal. There is severe concentric left  ventricular hypertrophy. The left ventricular wall  motion is normal.  Left ventricular systolic function is normal. LV ejection fraction =  55-60%.  Left ventricular filling pattern is pseudonormal. LAP is  indeterminate.  The right ventricle is moderate to severely dilated. The right  ventricular systolic function is moderately reduced.  The atria are severely dilated.  There is mild to moderate mitral regurgitation.  There is moderate tricuspid regurgitation.  Mild to moderate pulmonary hypertension.  Estimated right ventricular systolic pressure is 46 mmHg.  The IVC is dilated with an abnormal collapsibility index, this  suggestive of increased right atrial pressure.   Progression of RV changes and atrial dilation compared to 2020 study.    RIGHT/LEFT HEART CATH AND CORONARY ANGIOGRAPHY   Conclusion      Ost LM to Mid LM lesion is 30% stenosed.   Prox LAD lesion is 30% stenosed.   Mid Cx lesion is 45% stenosed.   Prox RCA to Mid RCA lesion is 30% stenosed.   The left ventricular systolic function is normal.   LV end diastolic pressure is mildly elevated.   The left ventricular ejection fraction is 55-65% by visual estimate.   Hemodynamic findings consistent with mild pulmonary hypertension.   There is no aortic valve stenosis.   There is trivial (1+) mitral regurgitation.   Nonobstructive CAD Minimally elevated left heart filling pressures.  Mild pulmonary HTN. Normal cardiac output.   Plan: patient is on appropriate diuretic therapy (considering he was hydrated prior to cath). Would consider evaluating possible pulmonary cause of his dyspnea. He does have some component of diastolic CHF. May consider PYP scan to evaluate for possible amyloid    Echo 12/12/20:IMPRESSIONS     1. Left ventricular ejection fraction, by estimation, is 50 to 55%. The  left ventricle has low normal function. The left ventricle has no regional  wall motion abnormalities. There is mild left ventricular hypertrophy.  Left ventricular  diastolic function   could not be evaluated.   2. Right ventricular systolic function is mildly reduced. The right  ventricular size is normal.   3. Left atrial size was severely dilated.   4. Right atrial size was moderately dilated.   5. The mitral valve is normal in structure. No evidence of mitral valve  regurgitation. No evidence of mitral stenosis.   6. The aortic valve is tricuspid. There is severe calcifcation of the  aortic valve. Aortic valve regurgitation is trivial. The aortic valve is  moderately to severely calcified with markedly reduced cusp excursion. By  Doppler parameters stensosis is  likely moderate but visually appears more severe.   7. The inferior vena cava is  dilated in size with >50% respiratory  variability, suggesting right atrial pressure of 8 mmHg.      Labs/Other Tests and Data Reviewed:    EKG:  No ECG reviewed.  Recent Labs: 11/29/2020: TSH 1.55 12/06/2020: ALT 28 12/11/2020: Hemoglobin 16.6; Platelets 159 12/12/2020: BUN 23; Creatinine, Ser 1.36; Potassium 5.0; Sodium 137   Recent Lipid Panel Lab Results  Component Value Date/Time   CHOL 226 (H) 11/29/2020 10:34 AM   TRIG 156.0 (H) 11/29/2020 10:34 AM   HDL 41.00 11/29/2020 10:34 AM   CHOLHDL 6 11/29/2020 10:34 AM   LDLCALC 153 (H) 11/29/2020 10:34 AM    Wt Readings from Last 3 Encounters:  12/19/20 291 lb (132 kg)  12/12/20 288 lb 1.6 oz (130.7 kg)  12/06/20 295 lb 6.4 oz (134 kg)     Risk Assessment/Calculations:    CHA2DS2-VASc Score = 2   This indicates a 2.2% annual risk of stroke. The patient's score is based upon: CHF History: 0 HTN History: 1 Diabetes History: 0 Stroke History: 0 Vascular Disease History: 0 Age Score: 1 Gender Score: 0         Objective:    Vital Signs:  BP (!) 167/80 (BP Location: Left Arm, Patient Position: Sitting, Cuff Size: Normal)   Pulse (!) 56   Ht 6\' 3"  (1.905 m)   Wt 291 lb (132 kg)   BMI 36.37 kg/m      ASSESSMENT & PLAN:     Non-Obstructive CAD Recent cath in 10/2020 showed mild non-obstructive CAD. High-sensitivity troponin 251 >> 232 >> 216. EKG shows no acute ischemic changes. Echo showed LVEF of 50-55%.  No regional wall motion abnormality. No additional ischemic evaluation felt to be needed given recent reassuring cardiac catheterization. Suspect troponin elevation either demand ischemia or chronically elevated due to metabolic issues. Continue high-intensity statin given CAD and vertebral artery stenosis. No Aspirin due to need for DOAC and no beta blocker due to baseline bradycardia.   Chronic Diastolic CHF Echo showed LV function of 50 to 55%.  Unable to evaluate diastolic dysfunction.  Mildly reduced RV function. Euvolemic during admission. Patient has Lasix 20mg  daily to take PRN.   Permanent Atrial Fibrillation  No AV nodal agents due to baseline bradycardia. Do not think his bradycardia is the cause of her dizziness. on Xarelto.    Hypertension  Continue Losartan.  Hyperlipidemia Now on high dose statin. Needs repeat labs in 3 months.  Type 2 Diabetes Mellitus  Hemoglobin A1c 6.7 on 11/29/2020. Not on any medications at home. Will need to follow-up with PCP.  Chronic Dizziness Patient has chronic dizziness but reports it has been worse lately. Also reported double vision. Brain MRI showed no acute findings. 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 Have consulted Vascular Surgery to see if this could be the cause of his dizziness. He was seen by Dr. Stanford Breed who did not think his symptoms were from his vertebral artery stenosis. Needs follow up with Neuro and Endocrinology   Hypothyroidism Hashimoto Thyroiditis Patient has a history of hypothyroidism/Hashimoto; He states he was previously on Methimazole but broke out into a rash with this. Apparently had RAI therapy in the past. TSH normal at 1.55 and free T4 only slightly low at 0.55 on 11/29/2020. He is wondering if his  Hashimoto's could be causing his chronic dizziness.   Aortic stenosis Echocardiogram this admission showed severe calcifcation of the  aortic valve. Aortic valve regurgitation is trivial. The aortic valve is  moderately to severely calcified with markedly reduced cusp excursion. By Doppler parameters stensosis is  likely moderate There was no evidence of aortic stenosis on recent cardiac catheterization. Will monitor for now.         COVID-19 Education: The signs and symptoms of COVID-19 were discussed with the patient and how to seek care for testing (follow up with PCP or arrange E-visit).  The importance of social distancing was discussed today.  Time:   Today, I have spent 10 minutes with the patient with telehealth technology discussing the above problems.     Medication Adjustments/Labs and Tests Ordered: Current medicines are reviewed at length with the patient today.  Concerns regarding medicines are outlined above.   Tests Ordered: No orders of the defined types were placed in this encounter.   Medication Changes: No orders of the defined types were placed in this encounter.   Follow Up:  In Person in 4 month(s)  Signed, Arlo Butt [name], MD  12/19/2020 2:58 PM    [place]

## 2020-12-19 NOTE — Patient Instructions (Signed)
Medication Instructions:  Continue same medications *If you need a refill on your cardiac medications before your next appointment, please call your pharmacy*   Lab Work: None ordered   Testing/Procedures: None ordered   Follow-Up: At [facility], you and your health needs are our priority.  As part of our continuing mission to provide you with exceptional heart care, we have created designated Provider Care Teams.  These Care Teams include your primary Cardiologist (physician) and Advanced Practice Providers (APPs -  Physician Assistants and Nurse Practitioners) who all work together to provide you with the care you need, when you need it.  We recommend signing up for the patient portal called "MyChart".  Sign up information is provided on this After Visit Summary.  MyChart is used to connect with patients for Virtual Visits (Telemedicine).  Patients are able to view lab/test results, encounter notes, upcoming appointments, etc.  Non-urgent messages can be sent to your provider as well.   To learn more about what you can do with MyChart, go to [email or web address].      Your next appointment:  Friday  04/18/21 at 1:20 pm    The format for your next appointment:  Office   Provider:  Dr.Jordan

## 2020-12-19 NOTE — Telephone Encounter (Signed)
Spoke to patient advised appointment this afternoon with Dr.Jordan changed to a mychart video visit at 3:30 pm.

## 2020-12-19 NOTE — Telephone Encounter (Signed)
Routed to [place] LPN to advise on appt type change

## 2020-12-19 NOTE — Telephone Encounter (Signed)
  Patient Consent for Virtual Visit        Michael Whitney has provided verbal consent on 12/19/2020 for a virtual visit (video or telephone).   CONSENT FOR VIRTUAL VISIT FOR:  Michael Whitney  By participating in this virtual visit I agree to the following:  I hereby voluntarily request, consent and authorize [facility] and its employed or contracted physicians, physician assistants, nurse practitioners or other licensed health care professionals (the Practitioner), to provide me with telemedicine health care services (the "Services") as deemed necessary by the treating Practitioner. I acknowledge and consent to receive the Services by the Practitioner via telemedicine. I understand that the telemedicine visit will involve communicating with the Practitioner through live audiovisual communication technology and the disclosure of certain medical information by electronic transmission. I acknowledge that I have been given the opportunity to request an in-person assessment or other available alternative prior to the telemedicine visit and am voluntarily participating in the telemedicine visit.  I understand that I have the right to withhold or withdraw my consent to the use of telemedicine in the course of my care at any time, without affecting my right to future care or treatment, and that the Practitioner or I may terminate the telemedicine visit at any time. I understand that I have the right to inspect all information obtained and/or recorded in the course of the telemedicine visit and may receive copies of available information for a reasonable fee.  I understand that some of the potential risks of receiving the Services via telemedicine include:  Delay or interruption in medical evaluation due to technological equipment failure or disruption; Information transmitted may not be sufficient (e.g. poor resolution of images) to allow for appropriate medical decision making by the Practitioner; and/or   In rare instances, security protocols could fail, causing a breach of personal health information.  Furthermore, I acknowledge that it is my responsibility to provide information about my medical history, conditions and care that is complete and accurate to the best of my ability. I acknowledge that Practitioner's advice, recommendations, and/or decision may be based on factors not within their control, such as incomplete or inaccurate data provided by me or distortions of diagnostic images or specimens that may result from electronic transmissions. I understand that the practice of medicine is not an exact science and that Practitioner makes no warranties or guarantees regarding treatment outcomes. I acknowledge that a copy of this consent can be made available to me via my patient portal ([facility]), or I can request a printed copy by calling the office of [facility].    I understand that my insurance will be billed for this visit.   I have read or had this consent read to me. I understand the contents of this consent, which adequately explains the benefits and risks of the Services being provided via telemedicine.  I have been provided ample opportunity to ask questions regarding this consent and the Services and have had my questions answered to my satisfaction. I give my informed consent for the services to be provided through the use of telemedicine in my medical care

## 2020-12-20 ENCOUNTER — Telehealth: Payer: Self-pay

## 2020-12-20 DIAGNOSIS — I10 Essential (primary) hypertension: Secondary | ICD-10-CM

## 2020-12-20 NOTE — Telephone Encounter (Signed)
Transition Care Management Follow-up Telephone Call Date of discharge and from where: 12/12/20 from [facility] How have you been since you were released from the hospital? "reports way of balance, movement affected". Video visit with cardiologist 12/19/20. Denies dizziness and states "like a pinched nerve".  Any questions or concerns? Yes concerned it is related to an Endocrine problem Patient encouraged to notify PCP of concerns and/or go to ED if condition worsens or does not improve. Items Reviewed: Did the pt receive and understand the discharge instructions provided? Yes  Medications obtained and verified? Yes  Other? No  Any new allergies since your discharge? No  Dietary orders reviewed? Yes Do you have support at home? No, has a friend, but friend works.  Home Care and Equipment/Supplies: Were home health services ordered? not applicable If so, what is the name of the agency? Not applicable  Has the agency set up a time to come to the patient's home? not applicable Were any new equipment or medical supplies ordered?  No What is the name of the medical supply agency? Not applicable Were you able to get the supplies/equipment? not applicable Do you have any questions related to the use of the equipment or supplies? No  Functional Questionnaire: (I = Independent and D = Dependent) ADLs: I  Bathing/Dressing- I  Meal Prep- I  Eating- I  Maintaining continence- I  Transferring/Ambulation- I  Managing Meds- I  Follow up appointments reviewed:  PCP Hospital f/u appt confirmed? No  Patient states will call to schedule.  [facility] f/u appt confirmed?  Video visit with cardiologist on 01/19/21.    Are transportation arrangements needed?  Currently does not feel he should be driving. Discussed transportation benefit with his health plan and [facility] [employer] number provided. If their condition worsens, is the pt aware to call PCP or go to the Emergency Dept.? Yes Was the  patient provided with contact information for the PCP's office or ED? Yes Was to pt encouraged to call back with questions or concerns? Yes   Thea Silversmith, RN, MSN, BSN, CCM Care Management Coordinator [phone]

## 2020-12-21 NOTE — Addendum Note (Signed)
Addended by: Luretha Rued on: 12/21/2020 08:18 AM   Modules accepted: Orders

## 2020-12-23 ENCOUNTER — Telehealth: Payer: Self-pay | Admitting: *Deleted

## 2020-12-23 NOTE — Telephone Encounter (Signed)
   Telephone encounter was:  Successful.  12/23/2020 Name: Michael Whitney MRN: [id number] DOB: 12-12-1950  Michael Whitney is a 70 y.o. year old male who is a primary care patient of Saguier, Iris Pert . The community resource team was consulted for assistance with Transportation Needs   Care guide performed the following interventions: Patient provided with information about care guide support team and interviewed to confirm resource needs.  Follow Up Will let patient know transportation is set up    [place], Care Management  [phone] [address] , [place] [address] Email : [email or web address]

## 2020-12-23 NOTE — Telephone Encounter (Signed)
   Telephone encounter was:  Successful.  12/23/2020 Name: Michael Whitney MRN: [id number] DOB: Dec 05, 1950  Michael Whitney is a 70 y.o. year old male who is a primary care patient of Saguier, Iris Pert . The community resource team was consulted for assistance with Transportation Needs let patient know transportaion is set up .Marland Kitchen   Care guide performed the following interventions: Patient provided with information about care guide support team and interviewed to confirm resource needs.  Follow Up Plan:  No further follow up planned at this time. The patient has been provided with needed resources.  [place], Care Management  [phone] [address] , [place] [address] Email : [email or web address]

## 2020-12-24 ENCOUNTER — Ambulatory Visit (INDEPENDENT_AMBULATORY_CARE_PROVIDER_SITE_OTHER): Payer: Medicare Other | Admitting: Medical

## 2020-12-24 ENCOUNTER — Encounter: Payer: Self-pay | Admitting: Medical

## 2020-12-24 ENCOUNTER — Other Ambulatory Visit: Payer: Self-pay

## 2020-12-24 VITALS — BP 150/80 | HR 71 | Temp 98.0°F | Resp 18 | Ht 75.0 in | Wt 293.8 lb

## 2020-12-24 DIAGNOSIS — I10 Essential (primary) hypertension: Secondary | ICD-10-CM | POA: Diagnosis not present

## 2020-12-24 DIAGNOSIS — E041 Nontoxic single thyroid nodule: Secondary | ICD-10-CM

## 2020-12-24 DIAGNOSIS — R5383 Other fatigue: Secondary | ICD-10-CM

## 2020-12-24 DIAGNOSIS — R7989 Other specified abnormal findings of blood chemistry: Secondary | ICD-10-CM | POA: Diagnosis not present

## 2020-12-24 DIAGNOSIS — R42 Dizziness and giddiness: Secondary | ICD-10-CM

## 2020-12-24 DIAGNOSIS — I4819 Other persistent atrial fibrillation: Secondary | ICD-10-CM

## 2020-12-24 NOTE — Patient Instructions (Addendum)
Mild low t4 with hx of hyperthyroid per 03-06-20 note review, thyroid nodules, radioacitve iodine treatment, brief methimazole treatment before stopping due to rash and now worse dizziness, faigue, wt gain along with dry skin.  Will get repeat tsh, t4 and thyroid peroxidase antibodies.   On review as recently as summer told thyroid normal and no thyroid supplamentatoin given. Will follow labs and get opinion from supervising MD, Dr. Benancio Deeds I am trying to refer pt to) or may try to reach out to last endocrinologist pt saw this summer. With new thyroid values treatment recommendations might change.  Chronic dizziness. No cause found.Patient has chronic dizziness but reports it has been worse lately. Also reported double vision. Brain MRI showed no acute findings. 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 Have consulted Vascular Surgery to see if this could be the cause of his dizziness. He was seen by Dr. Stanford Breed who did not think his symptoms were from his vertebral artery stenosis. He reportedly has been seen by multiple different providers for this in the past. Per Dr. Stanford Breed, "if he sees Neurology as an outpatient and they feel his symptoms are most likely from vertebral artery stenosis, I can do a vertebral artery transposition for him, but I am no sure this will give him any kind of relief given wide patency of the left vertebral artery and basilar artery." Recommend patient continue to follow-up for PCP with this. Will make referral to neurologist.  For htn continue to check bp daily and update me on reading in 7 days. Your at home reading were good. If bp readings reading at home trend above 140/90 will need to make changes.   Chf, bradycarida, atrial fibrillation and aortic stenosis. Follow by cardiology. Recent had follow up post hospitalization with cardiologist. Keep follow up with Dr. [name] in Feb 2023.  Follow up date with me to be determined after lab review.

## 2020-12-24 NOTE — Progress Notes (Signed)
Subjective:    Patient ID: Michael Whitney, male    DOB: 1950-12-21, 70 y.o.   MRN: [id number]  HPI Pt in for follow up from hospitilization just recently.    He had quite extensive work up in the ED for severe dizziness and then was admitted.. This has occurred for years and has not resolved despite evaluation and treatment by ent, neurologist and PT.   I have referred pt to Dr. Kelton Pillar and he was told may take 2-3 months to be seen.   Pt t4 level was low. His tsh was 1.55.   In the past pt was on methimazole but had rash. Then he had radioactive iodine in the past. Pt had to stop methimazole. Pt makes comment that he was told never fully ablated thyroid gland.  09/02/2020 his thyroid levels were normal. Pt endocrinologist office stated this? I don't see thyroid labs.  Pt has history of thyoid nodules.   He has fatigue, weight gain and he states dry skin.  Pt indicates that he does not want to be seen by endocrinologist at [facility].     Recent hospital dc summary.   Admit date: 12/10/2020 Discharge date: 12/12/2020  Consultants: Vascular Surgery   Elevated Troponin Non-Obstructive CAD Recent cath in 10/2020 showed mild non-obstructive CAD. High-sensitivity troponin 251 >> 232 >> 216. EKG shows no acute ischemic changes. Echo showed LVEF of 50-55%.  No regional wall motion abnormality. No additional ischemic evaluation felt to be needed given recent reassuring cardiac catheterization. Unclear what caused troponin elevation. Started on high-intensity statin given CAD and vertebral artery stenosis. No Aspirin due to need for DOAC and no beta blocker due to baseline bradycardia.   Chronic Diastolic CHF Echo showed LV function of 50 to 55%.  Unable to evaluate diastolic dysfunction.  Mildly reduced RV function. Euvolemic during admission. Patient has Lasix 20mg  daily listed under PTA mediation but reportedly has not been taking this.  Recommended to take Lasix as needed.    Permanent Atrial Fibrillation Rates in the 40s to 60s. No AV nodal agents due to baseline bradycardia. Do not think his bradycardia is the cause of her dizziness. Home Xarelto was initially held and patient was started on IV Heparin in case Echo was abnormal and repeat cardiac catheterization was needed. Will restart at discharge.    Hypertension BP mildly elevated at times this admission. Continue home Losartan.   Hyperlipidemia Lipid panel this admission: Total Cholesterol 226, Triglycerides 156, HDL 41, LDL 153.  LDL goal <70 given CAD and vertebral artery stenosis. Will start Crestor 40mg  daily. Will need repeat lipid panel and LFTs in 6-8 weeks.   Type 2 Diabetes Mellitus  Hemoglobin A1c 6.7 on 11/29/2020. Not on any medications at home. Will need to follow-up with PCP.   Chronic Dizziness Patient has chronic dizziness but reports it has been worse lately. Also reported double vision. Brain MRI showed no acute findings. 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 Have consulted Vascular Surgery to see if this could be the cause of his dizziness. He was seen by Dr. Stanford Breed who did not think his symptoms were from his vertebral artery stenosis. He reportedly has been seen by multiple different providers for this in the past. Per Dr. Stanford Breed, "if he sees Neurology as an outpatient and they feel his symptoms are most likely from vertebral artery stenosis, I can do a vertebral artery transposition for him, but I am no sure this will give him any  kind of relief given wide patency of the left vertebral artery and basilar artery." Recommend patient continue to follow-up for PCP with this.   Hypothyroidism Hashimoto Thyroiditis Patient has a history of hypothyroidism/Hashimoto; however, is not on any medications. He states he was previously on Methimazole but broke out into a rash with this. TSH normal at 1.55 and free T4 only slightly low at 0.55 on 11/29/2020. He is wondering if  his hypothyroidism could be causing his chronic dizziness. However given free T4 is only slightly low and free TSH is normal, I don't think this is the cause. Recommend follow-up with PCP.   Aortic stenosis Echocardiogram this admission showed severe calcifcation of the  aortic valve. Aortic valve regurgitation is trivial. The aortic valve is  moderately to severely calcified with markedly reduced cusp excursion. By Doppler parameters stensosis is  likely moderate but visually appears more severe.  There was no evidence of aortic stenosis on recent cardiac catheterization.  His echocardiogram films reviewed by Dr. Harrell Gave who said poor quality images.  Dr. [name] was present there as well.  No further work-up recommended.   Patient seen and examined by Dr. Irish Lack and determined to be stable for discharge. Outpatient follow-up as been arranged. Medications as below.   Did the patient have an acute coronary syndrome (MI, NSTEMI, STEMI, etc) this admission?:  No.   The elevated Troponin was due to the acute medical illness (demand ischemia).   Pt bp is 129//65(today other readings., 130/64 and [id number]  I reviewed 03/06/2020 note in care everywhere.    Review of Systems  Constitutional:  Negative for chills, fatigue and fever.  Respiratory:  Negative for cough, chest tightness, shortness of breath and wheezing.   Cardiovascular:  Negative for chest pain and palpitations.  Gastrointestinal:  Negative for abdominal pain.  Musculoskeletal:  Negative for back pain.  Skin:  Negative for rash.    Past Medical History:  Diagnosis Date   Atrial fibrillation ([place])    Cataract    Diabetes mellitus without complication ([place])    Hypertension    Seizures ([place])      Social History   Socioeconomic History   Marital status: Widowed    Spouse name: Not on file   Number of children: 2   Years of education: Not on file   Highest education level: Not on file  Occupational History   Occupation:  retired    Comment: Geophysicist/field seismologist  Tobacco Use   Smoking status: Never   Smokeless tobacco: Never  Vaping Use   Vaping Use: Never used  Substance and Sexual Activity   Alcohol use: Yes    Alcohol/week: 2.0 standard drinks    Types: 1 Shots of liquor, 1 Cans of beer per week    Comment: occasionally   Drug use: No   Sexual activity: Not on file  Other Topics Concern   Not on file  Social History Narrative   Not on file   Social Determinants of Health   Financial Resource Strain: Not on file  Food Insecurity: Not on file  Transportation Needs: Not on file  Physical Activity: Not on file  Stress: Not on file  Social Connections: Not on file  Intimate Partner Violence: Not on file    Past Surgical History:  Procedure Laterality Date   CATARACT EXTRACTION     PVC ABLATION N/A 07/14/2017   Procedure: PVC ABLATION;  Surgeon: Evans Lance, MD;  Location: [facility];  Service: Cardiovascular;  Laterality:  N/A;   RIGHT/LEFT HEART CATH AND CORONARY ANGIOGRAPHY N/A 10/24/2020   Procedure: RIGHT/LEFT HEART CATH AND CORONARY ANGIOGRAPHY;  Surgeon: [name], Peter M, MD;  Location: [facility];  Service: Cardiovascular;  Laterality: N/A;   TONSILLECTOMY     VARICOSE VEIN SURGERY      Family History  Problem Relation Age of Onset   CAD Mother        MI at age 75    No Active Allergies  Current Outpatient Medications on File Prior to Visit  Medication Sig Dispense Refill   Accu-Chek FastClix Lancets MISC Use 1 lancet daily to test blood sugar (E11.9)     acetaminophen (TYLENOL) 500 MG tablet Take 1,000 mg by mouth every 6 (six) hours as needed for moderate pain or headache.     Artificial Tear Ointment (DRY EYES OP) Place 1 drop into both eyes daily.     azelastine (ASTELIN) 0.1 % nasal spray Place 1 spray into both nostrils 2 (two) times daily as needed for rhinitis. Use in each nostril as directed 30 mL 12   Cholecalciferol (VITAMIN D-3) 25 MCG (1000 UT) CAPS Take  1,000 Units by mouth daily.     COVID-19 mRNA bivalent vaccine, Pfizer, injection Inject into the muscle. 0.3 mL 0   furosemide (LASIX) 20 MG tablet Take 1 tablet (20 mg total) by mouth daily as needed for edema or fluid. 30 tablet 2   losartan (COZAAR) 100 MG tablet Take 50 mg by mouth daily. 90 tablet 3   Multiple Vitamin (MULTI-VITAMIN) tablet Take 1 tablet by mouth daily.     Omega-3 Fatty Acids (FISH OIL) 1000 MG CAPS Take 1,000 mg by mouth daily.     OVER THE COUNTER MEDICATION Take 6 drops by mouth daily. Vitamins A, D, and K     triamcinolone cream (KENALOG) 0.1 % Apply 1 application topically 3 (three) times a week.     vitamin B-12 (CYANOCOBALAMIN) 1000 MCG tablet Take 1,000 mcg by mouth daily.     Vitamin D, Ergocalciferol, (DRISDOL) 1.25 MG (50000 UNIT) CAPS capsule Take 50,000 Units by mouth every Tuesday.     XARELTO 20 MG TABS tablet TAKE 1 TABLET(20 MG) BY MOUTH DAILY WITH SUPPER (Patient taking differently: Take 20 mg by mouth daily.) 90 tablet 1   No current facility-administered medications on file prior to visit.    BP (!) 167/77   Pulse 71   Temp 98 F (36.7 C)   Resp 18   Ht 6\' 3"  (1.905 m)   Wt 293 lb 12.8 oz (133.3 kg)   SpO2 99%   BMI 36.72 kg/m       Objective:   Physical Exam   General Mental Status- Alert. General Appearance- Not in acute distress.   Skin General: Color- Normal Color. Moisture- Normal Moisture.  Neck Carotid Arteries- Normal color. Moisture- Normal Moisture. No carotid bruits. No JVD.  Chest and Lung Exam Auscultation: Breath Sounds:-Normal.  Cardiovascular Auscultation:Rythm- Regular. Murmurs & Other Heart Sounds:Auscultation of the heart reveals- No Murmurs.  Abdomen Inspection:-Inspeection Normal. Palpation/Percussion:Note:No mass. Palpation and Percussion of the abdomen reveal- Non Tender, Non Distended + BS, no rebound or guarding.    Neurologic Cranial Nerve exam:- CN III-XII intact(No nystagmus), symmetric  smile. Drift Test:- No drift. Romberg Exam:- Negative.  Heal to Toe Gait exam:-Normal. Finger to Nose:- Normal/Intact Strength:- 5/5 equal and symmetric strength both upper and lower extremities.      Assessment & Plan:   Patient Instructions  Mild low t4 with hx of hyperthyroid per 03-06-20 note review, thyroid nodules, radioacitve iodine treatment, brief methimazole treatment before stopping due to rash and now worse dizziness, faigue, wt gain along with dry skin.  Will get repeat tsh, t4 and thyroid peroxidase antibodies.   On review as recently as summer told thyroid normal and no thyroid supplamentatoin given. Will follow labs and get opinion from supervising MD, Dr. Benancio Deeds I am trying to refer pt to) or may try to reach out to last endocrinologist pt saw this summer. With new thyroid values treatment recommendations might change.  Chronic dizziness. No cause found.Patient has chronic dizziness but reports it has been worse lately. Also reported double vision. Brain MRI showed no acute findings. 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 Have consulted Vascular Surgery to see if this could be the cause of his dizziness. He was seen by Dr. Stanford Breed who did not think his symptoms were from his vertebral artery stenosis. He reportedly has been seen by multiple different providers for this in the past. Per Dr. Stanford Breed, "if he sees Neurology as an outpatient and they feel his symptoms are most likely from vertebral artery stenosis, I can do a vertebral artery transposition for him, but I am no sure this will give him any kind of relief given wide patency of the left vertebral artery and basilar artery." Recommend patient continue to follow-up for PCP with this. Will make referral to neurologist.  For htn continue to check bp daily and update me on reading in 7 days. Your at home reading were good. If bp readings reading at home trend above 140/90 will need to make  changes.   Chf, bradycarida, atrial fibrillation and aortic stenosis. Follow by cardiology. Recent had follow up post hospitalization with cardiologist. Keep follow up with Dr. [name] in Feb 2023.  Follow up date with me to be determined after lab review.   Mackie Pai, PA-C   Time spent with patient today was 45+  minutes which consisted of chart revdiew, discussing diagnosis, work up treatment and documentation.

## 2020-12-25 LAB — CBC WITH DIFFERENTIAL/PLATELET
Basophils Absolute: 0.1 10*3/uL (ref 0.0–0.1)
Basophils Relative: 1 % (ref 0.0–3.0)
Eosinophils Absolute: 0.2 10*3/uL (ref 0.0–0.7)
Eosinophils Relative: 2.8 % (ref 0.0–5.0)
HCT: 49.2 % (ref 39.0–52.0)
Hemoglobin: 16.4 g/dL (ref 13.0–17.0)
Lymphocytes Relative: 27.4 % (ref 12.0–46.0)
Lymphs Abs: 1.7 10*3/uL (ref 0.7–4.0)
MCHC: 33.3 g/dL (ref 30.0–36.0)
MCV: 101 fl — ABNORMAL HIGH (ref 78.0–100.0)
Monocytes Absolute: 0.5 10*3/uL (ref 0.1–1.0)
Monocytes Relative: 8.3 % (ref 3.0–12.0)
Neutro Abs: 3.9 10*3/uL (ref 1.4–7.7)
Neutrophils Relative %: 60.5 % (ref 43.0–77.0)
Platelets: 140 10*3/uL — ABNORMAL LOW (ref 150.0–400.0)
RBC: 4.87 Mil/uL (ref 4.22–5.81)
RDW: 14.3 % (ref 11.5–15.5)
WBC: 6.4 10*3/uL (ref 4.0–10.5)

## 2020-12-25 LAB — COMPREHENSIVE METABOLIC PANEL
ALT: 29 U/L (ref 0–53)
AST: 34 U/L (ref 0–37)
Albumin: 4.7 g/dL (ref 3.5–5.2)
Alkaline Phosphatase: 43 U/L (ref 39–117)
BUN: 27 mg/dL — ABNORMAL HIGH (ref 6–23)
CO2: 29 mEq/L (ref 19–32)
Calcium: 9.5 mg/dL (ref 8.4–10.5)
Chloride: 101 mEq/L (ref 96–112)
Creatinine, Ser: 1.33 mg/dL (ref 0.40–1.50)
GFR: 53.43 mL/min — ABNORMAL LOW (ref >60.00)
Glucose, Bld: 112 mg/dL — ABNORMAL HIGH (ref 70–99)
Potassium: 4.9 mEq/L (ref 3.5–5.1)
Sodium: 141 mEq/L (ref 135–145)
Total Bilirubin: 0.6 mg/dL (ref 0.2–1.2)
Total Protein: 7 g/dL (ref 6.0–8.3)

## 2020-12-25 LAB — T4, FREE: Free T4: 0.29 ng/dL — ABNORMAL LOW (ref 0.60–1.60)

## 2020-12-25 LAB — TSH: TSH: 10.02 u[IU]/mL — ABNORMAL HIGH (ref 0.35–5.50)

## 2020-12-25 LAB — THYROID PEROXIDASE ANTIBODY: Thyroperoxidase Ab SerPl-aCnc: 238 IU/mL — ABNORMAL HIGH (ref <9)

## 2020-12-27 MED ORDER — LEVOTHYROXINE SODIUM 25 MCG PO TABS: 25.0000 ug | ORAL_TABLET | Freq: Every day | ORAL | 3 refills | Status: DC

## 2020-12-27 NOTE — Addendum Note (Signed)
Addended by: Anabel Halon on: 12/27/2020 10:20 AM   Modules accepted: Orders

## 2020-12-30 ENCOUNTER — Other Ambulatory Visit: Payer: Self-pay

## 2020-12-30 ENCOUNTER — Ambulatory Visit: Payer: Medicare Other | Admitting: General Practice

## 2020-12-30 ENCOUNTER — Ambulatory Visit (HOSPITAL_BASED_OUTPATIENT_CLINIC_OR_DEPARTMENT_OTHER)
Admission: RE | Admit: 2020-12-30 | Discharge: 2020-12-30 | Disposition: A | Discharge status: Home / Self Care | Payer: Medicare Other | Source: Ambulatory Visit | Attending: Medical | Admitting: Medical

## 2020-12-30 DIAGNOSIS — I6529 Occlusion and stenosis of unspecified carotid artery: Secondary | ICD-10-CM | POA: Diagnosis present

## 2020-12-30 DIAGNOSIS — R42 Dizziness and giddiness: Secondary | ICD-10-CM | POA: Insufficient documentation

## 2020-12-30 DIAGNOSIS — E041 Nontoxic single thyroid nodule: Secondary | ICD-10-CM | POA: Diagnosis present

## 2021-01-13 ENCOUNTER — Telehealth: Payer: Self-pay | Admitting: Medical

## 2021-01-13 NOTE — Telephone Encounter (Signed)
Spoke w/ Lars Mage at [facility]- Pt having blurred vision, states this has been ongoing for awhile, she informed he needed to go to ED but he declined.

## 2021-01-13 NOTE — Telephone Encounter (Signed)
Pt states he is still having eye problems, new medication is not helping blurred vision. He was transferred to triage to make sure he does not need immediate care.

## 2021-01-13 NOTE — Telephone Encounter (Signed)
Nurse Assessment Nurse: Burnadette Peter, RN, Elmo Putt Date/Time Eilene Ghazi Time): 01/13/2021 2:25:32 PM Confirm and document reason for call. If symptomatic, describe symptoms. ---Caller was transferred from office. Caller states he is having double vision and is feeling off balance. Caller states that the feeling off balance has been going on for years. Double vision happened 2 years ago but its back now. It started a couple months ago. He know his thyroid is not doing well. He started Levothyroxine on 12/27/2020. Caller states he is on the lowest dosage. He is also taking eye drops for dry eyes. Caller states that he went to the eye doctor and was told that his eyes were dry so he takes eye drops for the dryness and the doctor couldn't find the reason for the double vision. Caller was feeling weak but is now feeling better. Denying any other symptoms at the moment. Does the patient have any new or worsening symptoms? ---Yes Will a triage be completed? ---Yes Related visit to physician within the last 2 weeks? ---No Does the PT have any chronic conditions? (i.e. diabetes, asthma, this includes High risk factors for pregnancy, etc.) ---Yes List chronic conditions. ---Thyroid issue, double vision, A-fib Is this a behavioral health or substance abuse call? ---No PLEASE NOTE: All timestamps contained within this report are represented as [place] Standard Time. CONFIDENTIALTY NOTICE: This fax transmission is intended only for the addressee. It contains information that is legally privileged, confidential or otherwise protected from use or disclosure. If you are not the intended recipient, you are strictly prohibited from reviewing, disclosing, copying using or disseminating any of this information or taking any action in reliance on or regarding this information. If you have received this fax in error, please notify us immediately by telephone so that we can arrange for its return to [place]. Phone:  [phone], Toll-Free: [phone], Fax: [phone] Page: 2 of 2 Call Id: [id number] Guidelines Guideline Title Affirmed Question Affirmed Notes Nurse Date/Time Eilene Ghazi Time) Dizziness - Lightheadedness Loss of vision or double vision (Exception: similar to previous migraines) Burnadette Peter, RN, Elmo Putt 68/34/1964 2:29:79 PM Disp. Time Eilene Ghazi Time) Disposition Final User 01/13/2021 2:41:46 PM Go to ED Now (or PCP triage) Yes Burnadette Peter, RN, Laren Everts Disagree/Comply Disagree Caller Understands Yes PreDisposition InappropriateToAsk Care Advice Given Per Guideline GO TO ED NOW (OR PCP TRIAGE): * IF NO PCP (PRIMARY CARE PROVIDER) SECOND-LEVEL TRIAGE: You need to be seen within the next hour. Go to the [facility] at _____________ [place] as soon as you can. ANOTHER ADULT SHOULD DRIVE: * It is better and safer if another adult drives instead of you. CARE ADVICE given per Dizziness (Adult) guideline. Comments User: Braxton Feathers, RN Date/Time Eilene Ghazi Time): 01/13/2021 2:35:52 PM Caller states he is on a blood thinner because of the A-fib. User: Braxton Feathers, RN Date/Time Eilene Ghazi Time): 01/13/2021 2:43:12 PM Patient declined PCP Triage/ED outcome. Called office backline to inform of patient declining the triage outcome. Spoke with Fara Chute and she is going to refer this information to the patient's doctor. Referrals GO TO FACILITY REFUSED

## 2021-01-14 ENCOUNTER — Other Ambulatory Visit (HOSPITAL_BASED_OUTPATIENT_CLINIC_OR_DEPARTMENT_OTHER): Payer: Self-pay | Admitting: Family

## 2021-01-14 DIAGNOSIS — I272 Pulmonary hypertension, unspecified: Secondary | ICD-10-CM

## 2021-01-14 DIAGNOSIS — R0609 Other forms of dyspnea: Secondary | ICD-10-CM

## 2021-01-20 ENCOUNTER — Encounter: Payer: Self-pay | Admitting: Medical

## 2021-01-22 ENCOUNTER — Ambulatory Visit (INDEPENDENT_AMBULATORY_CARE_PROVIDER_SITE_OTHER): Payer: Medicare Other | Admitting: Medical

## 2021-01-22 ENCOUNTER — Encounter: Payer: Self-pay | Admitting: Medical

## 2021-01-22 ENCOUNTER — Other Ambulatory Visit: Payer: Self-pay

## 2021-01-22 VITALS — BP 140/80 | HR 75 | Temp 97.8°F | Resp 16 | Ht 75.0 in | Wt 293.2 lb

## 2021-01-22 DIAGNOSIS — I4821 Permanent atrial fibrillation: Secondary | ICD-10-CM | POA: Diagnosis not present

## 2021-01-22 DIAGNOSIS — R7989 Other specified abnormal findings of blood chemistry: Secondary | ICD-10-CM

## 2021-01-22 DIAGNOSIS — I10 Essential (primary) hypertension: Secondary | ICD-10-CM | POA: Diagnosis not present

## 2021-01-22 DIAGNOSIS — E785 Hyperlipidemia, unspecified: Secondary | ICD-10-CM

## 2021-01-22 DIAGNOSIS — H532 Diplopia: Secondary | ICD-10-CM

## 2021-01-22 DIAGNOSIS — R42 Dizziness and giddiness: Secondary | ICD-10-CM

## 2021-01-22 NOTE — Progress Notes (Signed)
Subjective:    Patient ID: Michael Whitney, male    DOB: 18-Apr-1950, 70 y.o.   MRN: [id number]  HPI  Pt in for history of years of dizziness and double vision. He has been seen by various specialist in past by ent,  PT, neurologist and ophthalmologist.   Pt had follow up by ophthalmologist tomorrow but he cancelled that exam. Pt states he did not see need for the follow up.  He will call back to reschedule. Reviewed with pt today Eye MD A/P from his last visit. Discussed with pt in detail.  Pt in the past has been convinced that thyroid is the cause. His last labs.tsh mild high, low t4 decreased and increased thyroid peroxidase. I started low dose levothyroxine.  Pt states blurred vision and double vision seems to be getting little more worse than baseline historically.   Hx of htn. He is losartan 50  mg daily. He cut down from 100 mg at instruction of the cardiologist. But today he started back on 100 mg daily.   Pt lives alone. They live in [place]. He thinks in future may need to move if dizziness and blurred vision persist.  At night he states symptoms subside.  Referral to neurologist Dr. Tomi Likens pending. Pt in the past has also seen neurologist with [facility]. I placed referral to Dr. Tomi Likens but scheduled for summer.    Review of Systems  Constitutional:  Negative for chills, fatigue and fever.       Feels more energy since starting thyroid med.  Eyes:  Positive for visual disturbance. Negative for photophobia, pain, discharge, redness and itching.  Respiratory:  Negative for cough, chest tightness, shortness of breath and wheezing.   Cardiovascular:  Negative for chest pain and palpitations.  Gastrointestinal:  Negative for abdominal pain.  Genitourinary:  Negative for dysuria, enuresis, frequency, hematuria, penile pain, scrotal swelling and urgency.  Musculoskeletal:  Negative for back pain and joint swelling.  Skin:  Negative for rash.  Neurological:  Positive for  dizziness. Negative for seizures, syncope, speech difficulty, weakness and light-headedness.  Psychiatric/Behavioral:  Negative for behavioral problems and confusion.    Past Medical History:  Diagnosis Date   Atrial fibrillation ([place])    Cataract    Diabetes mellitus without complication ([place])    Hypertension    Seizures ([place])      Social History   Socioeconomic History   Marital status: Widowed    Spouse name: Not on file   Number of children: 2   Years of education: Not on file   Highest education level: Not on file  Occupational History   Occupation: retired    Comment: Geophysicist/field seismologist  Tobacco Use   Smoking status: Never   Smokeless tobacco: Never  Vaping Use   Vaping Use: Never used  Substance and Sexual Activity   Alcohol use: Yes    Alcohol/week: 2.0 standard drinks    Types: 1 Shots of liquor, 1 Cans of beer per week    Comment: occasionally   Drug use: No   Sexual activity: Not on file  Other Topics Concern   Not on file  Social History Narrative   Not on file   Social Determinants of Health   Financial Resource Strain: Not on file  Food Insecurity: Not on file  Transportation Needs: Not on file  Physical Activity: Not on file  Stress: Not on file  Social Connections: Not on file  Intimate Partner Violence: Not on file  Past Surgical History:  Procedure Laterality Date   CATARACT EXTRACTION     PVC ABLATION N/A 07/14/2017   Procedure: PVC ABLATION;  Surgeon: Evans Lance, MD;  Location: [facility];  Service: Cardiovascular;  Laterality: N/A;   RIGHT/LEFT HEART CATH AND CORONARY ANGIOGRAPHY N/A 10/24/2020   Procedure: RIGHT/LEFT HEART CATH AND CORONARY ANGIOGRAPHY;  Surgeon: [name], Peter M, MD;  Location: [facility];  Service: Cardiovascular;  Laterality: N/A;   TONSILLECTOMY     VARICOSE VEIN SURGERY      Family History  Problem Relation Age of Onset   CAD Mother        MI at age 8    No Active Allergies  Current  Outpatient Medications on File Prior to Visit  Medication Sig Dispense Refill   Accu-Chek FastClix Lancets MISC Use 1 lancet daily to test blood sugar (E11.9)     acetaminophen (TYLENOL) 500 MG tablet Take 1,000 mg by mouth every 6 (six) hours as needed for moderate pain or headache.     Artificial Tear Ointment (DRY EYES OP) Place 1 drop into both eyes daily.     azelastine (ASTELIN) 0.1 % nasal spray Place 1 spray into both nostrils 2 (two) times daily as needed for rhinitis. Use in each nostril as directed 30 mL 12   Cholecalciferol (VITAMIN D-3) 25 MCG (1000 UT) CAPS Take 1,000 Units by mouth daily.     COVID-19 mRNA bivalent vaccine, Pfizer, injection Inject into the muscle. 0.3 mL 0   furosemide (LASIX) 20 MG tablet TAKE 1 TABLET(20 MG) BY MOUTH DAILY 30 tablet 2   levothyroxine (SYNTHROID) 25 MCG tablet Take 1 tablet (25 mcg total) by mouth daily. 30 tablet 3   losartan (COZAAR) 100 MG tablet Take 50 mg by mouth daily. 90 tablet 3   Multiple Vitamin (MULTI-VITAMIN) tablet Take 1 tablet by mouth daily.     Omega-3 Fatty Acids (FISH OIL) 1000 MG CAPS Take 1,000 mg by mouth daily.     OVER THE COUNTER MEDICATION Take 6 drops by mouth daily. Vitamins A, D, and K     triamcinolone cream (KENALOG) 0.1 % Apply 1 application topically 3 (three) times a week.     vitamin B-12 (CYANOCOBALAMIN) 1000 MCG tablet Take 1,000 mcg by mouth daily.     Vitamin D, Ergocalciferol, (DRISDOL) 1.25 MG (50000 UNIT) CAPS capsule Take 50,000 Units by mouth every Tuesday.     XARELTO 20 MG TABS tablet TAKE 1 TABLET(20 MG) BY MOUTH DAILY WITH SUPPER (Patient taking differently: Take 20 mg by mouth daily.) 90 tablet 1   No current facility-administered medications on file prior to visit.    BP (!) 158/71 (BP Location: Right Arm, Patient Position: Sitting, Cuff Size: Large)   Pulse 75   Temp 97.8 F (36.6 C) (Oral)   Resp 16   Ht 6\' 3"  (1.905 m)   Wt 293 lb 3.2 oz (133 kg)   SpO2 96%   BMI 36.65 kg/m        Objective:   Physical Exam   General Mental Status- Alert. General Appearance- Not in acute distress.   Skin General: Color- Normal Color. Moisture- Normal Moisture.  Neck Carotid Arteries- Normal color. Moisture- Normal Moisture. No carotid bruits. No JVD.  Chest and Lung Exam Auscultation: Breath Sounds:-Normal.  Cardiovascular Auscultation:Rythm- Regular. Murmurs & Other Heart Sounds:Auscultation of the heart reveals- No Murmurs.  Abdomen Inspection:-Inspeection Normal. Palpation/Percussion:Note:No mass. Palpation and Percussion of the abdomen reveal- Non  Tender, Non Distended + BS, no rebound or guarding.   Neurologic Cranial Nerve exam:- CN III-XII intact(No nystagmus), symmetric smile. Strength:- 5/5 equal and symmetric strength both upper and lower extremities.      Assessment & Plan:   Patient Instructions  Hypertension-blood pressure decently controlled today at 140/80.  Recommend continue losartan 100 mg daily.  Recommend that you check your blood pressure twice a day 5 minutes apart.  Do this for 5 consecutive days and send me a MyChart update on blood pressure readings.  On review of cardiologist note he recommended using Lasix 20 mg on as needed basis.  Recommend checking your lower extremities for pedal edema and use Lasix only as needed.  Not to use hydrochlorothiazide presently.  Atrial fibrillation with mild bradycardia.  Cardiologist note not indicate cardiac cause for patient's dizziness.  Chronic dizziness for years.  Unfortunately no definitive cause found below blood expressions have evaluated.  Currently waiting for referral to neurologist to discuss possibility of vertebrobasilar artery stenosis as cause?  Vascular surgeon was not sure if surgery would be of benefit?  With chronic dizziness have to get advised that if you have any motor or sensory deficits along with the dizziness then be seen in the emergency department.  Double vision for years and  recently worsening.  On review of ophthalmology note it appears double vision might be due to muscle weakness.  You canceled recent ophthalmology appointment.  Recommend that you call and get rescheduled.  Low T4 recently when elevated TSH and elevated thyroid peroxidase antibodies.  I placed her on low-dose thyroid medication.  Placing future thyroid levels be repeated in 1 month.  After review might increase dose of medication as indicated.  Also will try to send Dr. Lindwood Coke message to see if he can get scheduled with her.    Follow-up date with me to be determined after labs in 1 month.  As needed as well.   Time spent with patient today was 59  minutes which consisted of chart revdiew, discussing diagnosis, work up treatment and documentation.  Extensive counseling and explanation of patient's current problems as he historically has been very frustrated with lack of progress/improvement regarding chronic dizziness as well as double vision.

## 2021-01-22 NOTE — Patient Instructions (Addendum)
Hypertension-blood pressure decently controlled today at 140/80.  Recommend continue losartan 100 mg daily.  Recommend that you check your blood pressure twice a day 5 minutes apart.  Do this for 5 consecutive days and send me a MyChart update on blood pressure readings.  On review of cardiologist note he recommended using Lasix 20 mg on as needed basis.  Recommend checking your lower extremities for pedal edema and use Lasix only as needed.  Not to use hydrochlorothiazide presently.  Atrial fibrillation with mild bradycardia.  Cardiologist note not indicate cardiac cause for patient's dizziness.  Chronic dizziness for years.  Unfortunately no definitive cause found below blood expressions have evaluated.  Currently waiting for referral to neurologist to discuss possibility of vertebrobasilar artery stenosis as cause?  Vascular surgeon was not sure if surgery would be of benefit?  With chronic dizziness have to get advised that if you have any motor or sensory deficits along with the dizziness then be seen in the emergency department.  Double vision for years and recently worsening.  On review of ophthalmology note it appears double vision might be due to muscle weakness.  You canceled recent ophthalmology appointment.  Recommend that you call and get rescheduled.  Low T4 recently when elevated TSH and elevated thyroid peroxidase antibodies.  I placed her on low-dose thyroid medication.  Placing future thyroid levels be repeated in 1 month.  After review might increase dose of medication as indicated.  Also will try to send Dr. Lindwood Coke message to see if he can get scheduled with her.    Follow-up date with me to be determined after labs in 1 month.  As needed as well.

## 2021-01-30 ENCOUNTER — Telehealth: Payer: Self-pay | Admitting: Medical

## 2021-01-30 NOTE — Telephone Encounter (Signed)
Spoke with pt , notified him he should go to the ER and he stated he spoke with his friend and they stated they experience the same thing with their sinuses and it causes facial pain , and he stated he took zytrec and he stated he is feeling somewhat better and doesn't want to go to the ER because they cant find anything wrong.

## 2021-01-30 NOTE — Telephone Encounter (Signed)
Pt. Called in and stated he is feeling very dizzy with pressure on the left side of his chest. He informed me the dizziness has gotten worse and he feels as if he could fall over at any time. Transferred to triage for further advice.

## 2021-01-30 NOTE — Telephone Encounter (Signed)
Triage called our back line , she told patient to go to the ER he denied because he stated he goes and they never find anythig.

## 2021-01-30 NOTE — Telephone Encounter (Signed)
urse Assessment Nurse: Raphael Gibney, RN, Vera Date/Time (Eastern Time): 01/30/2021 12:14:52 PM Confirm and document reason for call. If symptomatic, describe symptoms. ---Caller states he gets double vision and he feels light headed and unsteady on his feet. he feels like he is going to fall over. has had symptoms since 7 am. has numbness on the left side of his face. BP 124/66 this am. Does the patient have any new or worsening symptoms? ---Yes Will a triage be completed? ---Yes Related visit to physician within the last 2 weeks? ---No Does the PT have any chronic conditions? (i.e. diabetes, asthma, this includes High risk factors for pregnancy, etc.) ---Yes List chronic conditions. ---hashimoto's disease; A fib; HTN Is this a behavioral health or substance abuse call? ---No Guidelines Guideline Title Affirmed Question Affirmed Notes Nurse Date/Time (Eastern Time) Dizziness - Lightheadedness SEVERE dizziness (e.g., unable to stand, requires support to walk, feels like passing out now) Raphael Gibney, Therapist, sports, Vanita Ingles 01/30/2021 12:21:15 PM PLEASE NOTE: All timestamps contained within this report are represented as [place] Standard Time. CONFIDENTIALTY NOTICE: This fax transmission is intended only for the addressee. It contains information that is legally privileged, confidential or otherwise protected from use or disclosure. If you are not the intended recipient, you are strictly prohibited from reviewing, disclosing, copying using or disseminating any of this information or taking any action in reliance on or regarding this information. If you have received this fax in error, please notify us immediately by telephone so that we can arrange for its return to [place]. Phone: [phone], Toll-Free: [phone], Fax: [phone] Page: 2 of 2 Call Id: [id number] [place]. Time Eilene Ghazi Time) Disposition Final User 01/30/2021 12:13:05 PM Send to Urgent Queue Milton Ferguson 01/30/2021 12:27:18 PM Go to ED  Now (or PCP triage) Yes Raphael Gibney, RN, Doreatha Lew Disagree/Comply Disagree Caller Understands Yes PreDisposition Call Doctor Care Advice Given Per Guideline GO TO ED NOW (OR PCP TRIAGE): * IF NO PCP (PRIMARY CARE PROVIDER) SECOND-LEVEL TRIAGE: You need to be seen within the next hour. Go to the [facility] at _____________ [place] as soon as you can. * It is better and safer if another adult drives instead of you. ANOTHER ADULT SHOULD DRIVE: CARE ADVICE given per Dizziness (Adult) guideline. Comments User: Dannielle Burn, RN Date/Time Eilene Ghazi Time): 01/30/2021 12:24:43 PM pt does not want to go to the ER but wants call back from the office. User: Dannielle Burn, RN Date/Time Eilene Ghazi Time): 01/30/2021 12:27:15 PM called back line and spoke to [facility] and gave report that pt is dizzy and has numbness on left side of his face. Triage outcome go to ER now but pt does not want to go. Referrals GO TO FACILITY REFUSED

## 2021-01-30 NOTE — Telephone Encounter (Signed)
Called pt and unable to leave voicemail , received busy dial tone after the phone rang a few times... trying to notify pt PCP is not in office and should go to the ER

## 2021-02-05 NOTE — Progress Notes (Addendum)
Virtual Visit via Video Note The purpose of this virtual visit is to provide medical care while limiting exposure to the novel coronavirus.    Consent was obtained for video visit:  Yes.   Answered questions that patient had about telehealth interaction:  Yes.   I discussed the limitations, risks, security and privacy concerns of performing an evaluation and management service by telemedicine. I also discussed with the patient that there may be a patient responsible charge related to this service. The patient expressed understanding and agreed to proceed.  Pt location: Home Physician Location: office Name of referring provider:  Saguier, Percell Miller, PA-C I connected with Michael Whitney at patients initiation/request on 02/06/2021 at  1:30 PM EST by video enabled telemedicine application and verified that I am speaking with the correct person using two identifiers. Pt MRN:  [id number] Pt DOB:  07/19/1950 Video Participants:  Michael Whitney  Assessment and Plan:   Dizziness - unclear etiology but given unremarkable vestibular testing, consider Persistent Postural-Perceptual Dizziness (typically this is treated with an SSRI) or secondary to other systemic disease.  I do not think that the vertebral artery stenosis is source for his dizziness or diplopia.  Dizziness has been ongoing well before the vertebral artery stenosis. Diplopia - most likely related to thyroid disease.  However, I would like to evaluate for Myasthenia gravis as well.  1   Check Myasthenia panel - AchR antibodies with MuSK reflex 2  Further recommendations pending results.  04/23/2021 ADDENDUM: Myasthenia panel including MuSK antibodies was normal.  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  At this time, I have not identified a neuromuscular disease or other primary neurologic disorder as the cause  of his symptoms.    History of Present Illness:  Michael Whitney is a 70 year old right-handed male with atrial fibrillation on Eliquis, type 2 diabetes mellitus, HTN, hyperthyroidism and history of prior alcohol abuse and alcohol-withdrawal seizures who follows up for dizziness.  UPDATE: He began having worsening dizziness, balance problems and new vertical double vision in October.  No headaches.  He was hospitalized on 12/10/2020 for these symptoms as well as NSTEMI where he had MRI of brain without contrast on 12/10/2020, which was personally reviewed and was unremarkable.  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  He saw vascular surgery who wouldn't perform surgery unless neurology felt it to be contributing to his sympotms.  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     HISTORY: He started having dizziness around 2015 when he was diagnosed with hyperthyroidism.  He describes a constant feeling of motion sickness.  He feels lightheaded and notes sense of movement when he moves his head quickly but no spinning.  Intensity does fluctuate.  It can be triggered by movement, standing up or when driving.  Sometimes some blurred vision but no double vision.  Some mild nausea.  He has some neck tightness but no significant pain.  He has had physical therapy on the neck which hasn't really helped.  Cervical spine X-ray from August 2019 showed mild degenerative changes.  He has been seen by ENT.  BPPV was ruled out.  Vestibular testing reportedly unremarkable.  He is followed by cardiology.  He has persistent atrial fibrillation.  Past  Holter monitor reportedly showed intermittent bradycardia.  Echo and stress test were unremarkable.  He has had an extensive workup with neurology at [facility] in 2020.  MRI of brain and MRA of head and neck from June 2020 sowed  moderate cerebral and cerebellar atrophy but no acute intracranial abnormality or intracranial and extracranial high-grade stenosis or occlusion.  A follow up CTA of head was normal.  In July 2020, he had an EEG to evaluate for possible cerebellar seizures, which was normal.  It was believed that his symptoms may be multifactorial, related to BPPV, intermittent bradycardia (as was documented on Holter monitor) and OSA. 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  However, symptoms became worse.     Of note, he had MRI of brain in September 2020 for left sided facial paresthesias, personally reviewed, which was negative for acute infarct.  In July 2021 he was again evaluated for left sided numbness as well as dizziness.  CTA of head and neck personally reviewed showed no hemodynamically significant intracranial and extracranial stenosis or occlusion.  No acute infarct noted.  Thyroid has since been controlled.   Past Medical History: Past Medical History:  Diagnosis Date   Atrial fibrillation ([place])    Cataract    Diabetes mellitus without complication ([place])    Hypertension    Seizures ([place])     Medications: Outpatient Encounter Medications as of 02/06/2021  Medication Sig   Accu-Chek FastClix Lancets MISC Use 1 lancet daily to test blood sugar (E11.9)   acetaminophen (TYLENOL) 500 MG tablet Take 1,000 mg by mouth every 6 (six) hours as needed for moderate pain or headache.   Artificial Tear Ointment (DRY EYES OP) Place 1 drop into both eyes daily.   azelastine (ASTELIN) 0.1 % nasal spray Place 1 spray into both nostrils 2 (two) times daily as needed for rhinitis. Use in each nostril as directed   Cholecalciferol (VITAMIN D-3) 25 MCG (1000 UT) CAPS Take 1,000 Units by mouth daily.   levothyroxine (SYNTHROID) 25 MCG tablet Take 1 tablet (25 mcg total) by mouth daily.   losartan (COZAAR) 100 MG tablet Take 100 mg by mouth daily.   Multiple  Vitamin (MULTI-VITAMIN) tablet Take 1 tablet by mouth daily.   Omega-3 Fatty Acids (FISH OIL) 1000 MG CAPS Take 1,000 mg by mouth daily.   OVER THE COUNTER MEDICATION Take 6 drops by mouth daily. Vitamins A, D, and K   triamcinolone cream (KENALOG) 0.1 % Apply 1 application topically 3 (three) times a week.   vitamin B-12 (CYANOCOBALAMIN) 1000 MCG tablet Take 1,000 mcg by mouth daily.   XARELTO 20 MG TABS tablet TAKE 1 TABLET(20 MG) BY MOUTH DAILY WITH SUPPER (Patient taking differently: Take 20 mg by mouth daily.)   [DISCONTINUED] COVID-19 mRNA bivalent vaccine, Pfizer, injection Inject into the muscle.   [DISCONTINUED] furosemide (LASIX) 20 MG tablet TAKE 1 TABLET(20 MG) BY MOUTH DAILY   [DISCONTINUED] Vitamin D, Ergocalciferol, (DRISDOL) 1.25 MG (50000 UNIT) CAPS capsule Take 50,000 Units by mouth every Tuesday.   No facility-administered encounter medications on file as of 02/06/2021.    Allergies: No Known Allergies  Family History: Family History  Problem Relation Age of Onset   CAD Mother        MI at age 11    Observations/Objective:   Height 6\' 3"  (1.905 m), weight 285 lb (129.3 kg). No acute distress.  Alert and oriented.  Speech fluent and  not dysarthric.  Language intact.     Follow Up Instructions:    -I discussed the assessment and treatment plan with the patient. The patient was provided an opportunity to ask questions and all were answered. The patient agreed with the plan and demonstrated an understanding of the instructions.   The patient was advised to call back or seek an in-person evaluation if the symptoms worsen or if the condition fails to improve as anticipated.   Dudley Major, DO

## 2021-02-06 ENCOUNTER — Other Ambulatory Visit: Payer: Self-pay

## 2021-02-06 ENCOUNTER — Telehealth (INDEPENDENT_AMBULATORY_CARE_PROVIDER_SITE_OTHER): Payer: Medicare Other | Admitting: Neurology

## 2021-02-06 ENCOUNTER — Telehealth: Payer: Self-pay | Admitting: Neurology

## 2021-02-06 ENCOUNTER — Encounter: Payer: Self-pay | Admitting: Neurology

## 2021-02-06 VITALS — Ht 75.0 in | Wt 285.0 lb

## 2021-02-06 DIAGNOSIS — H532 Diplopia: Secondary | ICD-10-CM | POA: Diagnosis not present

## 2021-02-06 DIAGNOSIS — R42 Dizziness and giddiness: Secondary | ICD-10-CM | POA: Diagnosis not present

## 2021-02-06 NOTE — Patient Instructions (Signed)
Myasthenia gravis workup

## 2021-02-06 NOTE — Telephone Encounter (Signed)
Pt called in stating he wants to do a VV for his 1:30 visit today. He is having double vision and cannot drive. Is this something Dr. Tomi Likens is ok doing a VV with?

## 2021-02-10 ENCOUNTER — Other Ambulatory Visit: Payer: Self-pay

## 2021-02-10 DIAGNOSIS — H532 Diplopia: Secondary | ICD-10-CM

## 2021-02-11 ENCOUNTER — Telehealth: Payer: Self-pay | Admitting: Neurology

## 2021-02-11 NOTE — Telephone Encounter (Signed)
Patient is going to have labs done. Thanked for me for calling, patient needs his labs done

## 2021-02-11 NOTE — Telephone Encounter (Signed)
Patient called requesting a call back.   He said he is feeling extreme weakness for the last couple of weeks.  He is not sure if the fatigue is related to his thyroid or not.  Patient is very concerned he said that is it much worse today.

## 2021-02-12 ENCOUNTER — Other Ambulatory Visit (INDEPENDENT_AMBULATORY_CARE_PROVIDER_SITE_OTHER): Payer: Medicare Other

## 2021-02-12 DIAGNOSIS — I10 Essential (primary) hypertension: Secondary | ICD-10-CM

## 2021-02-12 DIAGNOSIS — R7989 Other specified abnormal findings of blood chemistry: Secondary | ICD-10-CM | POA: Diagnosis not present

## 2021-02-12 LAB — COMPREHENSIVE METABOLIC PANEL
ALT: 28 U/L (ref 0–53)
AST: 26 U/L (ref 0–37)
Albumin: 4.3 g/dL (ref 3.5–5.2)
Alkaline Phosphatase: 44 U/L (ref 39–117)
BUN: 21 mg/dL (ref 6–23)
CO2: 28 mEq/L (ref 19–32)
Calcium: 9.8 mg/dL (ref 8.4–10.5)
Chloride: 103 mEq/L (ref 96–112)
Creatinine, Ser: 1.18 mg/dL (ref 0.40–1.50)
GFR: 61.62 mL/min (ref >60.00)
Glucose, Bld: 126 mg/dL — ABNORMAL HIGH (ref 70–99)
Potassium: 4.8 mEq/L (ref 3.5–5.1)
Sodium: 139 mEq/L (ref 135–145)
Total Bilirubin: 1 mg/dL (ref 0.2–1.2)
Total Protein: 6.8 g/dL (ref 6.0–8.3)

## 2021-02-12 LAB — T4, FREE: Free T4: 0.66 ng/dL (ref 0.60–1.60)

## 2021-02-12 LAB — TSH: TSH: 1.54 u[IU]/mL (ref 0.35–5.50)

## 2021-02-13 ENCOUNTER — Telehealth: Payer: Self-pay | Admitting: Neurology

## 2021-02-13 ENCOUNTER — Other Ambulatory Visit: Payer: Medicare Other

## 2021-02-13 LAB — THYROID PEROXIDASE ANTIBODY: Thyroperoxidase Ab SerPl-aCnc: 172 IU/mL — ABNORMAL HIGH (ref <9)

## 2021-02-13 NOTE — Telephone Encounter (Signed)
Patient advised the Myasthenia Gravis Panel 2 w/ Rflx MuSK Antibody [[id number] Custom].  Take up two weeks to be resulted.   Pt states he is still feeling weak not sure what to do.

## 2021-02-13 NOTE — Telephone Encounter (Signed)
Patient would like to get the blood work results please call

## 2021-02-14 NOTE — Telephone Encounter (Signed)
Patient has video visit with Dr.Jaffe for dizziness and double vision. Recommend that he discuss weakness/fatigue with PCP, because Dr. Tomi Likens has not seen him for this.

## 2021-02-14 NOTE — Telephone Encounter (Signed)
Pt advised of Dr. Posey Pronto note.

## 2021-02-25 ENCOUNTER — Ambulatory Visit: Payer: Medicare Other | Admitting: Internal Medicine

## 2021-02-25 ENCOUNTER — Encounter: Payer: Self-pay | Admitting: Internal Medicine

## 2021-02-25 VITALS — BP 138/82 | HR 74 | Ht 75.0 in | Wt 294.0 lb

## 2021-02-25 DIAGNOSIS — E890 Postprocedural hypothyroidism: Secondary | ICD-10-CM

## 2021-02-25 DIAGNOSIS — E063 Autoimmune thyroiditis: Secondary | ICD-10-CM | POA: Diagnosis not present

## 2021-02-25 DIAGNOSIS — H532 Diplopia: Secondary | ICD-10-CM | POA: Diagnosis not present

## 2021-02-25 NOTE — Progress Notes (Signed)
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 male with a past medical history of A. Fib, Hypothyroidism and T2DM. The patient presented for initial endocrinology clinic visit on 02/25/2021 for consultative assistance with his Hashimoto's Disease .     He was diagnosed with hyperthyroidism  in 2015 . He was evaluated by Endocrinology at [facility]. He has been on  Methimazole until 08/2020 when it appears he may have developed a rash and it was stopped. . A thyroid ultrasound 12/2019 showed worsening heterogeneity with resolution of thyroid nodules.   Per pt he has RAI ablation sometime in 2022. I am unable to find details about this .   By DTOI,7124 he developed Double vision. He was seen by ophthalmology  Dr. 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 Tomi Likens ( neurology ) for diplopia and dizziness with a working diagnosis of Myasthenia Gravis . Of note, the pt has had dizziness since 2014   During follow up with PCP in 12/2020 he was noted to have a TSH of 10 uIU/mL and Anti-TPO abs of 238 IU/mL . He was started on LT-4 replacement in 12/2020   He continues with complaints  of inability to lose weight, dry skin, hair loss , dizziness and weakness . He denies eye tearing or pruritus of the eye but endorses dryness.    Father with a goiter    HISTORY:  Past Medical History:  Past Medical History:  Diagnosis Date   Atrial fibrillation ([place])    Cataract    Diabetes mellitus without complication ([place])    Hypertension    Seizures ([place])    Past Surgical History:  Past Surgical History:  Procedure Laterality Date   CATARACT EXTRACTION     PVC ABLATION N/A 07/14/2017    Procedure: PVC ABLATION;  Surgeon: Evans Lance, MD;  Location: [place] [facility];  Service: Cardiovascular;  Laterality: N/A;   RIGHT/LEFT HEART CATH AND CORONARY ANGIOGRAPHY N/A 10/24/2020   Procedure: RIGHT/LEFT HEART CATH AND CORONARY ANGIOGRAPHY;  Surgeon: [name], Peter M, MD;  Location: [place] [facility];  Service: Cardiovascular;  Laterality: N/A;   TONSILLECTOMY     VARICOSE VEIN SURGERY      Social History:  reports that he has never smoked. He has never used smokeless tobacco. He reports current alcohol use of about 2.0 standard drinks per week. He reports that he does not use drugs. Family History: family history includes CAD in his mother.   HOME MEDICATIONS: Allergies as of 02/25/2021   No Known Allergies      Medication List        Accurate as of February 25, 2021 10:56 AM. If you have any questions, ask your nurse or doctor.          Accu-Chek FastClix Lancets Misc Use 1 lancet daily to test blood sugar (E11.9)   acetaminophen 500 MG tablet Commonly known as: TYLENOL Take 1,000 mg by mouth every 6 (six) hours as needed for moderate pain or headache.   azelastine 0.1 % nasal spray Commonly known as: ASTELIN Place 1 spray into both nostrils 2 (two)  times daily as needed for rhinitis. Use in each nostril as directed   DRY EYES OP Place 1 drop into both eyes daily.   Fish Oil 1000 MG Caps Take 1,000 mg by mouth daily.   levothyroxine 25 MCG tablet Commonly known as: SYNTHROID Take 1 tablet (25 mcg total) by mouth daily.   losartan 100 MG tablet Commonly known as: COZAAR Take 100 mg by mouth daily.   Multi-Vitamin tablet Take 1 tablet by mouth daily.   OVER THE COUNTER MEDICATION Take 6 drops by mouth daily. Vitamins A, D, and K   triamcinolone cream 0.1 % Commonly known as: KENALOG Apply 1 application topically 3 (three) times a week.   vitamin B-12 1000 MCG tablet Commonly known as: CYANOCOBALAMIN Take 1,000 mcg by mouth daily.   Vitamin  D-3 25 MCG (1000 UT) Caps Take 1,000 Units by mouth daily.   Xarelto 20 MG Tabs tablet Generic drug: rivaroxaban TAKE 1 TABLET(20 MG) BY MOUTH DAILY WITH SUPPER What changed: See the new instructions.          REVIEW OF SYSTEMS: A comprehensive ROS was conducted with the patient and is negative except as per HPI     OBJECTIVE:  VS: BP 138/82 (BP Location: Left Arm, Patient Position: Sitting, Cuff Size: Small)    Pulse 74    Ht 6\' 3"  (1.905 m)    Wt 294 lb (133.4 kg)    SpO2 95%    BMI 36.75 kg/m    Wt Readings from Last 3 Encounters:  02/25/21 294 lb (133.4 kg)  02/06/21 285 lb (129.3 kg)  01/22/21 293 lb 3.2 oz (133 kg)     EXAM: General: Pt appears well and is in NAD  Eyes: External eye exam normal without stare, lid lag or exophthalmos.  EOM intact. Diplopia in the horizontal pane   Neck: General: Supple without adenopathy. Thyroid: Thyroid size normal.  No goiter or nodules appreciated  Lungs: Clear with good BS bilat with no rales, rhonchi, or wheezes  Heart: Auscultation: RRR.  Extremities:  BL LE: No pretibial edema normal ROM and strength.  Mental Status: Judgment, insight: Intact Orientation: Oriented to time, place, and person Mood and affect: No depression, anxiety, or agitation     DATA REVIEWED:     Latest Reference Range & Units 02/12/21 10:35  TSH 0.35 - 5.50 uIU/mL 1.54  T4,Free(Direct) 0.60 - 1.60 ng/dL 0.66  Thyroperoxidase Ab SerPl-aCnc <9 IU/mL 172 (H)     Thyroid Ultrasound 02/12/2021  Markedly heterogeneous shrunken thyroid without discrete nodule.    ASSESSMENT/PLAN/RECOMMENDATIONS:   Hashimoto's Disease:  - He has had a hx of hyperthyroidism since 2015 and has been on Methimazole until 08/2020  - Per pt he has had RAI ablation in 2022- details not available  - Pt educated extensively on the correct way to take levothyroxine (first thing in the morning with water, 30 minutes before eating or taking other medications). - Pt  encouraged to double dose the following day if she were to miss a dose given long half-life of levothyroxine.   Medications : Continue Levothyroxine 25 mcg daily   2. Diplopia :  - Pt is follow up with Dr. Tomi Likens for a working diagnosis of MG . - He continues to follow with ophthalmology as well  - Will check TRAb and TSI for Grave's Disease.   Labs in 8 weeks  F/U in 4 months   Signed electronically by: Mack Guise, MD  [facility] Endocrinology  [facility] [address] [place], [place] [address] Phone: [phone] FAX: [phone]   CC: Elise Benne [address] [place] [address] Phone: [phone] Fax: [phone]   Return to Endocrinology clinic as below: Future Appointments  Date Time Provider [place]  03/20/2021  2:00 PM Freddi Starr, MD [facility] None  04/18/2021  1:20 PM [name], Peter M, MD [facility]

## 2021-02-26 ENCOUNTER — Telehealth: Payer: Self-pay | Admitting: Neurology

## 2021-02-26 NOTE — Telephone Encounter (Signed)
Pt called in stating he did some lab work about 2 weeks ago and it was supposed to have been sent to the [facility]. He is wanting to see if the [facility] has made any decisions. He says he is still very weak.

## 2021-02-27 ENCOUNTER — Other Ambulatory Visit: Payer: Self-pay

## 2021-02-27 NOTE — Telephone Encounter (Signed)
Telephone call to patient, and lab to see what happened to the labs. Per Kieth Brightly in the lab she checked with [facility] clinic and they never received the lab or paperwork. She will call back again to make sure before we have the patient come back to have the labs drawn.   Per Patient he had his labs drawn at his office visit at [facility] office.

## 2021-02-28 ENCOUNTER — Telehealth: Payer: Self-pay | Admitting: Internal Medicine

## 2021-02-28 ENCOUNTER — Telehealth: Payer: Self-pay

## 2021-02-28 ENCOUNTER — Telehealth: Payer: Self-pay | Admitting: Medical

## 2021-02-28 LAB — TRAB (TSH RECEPTOR BINDING ANTIBODY): TRAB: 11.21 IU/L — ABNORMAL HIGH (ref <=2.00)

## 2021-02-28 LAB — THYROID STIMULATING IMMUNOGLOBULIN: TSI: 495 % baseline — ABNORMAL HIGH (ref <140)

## 2021-02-28 NOTE — Telephone Encounter (Signed)
Spoke to patient. Went to lab on 21st for myasthenia panel and it was apparently never sent, having it done on Monday. He spoke to his PCP office, he is feeling really, really weak, to the point it is hard to get up and walk. This has been going on for a while, last couple of weeks getting worse, today has to be really careful, using a cane to get to bathroom. Whole body. Having the double vision, a little better today. No problems swallowing, breathing. Just diffuse body weakness, he has been to the hospital for the exact same symptoms but they "could not find anything" in the hospital so "there is no sense in going there." He states he knows it has to be something with the thyroid, saw endo last week. He says he is getting mixed signals all over the place if he does or does not have Hashimoto's, but he did have radiation tablet for his thyroid.   Advised that since this has happened before, can hold off on going to ER, but if symptoms continue to worsen, would go call EMS. Agree with PCP follow-up to discuss concerns regarding mixed signals. He is asking if bloodwork can be ordered as stat instead of 2 week turnaround, discussed that we have no control over this but can request it. He is asking if he needs to see Dr. Tomi Likens next week, will forward to Dr. Cristy Friedlander but at this point would proceed with bloodwork and if any further recommendations next week from Dr. Tomi Likens, office will contact him.

## 2021-02-28 NOTE — Telephone Encounter (Signed)
Pt called on Triage line stating that he was very weak. I gave him the message that he needed to go to the ER. He says he has been to the ER about 3-4 times last year for this and he was told that they knew nothing about thyroid disease. He says he is almost sure the thyroid disease is the cause of the weakness. I advised maybe he should speak with Endo since she manages the conditions- he said "good luck". He says he would have better luck speaking with Dr Charlene Brooke about the issue. He has called their office as well and he suggested that he be seen in the ED as well- pt is aware.   So I did let him know that I would pass his concerns to you and that maybe you could communicate with the other providers and we would call back with a plan.  CB number: [phone].

## 2021-02-28 NOTE — Telephone Encounter (Signed)
Pt called in and stated that he is feeling so weak he could pass out. Transferred to triage for further advice.

## 2021-02-28 NOTE — Telephone Encounter (Signed)
Nurse Assessment Nurse: Alvis Lemmings, RN, Marcie Bal Date/Time Eilene Ghazi Time): 02/28/2021 11:38:29 AM Confirm and document reason for call. If symptomatic, describe symptoms. ---Caller is feeling very weak for the last 2 weeks, it has gotten much worse today. His BP and Blood sugar are both normal. He is also having diarrhea since yesterday.Also the lab lost a sample of his and he hasn't heard anything about what is going on. Dec 21st blood test to check for Myasthenia Gravis. Was to go to [facility]. Lab at [facility] said no sample received. Weakness at least a year. Worse last 2 weeks. Does the patient have any new or worsening symptoms? ---Yes Will a triage be completed? ---Yes Related visit to physician within the last 2 weeks? ---No Does the PT have any chronic conditions? (i.e. diabetes, asthma, this includes High risk factors for pregnancy, etc.) ---Yes List chronic conditions. ---diabetes, HTN, thyroid, Is this a behavioral health or substance abuse call? ---No Guidelines Guideline Title Affirmed Question Affirmed Notes Nurse Date/Time (Eastern Time) Weakness (Generalized) and Fatigue [1] MODERATE weakness (i.e., interferes with work, Museum/gallery curator, Therapist, sports, Marcie Bal 02/28/2021 11:43:21 AM PLEASE NOTE: All timestamps contained within this report are represented as [place] Standard Time. CONFIDENTIALTY NOTICE: This fax transmission is intended only for the addressee. It contains information that is legally privileged, confidential or otherwise protected from use or disclosure. If you are not the intended recipient, you are strictly prohibited from reviewing, disclosing, copying using or disseminating any of this information or taking any action in reliance on or regarding this information. If you have received this fax in error, please notify us immediately by telephone so that we can arrange for its return to [place]. Phone: [phone], Toll-Free: [phone], Fax: [phone] Page: 2 of 2 Call Id:  [id number] Guidelines Guideline Title Affirmed Question Affirmed Notes Nurse Date/Time Eilene Ghazi Time) school, normal activities) AND [2] cause unknown (Exceptions: weakness with acute minor illness, or weakness from poor fluid intake) Disp. Time Eilene Ghazi Time) Disposition Final User 02/28/2021 11:56:41 AM See HCP within 4 Hours (or PCP triage) Yes Alvis Lemmings, RN, Lenon Oms Disagree/Comply Comply Caller Understands Yes PreDisposition Call Doctor Care Advice Given Per Guideline SEE HCP (OR PCP TRIAGE) WITHIN 4 HOURS: * IF OFFICE WILL BE OPEN: You need to be seen within the next 3 or 4 hours. Call your doctor (or NP/PA) now or as soon as the office opens. CARE ADVICE given per Weakness and Fatigue (Adult) guideline. BRING MEDICINES: * Please bring a list of your current medicines when you go to see the doctor. CALL BACK IF: * You become worse Comments User: Manning Charity, RN Date/Time Eilene Ghazi Time): 02/28/2021 11:54:22 AM afib and on blood thinners User: Manning Charity, RN Date/Time Eilene Ghazi Time): 02/28/2021 11:58:57 AM backline called Kristine Garbe, Referrals Warm transfer to backline

## 2021-02-28 NOTE — Telephone Encounter (Signed)
Called pt notified him to go to the ER , he stated he feels really weak and its hard to stand up and he feels like he is going to pass out and he stated he will go to the ER .Marland KitchenMarland Kitchen

## 2021-02-28 NOTE — Telephone Encounter (Signed)
Spoke to the pt on 02/28/2021 at 1600 Discussed elevated TSI 495 %    I explained to the pt he has Hashitoxi but his graves' disease of the thyroid has been treated with RAI ablation, this does not mean that Graves' disease would not affect the eye    Pt with contact his ophthalmologist and let them know   His blood samples to MG have been lost   Pt to continue on Levothyroxine    He is on a small dose and does not explain his weakness as he was told that weakness could be caused by levothyroxine    Abby Nena Jordan, MD  [facility] Endocrinology  [facility] [address] [place], [place] [address] Phone: [phone] FAX: [phone]

## 2021-02-28 NOTE — Telephone Encounter (Signed)
Pt is scheduled to see Dr.Wendling on Monday, also front desk staff advised pt ED if sx got worse

## 2021-02-28 NOTE — Telephone Encounter (Signed)
Per pt he been feeling weak since his las visit. But now it has gotten to the point he feels like he weill pass out.  Advised pt if he gotten that bad please go to the ED. Pt want a Provider to give him a call.   Per Kieth Brightly in the lab, She will call the patient to have him come back and get his Mysentia Gravis labs redrawn for send out.

## 2021-02-28 NOTE — Telephone Encounter (Signed)
*  EMS came to patient's house he didn't go to ED

## 2021-02-28 NOTE — Telephone Encounter (Signed)
Pt wanted to leave a message regarding his trip to the ed. He said they ran an ekg along with some other tests he was unsure of the names, and there was no sign of a heart attack or stroke. They did tell him it could be a side effect of his new thyroid medication and will need to be checked out by pcp as soon as possible. Please advise.

## 2021-02-28 NOTE — Telephone Encounter (Signed)
Patient said he needs to know what to do regarding labs. Does he need to go back? He stated he never got a call back

## 2021-03-03 ENCOUNTER — Telehealth: Payer: Self-pay | Admitting: Neurology

## 2021-03-03 ENCOUNTER — Encounter: Payer: Self-pay | Admitting: Neurology

## 2021-03-03 ENCOUNTER — Ambulatory Visit (INDEPENDENT_AMBULATORY_CARE_PROVIDER_SITE_OTHER): Payer: Medicare Other | Admitting: Family Medicine

## 2021-03-03 ENCOUNTER — Encounter: Payer: Self-pay | Admitting: Family Medicine

## 2021-03-03 ENCOUNTER — Other Ambulatory Visit: Payer: Medicare Other

## 2021-03-03 ENCOUNTER — Other Ambulatory Visit: Payer: Self-pay

## 2021-03-03 VITALS — BP 128/80 | HR 93 | Temp 98.0°F | Ht 75.0 in | Wt 294.0 lb

## 2021-03-03 DIAGNOSIS — R531 Weakness: Secondary | ICD-10-CM | POA: Diagnosis not present

## 2021-03-03 DIAGNOSIS — I4821 Permanent atrial fibrillation: Secondary | ICD-10-CM

## 2021-03-03 MED ORDER — RIVAROXABAN 20 MG PO TABS: 20.0000 mg | ORAL_TABLET | Freq: Every day | ORAL | 2 refills | Status: DC

## 2021-03-03 NOTE — Telephone Encounter (Signed)
Please patient back about

## 2021-03-03 NOTE — Progress Notes (Signed)
Chief Complaint  Patient presents with   Weakness    Subjective: Patient is a 71 y.o. male here for weakness.  Patient is a chronic history of weakness.  He has been diagnosed with both Graves' disease and Hashimoto's thyroiditis.  He saw the neurology team who has myasthenia gravis on the differential.  He had labs drawn, in part for a MG panel, on 12/21 but something happened where his neurology team never received a blood sample so they consented to the [facility].  That panel is not complete.  He is wondering if he needed blood drawn here again to send over to low-power neurology so they can forwarded to the [facility].  Patient has a history of atrial fibrillation on Xarelto 20 mg daily.  He is compliant with the medication with no adverse effects.  He is wondering if there is any alternative/generic to this.  It cost him a little over $100 per month on average for this medication.  Past Medical History:  Diagnosis Date   Atrial fibrillation ([place])    Cataract    Diabetes mellitus without complication ([place])    Hypertension    Seizures ([place])    Thought to be related to something his GF gave him    Objective: BP 128/80    Pulse 93    Temp 98 F (36.7 C) (Oral)    Ht 6\' 3"  (1.905 m)    Wt 294 lb (133.4 kg)    SpO2 99%    BMI 36.75 kg/m  General: Awake, appears stated age Lungs: No accessory muscle use Psych: Age appropriate judgment and insight, normal affect and mood  Assessment and Plan: Weakness - Plan: Myasthenia Gravis Profile  Permanent atrial fibrillation ([place])  Unfortunately this is my first time seeing the patient and I do not have intermittent knowledge of his prior work-up.  After discussing his situation with our team, it was deemed appropriate that we could draw his blood today and send it to the neurology team.  He will be in touch with their office to ensure they received his sample.  It is difficult for him to travel so he would prefer not to go to their office  if at all possible.  He also asked me if I would prescribe medication for myasthenia gravis.  I told him he should discuss this with Dr. Tomi Likens of the neurology team.  He will plan to reach out. We also briefly discussed his atrial fibrillation anticoagulation treatment with Xarelto.  It looks like he was prescribed a brand-name so I will send in generic to see if it is cheaper.  I told him to keep his expectations low. He will follow-up with his regular PCP as originally scheduled. The patient voiced understanding and agreement to the plan.  I spent 35 minutes with the patient discussing the above plan in addition to reviewing his chart on the same day of the visit.  [place], DO 03/03/21  9:12 AM

## 2021-03-03 NOTE — Addendum Note (Signed)
Addended by: Kelle Darting A on: 03/03/2021 09:31 AM   Modules accepted: Orders

## 2021-03-03 NOTE — Telephone Encounter (Signed)
Patient is feeling bad, no worse than usually, last 3 weeks getting worse. I advised if worsen to go to ER. Any suggestions from Dr. Tomi Likens per him. He had labs redone this am.

## 2021-03-03 NOTE — Telephone Encounter (Signed)
Patient said he doesn't know what to do. He lives alone, and very weak. Can barely drive. He doesn't know if he should start medication. He was hoping to get results, but they lost his blood.

## 2021-03-03 NOTE — Patient Instructions (Addendum)
You should follow up with Dr. Tomi Likens to discuss empirically starting treatment for possible myasthenia gravis. Also follow up with them regarding the blood work being sent to his office.   Let us know if you need anything.

## 2021-03-03 NOTE — Telephone Encounter (Signed)
Patient is calling regarding his blood sample that was lost.

## 2021-03-05 NOTE — Telephone Encounter (Signed)
Patient stated he never recvd a call. I let him know he spoke with christy on 03/03/21 and she said if you were to get worse go to ER. He said he is getting worse, and can barely sit up or talk. I told him I would let the nurse know.

## 2021-03-05 NOTE — Telephone Encounter (Signed)
Advised pt that his labs are still waiting on results.    Per last note Pt to go the ED. Pt called 911 last week, His blood sugar levels.   Please what he should do.

## 2021-03-10 ENCOUNTER — Telehealth: Payer: Self-pay | Admitting: Neurology

## 2021-03-10 DIAGNOSIS — G7000 Myasthenia gravis without (acute) exacerbation: Secondary | ICD-10-CM

## 2021-03-10 NOTE — Addendum Note (Signed)
Addended by: [place] Night on: 03/10/2021 04:22 PM   Modules accepted: Orders

## 2021-03-10 NOTE — Addendum Note (Signed)
Addended by: [place]ia Night on: 03/10/2021 03:32 PM   Modules accepted: Orders

## 2021-03-10 NOTE — Telephone Encounter (Signed)
Myasthenia gravis antibody is negative.  Still waiting for another antibody test but I would like to go ahead and order a nerve conduction study with repetitive stimulation to further test for myasthenia gravis - he will receive little shocks to measure his nerves and muscles

## 2021-03-12 NOTE — Telephone Encounter (Signed)
Pt called an informed myasthenia labs were negative but are still proceeding with the EMG, EMG will be done in our office, pt asking If he will see Dr Tomi Likens as well, Pt advised NO he will only see Dr Posey Pronto. pt stated at he is weak again pt advised that if it gets worse to go to the hospital, pt stated that he also has a rash pt advised to call his PCP so they can look at the rash and treat it,

## 2021-03-12 NOTE — Telephone Encounter (Signed)
Patient called stating he was wanting to get his [facility] results.

## 2021-03-13 NOTE — Telephone Encounter (Signed)
OPEN IN ERROR 

## 2021-03-19 ENCOUNTER — Telehealth: Payer: Self-pay | Admitting: Cardiology

## 2021-03-19 ENCOUNTER — Telehealth: Payer: Self-pay | Admitting: Internal Medicine

## 2021-03-19 NOTE — Telephone Encounter (Signed)
STAT if patient feels like he/she is going to faint   Are you dizzy now? yes  Do you feel faint or have you passed out? yes  Do you have any other symptoms? Very weak   Have you checked your HR and BP (record if available)? Yes...Marland Kitchen 158/69, 160/98

## 2021-03-19 NOTE — Telephone Encounter (Signed)
error 

## 2021-03-19 NOTE — Telephone Encounter (Signed)
Called patient, he states that his BP has been all over the place, his BP today after he got up to take his dogs out, he noticed he got dizzy and then began to check his BP this time it was 180/90. He states this has been going on for a while now, but he wanted to give a call to have Dr.Jordan review. He states then he sat down, rechecked and it was 158/90, and then rechecked again it was 160/92. He states it has been running in the 160-140's and this is concerning to him. We did discuss not checking the BP so often as this could cause anxiety and increased BP. He states the symptoms he has is being weak, dizziness, and stomach upset. Patient states he is taking the whole 100 mg losartan tablet, and no other medication changes. We did discuss if the BP continues to increase, and any stroke symptoms he is to call 911 for EMS to evaluate. Patient verbalized understanding, however I would route a message to MD to review.   I attempted to get an appointment- but no APP's have openings for a few weeks, and nothing sooner with Dr.Jordan. Appointment is already scheduled with Dr.Jordan on 04/18/2021.   Will route to MD/NURSE

## 2021-03-20 ENCOUNTER — Other Ambulatory Visit: Payer: Self-pay

## 2021-03-20 ENCOUNTER — Encounter: Payer: Self-pay | Admitting: Pulmonary Disease

## 2021-03-20 ENCOUNTER — Ambulatory Visit: Payer: Medicare Other | Admitting: Pulmonary Disease

## 2021-03-20 VITALS — BP 130/84 | HR 70 | Ht 75.0 in | Wt 297.2 lb

## 2021-03-20 DIAGNOSIS — R0602 Shortness of breath: Secondary | ICD-10-CM

## 2021-03-20 DIAGNOSIS — I272 Pulmonary hypertension, unspecified: Secondary | ICD-10-CM

## 2021-03-20 DIAGNOSIS — Z6835 Body mass index (BMI) 35.0-35.9, adult: Secondary | ICD-10-CM

## 2021-03-20 MED ORDER — AMLODIPINE BESYLATE 2.5 MG PO TABS: 2.5000 mg | ORAL_TABLET | Freq: Every day | ORAL | 3 refills | Status: DC

## 2021-03-20 NOTE — Progress Notes (Signed)
Synopsis: Referred in January 2023 for shortness of breath by Mackie Pai, PA  Subjective:   PATIENT ID: Michael Whitney GENDER: male DOB: Mar 02, 1950, MRN: [id number]  HPI  Chief Complaint  Patient presents with   Consult    Referred by PCP for SOB. States this has been going on for the past few years.    Michael Whitney is a 71 year old male, never smoker with atrial fibrillation, thyroid disease, OSA on cpapc, DMII and hypertension who is referred to pulmonary clinic for shortness of breath.   He reports developing shortness of breath over the past year or so and mainly notices it with exertion. He denies wheezing, cough or chest tightness. He also complains of feeling faint, experiencing dizziness, and the sensation that he is floating forward/backward when sitting or standing over the past 8 years. He is being evaluated by endocrinology for thyroid issues and is being evaluated by neurology and ophthalmology for the above complaints.   He has gained 50lbs over recent years. He is using cpap nightly but has not had this followed up since he started using it 5 years ago. He smoked a pipe and a pack of cigarettes per week for 10-20 years. He was exposed to second hand smoke in childhood. No history of DVT/PE. He worked in a [employer], systems for a period of time where he was exposed to various smokes.   He is on xarelto for atrial fibrillation. He had RHC and LHC 10/24/20 which showed normal cardiac output, non-obstructive CAD, PA mean 62mmHg, PW mean 36mmHg. TTE 12/12/20 showed LV EF 64-33%, RV systolic function mildly reduced with normal RV size. LA severely dilated. RA moderately dilated. Normal mitral valve. Severe calcification of aortic valve with moderate aortic stenosis.  Past Medical History:  Diagnosis Date   Atrial fibrillation ([place])    Cataract    Diabetes mellitus without complication ([place])    Hypertension    Seizures ([place])    Thought to be related to something his GF  gave him     Family History  Problem Relation Age of Onset   CAD Mother        MI at age 70     Social History   Socioeconomic History   Marital status: Widowed    Spouse name: Not on file   Number of children: 2   Years of education: Not on file   Highest education level: Not on file  Occupational History   Occupation: retired    Comment: Geophysicist/field seismologist  Tobacco Use   Smoking status: Never   Smokeless tobacco: Never  Vaping Use   Vaping Use: Never used  Substance and Sexual Activity   Alcohol use: Yes    Alcohol/week: 2.0 standard drinks    Types: 1 Shots of liquor, 1 Cans of beer per week    Comment: occasionally   Drug use: No   Sexual activity: Not on file  Other Topics Concern   Not on file  Social History Narrative   Not on file   Social Determinants of Health   Financial Resource Strain: Not on file  Food Insecurity: Not on file  Transportation Needs: Not on file  Physical Activity: Not on file  Stress: Not on file  Social Connections: Not on file  Intimate Partner Violence: Not on file     No Known Allergies   Outpatient Medications Prior to Visit  Medication Sig Dispense Refill   Accu-Chek FastClix Lancets MISC Use 1 lancet  daily to test blood sugar (E11.9)     acetaminophen (TYLENOL) 500 MG tablet Take 1,000 mg by mouth every 6 (six) hours as needed for moderate pain or headache.     amLODipine (NORVASC) 2.5 MG tablet Take 1 tablet (2.5 mg total) by mouth daily. 90 tablet 3   Artificial Tear Ointment (DRY EYES OP) Place 1 drop into both eyes daily.     azelastine (ASTELIN) 0.1 % nasal spray Place 1 spray into both nostrils 2 (two) times daily as needed for rhinitis. Use in each nostril as directed 30 mL 12   Cholecalciferol (VITAMIN D-3) 25 MCG (1000 UT) CAPS Take 1,000 Units by mouth daily.     levothyroxine (SYNTHROID) 25 MCG tablet Take 1 tablet (25 mcg total) by mouth daily. 30 tablet 3   losartan (COZAAR) 100 MG tablet Take 100 mg by mouth  daily. 90 tablet 3   Multiple Vitamin (MULTI-VITAMIN) tablet Take 1 tablet by mouth daily.     Omega-3 Fatty Acids (FISH OIL) 1000 MG CAPS Take 1,000 mg by mouth daily.     OVER THE COUNTER MEDICATION Take 6 drops by mouth daily. Vitamins A, D, and K     rivaroxaban (XARELTO) 20 MG TABS tablet Take 1 tablet (20 mg total) by mouth daily with supper. 90 tablet 2   triamcinolone cream (KENALOG) 0.1 % Apply 1 application topically 3 (three) times a week.     vitamin B-12 (CYANOCOBALAMIN) 1000 MCG tablet Take 1,000 mcg by mouth daily.     No facility-administered medications prior to visit.   Review of Systems  Constitutional:  Negative for chills, fever, malaise/fatigue and weight loss.  HENT:  Negative for congestion, sinus pain and sore throat.   Eyes:  Positive for double vision.  Respiratory:  Positive for shortness of breath. Negative for cough, hemoptysis, sputum production and wheezing.   Cardiovascular:  Negative for chest pain, palpitations, orthopnea, claudication and leg swelling.  Gastrointestinal:  Negative for abdominal pain, heartburn, nausea and vomiting.  Genitourinary: Negative.   Musculoskeletal:  Negative for joint pain and myalgias.  Skin:  Negative for rash.  Neurological:  Positive for dizziness. Negative for weakness.  Endo/Heme/Allergies: Negative.   Psychiatric/Behavioral: Negative.     Objective:   Vitals:   03/20/21 1413  BP: 130/84  Pulse: 70  SpO2: 97%  Weight: 297 lb 3.2 oz (134.8 kg)  Height: 6\' 3"  (1.905 m)     Physical Exam Constitutional:      General: He is not in acute distress.    Appearance: He is obese.  HENT:     Head: Normocephalic and atraumatic.  Eyes:     Extraocular Movements: Extraocular movements intact.     Conjunctiva/sclera: Conjunctivae normal.     Pupils: Pupils are equal, round, and reactive to light.  Cardiovascular:     Rate and Rhythm: Normal rate and regular rhythm.     Pulses: Normal pulses.     Heart sounds:  Normal heart sounds. No murmur heard. Pulmonary:     Effort: Pulmonary effort is normal.     Breath sounds: Normal breath sounds.  Abdominal:     General: Bowel sounds are normal.     Palpations: Abdomen is soft.  Musculoskeletal:     Right lower leg: No edema.     Left lower leg: No edema.  Lymphadenopathy:     Cervical: No cervical adenopathy.  Skin:    General: Skin is warm and dry.  Neurological:  General: No focal deficit present.     Mental Status: He is alert.  Psychiatric:        Mood and Affect: Mood normal.        Behavior: Behavior normal.        Thought Content: Thought content normal.        Judgment: Judgment normal.    CBC    Component Value Date/Time   WBC 6.4 12/24/2020 1743   RBC 4.87 12/24/2020 1743   HGB 16.4 12/24/2020 1743   HGB 16.3 10/18/2020 0930   HCT 49.2 12/24/2020 1743   HCT 48.4 10/18/2020 0930   PLT 140.0 (L) 12/24/2020 1743   PLT 144 (L) 10/18/2020 0930   MCV 101.0 (H) 12/24/2020 1743   MCV 97 10/18/2020 0930   MCH 33.1 12/11/2020 1610   MCHC 33.3 12/24/2020 1743   RDW 14.3 12/24/2020 1743   RDW 12.6 10/18/2020 0930   LYMPHSABS 1.7 12/24/2020 1743   LYMPHSABS 1.7 07/07/2017 1149   MONOABS 0.5 12/24/2020 1743   EOSABS 0.2 12/24/2020 1743   EOSABS 0.1 07/07/2017 1149   BASOSABS 0.1 12/24/2020 1743   BASOSABS 0.0 07/07/2017 1149   BMP Latest Ref Rng & Units 02/12/2021 12/24/2020 12/12/2020  Glucose 70 - 99 mg/dL 126(H) 112(H) 111(H)  BUN 6 - 23 mg/dL 21 27(H) 23  Creatinine 0.40 - 1.50 mg/dL 1.18 1.33 1.36(H)  BUN/Creat Ratio 10 - 24 - - -  Sodium 135 - 145 mEq/L 139 141 137  Potassium 3.5 - 5.1 mEq/L 4.8 4.9 5.0  Chloride 96 - 112 mEq/L 103 101 103  CO2 19 - 32 mEq/L 28 29 27   Calcium 8.4 - 10.5 mg/dL 9.8 9.5 9.2   Chest imaging: CXR 07/21/18 Normal cardiac silhouette ectatic aorta. Band of atelectasis in the lingula. No focal consolidation. No pleural fluid. No pneumothorax. Degenerative osteophytosis of the  spine.  PFT: No flowsheet data found.  Labs:  Path:  Echo 12/12/20: LV EF 50-55%. Mild LVH. RV systolic function is mildly reduced. RV size is normal.   Heart Catheterization 10/24/20:   Ost LM to Mid LM lesion is 30% stenosed.   Prox LAD lesion is 30% stenosed.   Mid Cx lesion is 45% stenosed.   Prox RCA to Mid RCA lesion is 30% stenosed.   The left ventricular systolic function is normal.   LV end diastolic pressure is mildly elevated.   The left ventricular ejection fraction is 55-65% by visual estimate.   Hemodynamic findings consistent with mild pulmonary hypertension.   There is no aortic valve stenosis.   There is trivial (1+) mitral regurgitation.   Nonobstructive CAD Minimally elevated left heart filling pressures.  Mild pulmonary HTN. Normal cardiac output.  Flowsheet Row Most Recent Value  Fick Cardiac Output 5.1 L/min  Fick Cardiac Output Index 2 (L/min)/BSA  RA A Wave 11 mmHg  RA V Wave 13 mmHg  RA Mean 11 mmHg  RV Systolic Pressure 36 mmHg  RV Diastolic Pressure 7 mmHg  RV EDP 12 mmHg  PA Systolic Pressure 35 mmHg  PA Diastolic Pressure 21 mmHg  PA Mean 27 mmHg  PW A Wave 20 mmHg  PW V Wave 22 mmHg  PW Mean 20 mmHg  AO Systolic Pressure 409 mmHg  AO Diastolic Pressure 72 mmHg  AO Mean 90 mmHg  LV Systolic Pressure 811 mmHg  LV Diastolic Pressure 9 mmHg  LV EDP 16 mmHg  AOp Systolic Pressure 914 mmHg  AOp Diastolic Pressure 75 mmHg  AOp  Mean Pressure 355 mmHg  LVp Systolic Pressure 732 mmHg  LVp Diastolic Pressure 10 mmHg  LVp EDP Pressure 19 mmHg  QP/QS 1  TPVR Index 13.49 HRUI  TSVR Index 44.97 HRUI  PVR SVR Ratio 0.09  TPVR/TSVR Ratio 0.3      Assessment & Plan:   Shortness of breath  Pulmonary hypertension (HCC)  Severe obesity (BMI 35.0-35.9 with comorbidity) (HCC)  Discussion: Roshun Klingensmith is a 71 year old male, never smoker with atrial fibrillation, thyroid disease, OSA on cpapc, DMII and hypertension who is referred to  pulmonary clinic for shortness of breath.  His shortness of breath appears multifactorial at this time given his weight gain, significant decline in physical activity due to his vision and dizziness symptoms, underlying heart disease, mild pulmonary hypertension, and possible issues with his thyroid levels.   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 He may have pulmonary hypertension due to his aortic valve disease vs heart failure with preserved EF.   He has history of sleep apnea and risk factors for obstructive lung disease which could contribute to group 3 disease for his pulmonary hypertension.   We will have him follow up with one of our sleep specialists for further evaluation of his current cpap settings/treatment.  Will have him follow up in 4-6 weeks for pulmonary function tests.  Freda Jackson, MD [facility] Office: [phone]    Current Outpatient Medications:    Accu-Chek FastClix Lancets MISC, Use 1 lancet daily to test blood sugar (E11.9), Disp: , Rfl:    acetaminophen (TYLENOL) 500 MG tablet, Take 1,000 mg by mouth every 6 (six) hours as needed for moderate pain or headache., Disp: , Rfl:    amLODipine (NORVASC) 2.5 MG tablet, Take 1 tablet (2.5 mg total) by mouth daily., Disp: 90 tablet, Rfl: 3   Artificial Tear Ointment (DRY EYES OP), Place 1 drop into both eyes daily., Disp: , Rfl:    azelastine (ASTELIN) 0.1 % nasal spray, Place 1 spray into both nostrils 2 (two) times daily as needed for rhinitis. Use in each nostril as directed, Disp: 30 mL, Rfl: 12   Cholecalciferol (VITAMIN D-3) 25 MCG (1000 UT) CAPS, Take 1,000 Units by mouth daily., Disp: , Rfl:    levothyroxine (SYNTHROID) 25 MCG tablet, Take 1 tablet (25 mcg total) by mouth daily., Disp: 30 tablet, Rfl: 3   losartan (COZAAR) 100 MG tablet, Take 100 mg by mouth daily., Disp: 90 tablet, Rfl: 3    Multiple Vitamin (MULTI-VITAMIN) tablet, Take 1 tablet by mouth daily., Disp: , Rfl:    Omega-3 Fatty Acids (FISH OIL) 1000 MG CAPS, Take 1,000 mg by mouth daily., Disp: , Rfl:    OVER THE COUNTER MEDICATION, Take 6 drops by mouth daily. Vitamins A, D, and K, Disp: , Rfl:    rivaroxaban (XARELTO) 20 MG TABS tablet, Take 1 tablet (20 mg total) by mouth daily with supper., Disp: 90 tablet, Rfl: 2   triamcinolone cream (KENALOG) 0.1 %, Apply 1 application topically 3 (three) times a week., Disp: , Rfl:    vitamin B-12 (CYANOCOBALAMIN) 1000 MCG tablet, Take 1,000 mcg by mouth daily., Disp: , Rfl:

## 2021-03-20 NOTE — Telephone Encounter (Signed)
Spoke to patient Dr.Jordan advised to start Amlodipine 2.5 mg daily.Continue to monitor B/P daily and bring readings to 04/18/21 appointment.Patient stated he would like to be referred to someone to review his medications to see what is causing him to be weak and dizzy at times.Stated some days are worse than other days.Advised Dr.Jordan will be in office tomorrow.I will ask him.

## 2021-03-20 NOTE — Telephone Encounter (Signed)
He has periodic elevation of BP. Has chronic dizziness and weakness that is not related to BP. It would be reasonable to start on amlodipine 2.5 mg daily for better BP control. Keep appt. In Feb  Quan Cybulski [name] MD, [facility]

## 2021-03-20 NOTE — Patient Instructions (Signed)
We will check pulmonary function tests in the next 4-6 weeks to evaluate your shortness of breath.   We will schedule you for a visit with one of our sleep specialists for follow up of your sleep apnea.

## 2021-03-21 NOTE — Telephone Encounter (Signed)
Spoke to patient Dr.Jordan advised to see Pharmacist to review medications.Scheduler will call back with appointment.

## 2021-03-25 ENCOUNTER — Telehealth: Payer: Self-pay | Admitting: Cardiology

## 2021-03-25 NOTE — Telephone Encounter (Signed)
Pt c/o medication issue:  1. Name of Medication: rivaroxaban (XARELTO) 20 MG TABS tablet and losartan (COZAAR) 100 MG tablet  2. How are you currently taking this medication (dosage and times per day)? As directed  3. Are you having a reaction (difficulty breathing--STAT)? Patient not sure if the medication's are causing his dizziness and weakness  4. What is your medication issue? Called patient to schedule an appt with PharmD per staff message from Jordan Likes, Dr. Doug Sou nurse. Dr. [name] wanted the patient to see PharmD to discuss medication's that may be causing his dizziness and weakness. Patient states that he does not think it is the medications and is not coming to the office to just talk to someone. He stated that he also started a new thyroid medication last month but these symptoms have been going on long before that. He also wanted me to relay to Dr. [name] that he did yoga yesterday for about 30-40 minutes and got so exhausted he could barely lift his arms. He states that he used to be able to exercise for hours and only being able to workout 30-43minutes is causing him to worry. Please advise.

## 2021-03-25 NOTE — Telephone Encounter (Signed)
Spoke to patient he stated someone already called him and scheduled appointment with Dr.Jordan 2/24 at 1:20 pm.Stated he does not think medications are causing his weakness.He will discuss at his visit.

## 2021-03-25 NOTE — Telephone Encounter (Signed)
Appointment scheduled with pharmacist to review medications 04/25/21 at 10:30 am.

## 2021-04-11 ENCOUNTER — Other Ambulatory Visit: Payer: Self-pay | Admitting: Medical

## 2021-04-13 NOTE — Progress Notes (Unsigned)
Office Visit    Patient Name: Michael Whitney Date of Encounter: 04/13/2021  PCP:  Saguier, [name], [place]  Cardiologist:  Christe Tellez [name], MD  Advanced Practice Provider:  No care team member to display Electrophysiologist:  Cristopher Peru, MD     Chief Complaint    Michael Whitney is a 71 y.o. 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), OSA presents today for follow-up after ED visit  Past Medical History    Past Medical History:  Diagnosis Date   Atrial fibrillation ([place])    Cataract    Diabetes mellitus without complication ([place])    Hypertension    Seizures ([place])    Thought to be related to something his GF gave him   Past Surgical History:  Procedure Laterality Date   CATARACT EXTRACTION     PVC ABLATION N/A 07/14/2017   Procedure: PVC ABLATION;  Surgeon: Evans Lance, MD;  Location: [facility];  Service: Cardiovascular;  Laterality: N/A;   RIGHT/LEFT HEART CATH AND CORONARY ANGIOGRAPHY N/A 10/24/2020   Procedure: RIGHT/LEFT HEART CATH AND CORONARY ANGIOGRAPHY;  Surgeon: [name], Sharnese Heath M, MD;  Location: [facility];  Service: Cardiovascular;  Laterality: N/A;   TONSILLECTOMY     VARICOSE VEIN SURGERY     Allergies  No Known Allergies  History of Present Illness    Michael Whitney is a 71 y.o. 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 [name].  Diagnosed with atrial fibrillation in 2019.  EP study by Dr. Lovena Le revealing PVCs arising near His bundle and ablation was not recommended due to potential for heart block.  He has been maintained on a rate control therapy with  anticoagulation.  He has longstanding history of chronic dizziness.  Has been unrelated to changes in blood pressure or heart rate.  Has been thought to be due to vertigo and has followed with neurology.  He has completed some element of physical therapy with minimal improvement.  ED visit at [facility] 10/01/2020.  He presented with feeling weak, dyspnea on exertion, chest pain, dizziness, lower extremity edema.  Troponin slightly elevated, BNP slightly elevated. TTE performed with bilateral atria severely dilated, LVEF 55 to 60%, RV moderately to severely dilated, RVSF moderately reduced, mild to moderate MR, moderate TR, mild to moderate pulmonary hypertension.  He was recommended for diuresis.  He was also started on Protonix 40 mg twice daily for 30 days and then decrease to once daily after 1 month at follow-up with PCP.  His hydrochlorothiazide was increased to 25 mg daily.  Imdur 30 mg initiated.  Discharged on 7 day course of Lasix. He was recommended for consideration of right heart cath as an outpatient.  Seen 10/16/20 noting dizziness which was persistent. He was concerned about weight gain and wondered if thyroid contributory. He endorsed pain as well as dyspnea on exertion was set up for right and left cardiac catheterization.  Underwent right and left cardiac catheterization 10/24/2020 showing nonobstructive coronary artery disease, minimally elevated left heart filling pressures, mild pulmonary hypertension, normal cardiac output.  He was on appropriate diuretic therapy and noted to have some component of diastolic heart failure.    A PYP scan to  evaluate for possible amyloid could be considered.   Patient presented to the ED on 12/11/2020 for further evaluation of chronic dizziness which had recently worsened. He also reported blurred/double vision. He denied any loss of vision, headache, or trouble swallowing. He does have some balance issues though. He did reported an episode of  substernal chest tightness that lasted for a few hours a few weeks prior to admission but no recurrent chest pain since then.    In the ED, EKG showed atrial fibrillation with no acute ischemic changes. High-sensitivity troponin 251 >> 232 >> 216. Trend felt to be c/w demand ischemia. CBC was completely normal. Na 137, K 5.0, Glucose 111, BUN 23, Cr 1.36. Head/neck CTA showed severe stenosis of the right vertebral artery origin, approximately 50% stenosis of the mid common carotid artery, and a small (10mm) outpouching arising from the supraclinoid right ICA (compatible with aneurysm or infudibulum) but no large vessel occlusion or proximal hemodynamically significant stenosis in the head. Brain MRI showed no acute findings. He was started on statin therapy. Seen by vascular surgery who felt his vertebral stensosis was not causing his dizziness. Follow up with Neuro and Endocrinology recommended. Echo was unchanged.   EKGs/Labs/Other Studies Reviewed:   The following studies were reviewed today:  Echo 10/02/20 The left ventricular size is normal. There is severe concentric left  ventricular hypertrophy. The left ventricular wall motion is normal.  Left ventricular systolic function is normal. LV ejection fraction =  55-60%.  Left ventricular filling pattern is pseudonormal. LAP is  indeterminate.  The right ventricle is moderate to severely dilated. The right  ventricular systolic function is moderately reduced.  The atria are severely dilated.  There is mild to moderate mitral regurgitation.  There is moderate tricuspid regurgitation.  Mild to moderate pulmonary hypertension.  Estimated right ventricular systolic pressure is 46 mmHg.  The IVC is dilated with an abnormal collapsibility index, this  suggestive of increased right atrial pressure.   Progression of RV changes and atrial dilation compared to 2020 study.      RIGHT/LEFT HEART CATH AND CORONARY ANGIOGRAPHY    Conclusion        Ost LM to Mid LM lesion is 30% stenosed.   Prox LAD lesion is 30% stenosed.   Mid Cx lesion is 45% stenosed.   Prox RCA to Mid RCA lesion is 30% stenosed.   The left ventricular systolic function is normal.   LV end diastolic pressure is mildly elevated.   The left ventricular ejection fraction is 55-65% by visual estimate.   Hemodynamic findings consistent with mild pulmonary hypertension.   There is no aortic valve stenosis.   There is trivial (1+) mitral regurgitation.   Nonobstructive CAD Minimally elevated left heart filling pressures.  Mild pulmonary HTN. Normal cardiac output.   Plan: patient is on appropriate diuretic therapy (considering he was hydrated prior to cath). Would consider evaluating possible pulmonary cause of his dyspnea. He does have some component of diastolic CHF. May consider PYP scan to evaluate for possible amyloid       Echo 12/12/20:IMPRESSIONS     1. Left ventricular ejection fraction, by estimation, is 50 to 55%. The  left ventricle has low normal function. The left ventricle has no regional  wall motion abnormalities. There is mild left ventricular hypertrophy.  Left ventricular diastolic function   could not be evaluated.   2. Right ventricular systolic function is mildly reduced. The right  ventricular size is normal.   3.  Left atrial size was severely dilated.   4. Right atrial size was moderately dilated.   5. The mitral valve is normal in structure. No evidence of mitral valve  regurgitation. No evidence of mitral stenosis.   6. The aortic valve is tricuspid. There is severe calcifcation of the  aortic valve. Aortic valve regurgitation is trivial. The aortic valve is  moderately to severely calcified with markedly reduced cusp excursion. By  Doppler parameters stensosis is  likely moderate but visually appears more severe.   7. The inferior vena cava is dilated in size with >50% respiratory  variability, suggesting right atrial pressure of  8 mmHg.      EKG:  EKG is ordered today.  The ekg ordered today demonstrates rate controlled atrial fibrillation 63bpm with stable RBBB.   Recent Labs: 12/24/2020: Hemoglobin 16.4; Platelets 140.0 02/12/2021: ALT 28; BUN 21; Creatinine, Ser 1.18; Potassium 4.8; Sodium 139; TSH 1.54  Recent Lipid Panel    Component Value Date/Time   CHOL 226 (H) 11/29/2020 1034   TRIG 156.0 (H) 11/29/2020 1034   HDL 41.00 11/29/2020 1034   CHOLHDL 6 11/29/2020 1034   VLDL 31.2 11/29/2020 1034   LDLCALC 153 (H) 11/29/2020 1034    Home Medications   No outpatient medications have been marked as taking for the 04/18/21 encounter (Appointment) with [name], Ladona Rosten M, MD.     Review of Systems    All other systems reviewed and are otherwise negative except as noted above.  Physical Exam    VS:  There were no vitals taken for this visit. , BMI There is no height or weight on file to calculate BMI.  Wt Readings from Last 3 Encounters:  03/20/21 297 lb 3.2 oz (134.8 kg)  03/03/21 294 lb (133.4 kg)  02/25/21 294 lb (133.4 kg)     GEN: Well nourished, well developed, in no acute distress. HEENT: normal. Neck: Supple, no JVD, carotid bruits, or masses. Cardiac: irregularly irregular, no murmurs, rubs, or gallops. No clubbing, cyanosis, edema.  Radials/PT 2+ and equal bilaterally.  Respiratory:  Respirations regular and unlabored, clear to auscultation bilaterally. GI: Soft, nontender, nondistended. MS: No deformity or atrophy. Skin: Warm and dry, no rash. Neuro:  Strength and sensation are intact. Psych: Normal affect.  Assessment & Plan    Non-Obstructive CAD Recent cath in 10/2020 showed mild non-obstructive CAD. High-sensitivity troponin 251 >> 232 >> 216. EKG shows no acute ischemic changes. Echo showed LVEF of 50-55%.  No regional wall motion abnormality. No additional ischemic evaluation felt to be needed given recent reassuring cardiac catheterization. Suspect troponin elevation either  demand ischemia or chronically elevated due to metabolic issues. Continue high-intensity statin given CAD and vertebral artery stenosis. No Aspirin due to need for DOAC and no beta blocker due to baseline bradycardia.   Chronic Diastolic CHF Echo showed LV function of 50 to 55%.  Unable to evaluate diastolic dysfunction.  Mildly reduced RV function. Euvolemic during admission. Patient has Lasix 20mg  daily to take PRN.    Permanent Atrial Fibrillation  No AV nodal agents due to baseline bradycardia. Do not think his bradycardia is the cause of her dizziness. on Xarelto.    Hypertension  Continue Losartan.   Hyperlipidemia Now on high dose statin. Needs repeat labs in 3 months.   Type 2 Diabetes Mellitus  Hemoglobin A1c 6.7 on 11/29/2020. Not on any medications at home. Will need to follow-up with PCP.   Chronic Dizziness Patient has chronic dizziness but reports it has  been worse lately. Also reported double vision. Brain MRI showed no acute findings. 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 Have consulted Vascular Surgery to see if this could be the cause of his dizziness. He was seen by Dr. Stanford Breed who did not think his symptoms were from his vertebral artery stenosis. Needs follow up with Neuro and Endocrinology    Hypothyroidism Hashimoto Thyroiditis Patient has a history of hypothyroidism/Hashimoto; He states he was previously on Methimazole but broke out into a rash with this. Apparently had RAI therapy in the past. TSH normal at 1.55 and free T4 only slightly low at 0.55 on 11/29/2020. He is wondering if his Hashimoto's could be causing his chronic dizziness.    Aortic stenosis Echocardiogram this admission showed severe calcifcation of the  aortic valve. Aortic valve regurgitation is trivial. The aortic valve is  moderately to severely calcified with markedly reduced cusp excursion. By Doppler parameters stensosis is  likely moderate There was no evidence of  aortic stenosis on recent cardiac catheterization. Will monitor for now.  Signed, Tae Robak [name], MD 04/13/2021, 9:58 AM [place]

## 2021-04-18 ENCOUNTER — Ambulatory Visit: Payer: Medicare Other | Admitting: Cardiology

## 2021-04-22 ENCOUNTER — Other Ambulatory Visit: Payer: Self-pay | Admitting: Pulmonary Disease

## 2021-04-22 ENCOUNTER — Other Ambulatory Visit: Payer: Self-pay

## 2021-04-22 ENCOUNTER — Ambulatory Visit: Payer: Medicare Other | Admitting: Neurology

## 2021-04-22 DIAGNOSIS — G7000 Myasthenia gravis without (acute) exacerbation: Secondary | ICD-10-CM | POA: Diagnosis not present

## 2021-04-22 DIAGNOSIS — I272 Pulmonary hypertension, unspecified: Secondary | ICD-10-CM

## 2021-04-22 DIAGNOSIS — G629 Polyneuropathy, unspecified: Secondary | ICD-10-CM

## 2021-04-22 NOTE — Progress Notes (Signed)
Pft   

## 2021-04-22 NOTE — Procedures (Signed)
[facility]  [address], [address]  [place], [place] [address] Tel: [phone] Fax:  [phone] Test Date:  04/22/2021  Patient: Michael Whitney DOB: 1951-01-31 Physician: Narda Amber, DO  Sex: Male Height: 6\' 3"  Ref Phys: Metta Clines, D.O.  ID#: [id number]   Technician:    Patient Complaints: This is a 71 year old man with generalized weakness and double vision referred for further evaluation of myasthenia gravis.  NCV & EMG Findings: Extensive electrodiagnostic testing of the left upper and lower extremity shows:  Left median sensory response is within normal limits.  Left sural sensory response is absent. Left median motor response is within normal limits.  Left peroneal motor response is absent at the extensor digitorum brevis, and normal at the tibialis anterior. Repetitive nerve stimulation of the median, spinal accessory, and peroneal nerves and recording at the abductor pollicis brevis, trapezius, and tibialis anterior, respectively, is normal.  Specifically, there is no evidence of abnormal decrement. There is no evidence of active or chronic motor axonal loss changes affecting any of the tested muscles.  Motor unit configuration and recruitment pattern is within normal limits.  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 There is no evidence of a neuromuscular junction disorder or myopathy.   ___________________________ Narda Amber, DO    Nerve Conduction Studies Anti Sensory Summary Table   Stim Site NR Peak (ms) Norm Peak (ms) P-T Amp (V) Norm P-T Amp  Left Median Anti Sensory (2nd Digit)  32C  Wrist    3.4 <3.8 13.4 >10  Left Sural Anti Sensory (Lat Mall)  32C  Calf NR  <4.6  >3   Motor Summary Table   Stim Site NR Onset (ms) Norm Onset (ms) O-P Amp (mV) Norm O-P Amp Site1 Site2 Delta-0 (ms) Dist (cm) Vel (m/s) Norm Vel (m/s)  Left Median Motor (Abd Poll Brev)  32C  Wrist     2.7 <4.0 11.1 >5 Elbow Wrist 6.0 32.0 53 >50  Elbow    8.7  10.0         Left Peroneal Motor (Ext Dig Brev)  32C  Ankle NR  <6.0  >2.5 B Fib Ankle  0.0  >40  B Fib NR     Poplt B Fib  0.0  >40  Poplt NR            Left Peroneal TA Motor (Tib Ant)  32C  Fib Head    3.0 <4.5 3.4 >3 Poplit Fib Head 1.9 10.0 53 >40  Poplit    4.9  3.1          EMG   Side Muscle Ins Act Fibs Psw Fasc Number Recrt Dur Dur. Amp Amp. Poly Poly. Comment  Left 1stDorInt Nml Nml Nml Nml Nml Nml Nml Nml Nml Nml Nml Nml N/A  Left PronatorTeres Nml Nml Nml Nml Nml Nml Nml Nml Nml Nml Nml Nml N/A  Left Biceps Nml Nml Nml Nml Nml Nml Nml Nml Nml Nml Nml Nml N/A  Left Triceps Nml Nml Nml Nml Nml Nml Nml Nml Nml Nml Nml Nml N/A  Left AntTibialis Nml Nml Nml Nml Nml Nml Nml Nml Nml Nml Nml Nml N/A  Left Gastroc Nml Nml Nml Nml Nml Nml Nml Nml Nml Nml Nml Nml N/A  Left RectFemoris Nml Nml Nml Nml Nml Nml Nml Nml Nml Nml Nml Nml N/A   RNS   Trial # Label Amp 1 (mV)  O-P Amp 5 (  mV)  O-P Amp % Dif Area 1 (mVms) Area 5 (mVms) Area % Dif Rep Rate Train Length Pause Time (min:sec) Comments  Left Abd Poll Brev  Tr 1 Baseline 11.08 10.90 -1.6 30.46 28.48 -6.5 3.00 10 00:30   Tr 2 Post Exercise 11.16 11.14 -0.2 28.63 27.22 -4.9 3.00 10 01:00   Tr 3 1 Min Post 11.10 10.97 -1.2 29.10 27.23 -6.4 3.00 10 01:00   Tr 4 2 Min Post 11.17 10.89 -2.5 29.31 27.33 -6.7 3.00 10 01:00   Tr 5 3 Min Post 11.05 10.93 -1.1 29.05 27.60 -5.0 3.00 10 00:00   Left AntTibialis  Tr 1 Baseline 3.27 3.13 -4.4 18.16 17.05 -6.1 3.00 10 00:30   Tr 2 Post Exercise 3.21 3.19 -0.5 17.72 16.95 -4.3 3.00 10 01:00   Tr 3 1 Min Post 3.29 3.28 -0.2 18.02 18.08 0.3 3.00 10 01:00   Tr 4 2 Min Post 3.17 3.15 -0.7 17.48 17.15 -1.9 3.00 10 01:00   Tr 5 3 Min Post 3.19 3.13 -2.0 17.61 17.08 -3.0 3.00 10 00:00   Left Trapezius  Tr 1 Baseline 3.26 3.32 1.9 24.67 23.14 -6.2 3.00 10 00:30   Tr 2 Post Exercise 3.29 3.36 2.1 23.52 22.76 -3.2 3.00 10 01:00   Tr 3 1  Min Post 3.27 3.38 3.1 22.58 22.17 -1.8 3.00 10 01:00   Tr 4 2 Min Post 3.38 3.31 -2.2 23.31 21.32 -8.5 3.00 10 01:00   Tr 5 3 Min Post 3.43 3.32 -3.3 23.65 21.67 -8.4 3.00 10 00:00         Waveforms:

## 2021-04-23 ENCOUNTER — Ambulatory Visit (INDEPENDENT_AMBULATORY_CARE_PROVIDER_SITE_OTHER): Payer: Medicare Other | Admitting: Pulmonary Disease

## 2021-04-23 ENCOUNTER — Other Ambulatory Visit: Payer: Self-pay | Admitting: Medical

## 2021-04-23 ENCOUNTER — Encounter: Payer: Self-pay | Admitting: Pulmonary Disease

## 2021-04-23 ENCOUNTER — Ambulatory Visit: Payer: Medicare Other | Admitting: Pulmonary Disease

## 2021-04-23 VITALS — BP 124/68 | HR 68 | Ht 75.0 in | Wt 295.2 lb

## 2021-04-23 DIAGNOSIS — I272 Pulmonary hypertension, unspecified: Secondary | ICD-10-CM | POA: Diagnosis not present

## 2021-04-23 DIAGNOSIS — R942 Abnormal results of pulmonary function studies: Secondary | ICD-10-CM | POA: Diagnosis not present

## 2021-04-23 DIAGNOSIS — R0602 Shortness of breath: Secondary | ICD-10-CM

## 2021-04-23 DIAGNOSIS — Z6835 Body mass index (BMI) 35.0-35.9, adult: Secondary | ICD-10-CM

## 2021-04-23 LAB — PULMONARY FUNCTION TEST
DL/VA % pred: 112 %
DL/VA: 4.42 ml/min/mmHg/L
DLCO cor % pred: 89 %
DLCO cor: 26.63 ml/min/mmHg
DLCO unc % pred: 93 %
DLCO unc: 27.76 ml/min/mmHg
FEF 25-75 Post: 2.25 L/sec
FEF 25-75 Pre: 1.58 L/sec
FEF2575-%Change-Post: 42 %
FEF2575-%Pred-Post: 78 %
FEF2575-%Pred-Pre: 55 %
FEV1-%Change-Post: 10 %
FEV1-%Pred-Post: 62 %
FEV1-%Pred-Pre: 56 %
FEV1-Post: 2.4 L
FEV1-Pre: 2.17 L
FEV1FVC-%Change-Post: 3 %
FEV1FVC-%Pred-Pre: 98 %
FEV6-%Change-Post: 8 %
FEV6-%Pred-Post: 64 %
FEV6-%Pred-Pre: 59 %
FEV6-Post: 3.2 L
FEV6-Pre: 2.94 L
FEV6FVC-%Change-Post: 0 %
FEV6FVC-%Pred-Post: 104 %
FEV6FVC-%Pred-Pre: 103 %
FVC-%Change-Post: 7 %
FVC-%Pred-Post: 61 %
FVC-%Pred-Pre: 57 %
FVC-Post: 3.22 L
FVC-Pre: 3.01 L
Post FEV1/FVC ratio: 74 %
Post FEV6/FVC ratio: 99 %
Pre FEV1/FVC ratio: 72 %
Pre FEV6/FVC Ratio: 98 %
RV % pred: 124 %
RV: 3.45 L
TLC % pred: 83 %
TLC: 6.74 L

## 2021-04-23 MED ORDER — SPIRIVA RESPIMAT 2.5 MCG/ACT IN AERS: 2.0000 | INHALATION_SPRAY | Freq: Every day | RESPIRATORY_TRACT | 0 refills | Status: DC

## 2021-04-23 NOTE — Progress Notes (Signed)
PFT done today. 

## 2021-04-23 NOTE — Progress Notes (Signed)
Synopsis: Referred in January 2023 for shortness of breath by Mackie Pai, PA  Subjective:   PATIENT ID: Michael Whitney GENDER: male DOB: 02-May-1950, MRN: [id number]  HPI  Chief Complaint  Patient presents with   Follow-up    1 mo f/u after PFT. States his breathing has been stable since last visit.    Michael Whitney is a 71 year old male, never smoker with atrial fibrillation, thyroid disease, OSA on cpap, DMII and hypertension who is referred to pulmonary clinic for shortness of breath.   PFTs today show non-specific pattern with reduced FEV1 and FVC but normal FEV1/FVC, TLC and DLCO.   Continues to have issues with weight gain, double vision and dizziness.   OV 03/20/21 He reports developing shortness of breath over the past year or so and mainly notices it with exertion. He denies wheezing, cough or chest tightness. He also complains of feeling faint, experiencing dizziness, and the sensation that he is floating forward/backward when sitting or standing over the past 8 years. He is being evaluated by endocrinology for thyroid issues and is being evaluated by neurology and ophthalmology for the above complaints.   He has gained 50lbs over recent years. He is using cpap nightly but has not had this followed up since he started using it 5 years ago. He smoked a pipe and a pack of cigarettes per week for 10-20 years. He was exposed to second hand smoke in childhood. No history of DVT/PE. He worked in a Program[employer], systems for a period of time where he was exposed to various smokes.   He is on xarelto for atrial fibrillation. He had RHC and LHC 10/24/20 which showed normal cardiac output, non-obstructive CAD, PA mean 29mmHg, PW mean 35mmHg. TTE 12/12/20 showed LV EF 35-59%, RV systolic function mildly reduced with normal RV size. LA severely dilated. RA moderately dilated. Normal mitral valve. Severe calcification of aortic valve with moderate aortic stenosis.  Past Medical History:   Diagnosis Date   Atrial fibrillation ([place])    Cataract    Diabetes mellitus without complication ([place])    Hypertension    Seizures ([place])    Thought to be related to something his GF gave him     Family History  Problem Relation Age of Onset   CAD Mother        MI at age 25     Social History   Socioeconomic History   Marital status: Widowed    Spouse name: Not on file   Number of children: 2   Years of education: Not on file   Highest education level: Not on file  Occupational History   Occupation: retired    Comment: Geophysicist/field seismologist  Tobacco Use   Smoking status: Never   Smokeless tobacco: Never  Vaping Use   Vaping Use: Never used  Substance and Sexual Activity   Alcohol use: Yes    Alcohol/week: 2.0 standard drinks    Types: 1 Shots of liquor, 1 Cans of beer per week    Comment: occasionally   Drug use: No   Sexual activity: Not on file  Other Topics Concern   Not on file  Social History Narrative   Not on file   Social Determinants of Health   Financial Resource Strain: Not on file  Food Insecurity: Not on file  Transportation Needs: Not on file  Physical Activity: Not on file  Stress: Not on file  Social Connections: Not on file  Intimate Partner Violence:  Not on file     No Known Allergies   Outpatient Medications Prior to Visit  Medication Sig Dispense Refill   Accu-Chek FastClix Lancets MISC Use 1 lancet daily to test blood sugar (E11.9)     acetaminophen (TYLENOL) 500 MG tablet Take 1,000 mg by mouth every 6 (six) hours as needed for moderate pain or headache.     amLODipine (NORVASC) 2.5 MG tablet Take 1 tablet (2.5 mg total) by mouth daily. 90 tablet 3   Artificial Tear Ointment (DRY EYES OP) Place 1 drop into both eyes daily.     azelastine (ASTELIN) 0.1 % nasal spray Place 1 spray into both nostrils 2 (two) times daily as needed for rhinitis. Use in each nostril as directed 30 mL 12   Cholecalciferol (VITAMIN D-3) 25 MCG (1000 UT) CAPS Take  1,000 Units by mouth daily.     levothyroxine (SYNTHROID) 25 MCG tablet TAKE 1 TABLET(25 MCG) BY MOUTH DAILY 30 tablet 3   losartan (COZAAR) 100 MG tablet TAKE 1 TABLET(100 MG) BY MOUTH DAILY 90 tablet 3   Multiple Vitamin (MULTI-VITAMIN) tablet Take 1 tablet by mouth daily.     Omega-3 Fatty Acids (FISH OIL) 1000 MG CAPS Take 1,000 mg by mouth daily.     OVER THE COUNTER MEDICATION Take 6 drops by mouth daily. Vitamins A, D, and K     rivaroxaban (XARELTO) 20 MG TABS tablet Take 1 tablet (20 mg total) by mouth daily with supper. 90 tablet 2   triamcinolone cream (KENALOG) 0.1 % Apply 1 application topically 3 (three) times a week.     vitamin B-12 (CYANOCOBALAMIN) 1000 MCG tablet Take 1,000 mcg by mouth daily.     No facility-administered medications prior to visit.   Review of Systems  Constitutional:  Negative for chills, fever, malaise/fatigue and weight loss.  HENT:  Negative for congestion, sinus pain and sore throat.   Eyes:  Positive for double vision.  Respiratory:  Positive for shortness of breath. Negative for cough, hemoptysis, sputum production and wheezing.   Cardiovascular:  Negative for chest pain, palpitations, orthopnea, claudication and leg swelling.  Gastrointestinal:  Negative for abdominal pain, heartburn, nausea and vomiting.  Genitourinary: Negative.   Musculoskeletal:  Negative for joint pain and myalgias.  Skin:  Negative for rash.  Neurological:  Positive for dizziness. Negative for weakness.  Endo/Heme/Allergies: Negative.   Psychiatric/Behavioral: Negative.     Objective:   Vitals:   04/23/21 1400  BP: 124/68  Pulse: 68  SpO2: 98%  Weight: 295 lb 3.2 oz (133.9 kg)  Height: 6\' 3"  (1.905 m)     Physical Exam Constitutional:      General: He is not in acute distress.    Appearance: He is obese.  HENT:     Head: Normocephalic and atraumatic.  Eyes:     Conjunctiva/sclera: Conjunctivae normal.  Cardiovascular:     Rate and Rhythm: Normal rate and  regular rhythm.     Pulses: Normal pulses.     Heart sounds: Normal heart sounds. No murmur heard. Pulmonary:     Effort: Pulmonary effort is normal.     Breath sounds: Normal breath sounds.  Musculoskeletal:     Right lower leg: No edema.     Left lower leg: No edema.  Skin:    General: Skin is warm and dry.  Neurological:     General: No focal deficit present.     Mental Status: He is alert.  Psychiatric:  Mood and Affect: Mood normal.        Behavior: Behavior normal.        Thought Content: Thought content normal.        Judgment: Judgment normal.    CBC    Component Value Date/Time   WBC 6.4 12/24/2020 1743   RBC 4.87 12/24/2020 1743   HGB 16.4 12/24/2020 1743   HGB 16.3 10/18/2020 0930   HCT 49.2 12/24/2020 1743   HCT 48.4 10/18/2020 0930   PLT 140.0 (L) 12/24/2020 1743   PLT 144 (L) 10/18/2020 0930   MCV 101.0 (H) 12/24/2020 1743   MCV 97 10/18/2020 0930   MCH 33.1 12/11/2020 1610   MCHC 33.3 12/24/2020 1743   RDW 14.3 12/24/2020 1743   RDW 12.6 10/18/2020 0930   LYMPHSABS 1.7 12/24/2020 1743   LYMPHSABS 1.7 07/07/2017 1149   MONOABS 0.5 12/24/2020 1743   EOSABS 0.2 12/24/2020 1743   EOSABS 0.1 07/07/2017 1149   BASOSABS 0.1 12/24/2020 1743   BASOSABS 0.0 07/07/2017 1149   BMP Latest Ref Rng & Units 02/12/2021 12/24/2020 12/12/2020  Glucose 70 - 99 mg/dL 126(H) 112(H) 111(H)  BUN 6 - 23 mg/dL 21 27(H) 23  Creatinine 0.40 - 1.50 mg/dL 1.18 1.33 1.36(H)  BUN/Creat Ratio 10 - 24 - - -  Sodium 135 - 145 mEq/L 139 141 137  Potassium 3.5 - 5.1 mEq/L 4.8 4.9 5.0  Chloride 96 - 112 mEq/L 103 101 103  CO2 19 - 32 mEq/L 28 29 27   Calcium 8.4 - 10.5 mg/dL 9.8 9.5 9.2   Chest imaging: CXR 07/21/18 Normal cardiac silhouette ectatic aorta. Band of atelectasis in the lingula. No focal consolidation. No pleural fluid. No pneumothorax. Degenerative osteophytosis of the spine.  PFT: PFT Results Latest Ref Rng & Units 04/23/2021  FVC-Pre L 3.01  FVC-Predicted  Pre % 57  FVC-Post L 3.22  FVC-Predicted Post % 61  Pre FEV1/FVC % % 72  Post FEV1/FCV % % 74  FEV1-Pre L 2.17  FEV1-Predicted Pre % 56  FEV1-Post L 2.40  DLCO uncorrected ml/min/mmHg 27.76  DLCO UNC% % 93  DLCO corrected ml/min/mmHg 26.63  DLCO COR %Predicted % 89  DLVA Predicted % 112  TLC L 6.74  TLC % Predicted % 83  RV % Predicted % 124  PFTs 2023: non-specific PFT pattern  Labs:  Path:  Echo 12/12/20: LV EF 50-55%. Mild LVH. RV systolic function is mildly reduced. RV size is normal.   Heart Catheterization 10/24/20:   Ost LM to Mid LM lesion is 30% stenosed.   Prox LAD lesion is 30% stenosed.   Mid Cx lesion is 45% stenosed.   Prox RCA to Mid RCA lesion is 30% stenosed.   The left ventricular systolic function is normal.   LV end diastolic pressure is mildly elevated.   The left ventricular ejection fraction is 55-65% by visual estimate.   Hemodynamic findings consistent with mild pulmonary hypertension.   There is no aortic valve stenosis.   There is trivial (1+) mitral regurgitation.   Nonobstructive CAD Minimally elevated left heart filling pressures.  Mild pulmonary HTN. Normal cardiac output.  Flowsheet Row Most Recent Value  Fick Cardiac Output 5.1 L/min  Fick Cardiac Output Index 2 (L/min)/BSA  RA A Wave 11 mmHg  RA V Wave 13 mmHg  RA Mean 11 mmHg  RV Systolic Pressure 36 mmHg  RV Diastolic Pressure 7 mmHg  RV EDP 12 mmHg  PA Systolic Pressure 35 mmHg  PA Diastolic Pressure  21 mmHg  PA Mean 27 mmHg  PW A Wave 20 mmHg  PW V Wave 22 mmHg  PW Mean 20 mmHg  AO Systolic Pressure 638 mmHg  AO Diastolic Pressure 72 mmHg  AO Mean 90 mmHg  LV Systolic Pressure 453 mmHg  LV Diastolic Pressure 9 mmHg  LV EDP 16 mmHg  AOp Systolic Pressure 646 mmHg  AOp Diastolic Pressure 75 mmHg  AOp Mean Pressure 803 mmHg  LVp Systolic Pressure 212 mmHg  LVp Diastolic Pressure 10 mmHg  LVp EDP Pressure 19 mmHg  QP/QS 1  TPVR Index 13.49 HRUI  TSVR Index 44.97  HRUI  PVR SVR Ratio 0.09  TPVR/TSVR Ratio 0.3      Assessment & Plan:   Abnormal PFT  Pulmonary hypertension (HCC)  Severe obesity (BMI 35.0-35.9 with comorbidity) (HCC)  Shortness of breath  Discussion: Einer Meals is a 71 year old male, never smoker with atrial fibrillation, thyroid disease, OSA on cpap, DMII and hypertension who is referred to pulmonary clinic for shortness of breath.  His shortness of breath appears multifactorial at this time given his weight gain, significant decline in physical activity due to his vision and dizziness symptoms, underlying heart disease, mild pulmonary hypertension, and possible issues with his thyroid levels.   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 He may have pulmonary hypertension due to his aortic valve disease vs heart failure with preserved EF.   He has history of sleep apnea which could contribute to group 3 disease for his pulmonary hypertension.   He has non-specific PFT pattern today. We will trial him on spiriva 2.40mcg 2 puffs daily and monitor for any changes in his vision.   He is to schedule follow up appointment with our sleep medicine team for his sleep apnea.  Follow up in 6 months.  Freda Jackson, MD [facility] Office: [phone]   Current Outpatient Medications:    Accu-Chek FastClix Lancets MISC, Use 1 lancet daily to test blood sugar (E11.9), Disp: , Rfl:    acetaminophen (TYLENOL) 500 MG tablet, Take 1,000 mg by mouth every 6 (six) hours as needed for moderate pain or headache., Disp: , Rfl:    amLODipine (NORVASC) 2.5 MG tablet, Take 1 tablet (2.5 mg total) by mouth daily., Disp: 90 tablet, Rfl: 3   Artificial Tear Ointment (DRY EYES OP), Place 1 drop into both eyes daily., Disp: , Rfl:    azelastine (ASTELIN) 0.1 % nasal spray, Place 1 spray into both nostrils 2 (two) times daily as needed for  rhinitis. Use in each nostril as directed, Disp: 30 mL, Rfl: 12   Cholecalciferol (VITAMIN D-3) 25 MCG (1000 UT) CAPS, Take 1,000 Units by mouth daily., Disp: , Rfl:    levothyroxine (SYNTHROID) 25 MCG tablet, TAKE 1 TABLET(25 MCG) BY MOUTH DAILY, Disp: 30 tablet, Rfl: 3   losartan (COZAAR) 100 MG tablet, TAKE 1 TABLET(100 MG) BY MOUTH DAILY, Disp: 90 tablet, Rfl: 3   Multiple Vitamin (MULTI-VITAMIN) tablet, Take 1 tablet by mouth daily., Disp: , Rfl:    Omega-3 Fatty Acids (FISH OIL) 1000 MG CAPS, Take 1,000 mg by mouth daily., Disp: , Rfl:    OVER THE COUNTER MEDICATION, Take 6 drops by mouth daily. Vitamins A, D, and K, Disp: , Rfl:    rivaroxaban (XARELTO) 20 MG TABS tablet, Take 1 tablet (20 mg total) by mouth daily with supper., Disp: 90 tablet,  Rfl: 2   triamcinolone cream (KENALOG) 0.1 %, Apply 1 application topically 3 (three) times a week., Disp: , Rfl:    vitamin B-12 (CYANOCOBALAMIN) 1000 MCG tablet, Take 1,000 mcg by mouth daily., Disp: , Rfl:

## 2021-04-23 NOTE — Patient Instructions (Addendum)
Try spiriva 2.84mcg 2 puffs daily ?- if you notice improvement in your shortness of breath, let us know and we will call in a prescription ? ?Schedule video visit with one of our sleep physicians to get your CPAP straightened out.  ? ?Follow up in 6 months.  ? ? ? ?

## 2021-04-24 NOTE — Progress Notes (Signed)
Pt advised of His EMG results.   Pt wanted to know if Dr.Jaffe could check the CTA of the neck. He was advised that there was a pinched nerve. Can Dr.Jaffe look at it and let him know if that was correct.

## 2021-04-25 ENCOUNTER — Ambulatory Visit: Payer: Medicare Other | Admitting: Neurology

## 2021-04-25 ENCOUNTER — Ambulatory Visit: Payer: Medicare Other

## 2021-04-30 NOTE — Progress Notes (Signed)
Pt advised.  Per pt his balance is still off, Double vision. Patient wanted to know what else he could do.

## 2021-05-13 ENCOUNTER — Telehealth: Payer: Self-pay | Admitting: Neurology

## 2021-05-13 NOTE — Telephone Encounter (Signed)
Pt advised of Dr.Jaffe note This is not a neurologic problem.  He needs to follow up with his PCP. ?

## 2021-05-13 NOTE — Telephone Encounter (Signed)
Pt called in stating his symptoms of his condition is getting worse. He is not sure where to turn next. He has been very off balance and lives alone. 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 He would like to speak with someone. ?

## 2021-05-16 ENCOUNTER — Telehealth: Payer: Self-pay

## 2021-05-16 NOTE — Telephone Encounter (Signed)
Sent in error

## 2021-05-18 ENCOUNTER — Telehealth: Payer: Self-pay | Admitting: Medical

## 2021-05-18 NOTE — Telephone Encounter (Signed)
Pt has chronic dizziness since 2014. He is scheduled for video or phone call. Will you call pt and advise/offer office visit as vitals and neurologic exam always good idea with pt his age and his complaints. ?

## 2021-05-19 ENCOUNTER — Ambulatory Visit (INDEPENDENT_AMBULATORY_CARE_PROVIDER_SITE_OTHER): Payer: Medicare Other | Admitting: Medical

## 2021-05-19 VITALS — BP 117/77 | HR 66 | Resp 18 | Wt 288.6 lb

## 2021-05-19 DIAGNOSIS — I10 Essential (primary) hypertension: Secondary | ICD-10-CM

## 2021-05-19 DIAGNOSIS — H538 Other visual disturbances: Secondary | ICD-10-CM

## 2021-05-19 DIAGNOSIS — E890 Postprocedural hypothyroidism: Secondary | ICD-10-CM | POA: Diagnosis not present

## 2021-05-19 DIAGNOSIS — R42 Dizziness and giddiness: Secondary | ICD-10-CM | POA: Diagnosis not present

## 2021-05-19 MED ORDER — AMMONIUM LACTATE 12 % EX CREA
1.0000 | TOPICAL_CREAM | CUTANEOUS | 0 refills | Status: DC | PRN
Start: 2021-05-19 — End: 2022-11-09

## 2021-05-19 NOTE — Progress Notes (Addendum)
? ?Subjective:  ? ? Patient ID: Michael Whitney, male    DOB: August 20, 1950, 71 y.o.   MRN: [id number] ? ?HPI ?Pt in for follow up. ? ?He has chronic history of dizziness. ? ?Pt most has seen neurologist and ophthalmologist to evaluate fully for long standing chronic dizziness. Pt has seen ENT, PT and seeing endocrinologist.  ? ? ?Pt has hypothyroid recently but hx of hyperthyroidism. On synthroid 25 mcg daily.  ? ? ?Dr. Kelton Pillar most recent A/P from the below. ? ?" ?Hashimoto's Disease: ?  ?- He has had a hx of hyperthyroidism since 2015 and has been on Methimazole until 08/2020  ?- Per pt he has had RAI ablation in 2022- details not available  ?- Pt educated extensively on the correct way to take levothyroxine (first thing in the morning with water, 30 minutes before eating or taking other medications). ?- Pt encouraged to double dose the following day if she were to miss a dose given long half-life of levothyroxine. ?  ?  ?Medications : ?Continue Levothyroxine 25 mcg daily  ?  ?2. Diplopia : ?  ?- Pt is follow up with Dr. Tomi Likens for a working diagnosis of MG . ?- He continues to follow with ophthalmology as well  ?- Will check TRAb and TSI for Grave's Disease.  ?  ?Labs in 8 weeks  ?F/U in 4 months " ? ?Pt got message that ophthalmologist think his visual issues are likely underlying factor in his dizziness.  ? ?Most recent ophthalmologist A/P ? ?"ASSESSMENT/PLAN ? ?ICD-10-CM  ?1. 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 ?2. 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 ?3. Pseudophakia of both eyes Z96.1  ? ?4. Multiple thyroid nodules E04.2  ? ? ?Return in about 9 months (around 02/07/2022) for CEE. ? ?Patient Instructions  ?MR was checked today and Rx was given  ? ?Recommend to put Fresnel prisms over existing lenses to see if it works and patient can tolerate them. ?Fresnel Prisms can be oriented in oblique meridian for symmetry. ?Recommend to see Dr. Orlie Dakin to confirm prism and update glasses if needed ?Permanent  prisms can be considered if Fresnel prisms work  ? ?No signs of inflammatory stage of Thyroid Orbitopathy; likely muscles are in fibrotic stage  ? ?Ophthalmic Meds Ordered this visit:  ? ?There were no meds ordered this visit." ? ?Pt has seen physical therapist in past and he felt that therapy helped a lot. ? ? ?Neurologist evaluated and worked up. Dr. Tomi Likens stated pt dizziness and balance not neurologic problem. Advised him to see me.  ? ? ? ? ?Review of Systems  ?Constitutional:  Negative for chills, fatigue and fever.  ?HENT:  Negative for congestion, drooling and ear pain.   ?Respiratory:  Negative for cough, chest tightness, shortness of breath and wheezing.   ?Cardiovascular:  Negative for chest pain and palpitations.  ?Gastrointestinal:  Negative for abdominal pain.  ?Musculoskeletal:  Negative for back pain, myalgias and neck pain.  ?Skin:  Negative for rash.  ?Neurological:  Negative for dizziness, tremors, speech difficulty, weakness, light-headedness and headaches.  ?Hematological:  Negative for adenopathy. Does not bruise/bleed easily.  ?Psychiatric/Behavioral:  Negative for behavioral problems, confusion and sleep disturbance. The patient is not nervous/anxious.   ? ? ?Past Medical History:  ?Diagnosis Date  ? Atrial fibrillation ([place])   ? Cataract   ? Diabetes mellitus without complication ([place])   ? Hypertension   ? Seizures ([place])   ? Thought to be related to something  his GF gave him  ? ?  ?Social History  ? ?Socioeconomic History  ? Marital status: Widowed  ?  Spouse name: Not on file  ? Number of children: 2  ? Years of education: Not on file  ? Highest education level: Not on file  ?Occupational History  ? Occupation: retired  ?  Comment: Photographer  ?Tobacco Use  ? Smoking status: Never  ? Smokeless tobacco: Never  ?Vaping Use  ? Vaping Use: Never used  ?Substance and Sexual Activity  ? Alcohol use: Yes  ?  Alcohol/week: 2.0 standard drinks  ?  Types: 1 Shots of liquor, 1 Cans of beer per week   ?  Comment: occasionally  ? Drug use: No  ? Sexual activity: Not on file  ?Other Topics Concern  ? Not on file  ?Social History Narrative  ? Not on file  ? ?Social Determinants of Health  ? ?Financial Resource Strain: Not on file  ?Food Insecurity: Not on file  ?Transportation Needs: Not on file  ?Physical Activity: Not on file  ?Stress: Not on file  ?Social Connections: Not on file  ?Intimate Partner Violence: Not on file  ? ? ?Past Surgical History:  ?Procedure Laterality Date  ? CATARACT EXTRACTION    ? PVC ABLATION N/A 07/14/2017  ? Procedure: PVC ABLATION;  Surgeon: Evans Lance, MD;  Location: [facility];  Service: Cardiovascular;  Laterality: N/A;  ? RIGHT/LEFT HEART CATH AND CORONARY ANGIOGRAPHY N/A 10/24/2020  ? Procedure: RIGHT/LEFT HEART CATH AND CORONARY ANGIOGRAPHY;  Surgeon: [name], Peter M, MD;  Location: [facility];  Service: Cardiovascular;  Laterality: N/A;  ? TONSILLECTOMY    ? VARICOSE VEIN SURGERY    ? ? ?Family History  ?Problem Relation Age of Onset  ? CAD Mother   ?     MI at age 78  ? ? ?No Known Allergies ? ?Current Outpatient Medications on File Prior to Visit  ?Medication Sig Dispense Refill  ? Accu-Chek FastClix Lancets MISC Use 1 lancet daily to test blood sugar (E11.9)    ? acetaminophen (TYLENOL) 500 MG tablet Take 1,000 mg by mouth every 6 (six) hours as needed for moderate pain or headache.    ? amLODipine (NORVASC) 2.5 MG tablet Take 1 tablet (2.5 mg total) by mouth daily. 90 tablet 3  ? Artificial Tear Ointment (DRY EYES OP) Place 1 drop into both eyes daily.    ? azelastine (ASTELIN) 0.1 % nasal spray Place 1 spray into both nostrils 2 (two) times daily as needed for rhinitis. Use in each nostril as directed 30 mL 12  ? Cholecalciferol (VITAMIN D-3) 25 MCG (1000 UT) CAPS Take 1,000 Units by mouth daily.    ? levothyroxine (SYNTHROID) 25 MCG tablet TAKE 1 TABLET(25 MCG) BY MOUTH DAILY 30 tablet 3  ? losartan (COZAAR) 100 MG tablet TAKE 1 TABLET(100 MG) BY MOUTH  DAILY 90 tablet 3  ? Multiple Vitamin (MULTI-VITAMIN) tablet Take 1 tablet by mouth daily.    ? Omega-3 Fatty Acids (FISH OIL) 1000 MG CAPS Take 1,000 mg by mouth daily.    ? OVER THE COUNTER MEDICATION Take 6 drops by mouth daily. Vitamins A, D, and K    ? rivaroxaban (XARELTO) 20 MG TABS tablet Take 1 tablet (20 mg total) by mouth daily with supper. 90 tablet 2  ? Tiotropium Bromide Monohydrate (SPIRIVA RESPIMAT) 2.5 MCG/ACT AERS Inhale 2 puffs into the lungs daily. 4 g 0  ? triamcinolone cream (KENALOG)  0.1 % Apply 1 application topically 3 (three) times a week.    ? vitamin B-12 (CYANOCOBALAMIN) 1000 MCG tablet Take 1,000 mcg by mouth daily.    ? ?No current facility-administered medications on file prior to visit.  ? ? ?BP 117/77   Pulse 66   Resp 18   Wt 288 lb 9.6 oz (130.9 kg)   SpO2 97%   BMI 36.07 kg/m?  ?  ?   ?Objective:  ? Physical Exam ? ?General ?Mental Status- Alert. General Appearance- Not in acute distress.  ? ?Skin ?Dry skin both arms and legs. ? ?Chest and Lung Exam ?Auscultation: ?Breath Sounds:-Normal. ? ?Cardiovascular ?Auscultation:Rythm- Regular. ?Murmurs & Other Heart Sounds:Auscultation of the heart reveals- No Murmurs. ? ? ? ? ? ?Neurologic ?Cranial Nerve exam:- CN III-XII intact(No nystagmus), symmetric smile. ?Strength:- 5/5 equal and symmetric strength both upper and lower extremities.  ? ? ?   ?Assessment & Plan:  ? ?Patient Instructions  ?Postoperative hypothyroid with Hashimoto's findings on labs recently.  Continue low-dose levothyroxine 25 mcg daily. ? ?Will refer to new endocrinologist at your request.  Will get opinion from endocrinologist whether or not possible fibrotic extraocular muscles contributing to vision changes.  Also will get opinion if they think you have thyroid eye disease associated with prior hyperactive thyroid before you were hypothyroid. ? ?Diplopia and constant feeling of dizziness/off-balance for years.  On discussion and review hopefully the [facility]  prisms will improve your vision and subsequently help with dizzy sensation. ? ?I reviewed various specialist notes today and did discuss their findings.  Particularly we discussed that neurologist does not think you

## 2021-05-19 NOTE — Telephone Encounter (Signed)
Pt states he will try to find a ride because he doesn't drive  ?

## 2021-05-19 NOTE — Patient Instructions (Signed)
Postoperative hypothyroid with Hashimoto's findings on labs recently.  Continue low-dose levothyroxine 25 mcg daily. ? ?Will refer to new endocrinologist at your request.  Will get opinion from endocrinologist whether or not possible fibrotic extraocular muscles contributing to vision changes.  Also will get opinion if they think you have thyroid eye disease associated with prior hyperactive thyroid before you were hypothyroid. ? ?Diplopia and constant feeling of dizziness/off-balance for years.  On discussion and review hopefully the [facility] prisms will improve your vision and subsequently help with dizzy sensation. ? ?I reviewed various specialist notes today and did discuss their findings.  Particularly we discussed that neurologist does not think you have a neurologic diagnosis causing dizziness. ? ?Since you do report that prior physical therapy did help with your dizziness symptoms decided to refer you to new physical therapist. ? ?For dry appearing skin to upper and lower extremities I prescribed Lac-Hydrin.  The areas on your back that former dermatologist did not treat appear to be seborrheic keratosis.  Can refer to new dermatologist if you want. ? ?Follow-up in 1 month or sooner if needed. ?

## 2021-05-20 ENCOUNTER — Encounter: Payer: Self-pay | Admitting: Medical

## 2021-05-20 ENCOUNTER — Telehealth: Payer: Self-pay | Admitting: Medical

## 2021-05-20 LAB — TSH: TSH: 3.72 u[IU]/mL (ref 0.35–5.50)

## 2021-05-20 LAB — T4, FREE: Free T4: 0.52 ng/dL — ABNORMAL LOW (ref 0.60–1.60)

## 2021-05-20 LAB — THYROID PEROXIDASE ANTIBODY: Thyroperoxidase Ab SerPl-aCnc: 148 IU/mL — ABNORMAL HIGH (ref <9)

## 2021-05-20 MED ORDER — ACCU-CHEK AVIVA PLUS VI STRP: ORAL_STRIP | 12 refills | Status: DC

## 2021-05-20 MED ORDER — ACCU-CHEK AVIVA PLUS VI STRP
ORAL_STRIP | 12 refills | Status: DC
Start: 2021-05-20 — End: 2021-05-20

## 2021-05-20 NOTE — Telephone Encounter (Signed)
Test stripssent

## 2021-05-20 NOTE — Telephone Encounter (Signed)
Pt stated he was supposed to received thi srx as well. Please advise.  ? ?Medication: accu-check test strips ? ?Has the patient contacted their pharmacy? Yes.   ? ?Preferred Pharmacy:  ?[facility] [place] [place]  ? [place], [place] [address]  ?Phone:  [phone]  Fax:  [phone]  ? ?

## 2021-05-21 ENCOUNTER — Other Ambulatory Visit: Payer: Self-pay | Admitting: Internal Medicine

## 2021-05-21 ENCOUNTER — Telehealth: Payer: Self-pay | Admitting: Internal Medicine

## 2021-05-21 MED ORDER — LEVOTHYROXINE SODIUM 50 MCG PO TABS: 50.0000 ug | ORAL_TABLET | Freq: Every day | ORAL | 3 refills | Status: DC

## 2021-05-21 NOTE — Telephone Encounter (Signed)
Patient was advised and verbalized understanding. Patient was offered a appointment and states that he will call back to schedule .  ?

## 2021-05-21 NOTE — Telephone Encounter (Signed)
Please let the pt know that his PCP sent me his latest thyroid tests and adjustment is needed  ? ? ? ?Needs to stop levothyroxine 25 mcg and start Levothyroxine 50 mcg daily. ? ? ?Pt needs an OV with me in 3 months  ? ? ?Thanks  ?

## 2021-05-21 NOTE — Telephone Encounter (Signed)
-----   Message from Mackie Pai, PA-C sent at 05/20/2021  9:52 PM EDT ----- ?I saw mutual pt recently for follow up and he requested that I repeat thyroid labs. I wanted to send you the lab results to make adjustment to thyroid treatment if you think it is necessary as you have seen pt recently. Thanks for your help with our patients. ?

## 2021-05-23 ENCOUNTER — Telehealth: Payer: Self-pay | Admitting: Medical

## 2021-05-23 NOTE — Telephone Encounter (Signed)
Opened to review 

## 2021-06-02 MED ORDER — ACCU-CHEK GUIDE VI STRP: ORAL_STRIP | 12 refills | Status: DC

## 2021-06-02 NOTE — Telephone Encounter (Signed)
See notes. Please advise.  ?

## 2021-06-02 NOTE — Addendum Note (Signed)
Addended by: Jeronimo Greaves on: 06/02/2021 03:46 PM ? ? Modules accepted: Orders ? ?

## 2021-06-02 NOTE — Telephone Encounter (Signed)
Pt states he received the plus test strips and normally gets the standard ones.  ? ?Please advise.  ?

## 2021-06-02 NOTE — Telephone Encounter (Signed)
Test strips sent in. 

## 2021-06-18 ENCOUNTER — Ambulatory Visit: Payer: Medicare HMO | Admitting: Cardiology

## 2021-06-18 ENCOUNTER — Encounter: Payer: Self-pay | Admitting: Cardiology

## 2021-06-18 VITALS — BP 142/80 | HR 51 | Ht 75.0 in | Wt 297.8 lb

## 2021-06-18 DIAGNOSIS — I4821 Permanent atrial fibrillation: Secondary | ICD-10-CM | POA: Diagnosis not present

## 2021-06-18 DIAGNOSIS — I272 Pulmonary hypertension, unspecified: Secondary | ICD-10-CM

## 2021-06-18 DIAGNOSIS — I25118 Atherosclerotic heart disease of native coronary artery with other forms of angina pectoris: Secondary | ICD-10-CM | POA: Diagnosis not present

## 2021-06-18 DIAGNOSIS — R42 Dizziness and giddiness: Secondary | ICD-10-CM | POA: Diagnosis not present

## 2021-06-18 DIAGNOSIS — J449 Chronic obstructive pulmonary disease, unspecified: Secondary | ICD-10-CM | POA: Diagnosis not present

## 2021-06-18 DIAGNOSIS — I350 Nonrheumatic aortic (valve) stenosis: Secondary | ICD-10-CM

## 2021-06-18 DIAGNOSIS — G4733 Obstructive sleep apnea (adult) (pediatric): Secondary | ICD-10-CM | POA: Diagnosis not present

## 2021-06-18 DIAGNOSIS — Z7901 Long term (current) use of anticoagulants: Secondary | ICD-10-CM

## 2021-06-18 NOTE — Patient Instructions (Signed)
Medication Instructions:  ?Continue same medications ?*If you need a refill on your cardiac medications before your next appointment, please call your pharmacy* ? ? ?Lab Work: ?None ordered ? ? ?Testing/Procedures: ?Echo in 6 months  11/2021 ? ? ?Follow-Up: ?At [facility], you and your health needs are our priority.  As part of our continuing mission to provide you with exceptional heart care, we have created designated Provider Care Teams.  These Care Teams include your primary Cardiologist (physician) and Advanced Practice Providers (APPs -  Physician Assistants and Nurse Practitioners) who all work together to provide you with the care you need, when you need it. ? ?We recommend signing up for the patient portal called "MyChart".  Sign up information is provided on this After Visit Summary.  MyChart is used to connect with patients for Virtual Visits (Telemedicine).  Patients are able to view lab/test results, encounter notes, upcoming appointments, etc.  Non-urgent messages can be sent to your provider as well.   ?To learn more about what you can do with MyChart, go to [email or web address].   ? ?Your next appointment:  6 months after Echo ?  ? ?The format for your next appointment: Office  ? ? ?Provider:  Dr.Jordan ? ? ?Important Information About Sugar ? ? ? ? ? ? ?

## 2021-06-18 NOTE — Progress Notes (Signed)
?  ?Cardiology Office Note ? ? ?Date:  06/18/2021  ? ?ID:  Michael Whitney, DOB 01-24-1951, MRN [id number] ? ?PCP:  Mackie Pai, PA-C  ?Cardiologist:   Greyson Riccardi [name], MD  ? ?Chief Complaint  ?Patient presents with  ? Atrial Fibrillation  ? Dizziness  ? ? ?  ?History of Present Illness: ?Michael Whitney is a 71 y.o. male who presents for follow up. 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 He is seen for post hospital follow up.  ?  ?Diagnosed with atrial fibrillation in 2019.  EP study by Dr. Lovena Le revealing PVCs arising near His bundle and ablation was not recommended due to potential for heart block.  He has been maintained on a rate control therapy with anticoagulation. ?  ?He has longstanding history of chronic dizziness. He states this started very slowly but has progressed over time and is happening every day.  Has been unrelated to changes in blood pressure or heart rate.  He has completed some element of physical therapy with minimal improvement. ?  ?ED visit at [facility] 10/01/2020.  He presented with feeling weak, dyspnea on exertion, chest pain, dizziness, lower extremity edema.  Troponin slightly elevated, BNP slightly elevated. TTE performed with bilateral atria severely dilated, LVEF 55 to 60%, RV moderately to severely dilated, RVSF moderately reduced, mild to moderate MR, moderate TR, mild to moderate pulmonary hypertension.  He was recommended for diuresis.  He was also started on Protonix 40 mg twice daily for 30 days and then decrease to once daily after 1 month at follow-up with PCP.  His hydrochlorothiazide was increased to 25 mg daily.  Imdur 30 mg initiated.  Discharged on 7 day course of Lasix. He was recommended for consideration of right heart cath as an outpatient. ?  ?Seen 10/16/20 noting dizziness which was  persistent. He was concerned about weight gain and wondered if thyroid contributory. He endorsed pain as well as dyspnea on exertion was set up for right and left cardiac catheterization.  Underwent right and left cardiac catheterization 10/24/2020 showing nonobstructive coronary artery disease, minimally elevated left heart filling pressures, mild pulmonary hypertension, normal cardiac output.  He was on appropriate diuretic therapy and noted to have some component of diastolic heart failure.    ?  ?Patient presented to the ED on 12/11/2020 for further evaluation of chronic dizziness which had recently worsened. He also reported blurred/double vision. He denied any loss of vision, headache, or trouble swallowing. He does have some balance issues though. He did reported an episode of substernal chest tightness that lasted for a few hours a few weeks prior to admission but no recurrent chest pain since then.  ?  ?In the ED, EKG showed atrial fibrillation with no acute ischemic changes. High-sensitivity troponin 251 >> 232 >> 216. Trend felt to be c/w demand ischemia. CBC was completely normal. Na 137, K 5.0, Glucose 111, BUN 23, Cr 1.36. 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 Brain MRI showed no acute findings. He was started on statin therapy. Seen by vascular surgery who felt his vertebral stensosis was not causing his dizziness. Follow up with Neuro  and Endocrinology recommended. Echo was unchanged.  ? ?He did have follow up cardiac evaluation last fall. Echo showed EF 50-55%. Mild RV dysfunction. Biatrial enlargement. Severe aortic valve calcification. Moderate to severe AS. Mean gradient only 10 mm Hg. Cardiac cath  done showing nonobstructive CAD. Mild LV filling pressures and mild pulmonary HTN. No significant AV stenosis. He was seen by pulmonary. Tried on spiriva without any change. He is still using CPAP. ?  ?On follow up today he still complains of  dizziness. Reports she has some ocular muscle weakness related to his thyroid disease. He is eating a very healthy diet and going to the gym at least 4 days a week with yoga and weight lifting. Keeps track of BP and HR which have been normal.   ? ?Past Medical History:  ?Diagnosis Date  ? Atrial fibrillation ([place])   ? Cataract   ? Diabetes mellitus without complication ([place])   ? Hypertension   ? Seizures ([place])   ? Thought to be related to something his GF gave him  ? ? ?Past Surgical History:  ?Procedure Laterality Date  ? CATARACT EXTRACTION    ? PVC ABLATION N/A 07/14/2017  ? Procedure: PVC ABLATION;  Surgeon: Evans Lance, MD;  Location: [facility];  Service: Cardiovascular;  Laterality: N/A;  ? RIGHT/LEFT HEART CATH AND CORONARY ANGIOGRAPHY N/A 10/24/2020  ? Procedure: RIGHT/LEFT HEART CATH AND CORONARY ANGIOGRAPHY;  Surgeon: [name], Cheryel Kyte M, MD;  Location: [facility];  Service: Cardiovascular;  Laterality: N/A;  ? TONSILLECTOMY    ? VARICOSE VEIN SURGERY    ? ? ? ?Current Outpatient Medications  ?Medication Sig Dispense Refill  ? Accu-Chek FastClix Lancets MISC Use 1 lancet daily to test blood sugar (E11.9)    ? acetaminophen (TYLENOL) 500 MG tablet Take 1,000 mg by mouth every 6 (six) hours as needed for moderate pain or headache.    ? amLODipine (NORVASC) 2.5 MG tablet Take 1 tablet (2.5 mg total) by mouth daily. 90 tablet 3  ? ammonium lactate (AMLACTIN) 12 % cream Apply 1 application. topically as needed for dry skin. 385 g 0  ? Artificial Tear Ointment (DRY EYES OP) Place 1 drop into both eyes daily.    ? azelastine (ASTELIN) 0.1 % nasal spray Place 1 spray into both nostrils 2 (two) times daily as needed for rhinitis. Use in each nostril as directed  30 mL 12  ? Cholecalciferol (VITAMIN D-3) 25 MCG (1000 UT) CAPS Take 1,000 Units by mouth daily.    ? glucose blood (ACCU-CHEK GUIDE) test strip Use as instructed 100 each 12  ? levothyroxine (SYNTHROID) 50 MCG tablet Take 1 tablet (50 mcg total) by mouth daily. 90 tablet 3  ? losartan (COZAAR) 100 MG tablet TAKE 1 TABLET(100 MG) BY MOUTH DAILY 90 tablet 3  ? Multiple Vitamin (MULTI-VITAMIN) tablet Take 1 tablet by mouth daily.    ? Omega-3 Fatty Acids (FISH OIL) 1000 MG CAPS Take 1,000 mg by mouth daily.    ? OVER THE COUNTER MEDICATION Take 6 drops by mouth daily. Vitamins A, D, and K    ? rivaroxaban (XARELTO) 20 MG TABS tablet Take 1 tablet (20 mg total) by mouth daily with supper. 90 tablet 2  ? Tiotropium Bromide Monohydrate (SPIRIVA RESPIMAT) 2.5 MCG/ACT AERS Inhale 2 puffs into the lungs daily. 4 g 0  ? triamcinolone cream (KENALOG) 0.1 % Apply 1 application topically 3 (three) times a week.    ? vitamin B-12 (CYANOCOBALAMIN)  1000 MCG tablet Take 1,000 mcg by mouth daily.    ? ?No current facility-administered medications for this visit.  ? ? ?Allergies:   Patient has no known allergies.  ? ? ?Social History:  The patient  reports that he has never smoked. He has never used smokeless tobacco. He reports current alcohol use of about 2.0 standard drinks per week. He reports that he does not use drugs.  ? ?Family History:  The patient's family history includes CAD in his mother.  ? ? ?ROS:  Please see the history of present illness.   Otherwise, review of systems are positive for none.   All other systems are reviewed and negative.  ? ? ?PHYSICAL EXAM: ?VS:  BP (!) 142/80   Pulse (!) 51   Ht '6\' 3"'$  (1.905 m)   Wt 297 lb 12.8 oz (135.1 kg)   SpO2 94%   BMI 37.22 kg/m?  , BMI Body mass index is 37.22 kg/m?. ?GEN: Well nourished, well developed, in no acute distress ?HEENT: normal ?Neck: no JVD, carotid bruits, or masses ?Cardiac: IRRR; gr 2/6 systolic murmur RUSB, no rubs, or gallops,no edema  ?Respiratory:   clear to auscultation bilaterally, normal work of breathing ?GI: soft, nontender, nondistended, + BS ?MS: no deformity or atrophy ?Skin: warm and dry, no rash ?Neuro:  Strength and sensation are intact ?Psych: euthymic

## 2021-06-27 DIAGNOSIS — R6889 Other general symptoms and signs: Secondary | ICD-10-CM | POA: Diagnosis not present

## 2021-06-27 DIAGNOSIS — R42 Dizziness and giddiness: Secondary | ICD-10-CM | POA: Diagnosis not present

## 2021-07-03 DIAGNOSIS — R6889 Other general symptoms and signs: Secondary | ICD-10-CM | POA: Diagnosis not present

## 2021-07-03 DIAGNOSIS — R42 Dizziness and giddiness: Secondary | ICD-10-CM | POA: Diagnosis not present

## 2021-07-09 DIAGNOSIS — R6889 Other general symptoms and signs: Secondary | ICD-10-CM | POA: Diagnosis not present

## 2021-07-09 DIAGNOSIS — R42 Dizziness and giddiness: Secondary | ICD-10-CM | POA: Diagnosis not present

## 2021-07-16 DIAGNOSIS — R6889 Other general symptoms and signs: Secondary | ICD-10-CM | POA: Diagnosis not present

## 2021-07-16 DIAGNOSIS — R42 Dizziness and giddiness: Secondary | ICD-10-CM | POA: Diagnosis not present

## 2021-07-24 DIAGNOSIS — Z961 Presence of intraocular lens: Secondary | ICD-10-CM | POA: Diagnosis not present

## 2021-07-24 DIAGNOSIS — H50111 Monocular exotropia, right eye: Secondary | ICD-10-CM | POA: Diagnosis not present

## 2021-07-24 DIAGNOSIS — H5053 Vertical heterophoria: Secondary | ICD-10-CM | POA: Diagnosis not present

## 2021-07-24 DIAGNOSIS — H5203 Hypermetropia, bilateral: Secondary | ICD-10-CM | POA: Diagnosis not present

## 2021-07-24 DIAGNOSIS — H52223 Regular astigmatism, bilateral: Secondary | ICD-10-CM | POA: Diagnosis not present

## 2021-07-25 ENCOUNTER — Ambulatory Visit: Payer: Medicare Other | Admitting: Neurology

## 2021-07-29 ENCOUNTER — Telehealth: Payer: Self-pay | Admitting: Medical

## 2021-07-29 NOTE — Telephone Encounter (Signed)
Pt called stating that he has been having double vision in both eyes (more right than left). Pt's eye specialist stated that it is almost certainly to do with thyroid issues and that she recommended him to go see a specialist to address this issue.

## 2021-07-31 ENCOUNTER — Ambulatory Visit: Payer: Medicare Other | Admitting: Cardiology

## 2021-07-31 ENCOUNTER — Ambulatory Visit: Payer: Medicare Other

## 2021-07-31 NOTE — Telephone Encounter (Signed)
Pt called stating he had called [facility] and they had asked him to have any tests that were done regarding this (3.27.23) faxed over as well as the referral whenever possible to the following number so he can be seen:  Phone: [phone] Fax: [phone]  Appt is scheduled for 6.14.23

## 2021-08-01 ENCOUNTER — Telehealth: Payer: Self-pay | Admitting: Medical

## 2021-08-01 NOTE — Telephone Encounter (Signed)
Computer error during note. So did not finish.

## 2021-08-01 NOTE — Telephone Encounter (Signed)
Pt's ophthalmologist is Dr. Vallery Ridge- her number is [phone]

## 2021-08-06 ENCOUNTER — Other Ambulatory Visit: Payer: Self-pay | Admitting: Physician Assistant

## 2021-08-06 ENCOUNTER — Telehealth: Payer: Self-pay | Admitting: Medical

## 2021-08-06 DIAGNOSIS — H02532 Eyelid retraction right lower eyelid: Secondary | ICD-10-CM | POA: Diagnosis not present

## 2021-08-06 DIAGNOSIS — R6889 Other general symptoms and signs: Secondary | ICD-10-CM | POA: Diagnosis not present

## 2021-08-06 DIAGNOSIS — D485 Neoplasm of uncertain behavior of skin: Secondary | ICD-10-CM | POA: Diagnosis not present

## 2021-08-06 DIAGNOSIS — H02531 Eyelid retraction right upper eyelid: Secondary | ICD-10-CM | POA: Diagnosis not present

## 2021-08-06 DIAGNOSIS — E038 Other specified hypothyroidism: Secondary | ICD-10-CM | POA: Diagnosis not present

## 2021-08-06 DIAGNOSIS — H05243 Constant exophthalmos, bilateral: Secondary | ICD-10-CM | POA: Diagnosis not present

## 2021-08-06 NOTE — Addendum Note (Signed)
Addended by: Anabel Halon on: 08/06/2021 05:36 PM   Modules accepted: Orders

## 2021-08-06 NOTE — Addendum Note (Signed)
Addended by: Anabel Halon on: 08/06/2021 05:32 PM   Modules accepted: Orders

## 2021-08-06 NOTE — Telephone Encounter (Signed)
Pt called and stated he will call back after his appt to make the follow up with PCP because its a lot he wants to talk about   Pt stated he doesn't want to ask endo to send their notes because he feels they're clueless as well

## 2021-08-06 NOTE — Addendum Note (Signed)
Addended by: Anabel Halon on: 08/06/2021 05:41 PM   Modules accepted: Orders

## 2021-08-06 NOTE — Telephone Encounter (Signed)
Patient called with Fabio Bering ([facility]) who stated during patient's last visit he was advised to see the ophthalmologist. However, due to patients health insurance a referral is needed just stating basically "hey, I'm his pcp and I think it'll be in the patients best interest to go here" tele- [phone] (may want to ask for Ginger) she may know more information.

## 2021-08-07 NOTE — Telephone Encounter (Signed)
Pt called checking on the status of referral to [facility]. Advised him that referral had been placed and that they should be reaching out soon. Pt also wanted to know since the referral has been placed, does he still need to follow up with Percell Miller?

## 2021-08-18 ENCOUNTER — Ambulatory Visit (INDEPENDENT_AMBULATORY_CARE_PROVIDER_SITE_OTHER): Payer: Medicare HMO

## 2021-08-18 VITALS — Ht 75.0 in | Wt 297.0 lb

## 2021-08-18 DIAGNOSIS — Z0000 Encounter for general adult medical examination without abnormal findings: Secondary | ICD-10-CM | POA: Diagnosis not present

## 2021-08-18 NOTE — Progress Notes (Addendum)
Subjective:   Michael Whitney is a 71 y.o. male who presents for Medicare Annual/Subsequent preventive examination.  I connected with Deanna today by telephone and verified that I am speaking with the correct person using two identifiers. Location patient: home Location provider: work Persons participating in the virtual visit: patient, Engineer, civil (consulting).    I discussed the limitations, risks, security and privacy concerns of performing an evaluation and management service by telephone and the availability of in person appointments. I also discussed with the patient that there may be a patient responsible charge related to this service. The patient expressed understanding and verbally consented to this telephonic visit.    Interactive audio and video telecommunications were attempted between this provider and patient, however failed, due to patient having technical difficulties OR patient did not have access to video capability.  We continued and completed visit with audio only.  Some vital signs may be absent or patient reported.   Time Spent with patient on telephone encounter: 30 minutes   Review of Systems     Cardiac Risk Factors include: advanced age (>26men, >20 women);diabetes mellitus;dyslipidemia;hypertension;male gender;obesity (BMI >30kg/m2)     Objective:    Today's Vitals   08/18/21 1331  Weight: 297 lb (134.7 kg)  Height: 6\' 3"  (1.905 m)   Body mass index is 37.12 kg/m.     08/18/2021    1:36 PM 02/06/2021    1:14 PM 12/11/2020   10:00 PM 12/11/2020    3:06 PM 10/24/2020    6:18 AM 09/17/2019    2:01 PM 09/09/2019    2:12 PM  Advanced Directives  Does Patient Have a Medical Advance Directive? Yes Yes No No Yes Yes Yes  Type of Estate agent of [place];Living will Healthcare Power of [place];Living will;Out of facility DNR (pink MOST or yellow form)   Healthcare Power of [place];Living will  Healthcare Power of [place];Living will  Does patient want to  make changes to medical advance directive?     No - Patient declined    Copy of Healthcare Power of Attorney in Chart? No - copy requested    No - copy requested    Would patient like information on creating a medical advance directive?   No - Patient declined No - Patient declined       Current Medications (verified) Outpatient Encounter Medications as of 08/18/2021  Medication Sig   Accu-Chek FastClix Lancets MISC Use 1 lancet daily to test blood sugar (E11.9)   acetaminophen (TYLENOL) 500 MG tablet Take 1,000 mg by mouth every 6 (six) hours as needed for moderate pain or headache.   ammonium lactate (AMLACTIN) 12 % cream Apply 1 application. topically as needed for dry skin.   Artificial Tear Ointment (DRY EYES OP) Place 1 drop into both eyes daily.   azelastine (ASTELIN) 0.1 % nasal spray Place 1 spray into both nostrils 2 (two) times daily as needed for rhinitis. Use in each nostril as directed   Cholecalciferol (VITAMIN D-3) 25 MCG (1000 UT) CAPS Take 1,000 Units by mouth daily.   glucose blood (ACCU-CHEK GUIDE) test strip Use as instructed   levothyroxine (SYNTHROID) 50 MCG tablet Take 1 tablet (50 mcg total) by mouth daily.   losartan (COZAAR) 100 MG tablet TAKE 1 TABLET(100 MG) BY MOUTH DAILY   Multiple Vitamin (MULTI-VITAMIN) tablet Take 1 tablet by mouth daily.   Omega-3 Fatty Acids (FISH OIL) 1000 MG CAPS Take 1,000 mg by mouth daily.   OVER THE COUNTER MEDICATION  Take 6 drops by mouth daily. Vitamins A, D, and K   rivaroxaban (XARELTO) 20 MG TABS tablet Take 1 tablet (20 mg total) by mouth daily with supper.   Tiotropium Bromide Monohydrate (SPIRIVA RESPIMAT) 2.5 MCG/ACT AERS Inhale 2 puffs into the lungs daily.   triamcinolone cream (KENALOG) 0.1 % Apply 1 application topically 3 (three) times a week.   vitamin B-12 (CYANOCOBALAMIN) 1000 MCG tablet Take 1,000 mcg by mouth daily.   amLODipine (NORVASC) 2.5 MG tablet Take 1 tablet (2.5 mg total) by mouth daily.   No  facility-administered encounter medications on file as of 08/18/2021.    Allergies (verified) Patient has no known allergies.   History: Past Medical History:  Diagnosis Date   Atrial fibrillation (HCC)    Cataract    Diabetes mellitus without complication (HCC)    Hypertension    Seizures (HCC)    Thought to be related to something his GF gave him   Past Surgical History:  Procedure Laterality Date   CATARACT EXTRACTION     PVC ABLATION N/A 07/14/2017   Procedure: PVC ABLATION;  Surgeon: Marinus Maw, MD;  Location: [facility] INVASIVE CV LAB;  Service: Cardiovascular;  Laterality: N/A;   RIGHT/LEFT HEART CATH AND CORONARY ANGIOGRAPHY N/A 10/24/2020   Procedure: RIGHT/LEFT HEART CATH AND CORONARY ANGIOGRAPHY;  Surgeon: Swaziland, Peter M, MD;  Location: [facility] INVASIVE CV LAB;  Service: Cardiovascular;  Laterality: N/A;   TONSILLECTOMY     VARICOSE VEIN SURGERY     Family History  Problem Relation Age of Onset   CAD Mother        MI at age 86   Social History   Socioeconomic History   Marital status: Widowed    Spouse name: Not on file   Number of children: 2   Years of education: Not on file   Highest education level: Not on file  Occupational History   Occupation: retired    Comment: Environmental manager  Tobacco Use   Smoking status: Never   Smokeless tobacco: Never  Vaping Use   Vaping Use: Never used  Substance and Sexual Activity   Alcohol use: Yes    Alcohol/week: 2.0 standard drinks of alcohol    Types: 1 Shots of liquor, 1 Cans of beer per week    Comment: occasionally   Drug use: No   Sexual activity: Not on file  Other Topics Concern   Not on file  Social History Narrative   Not on file   Social Determinants of Health   Financial Resource Strain: Low Risk  (08/18/2021)   Overall Financial Resource Strain (CARDIA)    Difficulty of Paying Living Expenses: Not hard at all  Food Insecurity: No Food Insecurity (08/18/2021)   Hunger Vital Sign    Worried About Running  Out of Food in the Last Year: Never true    Ran Out of Food in the Last Year: Never true  Transportation Needs: No Transportation Needs (08/18/2021)   PRAPARE - Administrator, Civil Service (Medical): No    Lack of Transportation (Non-Medical): No  Physical Activity: Sufficiently Active (08/18/2021)   Exercise Vital Sign    Days of Exercise per Week: 4 days    Minutes of Exercise per Session: 60 min  Stress: No Stress Concern Present (08/18/2021)   [employer] of Occupational Health - Occupational Stress Questionnaire    Feeling of Stress : Not at all  Social Connections: Socially Isolated (08/18/2021)   Social Connection and Isolation  Panel [NHANES]    Frequency of Communication with Friends and Family: More than three times a week    Frequency of Social Gatherings with Friends and Family: Twice a week    Attends Religious Services: Never    Database administrator or Organizations: No    Attends Banker Meetings: Never    Marital Status: Widowed    Tobacco Counseling Counseling given: Not Answered   Clinical Intake:  Pre-visit preparation completed: Yes  Pain : No/denies pain     BMI - recorded: 37.12 Nutritional Status: BMI > 30  Obese Nutritional Risks: None Diabetes: Yes CBG done?: No Did pt. bring in CBG monitor from home?: No (phone visit)  How often do you need to have someone help you when you read instructions, pamphlets, or other written materials from your doctor or pharmacy?: 1 - Never  Diabetes:  Is the patient diabetic?  Yes  If diabetic, was a CBG obtained today?  No  Did the patient bring in their glucometer from home?  No phone visit How often do you monitor your CBG's? daily.   Financial Strains and Diabetes Management:  Are you having any financial strains with the device, your supplies or your medication? No .  Does the patient want to be seen by Chronic Care Management for management of their diabetes?  No  Would  the patient like to be referred to a Nutritionist or for Diabetic Management?  No   Diabetic Exams:  Diabetic Eye Exam: Completed 07/2021.   Diabetic Foot Exam:  Pt has been advised about the importance in completing this exam. To be completed by PCP.    Interpreter Needed?: No  Information entered by :: Thomasenia Sales LPN   Activities of Daily Living    08/18/2021    1:44 PM 12/11/2020   10:00 PM  In your present state of health, do you have any difficulty performing the following activities:  Hearing? 0 0  Vision? 0 0  Difficulty concentrating or making decisions? 0 0  Walking or climbing stairs? 0 0  Dressing or bathing? 0 0  Doing errands, shopping? 0   Preparing Food and eating ? N   Using the Toilet? N   In the past six months, have you accidently leaked urine? N   Do you have problems with loss of bowel control? N   Managing your Medications? N   Managing your Finances? N   Housekeeping or managing your Housekeeping? N     Patient Care Team: Saguier, Kateri Mc as PCP - General (Internal Medicine) [place], Peter M, MD as PCP - Cardiology (Cardiology) Marinus Maw, MD as PCP - Electrophysiology (Cardiology) Drema Dallas, DO as Consulting Physician (Neurology)  Indicate any recent Medical Services you may have received from other than [facility] providers in the past year (date may be approximate).     Assessment:   This is a routine wellness examination for [place].  Hearing/Vision screen Hearing Screening - Comments:: No issues Vision Screening - Comments:: Last eye exam-07/2021-Dr. Sallyanne Kuster  Dietary issues and exercise activities discussed: Current Exercise Habits: Home exercise routine, Type of exercise: strength training/weights;yoga, Time (Minutes): 60, Frequency (Times/Week): 4, Weekly Exercise (Minutes/Week): 240, Intensity: Mild   Goals Addressed             This Visit's Progress    Patient Stated   On track    Continue healthy eating plan        Depression Screen  08/18/2021    1:43 PM 11/29/2020    9:03 AM 09/06/2019    1:39 PM  PHQ 2/9 Scores  PHQ - 2 Score 1 0 0    Fall Risk    08/18/2021    1:42 PM 02/06/2021    1:14 PM 11/29/2020    9:02 AM 09/06/2019    1:38 PM 05/01/2019    2:46 PM  Fall Risk   Falls in the past year? 0 0 0 0 0  Number falls in past yr: 0 0 0 0   Injury with Fall? 0 0 0 0   Follow up Falls prevention discussed   Falls prevention discussed     FALL RISK PREVENTION PERTAINING TO THE HOME:  Any stairs in or around the home? Yes  If so, are there any without handrails? No  Home free of loose throw rugs in walkways, pet beds, electrical cords, etc? Yes  Adequate lighting in your home to reduce risk of falls? Yes   ASSISTIVE DEVICES UTILIZED TO PREVENT FALLS:  Life alert? No  Use of a cane, walker or w/c? Yes cane occasionally Grab bars in the bathroom? Yes  Shower chair or bench in shower? No  Elevated toilet seat or a handicapped toilet? No   TIMED UP AND GO:  Was the test performed? No . Phone visit   Cognitive Function:        Immunizations Immunization History  Administered Date(s) Administered   Fluad Quad(high Dose 65+) 11/29/2020   Influenza Whole 12/23/2018   Influenza,inj,Quad PF,6+ Mos 01/23/2015   Influenza-Unspecified 01/23/2015, 11/26/2015, 11/26/2015, 11/20/2016   PFIZER(Purple Top)SARS-COV-2 Vaccination 04/01/2019, 04/26/2019   Pfizer Covid-19 Vaccine Bivalent Booster 11yrs & up 11/29/2020   Pneumococcal Conjugate-13 02/14/2016, 02/14/2016   Pneumococcal Polysaccharide-23 01/23/2015, 01/23/2015, 08/12/2018    TDAP status: Due, Education has been provided regarding the importance of this vaccine. Advised may receive this vaccine at local pharmacy or Health Dept. Aware to provide a copy of the vaccination record if obtained from local pharmacy or Health Dept. Verbalized acceptance and understanding.  Flu Vaccine status: Up to date  Pneumococcal vaccine status: Up  to date  Covid-19 vaccine status: Completed vaccines  Qualifies for Shingles Vaccine? Yes   Zostavax completed No   Shingrix Completed?: No.    Education has been provided regarding the importance of this vaccine. Patient has been advised to call insurance company to determine out of pocket expense if they have not yet received this vaccine. Advised may also receive vaccine at local pharmacy or Health Dept. Verbalized acceptance and understanding.  Screening Tests Health Maintenance  Topic Date Due   FOOT EXAM  Never done   OPHTHALMOLOGY EXAM  Never done   TETANUS/TDAP  Never done   Zoster Vaccines- Shingrix (1 of 2) Never done   COLONOSCOPY (Pts 45-50yrs Insurance coverage will need to be confirmed)  Never done   COVID-19 Vaccine (4 - Booster for Pfizer series) 01/24/2021   HEMOGLOBIN A1C  05/30/2021   INFLUENZA VACCINE  09/23/2021   Pneumonia Vaccine 62+ Years old  Completed   Hepatitis C Screening  Completed   HPV VACCINES  Aged Out    Health Maintenance  Health Maintenance Due  Topic Date Due   FOOT EXAM  Never done   OPHTHALMOLOGY EXAM  Never done   TETANUS/TDAP  Never done   Zoster Vaccines- Shingrix (1 of 2) Never done   COLONOSCOPY (Pts 45-45yrs Insurance coverage will need to be confirmed)  Never done  COVID-19 Vaccine (4 - Booster for Pfizer series) 01/24/2021   HEMOGLOBIN A1C  05/30/2021   Colorectal cancer screening: Due-Declined today  Lung Cancer Screening: (Low Dose CT Chest recommended if Age 73-80 years, 30 pack-year currently smoking OR have quit w/in 15years.) does not qualify.     Additional Screening:  Hepatitis C Screening: Completed 11/05/2020  Vision Screening: Recommended annual ophthalmology exams for early detection of glaucoma and other disorders of the eye. Is the patient up to date with their annual eye exam?  Yes  Who is the provider or what is the name of the office in which the patient attends annual eye exams? Dr. Sallyanne Kuster  Dental  Screening: Recommended annual dental exams for proper oral hygiene  Community Resource Referral / Chronic Care Management: CRR required this visit?  No   CCM required this visit?  No      Plan:     I have personally reviewed and noted the following in the patient's chart:   Medical and social history Use of alcohol, tobacco or illicit drugs  Current medications and supplements including opioid prescriptions. Patient is not currently taking opioid prescriptions. Functional ability and status Nutritional status Physical activity Advanced directives List of other physicians Hospitalizations, surgeries, and ER visits in previous 12 months Vitals Screenings to include cognitive, depression, and falls Referrals and appointments  In addition, I have reviewed and discussed with patient certain preventive protocols, quality metrics, and best practice recommendations. A written personalized care plan for preventive services as well as general preventive health recommendations were provided to patient.   Due to this being a telephonic visit, the after visit summary with patients personalized plan was offered to patient via mail or my-chart. Patient would like to access on my-chart.    Roanna Raider, LPN   03/25/8655  Nurse Health Advisor  Nurse Notes: None  Review and Agree with assessment & plan of LPN   Esperanza Richters, PA-C

## 2021-09-03 ENCOUNTER — Ambulatory Visit
Admission: RE | Admit: 2021-09-03 | Discharge: 2021-09-03 | Disposition: A | Discharge status: Home / Self Care | Payer: Medicare HMO | Source: Ambulatory Visit | Attending: Physician Assistant | Admitting: Physician Assistant

## 2021-09-03 DIAGNOSIS — R6889 Other general symptoms and signs: Secondary | ICD-10-CM | POA: Diagnosis not present

## 2021-09-03 DIAGNOSIS — H0520 Unspecified exophthalmos: Secondary | ICD-10-CM | POA: Diagnosis not present

## 2021-09-03 DIAGNOSIS — H532 Diplopia: Secondary | ICD-10-CM | POA: Diagnosis not present

## 2021-09-03 DIAGNOSIS — H05243 Constant exophthalmos, bilateral: Secondary | ICD-10-CM

## 2021-09-05 ENCOUNTER — Telehealth: Payer: Self-pay

## 2021-09-05 NOTE — Telephone Encounter (Signed)
Pt called stating he would like Saguier's opinion on the CT he had on 09/03/2021. CT was ordered by R. Renato Battles, PA-C

## 2021-09-05 NOTE — Telephone Encounter (Signed)
Patient has not talked with the ordering providers office yet.  They wanted him come in.  He has an appt scheduled for the 30th (which is a Sunday).  I advised him that the recommendations should come from there office since they ordered it and also advised he can call their office to request a sooner appt if he is that concerned.  Also so he stated he will call Dr. Nathaneil Canary office to schedule a follow up appt with her.

## 2021-09-22 ENCOUNTER — Telehealth: Payer: Self-pay | Admitting: Medical

## 2021-09-22 DIAGNOSIS — H02535 Eyelid retraction left lower eyelid: Secondary | ICD-10-CM | POA: Diagnosis not present

## 2021-09-22 DIAGNOSIS — H05243 Constant exophthalmos, bilateral: Secondary | ICD-10-CM | POA: Diagnosis not present

## 2021-09-22 DIAGNOSIS — D485 Neoplasm of uncertain behavior of skin: Secondary | ICD-10-CM | POA: Diagnosis not present

## 2021-09-22 DIAGNOSIS — E038 Other specified hypothyroidism: Secondary | ICD-10-CM | POA: Diagnosis not present

## 2021-09-22 DIAGNOSIS — H02531 Eyelid retraction right upper eyelid: Secondary | ICD-10-CM | POA: Diagnosis not present

## 2021-09-22 DIAGNOSIS — H02534 Eyelid retraction left upper eyelid: Secondary | ICD-10-CM | POA: Diagnosis not present

## 2021-09-22 DIAGNOSIS — R6889 Other general symptoms and signs: Secondary | ICD-10-CM | POA: Diagnosis not present

## 2021-09-22 DIAGNOSIS — H019 Unspecified inflammation of eyelid: Secondary | ICD-10-CM | POA: Diagnosis not present

## 2021-09-22 DIAGNOSIS — H02132 Senile ectropion of right lower eyelid: Secondary | ICD-10-CM | POA: Diagnosis not present

## 2021-09-22 DIAGNOSIS — H02532 Eyelid retraction right lower eyelid: Secondary | ICD-10-CM | POA: Diagnosis not present

## 2021-09-22 NOTE — Telephone Encounter (Signed)
Labs faxed

## 2021-09-22 NOTE — Telephone Encounter (Signed)
[facility] stated they are needing tsi and tsh results faxed to [phone]. Phone number is [phone] for any questions.

## 2021-10-08 ENCOUNTER — Ambulatory Visit (INDEPENDENT_AMBULATORY_CARE_PROVIDER_SITE_OTHER): Payer: Medicare HMO | Admitting: Family Medicine

## 2021-10-08 ENCOUNTER — Encounter: Payer: Self-pay | Admitting: Family Medicine

## 2021-10-08 VITALS — BP 120/78 | HR 96 | Temp 98.2°F | Ht 75.0 in | Wt 291.0 lb

## 2021-10-08 DIAGNOSIS — R5383 Other fatigue: Secondary | ICD-10-CM

## 2021-10-08 DIAGNOSIS — L821 Other seborrheic keratosis: Secondary | ICD-10-CM

## 2021-10-08 DIAGNOSIS — E063 Autoimmune thyroiditis: Secondary | ICD-10-CM | POA: Diagnosis not present

## 2021-10-08 NOTE — Progress Notes (Signed)
Chief Complaint  Patient presents with   Follow-up    Medication questions   Fatigue    Problems with thyroid     Subjective: Patient is a 71 y.o. male here for fatigue.  Patient has a history of chronic fatigue going on for over 6 months.  He has a history of Hashimoto's thyroiditis and his regular PCP check thyroid levels around 5 months ago.  They were low and his levothyroxine was increased from 25 mcg daily up to 50 mcg daily.  He reports compliance and continued swelling, dry skin, fatigue, and inability to lose weight.  He reports a healthy diet with no significant amounts of exercise.  His mood is stable but he is frustrated with his situation.  He has been having double vision and will start a monoclonal antibody for his eyes soon.  He does have a history of OSA on CPAP.  He stopped using it to see if he can notice a difference which he did not.  He notes he does need to schedule follow-up with his sleep specialist as he denied a sleep study in many years.  Of note, he also has dark and raised spots on his torso.  He will sometimes catch on his close and be irritated.  No pain, drainage, or itching otherwise.  No new lotions, soaps, topicals, or detergents.  He has not tried anything so far.  No significant change with these lesions.  Past Medical History:  Diagnosis Date   Atrial fibrillation ([place])    Cataract    Diabetes mellitus without complication ([place])    Hypertension    Seizures ([place])    Thought to be related to something his GF gave him    Objective: BP 120/78   Pulse 96   Temp 98.2 F (36.8 C) (Oral)   Ht '6\' 3"'$  (1.905 m)   Wt 291 lb (132 kg)   SpO2 98%   BMI 36.37 kg/m  General: Awake, appears stated age Heart: RRR, 1+ pitting bl LE edema Lungs: CTAB, no rales, wheezes or rhonchi. No accessory muscle use Skin: Venous stasis changes/erythema b/l LE's various SK's on torso Neck: Supple, symmetric, thyroid is not enlarged but it hard, no ttp Psych: Age  appropriate judgment and insight, normal affect and mood  Assessment and Plan: Fatigue, unspecified type - Plan: CBC, Comprehensive metabolic panel  Hashimoto's thyroiditis - Plan: TSH, T4, free  Seborrheic keratoses  Mood stable. Might have uncontrolled OSA, needs to call sleep team. Will ck thyroid function as he was hypo last visit. Counseled on diet/exercise. Cont levothyroxine 50 mcg/d for now. Reassurance, one is bothersome, offered to shave in future.  The patient voiced understanding and agreement to the plan.  I spent 35 minutes with the patient discussing the above plans in addition to reviewing his chart on the same day of the visit.  [place], DO 10/08/21  4:50 PM

## 2021-10-08 NOTE — Patient Instructions (Signed)
Give Michael Whitney 2-3 business days to get the results of your labs back.   Aim to do some physical exertion for 150 minutes per week. This is typically divided into 5 days per week, 30 minutes per day. The activity should be enough to get your heart rate up. Anything is better than nothing if you have time constraints.  Keep the diet clean and stay active.  Reach out to your sleep specialist as I think this could be playing a role.   Let Michael Whitney know if you need anything.

## 2021-10-09 LAB — COMPREHENSIVE METABOLIC PANEL
ALT: 26 U/L (ref 0–53)
AST: 24 U/L (ref 0–37)
Albumin: 4.7 g/dL (ref 3.5–5.2)
Alkaline Phosphatase: 48 U/L (ref 39–117)
BUN: 20 mg/dL (ref 6–23)
CO2: 25 mEq/L (ref 19–32)
Calcium: 9.6 mg/dL (ref 8.4–10.5)
Chloride: 102 mEq/L (ref 96–112)
Creatinine, Ser: 1.17 mg/dL (ref 0.40–1.50)
GFR: 61.97 mL/min (ref >60.00)
Glucose, Bld: 104 mg/dL — ABNORMAL HIGH (ref 70–99)
Potassium: 4.5 mEq/L (ref 3.5–5.1)
Sodium: 141 mEq/L (ref 135–145)
Total Bilirubin: 0.7 mg/dL (ref 0.2–1.2)
Total Protein: 7 g/dL (ref 6.0–8.3)

## 2021-10-09 LAB — T4, FREE: Free T4: 0.76 ng/dL (ref 0.60–1.60)

## 2021-10-09 LAB — CBC
HCT: 50.8 % (ref 39.0–52.0)
Hemoglobin: 16.9 g/dL (ref 13.0–17.0)
MCHC: 33.2 g/dL (ref 30.0–36.0)
MCV: 99.8 fl (ref 78.0–100.0)
Platelets: 161 10*3/uL (ref 150.0–400.0)
RBC: 5.09 Mil/uL (ref 4.22–5.81)
RDW: 14.3 % (ref 11.5–15.5)
WBC: 7.3 10*3/uL (ref 4.0–10.5)

## 2021-10-09 LAB — TSH: TSH: 1.23 u[IU]/mL (ref 0.35–5.50)

## 2021-10-16 DIAGNOSIS — E0500 Thyrotoxicosis with diffuse goiter without thyrotoxic crisis or storm: Secondary | ICD-10-CM | POA: Diagnosis not present

## 2021-10-16 DIAGNOSIS — R6889 Other general symptoms and signs: Secondary | ICD-10-CM | POA: Diagnosis not present

## 2021-10-28 ENCOUNTER — Ambulatory Visit (INDEPENDENT_AMBULATORY_CARE_PROVIDER_SITE_OTHER): Payer: Medicare HMO | Admitting: Family Medicine

## 2021-10-28 ENCOUNTER — Telehealth: Payer: Self-pay | Admitting: Medical

## 2021-10-28 ENCOUNTER — Encounter: Payer: Self-pay | Admitting: Family Medicine

## 2021-10-28 VITALS — BP 142/80 | HR 68 | Temp 97.6°F | Ht 75.0 in | Wt 288.6 lb

## 2021-10-28 DIAGNOSIS — E063 Autoimmune thyroiditis: Secondary | ICD-10-CM

## 2021-10-28 DIAGNOSIS — R21 Rash and other nonspecific skin eruption: Secondary | ICD-10-CM | POA: Diagnosis not present

## 2021-10-28 DIAGNOSIS — I10 Essential (primary) hypertension: Secondary | ICD-10-CM

## 2021-10-28 DIAGNOSIS — I25118 Atherosclerotic heart disease of native coronary artery with other forms of angina pectoris: Secondary | ICD-10-CM

## 2021-10-28 DIAGNOSIS — E119 Type 2 diabetes mellitus without complications: Secondary | ICD-10-CM | POA: Diagnosis not present

## 2021-10-28 DIAGNOSIS — E890 Postprocedural hypothyroidism: Secondary | ICD-10-CM | POA: Diagnosis not present

## 2021-10-28 DIAGNOSIS — R5383 Other fatigue: Secondary | ICD-10-CM

## 2021-10-28 DIAGNOSIS — E785 Hyperlipidemia, unspecified: Secondary | ICD-10-CM | POA: Diagnosis not present

## 2021-10-28 DIAGNOSIS — I4819 Other persistent atrial fibrillation: Secondary | ICD-10-CM | POA: Diagnosis not present

## 2021-10-28 DIAGNOSIS — R319 Hematuria, unspecified: Secondary | ICD-10-CM | POA: Diagnosis not present

## 2021-10-28 LAB — POC URINALSYSI DIPSTICK (AUTOMATED)
Bilirubin, UA: NEGATIVE
Glucose, UA: NEGATIVE
Ketones, UA: NEGATIVE
Leukocytes, UA: NEGATIVE
Nitrite, UA: NEGATIVE
Protein, UA: NEGATIVE
Spec Grav, UA: 1.015 (ref 1.010–1.025)
Urobilinogen, UA: 0.2 E.U./dL
pH, UA: 5 (ref 5.0–8.0)

## 2021-10-28 NOTE — Telephone Encounter (Signed)
Nurse Assessment Nurse: Ellery Plunk, RN, Dan[place] Date/Time (Eastern Time): 10/28/2021 10:41:36 AM Confirm and document reason for call. If symptomatic, describe symptoms. ---Caller states he is feeling weak and unsteady on his feet, states he is always in a. fib. has been going on for a long time now but it is getting worse and worse. has been in the hospital a couple of times. has hashimotos and thyroid problems. yesterday and this morning is hard for him to even turn his head, voice is hoarse this morning Does the patient have any new or worsening symptoms? ---Yes Will a triage be completed? ---Yes Related visit to physician within the last 2 weeks? ---Yes Does the PT have any chronic conditions? (i.e. diabetes, asthma, this includes High risk factors for pregnancy, etc.) ---Yes List chronic conditions. ---sleep apnea, thyroid, hashimotos Is this a behavioral health or substance abuse call? ---No Guidelines Guideline Title Affirmed Question Affirmed Notes Nurse Date/Time (Eastern Time) Weakness (Generalized) and Fatigue [1] MODERATE weakness (i.e., interferes with work, school, normal activities) AND [2] persists > 3 days Bringas, RN, Danica 10/28/2021 10:44:51 AM PLEASE NOTE: All timestamps contained within this report are represented as [place] Standard Time. CONFIDENTIALTY NOTICE: This fax transmission is intended only for the addressee. It contains information that is legally privileged, confidential or otherwise protected from use or disclosure. If you are not the intended recipient, you are strictly prohibited from reviewing, disclosing, copying using or disseminating any of this information or taking any action in reliance on or regarding this information. If you have received this fax in error, please notify us immediately by telephone so that we can arrange for its return to [place]. Phone: [phone], Toll-Free: [phone], Fax: [phone] Page: 2 of 2 Call Id:  [id number] [place]. Time Eilene Ghazi Time) Disposition Final User 10/28/2021 10:59:59 AM See PCP within 24 Hours Yes Bringas, RN, Fredric Dine Final Disposition 10/28/2021 10:59:59 AM See PCP within 24 Hours Yes Bringas, RN, Fredric Dine Caller Disagree/Comply Comply Caller Understands Yes PreDisposition Did not know what to do Care Advice Given Per Guideline SEE PCP WITHIN 24 HOURS: * IF OFFICE WILL BE OPEN: You need to be examined within the next 24 hours. Call your doctor (or NP/PA) when the office opens and make an appointment. CALL BACK IF: * You become worse CARE ADVICE given per Weakness and Fatigue (Adult) guideline. Comments User: Harriette Bouillon, RN Date/Time Eilene Ghazi Time): 10/28/2021 10:45:17 AM bg 190, BP 133/70, HR 159 Referrals REFERRED TO PCP OFFICE

## 2021-10-28 NOTE — Progress Notes (Signed)
Established Patient Office Visit  Subjective   Patient ID: Michael Whitney, male    DOB: February 10, 1951  Age: 71 y.o. MRN: [id number]  Chief Complaint  Patient presents with   Extremity Weakness    Ongoing concern     HPI Pt is here with a friend con't to c/o fatigue and weakness.  He is very frustrated that he can not get help with his hashimotos .  He has seen 3 endocrine drs, a neuro  has had several scans and tests.   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/25/2021   Elevated troponin 12/12/2020   CAD (coronary artery disease) 12/12/2020   Permanent atrial fibrillation ([place]) 12/12/2020   Hyperlipidemia 12/12/2020   NSTEMI (non-ST elevated myocardial infarction) ([place]) 12/11/2020   Chest pain of uncertain etiology 59/56/3875   Chronic diastolic CHF (congestive heart failure) ([place]) 10/24/2020   Labyrinthitis 05/01/2019   Meniere's disease 05/01/2019   PVC (premature ventricular contraction) 07/14/2017   Type 2 diabetes mellitus with complication, without long-term current use of insulin ([place]) 06/29/2017   Persistent atrial fibrillation ([place]) 06/17/2017   Neck pain 12/04/2016   Severe obesity (BMI 35.0-35.9 with comorbidity) ([place]) 09/06/2015   Essential hypertension 02/07/2015   Cataract 02/07/2015   Allergic rhinitis 02/07/2015   COPD (chronic obstructive pulmonary disease) ([place]) 02/07/2015   Hyperthyroidism 02/07/2015   Impaired fasting glucose 02/07/2015   Sensorineural hearing loss (SNHL), bilateral 02/07/2015   Simple chronic bronchitis ([place]) 02/07/2015   Varicose veins of both lower extremities 02/07/2015   Hypertension 02/07/2015   Alcohol abuse 01/22/2015   Cognitive and behavioral changes 01/22/2015   Seizure ([place]) 01/22/2015   Spell of loss of consciousness 11/17/2012   Abnormal EEG 11/05/2012   Syncope 11/05/2012   Thrombocytopenia ([place]) 11/05/2012   Anxiety 11/03/2012   Chronic  dizziness 11/03/2012   Past Medical History:  Diagnosis Date   Atrial fibrillation ([place])    Cataract    Diabetes mellitus without complication ([place])    Hypertension    Seizures ([place])    Thought to be related to something his GF gave him   Past Surgical History:  Procedure Laterality Date   CATARACT EXTRACTION     PVC ABLATION N/A 07/14/2017   Procedure: PVC ABLATION;  Surgeon: Evans Lance, MD;  Location: [facility];  Service: Cardiovascular;  Laterality: N/A;   RIGHT/LEFT HEART CATH AND CORONARY ANGIOGRAPHY N/A 10/24/2020   Procedure: RIGHT/LEFT HEART CATH AND CORONARY ANGIOGRAPHY;  Surgeon: [name], Peter M, MD;  Location: [facility];  Service: Cardiovascular;  Laterality: N/A;   TONSILLECTOMY     VARICOSE VEIN SURGERY     Social History   Tobacco Use   Smoking status: Never   Smokeless tobacco: Never  Vaping Use   Vaping Use: Never used  Substance Use Topics   Alcohol use: Yes    Alcohol/week: 2.0 standard drinks of alcohol    Types: 1 Shots of liquor, 1 Cans of beer per week    Comment: occasionally   Drug use: No   Social History   Socioeconomic History   Marital status: Widowed    Spouse name: Not on file   Number of children: 2   Years of education: Not on file   Highest education level: Not on file  Occupational History   Occupation: retired    Comment: Geophysicist/field seismologist  Tobacco Use   Smoking status: Never   Smokeless  tobacco: Never  Vaping Use   Vaping Use: Never used  Substance and Sexual Activity   Alcohol use: Yes    Alcohol/week: 2.0 standard drinks of alcohol    Types: 1 Shots of liquor, 1 Cans of beer per week    Comment: occasionally   Drug use: No   Sexual activity: Not on file  Other Topics Concern   Not on file  Social History Narrative   Not on file   Social Determinants of Health   Financial Resource Strain: Low Risk  (08/18/2021)   Overall Financial Resource Strain (CARDIA)    Difficulty of Paying Living Expenses: Not  hard at all  Food Insecurity: No Food Insecurity (08/18/2021)   Hunger Vital Sign    Worried About Running Out of Food in the Last Year: Never true    Ran Out of Food in the Last Year: Never true  Transportation Needs: No Transportation Needs (08/18/2021)   PRAPARE - Hydrologist (Medical): No    Lack of Transportation (Non-Medical): No  Physical Activity: Sufficiently Active (08/18/2021)   Exercise Vital Sign    Days of Exercise per Week: 4 days    Minutes of Exercise per Session: 60 min  Stress: No Stress Concern Present (08/18/2021)   [place]    Feeling of Stress : Not at all  Social Connections: Socially Isolated (08/18/2021)   Social Connection and Isolation Panel [NHANES]    Frequency of Communication with Friends and Family: More than three times a week    Frequency of Social Gatherings with Friends and Family: Twice a week    Attends Religious Services: Never    Marine scientist or Organizations: No    Attends Archivist Meetings: Never    Marital Status: Widowed  Intimate Partner Violence: Not At Risk (08/18/2021)   Humiliation, Afraid, Rape, and Kick questionnaire    Fear of Current or Ex-Partner: No    Emotionally Abused: No    Physically Abused: No    Sexually Abused: No   Family Status  Relation Name Status   Mother  Deceased   Father  Deceased   MGM  Deceased   MGF  Deceased   PGM  Deceased   PGF  Deceased   Family History  Problem Relation Age of Onset   CAD Mother        MI at age 91   No Known Allergies    ROS    Objective:     BP (!) 142/80   Pulse 68   Temp 97.6 F (36.4 C) (Oral)   Ht '6\' 3"'$  (1.905 m)   Wt 288 lb 9.6 oz (130.9 kg)   SpO2 98%   BMI 36.07 kg/m  BP Readings from Last 3 Encounters:  10/28/21 (!) 142/80  10/08/21 120/78  06/18/21 (!) 142/80   Wt Readings from Last 3 Encounters:  10/28/21 288 lb 9.6 oz (130.9 kg)   10/08/21 291 lb (132 kg)  08/18/21 297 lb (134.7 kg)   SpO2 Readings from Last 3 Encounters:  10/28/21 98%  10/08/21 98%  06/18/21 94%      Physical Exam   Results for orders placed or performed in visit on 10/28/21  POCT Urinalysis Dipstick (Automated)  Result Value Ref Range   Color, UA yellow    Clarity, UA cloudy    Glucose, UA Negative Negative   Bilirubin, UA Negative    Ketones,  UA Negative    Spec Grav, UA 1.015 1.010 - 1.025   Blood, UA Moderate    pH, UA 5.0 5.0 - 8.0   Protein, UA Negative Negative   Urobilinogen, UA 0.2 0.2 or 1.0 E.U./dL   Nitrite, UA Negative    Leukocytes, UA Negative Negative    Last CBC Lab Results  Component Value Date   WBC 7.3 10/08/2021   HGB 16.9 10/08/2021   HCT 50.8 10/08/2021   MCV 99.8 10/08/2021   MCH 33.1 12/11/2020   RDW 14.3 10/08/2021   PLT 161.0 41/96/2229   Last metabolic panel Lab Results  Component Value Date   GLUCOSE 104 (H) 10/08/2021   NA 141 10/08/2021   K 4.5 10/08/2021   CL 102 10/08/2021   CO2 25 10/08/2021   BUN 20 10/08/2021   CREATININE 1.17 10/08/2021   GFRNONAA 55 (L) 12/12/2020   CALCIUM 9.6 10/08/2021   PROT 7.0 10/08/2021   ALBUMIN 4.7 10/08/2021   BILITOT 0.7 10/08/2021   ALKPHOS 48 10/08/2021   AST 24 10/08/2021   ALT 26 10/08/2021   ANIONGAP 7 12/12/2020   Last lipids Lab Results  Component Value Date   CHOL 226 (H) 11/29/2020   HDL 41.00 11/29/2020   LDLCALC 153 (H) 11/29/2020   TRIG 156.0 (H) 11/29/2020   CHOLHDL 6 11/29/2020   Last hemoglobin A1c Lab Results  Component Value Date   HGBA1C 6.7 (H) 11/29/2020   Last thyroid functions Lab Results  Component Value Date   TSH 1.23 10/08/2021   Last vitamin D No results found for: "25OHVITD2", "25OHVITD3", "VD25OH" Last vitamin B12 and Folate No results found for: "VITAMINB12", "FOLATE"    The ASCVD Risk score (Arnett DK, et al., 2019) failed to calculate for the following reasons:   The patient has a prior MI  or stroke diagnosis    Assessment & Plan:   Problem List Items Addressed This Visit       Unprioritized   Essential hypertension   Relevant Orders   Hemoglobin A1c   Lipid panel   POCT Urinalysis Dipstick (Automated) (Completed)   Postablative hypothyroidism    Check labs       Persistent atrial fibrillation [facility])    Per cardiology      Hyperlipidemia    Tolerating statin, encouraged heart healthy diet, avoid trans fats, minimize simple carbs and saturated fats. Increase exercise as tolerated      Relevant Orders   Hemoglobin A1c   Lipid panel   POCT Urinalysis Dipstick (Automated) (Completed)   Hashimoto's thyroiditis - Primary    Check labs and consider new referral to endo Dr Cy Blamer is treating thyroid eye disease       Relevant Orders   Antinuclear Antib (ANA)   CBC with Differential/Platelet   Rheumatoid Factor   Sedimentation rate   Thyroid Panel With TSH   Thyroid peroxidase antibody   Vitamin B12   VITAMIN D 25 Hydroxy (Vit-D Deficiency, Fractures)   CAD (coronary artery disease)    Check lab s      Other Visit Diagnoses     Rash       Relevant Orders   Antinuclear Antib (ANA)   CBC with Differential/Platelet   Rheumatoid Factor   Sedimentation rate   Vitamin B12   VITAMIN D 25 Hydroxy (Vit-D Deficiency, Fractures)   Other fatigue       Relevant Orders   Antinuclear Antib (ANA)   CBC with Differential/Platelet   Hemoglobin A1c  Lipid panel   POCT Urinalysis Dipstick (Automated) (Completed)   Rheumatoid Factor   Sedimentation rate   Vitamin B12   VITAMIN D 25 Hydroxy (Vit-D Deficiency, Fractures)   Type 2 diabetes mellitus without complication, without long-term current use of insulin (HCC)       Relevant Orders   Hemoglobin A1c   Lipid panel   POCT Urinalysis Dipstick (Automated) (Completed)   Hematuria, unspecified type       Relevant Orders   Urine Culture       Return in about 3 weeks (around 11/18/2021), or if symptoms  worsen or fail to improve.    Ann Held, DO

## 2021-10-28 NOTE — Assessment & Plan Note (Signed)
Well controlled, no changes to meds. Encouraged heart healthy diet such as the DASH diet and exercise as tolerated.  °

## 2021-10-28 NOTE — Assessment & Plan Note (Signed)
Check labs 

## 2021-10-28 NOTE — Patient Instructions (Signed)
Antithyroglobulin Antibody Test Why am I having this test? Your health care provider may order an antithyroglobulin antibody test to help diagnose a number of autoimmune thyroid conditions and other related diseases. What is being tested? This test checks for the presence of antibodies that form against the thyroid cells of your body (autoantibodies). Thyroglobulin is a hormone that normally stays in the thyroid gland and is important in the activity of other thyroid hormones. Inflammation of the thyroid gland can result in leakage of thyroglobulin into the bloodstream. This causes autoantibodies to thyroglobulin to form in some people. What kind of sample is taken?  A blood sample is required for this test. It is usually collected by inserting a needle into a blood vessel. How are the results reported? Your test results will be reported as a value. Your health care provider will compare your results to normal ranges that were established after testing a large group of people (reference ranges). Reference ranges may vary among labs and hospitals. For this test, a common reference range is: 0-116 international units/mL. What do the results mean? Increased levels of antibodies may mean that you have any of these conditions: Hashimoto's thyroiditis. Graves' disease. Rheumatoid arthritis. Hypothyroidism. Thyroid cancer. Certain types of anemia. Talk with your health care provider about what your results mean. Questions to ask your health care provider Ask your health care provider, or the department that is doing the test: When will my results be ready? How will I get my results? What are my treatment options? What other tests do I need? What are my next steps? Summary An antithyroglobulin antibody test may be used to help diagnose a number of autoimmune thyroid conditions and other related diseases. Increased levels of antibodies can be seen in thyroid conditions such as Hashimoto's  thyroiditis and hypothyroidism. Talk with your health care provider about what your results mean. This information is not intended to replace advice given to you by your health care provider. Make sure you discuss any questions you have with your health care provider. Document Revised: 02/05/2021 Document Reviewed: 02/05/2021 Elsevier Patient Education  [place].

## 2021-10-28 NOTE — Telephone Encounter (Signed)
Pt called stating he was experiencing the following symptoms:  -Weak  -Issues with standing  -High BS - 190 on 9.5.23  -Dizziness  Pt was transferred to triage nurse for further eval.

## 2021-10-28 NOTE — Telephone Encounter (Signed)
FYI   Pt is also seeing Dr.Lowne today at 6 pm

## 2021-10-28 NOTE — Assessment & Plan Note (Signed)
Tolerating statin, encouraged heart healthy diet, avoid trans fats, minimize simple carbs and saturated fats. Increase exercise as tolerated 

## 2021-10-28 NOTE — Assessment & Plan Note (Signed)
Per cardiology 

## 2021-10-28 NOTE — Assessment & Plan Note (Signed)
Check labs and consider new referral to endo Dr Cy Blamer is treating thyroid eye disease

## 2021-10-29 ENCOUNTER — Other Ambulatory Visit (INDEPENDENT_AMBULATORY_CARE_PROVIDER_SITE_OTHER): Payer: Medicare HMO

## 2021-10-29 DIAGNOSIS — E119 Type 2 diabetes mellitus without complications: Secondary | ICD-10-CM | POA: Diagnosis not present

## 2021-10-29 DIAGNOSIS — R5383 Other fatigue: Secondary | ICD-10-CM | POA: Diagnosis not present

## 2021-10-29 DIAGNOSIS — E063 Autoimmune thyroiditis: Secondary | ICD-10-CM

## 2021-10-29 DIAGNOSIS — R21 Rash and other nonspecific skin eruption: Secondary | ICD-10-CM | POA: Diagnosis not present

## 2021-10-29 DIAGNOSIS — E785 Hyperlipidemia, unspecified: Secondary | ICD-10-CM

## 2021-10-29 DIAGNOSIS — I10 Essential (primary) hypertension: Secondary | ICD-10-CM

## 2021-10-29 DIAGNOSIS — R319 Hematuria, unspecified: Secondary | ICD-10-CM | POA: Diagnosis not present

## 2021-10-30 LAB — CBC WITH DIFFERENTIAL/PLATELET
Basophils Absolute: 0.1 10*3/uL (ref 0.0–0.1)
Basophils Relative: 1.3 % (ref 0.0–3.0)
Eosinophils Absolute: 0.1 10*3/uL (ref 0.0–0.7)
Eosinophils Relative: 1.5 % (ref 0.0–5.0)
HCT: 50.7 % (ref 39.0–52.0)
Hemoglobin: 16.9 g/dL (ref 13.0–17.0)
Lymphocytes Relative: 28.4 % (ref 12.0–46.0)
Lymphs Abs: 1.8 10*3/uL (ref 0.7–4.0)
MCHC: 33.3 g/dL (ref 30.0–36.0)
MCV: 100.2 fl — ABNORMAL HIGH (ref 78.0–100.0)
Monocytes Absolute: 0.5 10*3/uL (ref 0.1–1.0)
Monocytes Relative: 7.4 % (ref 3.0–12.0)
Neutro Abs: 3.9 10*3/uL (ref 1.4–7.7)
Neutrophils Relative %: 61.4 % (ref 43.0–77.0)
Platelets: 134 10*3/uL — ABNORMAL LOW (ref 150.0–400.0)
RBC: 5.06 Mil/uL (ref 4.22–5.81)
RDW: 14 % (ref 11.5–15.5)
WBC: 6.4 10*3/uL (ref 4.0–10.5)

## 2021-10-30 LAB — LIPID PANEL
Cholesterol: 209 mg/dL — ABNORMAL HIGH (ref 0–200)
HDL: 41.1 mg/dL (ref >39.00)
LDL Cholesterol: 140 mg/dL — ABNORMAL HIGH (ref 0–99)
NonHDL: 168.03
Total CHOL/HDL Ratio: 5
Triglycerides: 138 mg/dL (ref 0.0–149.0)
VLDL: 27.6 mg/dL (ref 0.0–40.0)

## 2021-10-30 LAB — URINE CULTURE
MICRO NUMBER:: 13878986
Result:: NO GROWTH
SPECIMEN QUALITY:: ADEQUATE

## 2021-10-30 LAB — VITAMIN B12: Vitamin B-12: 552 pg/mL (ref 211–911)

## 2021-10-30 LAB — VITAMIN D 25 HYDROXY (VIT D DEFICIENCY, FRACTURES): VITD: 26.25 ng/mL — ABNORMAL LOW (ref 30.00–100.00)

## 2021-10-30 LAB — SEDIMENTATION RATE: Sed Rate: 16 mm/hr (ref 0–20)

## 2021-10-30 LAB — HEMOGLOBIN A1C: Hgb A1c MFr Bld: 7.2 % — ABNORMAL HIGH (ref 4.6–6.5)

## 2021-10-31 MED ORDER — VITAMIN D (ERGOCALCIFEROL) 1.25 MG (50000 UNIT) PO CAPS: 50000.0000 [IU] | ORAL_CAPSULE | ORAL | 12 refills | Status: AC

## 2021-10-31 NOTE — Addendum Note (Signed)
Addended by: Jeronimo Greaves on: 10/31/2021 02:42 PM   Modules accepted: Orders

## 2021-11-01 LAB — THYROID PANEL WITH TSH
Free Thyroxine Index: 2.1 (ref 1.4–3.8)
T3 Uptake: 26 % (ref 22–35)
T4, Total: 8 ug/dL (ref 4.9–10.5)
TSH: 1.03 mIU/L (ref 0.40–4.50)

## 2021-11-01 LAB — THYROID PEROXIDASE ANTIBODY: Thyroperoxidase Ab SerPl-aCnc: 122 IU/mL — ABNORMAL HIGH (ref <9)

## 2021-11-01 LAB — RHEUMATOID FACTOR: Rheumatoid fact SerPl-aCnc: <14 IU/mL (ref <14)

## 2021-11-01 LAB — ANA: Anti Nuclear Antibody (ANA): NEGATIVE

## 2021-11-06 DIAGNOSIS — R6889 Other general symptoms and signs: Secondary | ICD-10-CM | POA: Diagnosis not present

## 2021-11-14 ENCOUNTER — Telehealth (INDEPENDENT_AMBULATORY_CARE_PROVIDER_SITE_OTHER): Payer: Medicare HMO | Admitting: Medical

## 2021-11-14 VITALS — BP 122/67 | HR 61

## 2021-11-14 DIAGNOSIS — R5383 Other fatigue: Secondary | ICD-10-CM | POA: Diagnosis not present

## 2021-11-14 DIAGNOSIS — E063 Autoimmune thyroiditis: Secondary | ICD-10-CM

## 2021-11-14 DIAGNOSIS — E559 Vitamin D deficiency, unspecified: Secondary | ICD-10-CM

## 2021-11-14 DIAGNOSIS — E119 Type 2 diabetes mellitus without complications: Secondary | ICD-10-CM | POA: Diagnosis not present

## 2021-11-14 DIAGNOSIS — E538 Deficiency of other specified B group vitamins: Secondary | ICD-10-CM | POA: Diagnosis not present

## 2021-11-14 DIAGNOSIS — G4739 Other sleep apnea: Secondary | ICD-10-CM | POA: Diagnosis not present

## 2021-11-14 DIAGNOSIS — I10 Essential (primary) hypertension: Secondary | ICD-10-CM | POA: Diagnosis not present

## 2021-11-14 DIAGNOSIS — I4821 Permanent atrial fibrillation: Secondary | ICD-10-CM

## 2021-11-14 DIAGNOSIS — I5032 Chronic diastolic (congestive) heart failure: Secondary | ICD-10-CM

## 2021-11-14 NOTE — Patient Instructions (Addendum)
Chronic daily dizziness/off balance and fatigue but unfortunately extensive evaluation from various specialists has not found cause.  Most recently thyroid disease considered possible major contributing factor.  Patient has started to pass but only has gotten one treatment of tepezza.  Specialist thinks that it might take for treatments before might begin to notice difference.  Hypothyroid and has follow-up with endocrinologist hopefully in October.  Diabetes with A1c of 7.2 recently.  With diabetes and desire to lose weight Ozempic might be reasonable option.  Would asked that you discuss this with endocrinologist.  I don't think you have contraindications for ozempic use.   History of coronary artery disease chart review and cardiac catheterization did show some mild stenosis.  With your overall risk factors low-dose atorvastatin might be indicated.  He has upcoming appoint with cardiologist I want you to discuss this with him.  Atrial fibrillation-today your pulse rate is controlled.  Upcoming cardiology appointment and you mention they would get echocardiogram.  Continue Xarelto.  50 if you can check pulse rate with your O2 sat monitor.  Pulse rates significantly higher than 100 or lower than 60 can play a role in dizziness.  Hypertension, blood pressures well controlled.  I discussed for you to make note if dizzy events occur on standing.  Sometimes postural hypotension can play a role in dizziness.  Vitamin D recently low.  This could be a factor in your fatigue.  You are now on supplementation.  Placed future lab vitamin D to get checked in 4 weeks.  B12 in normal range normal.  Currently using 1000 mcg daily.  On 2 of the days of the week and use 2000 mcg daily.  Repeat B12 level in 4 weeks as well.  Okay with your B12 being in the upper high range but do not want to be above the upper range.  Follow-up date to be determined after lab review in 4 weeks.  Sooner if needed.  Note also as a  reminder in light of his constant dizzy sensation and fatigue we did discuss for him to watch himself closely.  If he has any severe headache associated with the dizziness or any motor or sensory function deficits then be seen in emergency department for imaging.  Patient expresses understanding.

## 2021-11-14 NOTE — Progress Notes (Signed)
Subjective:    Patient ID: Michael Whitney, male    DOB: 1950-12-01, 71 y.o.   MRN: [id number]  HPI   Virtual Visit via Video Note  I connected with [name] on 11/14/21 at  2:40 PM EDT by a video enabled telemedicine application and verified that I am speaking with the correct person using two identifiers.  Location: Patient: home Provider: office   I discussed the limitations of evaluation and management by telemedicine and the availability of in person appointments. The patient expressed understanding and agreed to proceed.  History of Present Illness:   Chronic light headed sensation and dizziness. This has been case for years without any defintintive cause found on extensive work ups.    Low vit d 50,000 weekly version as well and 1000 iu daily as well.  Pt last a1c was 7.2. Pt noticed in morning sugars can be higher in morings. Was 200 this morning usually is lower. Most of time 120-140. Occasional higher such as 178.  Pt has hypothyroid- he is on levothyroxine 50 mcg. Pt has seen endocrinologist in past.   Pt b12 level was in mid range normal.  Pt has thyroid eye disease. He is getting tapeza  High cholesterol- on fish oil   Hx atrial fibrillation- pulse today 61. O2 sat 97%. Patient is on xarelto.   Htn-bp well controlled. Losartan and amlodipine.     Observations/Objective:  General-no acute distress, pleasant, oriented. Lungs- on inspection lungs appear unlabored. Neck- no tracheal deviation or jvd on inspection. Neuro- gross motor function appears intact. Finger to nose intact.  Full range of motion upper extremities.  No hand drift.      Assessment and Plan:  Patient Instructions  Chronic daily dizziness/off balance and fatigue but unfortunately extensive evaluation from various specialists has not found cause.  Most recently thyroid disease considered possible major contributing factor.  Patient has started to pass but only has gotten one  treatment of tepezza.  Specialist thinks that it might take for treatments before might begin to notice difference.  Hypothyroid and has follow-up with endocrinologist hopefully in October.  Diabetes with A1c of 7.2 recently.  With diabetes and desire to lose weight Ozempic might be reasonable option.  Would asked that you discuss this with endocrinologist.  I don't think you have contraindications for ozempic use.   History of coronary artery disease chart review and cardiac catheterization did show some mild stenosis.  With your overall risk factors low-dose atorvastatin might be indicated.  He has upcoming appoint with cardiologist I want you to discuss this with him.  Atrial fibrillation-today your pulse rate is controlled.  Upcoming cardiology appointment and you mention they would get echocardiogram.  Continue Xarelto.  50 if you can check pulse rate with your O2 sat monitor.  Pulse rates significantly higher than 100 or lower than 60 can play a role in dizziness.  Hypertension, blood pressures well controlled.  I discussed for you to make note if dizzy events occur on standing.  Sometimes postural hypotension can play a role in dizziness.  Vitamin D recently low.  This could be a factor in your fatigue.  You are now on supplementation.  Placed future lab vitamin D to get checked in 4 weeks.  B12 in normal range normal.  Currently using 1000 mcg daily.  On 2 of the days of the week and use 2000 mcg daily.  Repeat B12 level in 4 weeks as well.  Okay with your B12  being in the upper high range but do not want to be above the upper range.  Follow-up date to be determined after lab review in 4 weeks.  Sooner if needed.       Mackie Pai, PA-C   Time spent with patient today was 45  minutes which consisted of chart revdiew, discussing diagnosis, work up treatment and documentation.  Follow Up Instructions:    I discussed the assessment and treatment plan with the patient. The patient  was provided an opportunity to ask questions and all were answered. The patient agreed with the plan and demonstrated an understanding of the instructions.   The patient was advised to call back or seek an in-person evaluation if the symptoms worsen or if the condition fails to improve as anticipated.     Mackie Pai, PA-C     Review of Systems     Objective:   Physical Exam        Assessment & Plan:

## 2021-11-25 ENCOUNTER — Other Ambulatory Visit: Payer: Medicare HMO

## 2021-11-25 ENCOUNTER — Telehealth (INDEPENDENT_AMBULATORY_CARE_PROVIDER_SITE_OTHER): Payer: Medicare HMO | Admitting: Medical

## 2021-11-25 ENCOUNTER — Encounter: Payer: Self-pay | Admitting: Medical

## 2021-11-25 VITALS — BP 127/69 | HR 52

## 2021-11-25 DIAGNOSIS — R5383 Other fatigue: Secondary | ICD-10-CM

## 2021-11-25 DIAGNOSIS — M2550 Pain in unspecified joint: Secondary | ICD-10-CM

## 2021-11-25 DIAGNOSIS — H9313 Tinnitus, bilateral: Secondary | ICD-10-CM | POA: Diagnosis not present

## 2021-11-25 DIAGNOSIS — M25512 Pain in left shoulder: Secondary | ICD-10-CM

## 2021-11-25 DIAGNOSIS — R42 Dizziness and giddiness: Secondary | ICD-10-CM

## 2021-11-25 DIAGNOSIS — M25511 Pain in right shoulder: Secondary | ICD-10-CM

## 2021-11-25 DIAGNOSIS — M791 Myalgia, unspecified site: Secondary | ICD-10-CM | POA: Diagnosis not present

## 2021-11-25 MED ORDER — SITAGLIPTIN PHOSPHATE 25 MG PO TABS: 25.0000 mg | ORAL_TABLET | Freq: Every day | ORAL | 3 refills | Status: DC

## 2021-11-25 MED ORDER — AZELASTINE HCL 0.1 % NA SOLN: 2.0000 | Freq: Two times a day (BID) | NASAL | 0 refills | Status: AC

## 2021-11-25 NOTE — Progress Notes (Signed)
Subjective:    Patient ID: Michael Whitney, male    DOB: [birth year], 71 y.o.   MRN: [id number]  HPI  Virtual Visit via Video Note  I connected with Michael Whitney on 11/25/21 at  9:20 AM EDT by a video enabled telemedicine application and verified that I am speaking with the correct person using two identifiers.  Location: Patient: home Provider: office   I discussed the limitations of evaluation and management by telemedicine and the availability of in person appointments. The patient expressed understanding and agreed to proceed.  History of Present Illness:   Pt states on Friday night he had bilateral onset of shoulder pain and left hip pain.   Pt states his shoulders and left hip were sore for about 3 days. No known tick bites. Staying in doors. No rash. Pt not on statin. No recent work outs.   Both shoulder still have faint residual pain presently.  Pt states EMS came out and was evaluated. They thought muscle soreness. EMS speculated maybe side effect of tapezza. Pt seeing MD who is treating thyroid eye disease.   Pt has some recent ringing of both ears for 2 weeks.. Pt also notes pressure in both ears.  Dizziness- chronic for years. Not worse. No new signs/symptoms. See last note.  Pt sugar ws 231 today. Pt is diabetic. He has not followed up with Dr. Kelton Whitney.   Some mild nasal congestion and some faint pressure. Used flonase one dose one day the other day.     Observations/Objective: General-no acute distress, pleasant, oriented. Lungs- on inspection lungs appear unlabored. Neck- no tracheal deviation or jvd on inspection. Neuro- gross motor function appears intact.  Upper extremity-patient demonstrates full range of motion of shoulders today.  Assessment and Plan: Patient Instructions  Arthralgias and myalgias on Friday lasting into the day.  He described bilateral shoulder pain and left hip pain.  Now only having residual bilateral shoulder pain.  Barely  noticeable now.  We will get rheumatoid factor, ANA, sed rate and CK.  Also get bilateral shoulder x-rays.  Since pain almost completely gone not prescribing medication presently.  New onset tinnitus for 2 weeks.  Previous imaging studies done which did not show cause for tinnitus.  I will provide him a printout today for you to review differential diagnosis.  Presently having some nasal congestion with faint sinus pressure.  I want you to continue Flonase and add on Astelin nasal spray.  If tinnitus persists/worsens consider referral to ENT.  Chronic dizziness close to baseline.  On extensive review/work-up in the past no definitive cause found.  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  Specialist has upset his thyroid eye disease resolved this might help dizziness.  Will get opinion from specialist whether or not myalgias or arthralgias possible side effect from 2000?  Diabetes-recent blood sugar 231.  Referral to endocrinologist has been placed.  Since sugars are running high decided to go ahead and prescribe Januvia 25 mg daily.  Please follow-up with endocrinologist.  Hypothyroid-currently on levothyroxine.  Follow-up with endocrinologist for both diabetes and thyroid.   Follow-up date to be determined after lab review.  Scheduled for labs tomorrow at 1045.   Time spent with patient today was 42  minutes which consisted of chart review, discussing diagnosis, work up ,treatment, answering patient's questions and documentation.   Follow Up Instructions:    I discussed the assessment and treatment plan with the patient. The patient was provided an  opportunity to ask questions and all were answered. The patient agreed with the plan and demonstrated an understanding of the instructions.   The patient was advised to call back or seek an in-person evaluation if the symptoms worsen or if the condition fails to improve as anticipated.     Michael Pai, PA-C   Review  of Systems     Objective:   Physical Exam        Assessment & Plan:

## 2021-11-25 NOTE — Patient Instructions (Addendum)
Arthralgias and myalgias on Friday lasting into the day.  He described bilateral shoulder pain and left hip pain.  Now only having residual bilateral shoulder pain.  Barely noticeable now.  We will get rheumatoid factor, ANA, sed rate and CK.  Also get bilateral shoulder x-rays.  Since pain almost completely gone not prescribing medication presently.  New onset tinnitus for 2 weeks.  Previous imaging studies done which did not show cause for tinnitus.  I will provide him a printout today for you to review differential diagnosis.  Presently having some nasal congestion with faint sinus pressure.  I want you to continue Flonase and add on Astelin nasal spray.  If tinnitus persists/worsens consider referral to ENT.  Chronic dizziness close to baseline.  On extensive review/work-up in the past no definitive cause found.  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  Specialist has upset his thyroid eye disease resolved this might help dizziness.  Will get opinion from specialist whether or not myalgias or arthralgias possible side effect from 2000?  Diabetes-recent blood sugar 231.  Referral to endocrinologist has been placed.  Since sugars are running high decided to go ahead and prescribe Januvia 25 mg daily.  Please follow-up with endocrinologist.  Hypothyroid-currently on levothyroxine.  Follow-up with endocrinologist for both diabetes and thyroid.   Follow-up date to be determined after lab review.  Scheduled for labs tomorrow at 1045.

## 2021-11-26 ENCOUNTER — Ambulatory Visit (HOSPITAL_BASED_OUTPATIENT_CLINIC_OR_DEPARTMENT_OTHER)
Admission: RE | Admit: 2021-11-26 | Discharge: 2021-11-26 | Disposition: A | Discharge status: Home / Self Care | Payer: Medicare HMO | Source: Ambulatory Visit | Attending: Medical | Admitting: Medical

## 2021-11-26 ENCOUNTER — Other Ambulatory Visit (INDEPENDENT_AMBULATORY_CARE_PROVIDER_SITE_OTHER): Payer: Medicare HMO

## 2021-11-26 DIAGNOSIS — M2550 Pain in unspecified joint: Secondary | ICD-10-CM | POA: Diagnosis not present

## 2021-11-26 DIAGNOSIS — R5383 Other fatigue: Secondary | ICD-10-CM | POA: Diagnosis not present

## 2021-11-26 DIAGNOSIS — M19011 Primary osteoarthritis, right shoulder: Secondary | ICD-10-CM | POA: Diagnosis not present

## 2021-11-26 DIAGNOSIS — M25512 Pain in left shoulder: Secondary | ICD-10-CM | POA: Diagnosis not present

## 2021-11-26 DIAGNOSIS — M791 Myalgia, unspecified site: Secondary | ICD-10-CM

## 2021-11-26 DIAGNOSIS — M25511 Pain in right shoulder: Secondary | ICD-10-CM | POA: Insufficient documentation

## 2021-11-26 DIAGNOSIS — M19012 Primary osteoarthritis, left shoulder: Secondary | ICD-10-CM | POA: Diagnosis not present

## 2021-11-26 LAB — CBC WITH DIFFERENTIAL/PLATELET
Basophils Absolute: 0.1 10*3/uL (ref 0.0–0.1)
Basophils Relative: 1.1 % (ref 0.0–3.0)
Eosinophils Absolute: 0.1 10*3/uL (ref 0.0–0.7)
Eosinophils Relative: 1.4 % (ref 0.0–5.0)
HCT: 48.8 % (ref 39.0–52.0)
Hemoglobin: 16.3 g/dL (ref 13.0–17.0)
Lymphocytes Relative: 30.6 % (ref 12.0–46.0)
Lymphs Abs: 1.5 10*3/uL (ref 0.7–4.0)
MCHC: 33.3 g/dL (ref 30.0–36.0)
MCV: 99.6 fl (ref 78.0–100.0)
Monocytes Absolute: 0.4 10*3/uL (ref 0.1–1.0)
Monocytes Relative: 7.7 % (ref 3.0–12.0)
Neutro Abs: 2.9 10*3/uL (ref 1.4–7.7)
Neutrophils Relative %: 59.2 % (ref 43.0–77.0)
Platelets: 134 10*3/uL — ABNORMAL LOW (ref 150.0–400.0)
RBC: 4.9 Mil/uL (ref 4.22–5.81)
RDW: 14.5 % (ref 11.5–15.5)
WBC: 5 10*3/uL (ref 4.0–10.5)

## 2021-11-26 LAB — C-REACTIVE PROTEIN: CRP: <1 mg/dL (ref 0.5–20.0)

## 2021-11-26 LAB — COMPREHENSIVE METABOLIC PANEL
ALT: 20 U/L (ref 0–53)
AST: 18 U/L (ref 0–37)
Albumin: 4.5 g/dL (ref 3.5–5.2)
Alkaline Phosphatase: 40 U/L (ref 39–117)
BUN: 25 mg/dL — ABNORMAL HIGH (ref 6–23)
CO2: 26 mEq/L (ref 19–32)
Calcium: 9.9 mg/dL (ref 8.4–10.5)
Chloride: 100 mEq/L (ref 96–112)
Creatinine, Ser: 1.28 mg/dL (ref 0.40–1.50)
GFR: 55.58 mL/min — ABNORMAL LOW (ref >60.00)
Glucose, Bld: 292 mg/dL — ABNORMAL HIGH (ref 70–99)
Potassium: 4.5 mEq/L (ref 3.5–5.1)
Sodium: 136 mEq/L (ref 135–145)
Total Bilirubin: 1.1 mg/dL (ref 0.2–1.2)
Total Protein: 6.7 g/dL (ref 6.0–8.3)

## 2021-11-26 LAB — SEDIMENTATION RATE: Sed Rate: 11 mm/hr (ref 0–20)

## 2021-11-26 LAB — CK: Total CK: 164 U/L (ref 7–232)

## 2021-11-26 NOTE — Addendum Note (Signed)
Addended by: Kelle Darting A on: 11/26/2021 10:56 AM   Modules accepted: Orders

## 2021-11-27 DIAGNOSIS — E0500 Thyrotoxicosis with diffuse goiter without thyrotoxic crisis or storm: Secondary | ICD-10-CM | POA: Diagnosis not present

## 2021-11-27 DIAGNOSIS — R6889 Other general symptoms and signs: Secondary | ICD-10-CM | POA: Diagnosis not present

## 2021-11-28 LAB — RHEUMATOID FACTOR: Rheumatoid fact SerPl-aCnc: <14 IU/mL (ref <14)

## 2021-11-28 LAB — ANA: Anti Nuclear Antibody (ANA): NEGATIVE

## 2021-12-12 ENCOUNTER — Ambulatory Visit (HOSPITAL_COMMUNITY): Payer: Medicare HMO | Attending: Cardiology

## 2021-12-12 DIAGNOSIS — Z7901 Long term (current) use of anticoagulants: Secondary | ICD-10-CM

## 2021-12-12 DIAGNOSIS — I350 Nonrheumatic aortic (valve) stenosis: Secondary | ICD-10-CM | POA: Diagnosis not present

## 2021-12-12 DIAGNOSIS — R42 Dizziness and giddiness: Secondary | ICD-10-CM | POA: Diagnosis not present

## 2021-12-12 DIAGNOSIS — I25118 Atherosclerotic heart disease of native coronary artery with other forms of angina pectoris: Secondary | ICD-10-CM

## 2021-12-12 DIAGNOSIS — G4733 Obstructive sleep apnea (adult) (pediatric): Secondary | ICD-10-CM | POA: Diagnosis not present

## 2021-12-12 DIAGNOSIS — I272 Pulmonary hypertension, unspecified: Secondary | ICD-10-CM

## 2021-12-12 DIAGNOSIS — I4821 Permanent atrial fibrillation: Secondary | ICD-10-CM

## 2021-12-12 DIAGNOSIS — R6889 Other general symptoms and signs: Secondary | ICD-10-CM | POA: Diagnosis not present

## 2021-12-12 LAB — ECHOCARDIOGRAM COMPLETE
AR max vel: 0.9 cm2
AV Area VTI: 1.04 cm2
AV Area mean vel: 0.83 cm2
AV Mean grad: 8.8 mmHg
AV Peak grad: 16.1 mmHg
Ao pk vel: 2.01 m/s
S' Lateral: 4 cm

## 2021-12-12 MED ORDER — PERFLUTREN LIPID MICROSPHERE
1.0000 mL | INTRAVENOUS | Status: AC | PRN
  Administered 2021-12-12: 3 mL via INTRAVENOUS

## 2021-12-15 ENCOUNTER — Telehealth: Payer: Self-pay | Admitting: Cardiology

## 2021-12-15 NOTE — Telephone Encounter (Signed)
Patient calling to see if its appt on 10/26 can be switch to virtual. Please advise

## 2021-12-15 NOTE — Progress Notes (Deleted)
Cardiology Office Note   Date:  12/15/2021   ID:  Michael, Whitney Oct 26, 1950, MRN [id number]  PCP:  Mackie Pai, PA-C  Cardiologist:   Kiora Hallberg [name], MD   No chief complaint on file.     History of Present Illness: Michael Whitney is a 71 y.o. male who presents for follow up. 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 He is seen for post hospital follow up.    Diagnosed with atrial fibrillation in 2019.  EP study by Dr. Lovena Le revealing PVCs arising near His bundle and ablation was not recommended due to potential for heart block.  He has been maintained on a rate control therapy with anticoagulation.   He has longstanding history of chronic dizziness. He states this started very slowly but has progressed over time and is happening every day.  Has been unrelated to changes in blood pressure or heart rate.  He has completed some element of physical therapy with minimal improvement.   ED visit at [facility] 10/01/2020.  He presented with feeling weak, dyspnea on exertion, chest pain, dizziness, lower extremity edema.  Troponin slightly elevated, BNP slightly elevated. TTE performed with bilateral atria severely dilated, LVEF 55 to 60%, RV moderately to severely dilated, RVSF moderately reduced, mild to moderate MR, moderate TR, mild to moderate pulmonary hypertension.  He was recommended for diuresis.  He was also started on Protonix 40 mg twice daily for 30 days and then decrease to once daily after 1 month at follow-up with PCP.  His hydrochlorothiazide was increased to 25 mg daily.  Imdur 30 mg initiated.  Discharged on 7 day course of Lasix. He was recommended for consideration of right heart cath as an outpatient.   Seen 10/16/20 noting dizziness which was persistent. He was concerned about weight gain and  wondered if thyroid contributory. He endorsed pain as well as dyspnea on exertion was set up for right and left cardiac catheterization.  Underwent right and left cardiac catheterization 10/24/2020 showing nonobstructive coronary artery disease, minimally elevated left heart filling pressures, mild pulmonary hypertension, normal cardiac output.  He was on appropriate diuretic therapy and noted to have some component of diastolic heart failure.      Patient presented to the ED on 12/11/2020 for further evaluation of chronic dizziness which had recently worsened. He also reported blurred/double vision. He denied any loss of vision, headache, or trouble swallowing. He does have some balance issues though. He did reported an episode of substernal chest tightness that lasted for a few hours a few weeks prior to admission but no recurrent chest pain since then.    In the ED, EKG showed atrial fibrillation with no acute ischemic changes. High-sensitivity troponin 251 >> 232 >> 216. Trend felt to be c/w demand ischemia. CBC was completely normal. Na 137, K 5.0, Glucose 111, BUN 23, Cr 1.36. 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 Brain MRI showed no acute findings. He was started on statin therapy. Seen by vascular surgery who felt his vertebral stensosis was not causing his dizziness. Follow up with Neuro and Endocrinology recommended. Echo was unchanged.   He  did have follow up cardiac evaluation last fall. Echo showed EF 50-55%. Mild RV dysfunction. Biatrial enlargement. Severe aortic valve calcification. Moderate to severe AS. Mean gradient only 10 mm Hg. Cardiac cath done showing nonobstructive CAD. Mild LV filling  pressures and mild pulmonary HTN. No significant AV stenosis. He was seen by pulmonary. Tried on spiriva without any change. He is still using CPAP.  Recent Echo showed no change in moderate AS.    On follow up today he still complains of  dizziness. Reports she has some ocular muscle weakness related to his thyroid disease. He is eating a very healthy diet and going to the gym at least 4 days a week with yoga and weight lifting. Keeps track of BP and HR which have been normal.    Past Medical History:  Diagnosis Date   Atrial fibrillation ([place])    Cataract    Diabetes mellitus without complication ([place])    Hypertension    Seizures ([place])    Thought to be related to something his GF gave him    Past Surgical History:  Procedure Laterality Date   CATARACT EXTRACTION     PVC ABLATION N/A 07/14/2017   Procedure: PVC ABLATION;  Surgeon: Evans Lance, MD;  Location: [facility];  Service: Cardiovascular;  Laterality: N/A;   RIGHT/LEFT HEART CATH AND CORONARY ANGIOGRAPHY N/A 10/24/2020   Procedure: RIGHT/LEFT HEART CATH AND CORONARY ANGIOGRAPHY;  Surgeon: Whitney, Avalina Benko M, MD;  Location: [facility];  Service: Cardiovascular;  Laterality: N/A;   TONSILLECTOMY     VARICOSE VEIN SURGERY       Current Outpatient Medications  Medication Sig Dispense Refill   Accu-Chek FastClix Lancets MISC Use 1 lancet daily to test blood sugar (E11.9)     amLODipine (NORVASC) 2.5 MG tablet Take 1 tablet (2.5 mg total) by mouth daily. 90 tablet 3   ammonium lactate (AMLACTIN) 12 % cream Apply 1 application. topically as needed for dry skin. (Patient not taking: Reported on 10/28/2021) 385 g 0   azelastine (ASTELIN) 0.1 % nasal spray Place 2 sprays into both nostrils 2 (two) times daily. Use in each nostril as directed 30 mL 0   Cholecalciferol (VITAMIN D-3) 25 MCG (1000 UT) CAPS Take 1,000 Units by mouth daily.     levothyroxine (SYNTHROID) 50 MCG tablet Take 1 tablet (50 mcg total) by mouth daily.  90 tablet 3   losartan (COZAAR) 100 MG tablet TAKE 1 TABLET(100 MG) BY MOUTH DAILY 90 tablet 3   Multiple Vitamin (MULTI-VITAMIN) tablet Take 1 tablet by mouth daily.     Omega-3 Fatty Acids (FISH OIL) 1000 MG CAPS Take 1,000 mg by mouth daily.     OVER THE COUNTER MEDICATION Take 6 drops by mouth daily. Vitamins A, D, and K     rivaroxaban (XARELTO) 20 MG TABS tablet Take 1 tablet (20 mg total) by mouth daily with supper. 90 tablet 2   sitaGLIPtin (JANUVIA) 25 MG tablet Take 1 tablet (25 mg total) by mouth daily. 30 tablet 3   triamcinolone cream (KENALOG) 0.1 % Apply 1 application topically 3 (three) times a week.     vitamin B-12 (CYANOCOBALAMIN) 1000 MCG tablet Take 1,000 mcg by mouth daily.     Vitamin D, Ergocalciferol, (DRISDOL) 1.25 MG (50000 UNIT) CAPS capsule Take 1 capsule (50,000 Units total) by mouth every 7 (seven) days. 5 capsule 12   No current facility-administered medications for this visit.    Allergies:   Patient  has no known allergies.    Social History:  The patient  reports that he has never smoked. He has never used smokeless tobacco. He reports current alcohol use of about 2.0 standard drinks of alcohol per week. He reports that he does not use drugs.   Family History:  The patient's family history includes CAD in his mother.    ROS:  Please see the history of present illness.   Otherwise, review of systems are positive for none.   All other systems are reviewed and negative.    PHYSICAL EXAM: VS:  There were no vitals taken for this visit. , BMI There is no height or weight on file to calculate BMI. GEN: Well nourished, well developed, in no acute distress HEENT: normal Neck: no JVD, carotid bruits, or masses Cardiac: IRRR; gr 2/6 systolic murmur RUSB, no rubs, or gallops,no edema  Respiratory:  clear to auscultation bilaterally, normal work of breathing GI: soft, nontender, nondistended, + BS MS: no deformity or atrophy Skin: warm and dry, no rash Neuro:   Strength and sensation are intact Psych: euthymic mood, full affect   EKG:  EKG is not ordered today. The ekg ordered today demonstrates N/A   Recent Labs: 10/29/2021: TSH 1.03 11/26/2021: ALT 20; BUN 25; Creatinine, Ser 1.28; Hemoglobin 16.3; Platelets 134.0; Potassium 4.5; Sodium 136    Lipid Panel    Component Value Date/Time   CHOL 209 (H) 10/29/2021 1415   TRIG 138.0 10/29/2021 1415   HDL 41.10 10/29/2021 1415   CHOLHDL 5 10/29/2021 1415   VLDL 27.6 10/29/2021 1415   LDLCALC 140 (H) 10/29/2021 1415      Wt Readings from Last 3 Encounters:  10/28/21 288 lb 9.6 oz (130.9 kg)  10/08/21 291 lb (132 kg)  08/18/21 297 lb (134.7 kg)      Other studies Reviewed: Additional studies/ records that were reviewed today include:   Echo 10/02/20 The left ventricular size is normal. There is severe concentric left  ventricular hypertrophy. The left ventricular wall motion is normal.  Left ventricular systolic function is normal. LV ejection fraction =  55-60%.  Left ventricular filling pattern is pseudonormal. LAP is  indeterminate.  The right ventricle is moderate to severely dilated. The right  ventricular systolic function is moderately reduced.  The atria are severely dilated.  There is mild to moderate mitral regurgitation.  There is moderate tricuspid regurgitation.  Mild to moderate pulmonary hypertension.  Estimated right ventricular systolic pressure is 46 mmHg.  The IVC is dilated with an abnormal collapsibility index, this  suggestive of increased right atrial pressure.   Progression of RV changes and atrial dilation compared to 2020 study.      RIGHT/LEFT HEART CATH AND CORONARY ANGIOGRAPHY    Conclusion       Ost LM to Mid LM lesion is 30% stenosed.   Prox LAD lesion is 30% stenosed.   Mid Cx lesion is 45% stenosed.   Prox RCA to Mid RCA lesion is 30% stenosed.   The left ventricular systolic function is normal.   LV end diastolic pressure is mildly  elevated.   The left ventricular ejection fraction is 55-65% by visual estimate.   Hemodynamic findings consistent with mild pulmonary hypertension.   There is no aortic valve stenosis.   There is trivial (1+) mitral regurgitation.   Nonobstructive CAD Minimally elevated left heart filling pressures.  Mild pulmonary HTN. Normal cardiac output.   Plan: patient is on appropriate diuretic therapy (considering he was  hydrated prior to cath). Would consider evaluating possible pulmonary cause of his dyspnea. He does have some component of diastolic CHF. May consider PYP scan to evaluate for possible amyloid       Echo 12/12/20:IMPRESSIONS     1. Left ventricular ejection fraction, by estimation, is 50 to 55%. The  left ventricle has low normal function. The left ventricle has no regional  wall motion abnormalities. There is mild left ventricular hypertrophy.  Left ventricular diastolic function   could not be evaluated.   2. Right ventricular systolic function is mildly reduced. The right  ventricular size is normal.   3. Left atrial size was severely dilated.   4. Right atrial size was moderately dilated.   5. The mitral valve is normal in structure. No evidence of mitral valve  regurgitation. No evidence of mitral stenosis.   6. The aortic valve is tricuspid. There is severe calcifcation of the  aortic valve. Aortic valve regurgitation is trivial. The aortic valve is  moderately to severely calcified with markedly reduced cusp excursion. By  Doppler parameters stensosis is  likely moderate but visually appears more severe.   7. The inferior vena cava is dilated in size with >50% respiratory  variability, suggesting right atrial pressure of 8 mmHg.      Echo 12/12/21: IMPRESSIONS     1. Heavily calcified aortic valve with probable moderate AS (mean  gradient 9 mmHg; AVA 1.04 cm2; DI 0.46; visually appears at least  moderate).   2. Left ventricular ejection fraction, by  estimation, is 50 to 55%. The  left ventricle has low normal function. The left ventricle has no regional  wall motion abnormalities. There is mild concentric left ventricular  hypertrophy. Left ventricular  diastolic function could not be evaluated.   3. Right ventricular systolic function is normal. The right ventricular  size is normal.   4. Left atrial size was mildly dilated.   5. Right atrial size was mildly dilated.   6. The mitral valve is normal in structure. Trivial mitral valve  regurgitation. No evidence of mitral stenosis.   7. The aortic valve is calcified. Aortic valve regurgitation is mild.  Moderate aortic valve stenosis.   8. The inferior vena cava is normal in size with greater than 50%  respiratory variability, suggesting right atrial pressure of 3 mmHg.   ASSESSMENT AND PLAN:  1.  Non-Obstructive CAD Recent cath in 10/2020 showed mild non-obstructive CAD. High-sensitivity troponin 251 >> 232 >> 216. EKG shows no acute ischemic changes. Echo showed LVEF of 50-55%.  No regional wall motion abnormality. No additional ischemic evaluation felt to be needed given recent reassuring cardiac catheterization. Continue high-intensity statin given CAD and vertebral artery stenosis. No Aspirin due to need for DOAC and no beta blocker due to baseline bradycardia.   Chronic Diastolic CHF Echo showed LV function of 50 to 55%.    Mildly reduced RV function. Mildly elevated LV filling pressures at cath. Patient has Lasix '20mg'$  daily to take PRN.    Permanent Atrial Fibrillation  No AV nodal agents due to baseline bradycardia. On Xarelto.    Hypertension  Continue Losartan and amlodipine   Hyperlipidemia Now on high dose statin.   Type 2 Diabetes Mellitus  Hemoglobin A1c 6.7 on 11/29/2020. Not on any medications at home. Will need to follow-up with PCP.   Chronic Dizziness Patient has chronic dizziness. This has been stable. Also reported double vision. Extensive vascular and  neurologic evaluation negative. ? Related to ocular  thyroid disease.    Hypothyroidism Hashimoto Thyroiditis Patient has a history of hypothyroidism/Hashimoto; He states he was previously on Methimazole but broke out into a rash with this. Apparently had RAI therapy in the past. TSH normal at 1.55 and free T4 only slightly low at 0.55 on 11/29/2020. He is wondering if his Hashimoto's could be causing his chronic dizziness.    Aortic stenosis Echocardiogram this admission showed severe calcifcation of the  aortic valve. Aortic valve regurgitation is trivial. The aortic valve is  moderately to severely calcified with markedly reduced cusp excursion. By Doppler parameters stensosis is  likely moderate There was no evidence of aortic stenosis on recent cardiac catheterization. Will monitor for now. Repeat Echo in 6 months       Current medicines are reviewed at length with the patient today.  The patient does not have concerns regarding medicines.  The following changes have been made:  no change  Labs/ tests ordered today include:   No orders of the defined types were placed in this encounter.        Disposition:   FU with me in 6 months  Signed, Michael Campoy [name], MD  12/15/2021 8:20 AM    [facility] [address], [place], [place], [address] Phone [phone], Fax [phone]

## 2021-12-15 NOTE — Telephone Encounter (Signed)
LMTCB regarding virtual visit.

## 2021-12-16 NOTE — Telephone Encounter (Signed)
Spoke to patient 10/26 appointment with Dr.Jordan cancelled and rescheduled to 11/10 at 2:40 pm.

## 2021-12-16 NOTE — Telephone Encounter (Signed)
Patient returning call.

## 2021-12-18 ENCOUNTER — Ambulatory Visit: Payer: Medicare HMO | Admitting: Cardiology

## 2021-12-19 NOTE — Progress Notes (Deleted)
Cardiology Office Note   Date:  12/19/2021   ID:  Michael Whitney, Michael Whitney April 12, 1950, MRN [id number]  PCP:  Mackie Pai, PA-C  Cardiologist:   Kostas Marrow [name], MD   No chief complaint on file.     History of Present Illness: Michael Whitney is a 71 y.o. male who presents for follow up. 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 He is seen for post hospital follow up.    Diagnosed with atrial fibrillation in 2019.  EP study by Dr. Lovena Le revealing PVCs arising near His bundle and ablation was not recommended due to potential for heart block.  He has been maintained on a rate control therapy with anticoagulation.   He has longstanding history of chronic dizziness. He states this started very slowly but has progressed over time and is happening every day.  Has been unrelated to changes in blood pressure or heart rate.  He has completed some element of physical therapy with minimal improvement.   ED visit at [facility] 10/01/2020.  He presented with feeling weak, dyspnea on exertion, chest pain, dizziness, lower extremity edema.  Troponin slightly elevated, BNP slightly elevated. TTE performed with bilateral atria severely dilated, LVEF 55 to 60%, RV moderately to severely dilated, RVSF moderately reduced, mild to moderate MR, moderate TR, mild to moderate pulmonary hypertension.  He was recommended for diuresis.  He was also started on Protonix 40 mg twice daily for 30 days and then decrease to once daily after 1 month at follow-up with PCP.  His hydrochlorothiazide was increased to 25 mg daily.  Imdur 30 mg initiated.  Discharged on 7 day course of Lasix. He was recommended for consideration of right heart cath as an outpatient.   Seen 10/16/20 noting dizziness which was persistent. He was concerned about weight gain and  wondered if thyroid contributory. He endorsed pain as well as dyspnea on exertion was set up for right and left cardiac catheterization.  Underwent right and left cardiac catheterization 10/24/2020 showing nonobstructive coronary artery disease, minimally elevated left heart filling pressures, mild pulmonary hypertension, normal cardiac output.  He was on appropriate diuretic therapy and noted to have some component of diastolic heart failure.      Patient presented to the ED on 12/11/2020 for further evaluation of chronic dizziness which had recently worsened. He also reported blurred/double vision. He denied any loss of vision, headache, or trouble swallowing. He does have some balance issues though. He did reported an episode of substernal chest tightness that lasted for a few hours a few weeks prior to admission but no recurrent chest pain since then.    In the ED, EKG showed atrial fibrillation with no acute ischemic changes. High-sensitivity troponin 251 >> 232 >> 216. Trend felt to be c/w demand ischemia. CBC was completely normal. Na 137, K 5.0, Glucose 111, BUN 23, Cr 1.36. 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 Brain MRI showed no acute findings. He was started on statin therapy. Seen by vascular surgery who felt his vertebral stensosis was not causing his dizziness. Follow up with Neuro and Endocrinology recommended. Echo was unchanged.   He  did have follow up cardiac evaluation last fall. Echo showed EF 50-55%. Mild RV dysfunction. Biatrial enlargement. Severe aortic valve calcification. Moderate to severe AS. Mean gradient only 10 mm Hg. Cardiac cath done showing nonobstructive CAD. Mild LV filling  pressures and mild pulmonary HTN. No significant AV stenosis. He was seen by pulmonary. Tried on spiriva without any change. He is still using CPAP.  Recent Echo showed no change in moderate AS.    On follow up today he still complains of  dizziness. Reports she has some ocular muscle weakness related to his thyroid disease. He is eating a very healthy diet and going to the gym at least 4 days a week with yoga and weight lifting. Keeps track of BP and HR which have been normal.    Past Medical History:  Diagnosis Date   Atrial fibrillation ([place])    Cataract    Diabetes mellitus without complication ([place])    Hypertension    Seizures ([place])    Thought to be related to something his GF gave him    Past Surgical History:  Procedure Laterality Date   CATARACT EXTRACTION     PVC ABLATION N/A 07/14/2017   Procedure: PVC ABLATION;  Surgeon: Evans Lance, MD;  Location: [place] [facility];  Service: Cardiovascular;  Laterality: N/A;   RIGHT/LEFT HEART CATH AND CORONARY ANGIOGRAPHY N/A 10/24/2020   Procedure: RIGHT/LEFT HEART CATH AND CORONARY ANGIOGRAPHY;  Surgeon: [name], Yisrael Obryan M, MD;  Location: [facility];  Service: Cardiovascular;  Laterality: N/A;   TONSILLECTOMY     VARICOSE VEIN SURGERY       Current Outpatient Medications  Medication Sig Dispense Refill   Accu-Chek FastClix Lancets MISC Use 1 lancet daily to test blood sugar (E11.9)     amLODipine (NORVASC) 2.5 MG tablet Take 1 tablet (2.5 mg total) by mouth daily. 90 tablet 3   ammonium lactate (AMLACTIN) 12 % cream Apply 1 application. topically as needed for dry skin. (Patient not taking: Reported on 10/28/2021) 385 g 0   azelastine (ASTELIN) 0.1 % nasal spray Place 2 sprays into both nostrils 2 (two) times daily. Use in each nostril as directed 30 mL 0   Cholecalciferol (VITAMIN D-3) 25 MCG (1000 UT) CAPS Take 1,000 Units by mouth daily.     levothyroxine (SYNTHROID) 50 MCG tablet Take 1 tablet (50 mcg total) by mouth daily.  90 tablet 3   losartan (COZAAR) 100 MG tablet TAKE 1 TABLET(100 MG) BY MOUTH DAILY 90 tablet 3   Multiple Vitamin (MULTI-VITAMIN) tablet Take 1 tablet by mouth daily.     Omega-3 Fatty Acids (FISH OIL) 1000 MG CAPS Take 1,000 mg by mouth daily.     OVER THE COUNTER MEDICATION Take 6 drops by mouth daily. Vitamins A, D, and K     rivaroxaban (XARELTO) 20 MG TABS tablet Take 1 tablet (20 mg total) by mouth daily with supper. 90 tablet 2   sitaGLIPtin (JANUVIA) 25 MG tablet Take 1 tablet (25 mg total) by mouth daily. 30 tablet 3   triamcinolone cream (KENALOG) 0.1 % Apply 1 application topically 3 (three) times a week.     vitamin B-12 (CYANOCOBALAMIN) 1000 MCG tablet Take 1,000 mcg by mouth daily.     Vitamin D, Ergocalciferol, (DRISDOL) 1.25 MG (50000 UNIT) CAPS capsule Take 1 capsule (50,000 Units total) by mouth every 7 (seven) days. 5 capsule 12   No current facility-administered medications for this visit.    Allergies:   Patient  has no known allergies.    Social History:  The patient  reports that he has never smoked. He has never used smokeless tobacco. He reports current alcohol use of about 2.0 standard drinks of alcohol per week. He reports that he does not use drugs.   Family History:  The patient's family history includes CAD in his mother.    ROS:  Please see the history of present illness.   Otherwise, review of systems are positive for none.   All other systems are reviewed and negative.    PHYSICAL EXAM: VS:  There were no vitals taken for this visit. , BMI There is no height or weight on file to calculate BMI. GEN: Well nourished, well developed, in no acute distress HEENT: normal Neck: no JVD, carotid bruits, or masses Cardiac: IRRR; gr 2/6 systolic murmur RUSB, no rubs, or gallops,no edema  Respiratory:  clear to auscultation bilaterally, normal work of breathing GI: soft, nontender, nondistended, + BS MS: no deformity or atrophy Skin: warm and dry, no rash Neuro:   Strength and sensation are intact Psych: euthymic mood, full affect   EKG:  EKG is not ordered today. The ekg ordered today demonstrates N/A   Recent Labs: 10/29/2021: TSH 1.03 11/26/2021: ALT 20; BUN 25; Creatinine, Ser 1.28; Hemoglobin 16.3; Platelets 134.0; Potassium 4.5; Sodium 136    Lipid Panel    Component Value Date/Time   CHOL 209 (H) 10/29/2021 1415   TRIG 138.0 10/29/2021 1415   HDL 41.10 10/29/2021 1415   CHOLHDL 5 10/29/2021 1415   VLDL 27.6 10/29/2021 1415   LDLCALC 140 (H) 10/29/2021 1415      Wt Readings from Last 3 Encounters:  10/28/21 288 lb 9.6 oz (130.9 kg)  10/08/21 291 lb (132 kg)  08/18/21 297 lb (134.7 kg)      Other studies Reviewed: Additional studies/ records that were reviewed today include:   Echo 10/02/20 The left ventricular size is normal. There is severe concentric left  ventricular hypertrophy. The left ventricular wall motion is normal.  Left ventricular systolic function is normal. LV ejection fraction =  55-60%.  Left ventricular filling pattern is pseudonormal. LAP is  indeterminate.  The right ventricle is moderate to severely dilated. The right  ventricular systolic function is moderately reduced.  The atria are severely dilated.  There is mild to moderate mitral regurgitation.  There is moderate tricuspid regurgitation.  Mild to moderate pulmonary hypertension.  Estimated right ventricular systolic pressure is 46 mmHg.  The IVC is dilated with an abnormal collapsibility index, this  suggestive of increased right atrial pressure.   Progression of RV changes and atrial dilation compared to 2020 study.      RIGHT/LEFT HEART CATH AND CORONARY ANGIOGRAPHY    Conclusion       Ost LM to Mid LM lesion is 30% stenosed.   Prox LAD lesion is 30% stenosed.   Mid Cx lesion is 45% stenosed.   Prox RCA to Mid RCA lesion is 30% stenosed.   The left ventricular systolic function is normal.   LV end diastolic pressure is mildly  elevated.   The left ventricular ejection fraction is 55-65% by visual estimate.   Hemodynamic findings consistent with mild pulmonary hypertension.   There is no aortic valve stenosis.   There is trivial (1+) mitral regurgitation.   Nonobstructive CAD Minimally elevated left heart filling pressures.  Mild pulmonary HTN. Normal cardiac output.   Plan: patient is on appropriate diuretic therapy (considering he was  hydrated prior to cath). Would consider evaluating possible pulmonary cause of his dyspnea. He does have some component of diastolic CHF. May consider PYP scan to evaluate for possible amyloid       Echo 12/12/20:IMPRESSIONS     1. Left ventricular ejection fraction, by estimation, is 50 to 55%. The  left ventricle has low normal function. The left ventricle has no regional  wall motion abnormalities. There is mild left ventricular hypertrophy.  Left ventricular diastolic function   could not be evaluated.   2. Right ventricular systolic function is mildly reduced. The right  ventricular size is normal.   3. Left atrial size was severely dilated.   4. Right atrial size was moderately dilated.   5. The mitral valve is normal in structure. No evidence of mitral valve  regurgitation. No evidence of mitral stenosis.   6. The aortic valve is tricuspid. There is severe calcifcation of the  aortic valve. Aortic valve regurgitation is trivial. The aortic valve is  moderately to severely calcified with markedly reduced cusp excursion. By  Doppler parameters stensosis is  likely moderate but visually appears more severe.   7. The inferior vena cava is dilated in size with >50% respiratory  variability, suggesting right atrial pressure of 8 mmHg.      Echo 12/12/21: IMPRESSIONS     1. Heavily calcified aortic valve with probable moderate AS (mean  gradient 9 mmHg; AVA 1.04 cm2; DI 0.46; visually appears at least  moderate).   2. Left ventricular ejection fraction, by  estimation, is 50 to 55%. The  left ventricle has low normal function. The left ventricle has no regional  wall motion abnormalities. There is mild concentric left ventricular  hypertrophy. Left ventricular  diastolic function could not be evaluated.   3. Right ventricular systolic function is normal. The right ventricular  size is normal.   4. Left atrial size was mildly dilated.   5. Right atrial size was mildly dilated.   6. The mitral valve is normal in structure. Trivial mitral valve  regurgitation. No evidence of mitral stenosis.   7. The aortic valve is calcified. Aortic valve regurgitation is mild.  Moderate aortic valve stenosis.   8. The inferior vena cava is normal in size with greater than 50%  respiratory variability, suggesting right atrial pressure of 3 mmHg.   ASSESSMENT AND PLAN:  1.  Non-Obstructive CAD Recent cath in 10/2020 showed mild non-obstructive CAD. High-sensitivity troponin 251 >> 232 >> 216. EKG shows no acute ischemic changes. Echo showed LVEF of 50-55%.  No regional wall motion abnormality. No additional ischemic evaluation felt to be needed given recent reassuring cardiac catheterization. Continue high-intensity statin given CAD and vertebral artery stenosis. No Aspirin due to need for DOAC and no beta blocker due to baseline bradycardia.   Chronic Diastolic CHF Echo showed LV function of 50 to 55%.    Mildly reduced RV function. Mildly elevated LV filling pressures at cath. Patient has Lasix '20mg'$  daily to take PRN.    Permanent Atrial Fibrillation  No AV nodal agents due to baseline bradycardia. On Xarelto.    Hypertension  Continue Losartan and amlodipine   Hyperlipidemia Now on high dose statin.   Type 2 Diabetes Mellitus  Hemoglobin A1c 6.7 on 11/29/2020. Not on any medications at home. Will need to follow-up with PCP.   Chronic Dizziness Patient has chronic dizziness. This has been stable. Also reported double vision. Extensive vascular and  neurologic evaluation negative. ? Related to ocular  thyroid disease.    Hypothyroidism Hashimoto Thyroiditis Patient has a history of hypothyroidism/Hashimoto; He states he was previously on Methimazole but broke out into a rash with this. Apparently had RAI therapy in the past. TSH normal at 1.55 and free T4 only slightly low at 0.55 on 11/29/2020. He is wondering if his Hashimoto's could be causing his chronic dizziness.    Aortic stenosis Echocardiogram this admission showed severe calcifcation of the  aortic valve. Aortic valve regurgitation is trivial. The aortic valve is  moderately to severely calcified with markedly reduced cusp excursion. By Doppler parameters stensosis is  likely moderate There was no evidence of aortic stenosis on recent cardiac catheterization. Will monitor for now. Repeat Echo in 6 months       Current medicines are reviewed at length with the patient today.  The patient does not have concerns regarding medicines.  The following changes have been made:  no change  Labs/ tests ordered today include:   No orders of the defined types were placed in this encounter.        Disposition:   FU with me in 6 months  Signed, Cassadee Vanzandt [name], MD  12/19/2021 3:13 PM    [facility] [address] [place], [place], [address] Phone [phone], Fax [phone]

## 2021-12-25 DIAGNOSIS — R6889 Other general symptoms and signs: Secondary | ICD-10-CM | POA: Diagnosis not present

## 2021-12-25 DIAGNOSIS — E0500 Thyrotoxicosis with diffuse goiter without thyrotoxic crisis or storm: Secondary | ICD-10-CM | POA: Diagnosis not present

## 2021-12-30 ENCOUNTER — Telehealth: Payer: Self-pay | Admitting: Cardiology

## 2021-12-30 NOTE — Telephone Encounter (Signed)
Patient stated he just found out his ride-share for Friday's appointment has been canceled and he would like know if he can have a MyChart video visit instead.

## 2021-12-31 NOTE — Progress Notes (Signed)
Virtual Visit via Video Note   Because of Michael Whitney's co-morbid illnesses, he is at least at moderate risk for complications without adequate follow up.  This format is felt to be most appropriate for this patient at this time.  All issues noted in this document were discussed and addressed.  A limited physical exam was performed with this format.  Please refer to the patient's chart for his consent to telehealth for [facility].       Date:  01/02/2022   ID:  Michael Whitney, DOB 28-Nov-1950, MRN [id number] The patient was identified using 2 identifiers.  Patient Location: Home Provider Location: Office/Clinic   PCP:  Saguier, [name], [place] Providers Cardiologist:  Zaine Elsass [name], MD Electrophysiologist:  Cristopher Peru, MD     Evaluation Performed:  Follow-Up Visit  Chief Complaint:  dizziness  History of Present Illness:    Michael KOWALSKI is a 71 y.o. 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    Diagnosed with atrial fibrillation in 2019.  EP study by Dr. Lovena Le revealing PVCs arising near His bundle and ablation was not recommended due to potential for heart block.  He has been maintained on a rate control therapy with anticoagulation.   He has longstanding history of chronic dizziness. He states this started very slowly but has progressed over time and is happening every day.  Has been unrelated to changes in blood pressure or heart rate.  He has completed some element of physical therapy with minimal improvement.   [facility] visit at [facility] 10/01/[facility].  He presented with feeling weak, dyspnea on exertion, chest pain, dizziness, lower extremity edema.  Troponin slightly elevated, BNP slightly elevated. TTE performed with bilateral atria severely dilated, LVEF 55 to 60%, RV  moderately to severely dilated, RVSF moderately reduced, mild to moderate MR, moderate TR, mild to moderate pulmonary hypertension.  He was recommended for diuresis.  He was also started on Protonix 40 mg twice daily for 30 days and then decrease to once daily after 1 month at follow-up with PCP.  His hydrochlorothiazide was increased to 25 mg daily.  Imdur 30 mg initiated.  Discharged on 7 day course of Lasix. He was recommended for consideration of right heart cath as an outpatient.   Seen 10/16/20 noting dizziness which was persistent. He was concerned about weight gain and wondered if thyroid contributory. He endorsed pain as well as dyspnea on exertion was set up for right and left cardiac catheterization.  Underwent right and left cardiac catheterization 10/24/2020 showing nonobstructive coronary artery disease, minimally elevated left heart filling pressures, mild pulmonary hypertension, normal cardiac output.  He was on appropriate diuretic therapy and noted to have some component of diastolic heart failure.      Patient presented to the ED on 12/11/2020 for further evaluation of chronic dizziness which had recently worsened. He also reported blurred/double vision. He denied any loss of vision, headache, or trouble swallowing. He does have some balance issues though. He did reported an episode of substernal chest tightness that lasted for a few hours a few weeks prior to admission but no recurrent chest pain since then.    In the ED, EKG showed atrial fibrillation with no acute ischemic changes. High-sensitivity troponin 251 >> 232 >> 216. Trend felt to be c/w demand ischemia. CBC was completely normal. Na 137,  K 5.0, Glucose 111, BUN 23, Cr 1.36. 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 Brain MRI showed no acute findings. He was started on statin therapy. Seen by vascular surgery who felt his vertebral stensosis was not causing his dizziness. Follow up with Neuro and Endocrinology recommended. Echo was unchanged.    He did have follow up cardiac evaluation last fall. Echo showed EF 50-55%. Mild RV dysfunction. Biatrial enlargement. Severe aortic valve calcification. Moderate to severe AS. Mean gradient only 10 mm Hg. Cardiac cath done showing nonobstructive CAD. Mild LV filling pressures and mild pulmonary HTN. No significant AV stenosis. He was seen by pulmonary. Tried on spiriva without any change. He is still using CPAP.   On follow up today he still complains of  dizziness. Now on T[place]for thyroid eye disease. BP and HR stable. No chest pain or dyspnea. Still working out regularly. He is eating a very healthy diet. Recent Echo showed EF 50-55% with moderate AS unchanged from prior.    Past Medical History:  Diagnosis Date   Atrial fibrillation ([place]    Cataract    Diabetes mellitus without complication ([place]    Hypertension    Seizures [place]    Thought to be related to something his GF gave him   Past Surgical History:  Procedure Laterality Date   CATARACT EXTRACTION     PVC ABLATION N/A 07/14/2017   Procedure: PVC ABLATION;  Surgeon: TEvans Lance MD;  [facility];  Service: Cardiovascular;  Laterality: N/A;   RIGHT/LEFT HEART CATH AND CORONARY ANGIOGRAPHY N/A 10/24/2020   Procedure: RIGHT/LEFT HEART CATH AND CORONARY ANGIOGRAPHY;  Surgeon: JMartinique Faatima Tench M, MD;  [facility];  Service: Cardiovascular;  Laterality: N/A;   TONSILLECTOMY     VARICOSE VEIN SURGERY       Current Meds  Medication Sig   Accu-Chek FastClix Lancets MISC Use 1 lancet daily to test blood sugar (E11.9)   amLODipine (NORVASC) 2.5 MG tablet Take 1 tablet (2.5 mg total) by mouth  daily.   ammonium lactate (AMLACTIN) 12 % cream Apply 1 application. topically as needed for dry skin.   azelastine (ASTELIN) 0.1 % nasal spray Place 2 sprays into both nostrils 2 (two) times daily. Use in each nostril as directed   Cholecalciferol (VITAMIN D-3) 25 MCG (1000 UT) CAPS Take 1,000 Units by mouth daily.   levothyroxine (SYNTHROID) 50 MCG tablet Take 1 tablet (50 mcg total) by mouth daily.   losartan (COZAAR) 100 MG tablet TAKE 1 TABLET(100 MG) BY MOUTH DAILY   Multiple Vitamin (MULTI-VITAMIN) tablet Take 1 tablet by mouth daily.   Omega-3 Fatty Acids (FISH OIL) 1000 MG CAPS Take 1,000 mg by mouth daily.   OVER THE COUNTER MEDICATION Take 6 drops by mouth daily. Vitamins A, D, and K   rivaroxaban (XARELTO) 20 MG TABS tablet Take 1 tablet (20 mg total) by mouth daily with supper.   sitaGLIPtin (JANUVIA) 25 MG tablet Take 1 tablet (25 mg total) by mouth daily.   Teprotumumab-trbw (TEPEZZA IV) Inject into the vein. Every 3 weeks.   triamcinolone cream (KENALOG) 0.1 % Apply 1 application topically 3 (three) times a week.   vitamin B-12 (CYANOCOBALAMIN) 1000 MCG tablet Take 1,000  mcg by mouth daily.   Vitamin D, Ergocalciferol, (DRISDOL) 1.25 MG (50000 UNIT) CAPS capsule Take 1 capsule (50,000 Units total) by mouth every 7 (seven) days.     Allergies:   Patient has no known allergies.   Social History   Tobacco Use   Smoking status: Never   Smokeless tobacco: Never  Vaping Use   Vaping Use: Never used  Substance Use Topics   Alcohol use: Yes    Alcohol/week: 2.0 standard drinks of alcohol    Types: 1 Shots of liquor, 1 Cans of beer per week    Comment: occasionally   Drug use: No     Family Hx: The patient's family history includes CAD in his mother.  ROS:   Please see the history of present illness.     All other systems reviewed and are negative.   Prior CV studies:   The following studies were reviewed today:  Echo 10/02/20 The left ventricular size is normal.  There is severe concentric left  ventricular hypertrophy. The left ventricular wall motion is normal.  Left ventricular systolic function is normal. LV ejection fraction =  55-60%.  Left ventricular filling pattern is pseudonormal. LAP is  indeterminate.  The right ventricle is moderate to severely dilated. The right  ventricular systolic function is moderately reduced.  The atria are severely dilated.  There is mild to moderate mitral regurgitation.  There is moderate tricuspid regurgitation.  Mild to moderate pulmonary hypertension.  Estimated right ventricular systolic pressure is 46 mmHg.  The IVC is dilated with an abnormal collapsibility index, this  suggestive of increased right atrial pressure.   Progression of RV changes and atrial dilation compared to 2020 study.      RIGHT/LEFT HEART CATH AND CORONARY ANGIOGRAPHY    Conclusion       Ost LM to Mid LM lesion is 30% stenosed.   Prox LAD lesion is 30% stenosed.   Mid Cx lesion is 45% stenosed.   Prox RCA to Mid RCA lesion is 30% stenosed.   The left ventricular systolic function is normal.   LV end diastolic pressure is mildly elevated.   The left ventricular ejection fraction is 55-65% by visual estimate.   Hemodynamic findings consistent with mild pulmonary hypertension.   There is no aortic valve stenosis.   There is trivial (1+) mitral regurgitation.   Nonobstructive CAD Minimally elevated left heart filling pressures.  Mild pulmonary HTN. Normal cardiac output.   Plan: patient is on appropriate diuretic therapy (considering he was hydrated prior to cath). Would consider evaluating possible pulmonary cause of his dyspnea. He does have some component of diastolic CHF. May consider PYP scan to evaluate for possible amyloid       Echo 12/12/20:IMPRESSIONS     1. Left ventricular ejection fraction, by estimation, is 50 to 55%. The  left ventricle has low normal function. The left ventricle has no regional  wall  motion abnormalities. There is mild left ventricular hypertrophy.  Left ventricular diastolic function   could not be evaluated.   2. Right ventricular systolic function is mildly reduced. The right  ventricular size is normal.   3. Left atrial size was severely dilated.   4. Right atrial size was moderately dilated.   5. The mitral valve is normal in structure. No evidence of mitral valve  regurgitation. No evidence of mitral stenosis.   6. The aortic valve is tricuspid. There is severe calcifcation of the  aortic valve. Aortic valve regurgitation is  trivial. The aortic valve is  moderately to severely calcified with markedly reduced cusp excursion. By  Doppler parameters stensosis is  likely moderate but visually appears more severe.   7. The inferior vena cava is dilated in size with >50% respiratory  variability, suggesting right atrial pressure of 8 mmHg.     Echo 12/12/21: IMPRESSIONS     1. Heavily calcified aortic valve with probable moderate AS (mean  gradient 9 mmHg; AVA 1.04 cm2; DI 0.46; visually appears at least  moderate).   2. Left ventricular ejection fraction, by estimation, is 50 to 55%. The  left ventricle has low normal function. The left ventricle has no regional  wall motion abnormalities. There is mild concentric left ventricular  hypertrophy. Left ventricular  diastolic function could not be evaluated.   3. Right ventricular systolic function is normal. The right ventricular  size is normal.   4. Left atrial size was mildly dilated.   5. Right atrial size was mildly dilated.   6. The mitral valve is normal in structure. Trivial mitral valve  regurgitation. No evidence of mitral stenosis.   7. The aortic valve is calcified. Aortic valve regurgitation is mild.  Moderate aortic valve stenosis.   8. The inferior vena cava is normal in size with greater than 50%  respiratory variability, suggesting right atrial pressure of 3 mmHg.   Labs/Other Tests and Data  Reviewed:    EKG:  No ECG reviewed.  Recent Labs: 10/29/2021: TSH 1.03 11/26/2021: ALT 20; BUN 25; Creatinine, Ser 1.28; Hemoglobin 16.3; Platelets 134.0; Potassium 4.5; Sodium 136   Recent Lipid Panel Lab Results  Component Value Date/Time   CHOL 209 (H) 10/29/2021 02:15 PM   TRIG 138.0 10/29/2021 02:15 PM   HDL 41.10 10/29/2021 02:15 PM   CHOLHDL 5 10/29/2021 02:15 PM   LDLCALC 140 (H) 10/29/2021 02:15 PM    Wt Readings from Last 3 Encounters:  01/02/22 282 lb (127.9 kg)  10/28/21 288 lb 9.6 oz (130.9 kg)  10/08/21 291 lb (132 kg)     Risk Assessment/Calculations:    CHA2DS2-VASc Score = 4   This indicates a 4.8% annual risk of stroke. The patient's score is based upon: CHF History: 1 HTN History: 1 Diabetes History: 1 Stroke History: 0 Vascular Disease History: 0 Age Score: 1 Gender Score: 0         Objective:    Vital Signs:  BP 121/67   Pulse 61   Ht '6\' 3"'$  (1.905 m)   Wt 282 lb (127.9 kg)   BMI 35.25 kg/m    Genreal: no distress.  Skin is clear.  Respirations normal.  Neuro nonfocal.   ASSESSMENT & PLAN:      Non-Obstructive CAD Recent cath in 10/2020 showed mild non-obstructive CAD. High-sensitivity troponin 251 >> 232 >> 216. EKG shows no acute ischemic changes. Echo showed LVEF of 50-55%.  No regional wall motion abnormality. No additional ischemic evaluation felt to be needed given recent reassuring cardiac catheterization. recommend statin but patient refuses.  No Aspirin due to need for DOAC and no beta blocker due to baseline bradycardia.  Chronic Diastolic CHF Echo showed LV function of 50 to 55%.    Mildly elevated LV filling pressures at cath. Patient has Lasix '20mg'$  daily to take PRN.    Permanent Atrial Fibrillation No AV nodal agents due to baseline bradycardia. On Xarelto.   Hypertension Continue Losartan and amlodipine, well controlled.    Hyperlipidemia Patient refuses to take statin.   6.  Type 2 Diabetes Mellitus  Hemoglobin  A1c 6.7 on 11/29/2020. Not on any medications at home. Will need to follow-up with PCP.   7. Chronic Dizziness Patient has chronic dizziness. This has been stable. Also reported double vision. Extensive vascular and neurologic evaluation negative. ? Related to ocular thyroid disease.   Hypothyroidism  Hashimoto Thyroiditis   Aortic stenosis Echocardiogram this admission showed severe calcifcation of the  aortic valve. Aortic valve regurgitation is trivial. The aortic valve is  moderately to severely calcified with markedly reduced cusp excursion. By Doppler parameters stensosis is  likely moderate. There was no evidence of aortic stenosis on recent cardiac catheterization. repeat Echo in October was unchanged.             Time:   Today, I have spent 10 minutes with the patient with telehealth technology discussing the above problems.     Medication Adjustments/Labs and Tests Ordered: Current medicines are reviewed at length with the patient today.  Concerns regarding medicines are outlined above.   Tests Ordered: No orders of the defined types were placed in this encounter.   Medication Changes: No orders of the defined types were placed in this encounter.   Follow Up:  In Person in 6 month(s)  Signed, Jahaad Penado [name], MD  01/02/2022 2:33 PM    [place]

## 2022-01-01 NOTE — Telephone Encounter (Signed)
Spoke to patient visit changed to video visit.

## 2022-01-02 ENCOUNTER — Telehealth: Payer: Self-pay

## 2022-01-02 ENCOUNTER — Encounter: Payer: Self-pay | Admitting: Cardiology

## 2022-01-02 ENCOUNTER — Ambulatory Visit: Payer: Medicare HMO | Admitting: Cardiology

## 2022-01-02 ENCOUNTER — Ambulatory Visit: Payer: Medicare HMO | Attending: Cardiology | Admitting: Cardiology

## 2022-01-02 VITALS — BP 121/67 | HR 61 | Ht 75.0 in | Wt 282.0 lb

## 2022-01-02 DIAGNOSIS — I25118 Atherosclerotic heart disease of native coronary artery with other forms of angina pectoris: Secondary | ICD-10-CM

## 2022-01-02 DIAGNOSIS — Z7901 Long term (current) use of anticoagulants: Secondary | ICD-10-CM | POA: Diagnosis not present

## 2022-01-02 DIAGNOSIS — I350 Nonrheumatic aortic (valve) stenosis: Secondary | ICD-10-CM

## 2022-01-02 DIAGNOSIS — I4821 Permanent atrial fibrillation: Secondary | ICD-10-CM

## 2022-01-02 NOTE — Patient Instructions (Signed)
Medication Instructions:  Continue same medications *If you need a refill on your cardiac medications before your next appointment, please call your pharmacy*   Lab Work: None ordered   Testing/Procedures: None ordered   Follow-Up: At [facility], you and your health needs are our priority.  As part of our continuing mission to provide you with exceptional heart care, we have created designated Provider Care Teams.  These Care Teams include your primary Cardiologist (physician) and Advanced Practice Providers (APPs -  Physician Assistants and Nurse Practitioners) who all work together to provide you with the care you need, when you need it.  We recommend signing up for the patient portal called "MyChart".  Sign up information is provided on this After Visit Summary.  MyChart is used to connect with patients for Virtual Visits (Telemedicine).  Patients are able to view lab/test results, encounter notes, upcoming appointments, etc.  Non-urgent messages can be sent to your provider as well.   To learn more about what you can do with MyChart, go to [email or web address].    Your next appointment:  6 months   Call in Feb to schedule May appointment    The format for your next appointment: Office    Provider:  [place]

## 2022-01-02 NOTE — Telephone Encounter (Signed)
  Patient Consent for Virtual Visit        Michael Whitney has provided verbal consent on 01/02/2022 for a virtual visit (video or telephone).   CONSENT FOR VIRTUAL VISIT FOR:  Michael Whitney  By participating in this virtual visit I agree to the following:  I hereby voluntarily request, consent and authorize [facility] and its employed or contracted physicians, physician assistants, nurse practitioners or other licensed health care professionals (the Practitioner), to provide me with telemedicine health care services (the "Services") as deemed necessary by the treating Practitioner. I acknowledge and consent to receive the Services by the Practitioner via telemedicine. I understand that the telemedicine visit will involve communicating with the Practitioner through live audiovisual communication technology and the disclosure of certain medical information by electronic transmission. I acknowledge that I have been given the opportunity to request an in-person assessment or other available alternative prior to the telemedicine visit and am voluntarily participating in the telemedicine visit.  I understand that I have the right to withhold or withdraw my consent to the use of telemedicine in the course of my care at any time, without affecting my right to future care or treatment, and that the Practitioner or I may terminate the telemedicine visit at any time. I understand that I have the right to inspect all information obtained and/or recorded in the course of the telemedicine visit and may receive copies of available information for a reasonable fee.  I understand that some of the potential risks of receiving the Services via telemedicine include:  Delay or interruption in medical evaluation due to technological equipment failure or disruption; Information transmitted may not be sufficient (e.g. poor resolution of images) to allow for appropriate medical decision making by the Practitioner;  and/or  In rare instances, security protocols could fail, causing a breach of personal health information.  Furthermore, I acknowledge that it is my responsibility to provide information about my medical history, conditions and care that is complete and accurate to the best of my ability. I acknowledge that Practitioner's advice, recommendations, and/or decision may be based on factors not within their control, such as incomplete or inaccurate data provided by me or distortions of diagnostic images or specimens that may result from electronic transmissions. I understand that the practice of medicine is not an exact science and that Practitioner makes no warranties or guarantees regarding treatment outcomes. I acknowledge that a copy of this consent can be made available to me via my patient portal ([place]), or I can request a printed copy by calling the office of [facility].    I understand that my insurance will be billed for this visit.   I have read or had this consent read to me. I understand the contents of this consent, which adequately explains the benefits and risks of the Services being provided via telemedicine.  I have been provided ample opportunity to ask questions regarding this consent and the Services and have had my questions answered to my satisfaction. I give my informed consent for the services to be provided through the use of telemedicine in my medical care

## 2022-01-29 DIAGNOSIS — E0500 Thyrotoxicosis with diffuse goiter without thyrotoxic crisis or storm: Secondary | ICD-10-CM | POA: Diagnosis not present

## 2022-02-03 ENCOUNTER — Ambulatory Visit (INDEPENDENT_AMBULATORY_CARE_PROVIDER_SITE_OTHER): Payer: Medicare HMO | Admitting: Medical

## 2022-02-03 ENCOUNTER — Ambulatory Visit (HOSPITAL_BASED_OUTPATIENT_CLINIC_OR_DEPARTMENT_OTHER)
Admission: RE | Admit: 2022-02-03 | Discharge: 2022-02-03 | Disposition: A | Discharge status: Home / Self Care | Payer: Medicare HMO | Source: Ambulatory Visit | Attending: Medical | Admitting: Medical

## 2022-02-03 VITALS — BP 138/52 | HR 70 | Temp 98.2°F | Resp 18 | Ht 75.0 in | Wt 288.0 lb

## 2022-02-03 DIAGNOSIS — H538 Other visual disturbances: Secondary | ICD-10-CM

## 2022-02-03 DIAGNOSIS — J449 Chronic obstructive pulmonary disease, unspecified: Secondary | ICD-10-CM | POA: Diagnosis not present

## 2022-02-03 DIAGNOSIS — E559 Vitamin D deficiency, unspecified: Secondary | ICD-10-CM

## 2022-02-03 DIAGNOSIS — H5789 Other specified disorders of eye and adnexa: Secondary | ICD-10-CM

## 2022-02-03 DIAGNOSIS — H814 Vertigo of central origin: Secondary | ICD-10-CM

## 2022-02-03 DIAGNOSIS — R5383 Other fatigue: Secondary | ICD-10-CM

## 2022-02-03 DIAGNOSIS — E538 Deficiency of other specified B group vitamins: Secondary | ICD-10-CM

## 2022-02-03 DIAGNOSIS — R519 Headache, unspecified: Secondary | ICD-10-CM | POA: Diagnosis not present

## 2022-02-03 DIAGNOSIS — E079 Disorder of thyroid, unspecified: Secondary | ICD-10-CM

## 2022-02-03 DIAGNOSIS — R42 Dizziness and giddiness: Secondary | ICD-10-CM | POA: Insufficient documentation

## 2022-02-03 DIAGNOSIS — E039 Hypothyroidism, unspecified: Secondary | ICD-10-CM | POA: Diagnosis not present

## 2022-02-03 DIAGNOSIS — E119 Type 2 diabetes mellitus without complications: Secondary | ICD-10-CM | POA: Diagnosis not present

## 2022-02-03 DIAGNOSIS — F1010 Alcohol abuse, uncomplicated: Secondary | ICD-10-CM | POA: Diagnosis not present

## 2022-02-03 NOTE — Patient Instructions (Signed)
1. Blurred vision, bilateral Continue Tepezza for thyroid eye disease.  2. Alcohol abuse Recommend cutting back significantly on the alcohol use.  3. Fatigue, unspecified type Will get fatigue labs today.  4. Chronic dizziness Refer to neurologist at [facility].  5. Vitamin D deficiency Vitamin D level today.  6. B12 deficiency B12 level today.  7. Type 2 diabetes mellitus without complication, without long-term current use of insulin (HCC) Continue Januvia and get A1c.  8. Hypothyroidism, unspecified type Continue levothyroxine and check thyroid studies.  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  10. Vertigo of central origin Referral to neurologist at [facility]  11. Headache disorder Decided to go ahead and order CT of head without contrast today.  Will see if this needs to be prior authorized.  If during the interim pending authorization signs symptoms worsen or change then recommend ED evaluation.  After CT done then might advance to ordering MRI.  Follow-up date to be determined after lab review.

## 2022-02-03 NOTE — Progress Notes (Signed)
Subjective:    Patient ID: Michael Whitney, male    DOB: 1950/10/04, 71 y.o.   MRN: [id number]  HPI  Pt has been seen for intermittent dizziness for years.   Pt has been seen by neurologist, PT, ophthalmologist, cardiologist, ENT, cardiologist and endocrinologist. No definitive cause has been found for dizziness.   Pt thought hypothyroid may have been cause. But thyroid value normal. Pt was referred to Dr. Kelton Pillar. Pt saw her but he did not follow up for some time. Last A1c was 7.2. pt still on januvia.   Pt has been seen by MD for thyroid eye disease. The hope was if thyorid eye disease treated this may have his vision and maybe help with dizziness. Pt has had 4 tepezza injections and has not helped. MD at [facility] thinks he may need repeat mri. Also he suggested referral to [facility].  Neurologist A/P  Assessment and Plan:   Dizziness - unclear etiology but given unremarkable vestibular testing, consider Persistent Postural-Perceptual Dizziness (typically this is treated with an SSRI) or secondary to other systemic disease.  I do not think that the vertebral artery stenosis is source for his dizziness or diplopia.  Dizziness has been ongoing well before the vertebral artery stenosis. Diplopia - most likely related to thyroid disease.  However, I would like to evaluate for Myasthenia gravis as well.   1   Check Myasthenia panel - AchR antibodies with MuSK reflex 2  Further recommendations pending results.   04/23/2021 ADDENDUM: Myasthenia panel including MuSK antibodies was normal.  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  At this time, I have not identified a neuromuscular disease or other primary neurologic disorder as the cause of his symptoms.   Mri in 04/20/2020   FINDINGS: Brain: No restricted diffusion to suggest acute or subacute  infarct. Redemonstrated generalized cerebral atrophy. Scattered T2 hyperintense signal in the periventricular white matter, likely the sequela of chronic small vessel ischemic disease. No acute hemorrhage, hydrocephalus, extra-axial collection, mass, mass effect, or midline shift.   Vascular: Normal flow voids.   Skull and upper cervical spine: Normal marrow signal.   Sinuses/Orbits: Negative.  Status post bilateral lens replacements.   Other: The mastoids are well aerated.   IMPRESSION: No acute intracranial process.  Pt tells me he feels dizziness constantly to some degree, double vision and overall weakness. Symptoms going on for years. Generalized weakness worsening. Pt does not some recent low headaches over past week.  On review of chart has central diplopia.  Review of Systems  Constitutional:  Negative for chills, fatigue and fever.  HENT:  Negative for congestion.   Eyes:  Positive for visual disturbance.  Respiratory:  Negative for cough, chest tightness, shortness of breath and wheezing.   Cardiovascular:  Negative for chest pain and palpitations.  Gastrointestinal:  Negative for abdominal pain.  Genitourinary:  Negative for dysuria.  Musculoskeletal:  Negative for back pain.  Skin:  Negative for rash.  Neurological:  Positive for dizziness and headaches. Negative for facial asymmetry.       Overall weakness.  Hematological:  Does not bruise/bleed easily.  Psychiatric/Behavioral:  Negative for confusion and dysphoric mood.     Past Medical History:  Diagnosis Date   Atrial fibrillation ([place])    Cataract    Diabetes mellitus without complication ([place])    Hypertension    Seizures ([place])  Thought to be related to something his GF gave him     Social History   Socioeconomic History   Marital status: Widowed    Spouse name: Not on file   Number of children: 2   Years of education: Not on file   Highest education level: Not on file  Occupational History    Occupation: retired    Comment: Geophysicist/field seismologist  Tobacco Use   Smoking status: Never   Smokeless tobacco: Never  Vaping Use   Vaping Use: Never used  Substance and Sexual Activity   Alcohol use: Yes    Alcohol/week: 2.0 standard drinks of alcohol    Types: 1 Shots of liquor, 1 Cans of beer per week    Comment: occasionally   Drug use: No   Sexual activity: Not on file  Other Topics Concern   Not on file  Social History Narrative   Not on file   Social Determinants of Health   Financial Resource Strain: Low Risk  (08/18/2021)   Overall Financial Resource Strain (CARDIA)    Difficulty of Paying Living Expenses: Not hard at all  Food Insecurity: No Food Insecurity (08/18/2021)   Hunger Vital Sign    Worried About Running Out of Food in the Last Year: Never true    Ran Out of Food in the Last Year: Never true  Transportation Needs: No Transportation Needs (08/18/2021)   PRAPARE - Hydrologist (Medical): No    Lack of Transportation (Non-Medical): No  Physical Activity: Sufficiently Active (08/18/2021)   Exercise Vital Sign    Days of Exercise per Week: 4 days    Minutes of Exercise per Session: 60 min  Stress: No Stress Concern Present (08/18/2021)   [place]    Feeling of Stress : Not at all  Social Connections: Socially Isolated (08/18/2021)   Social Connection and Isolation Panel [NHANES]    Frequency of Communication with Friends and Family: More than three times a week    Frequency of Social Gatherings with Friends and Family: Twice a week    Attends Religious Services: Never    Marine scientist or Organizations: No    Attends Archivist Meetings: Never    Marital Status: Widowed  Intimate Partner Violence: Not At Risk (08/18/2021)   Humiliation, Afraid, Rape, and Kick questionnaire    Fear of Current or Ex-Partner: No    Emotionally Abused: No    Physically  Abused: No    Sexually Abused: No    Past Surgical History:  Procedure Laterality Date   CATARACT EXTRACTION     PVC ABLATION N/A 07/14/2017   Procedure: PVC ABLATION;  Surgeon: Evans Lance, MD;  Location: [place] [facility];  Service: Cardiovascular;  Laterality: N/A;   RIGHT/LEFT HEART CATH AND CORONARY ANGIOGRAPHY N/A 10/24/2020   Procedure: RIGHT/LEFT HEART CATH AND CORONARY ANGIOGRAPHY;  Surgeon: [name], Peter M, MD;  Location: [place] [facility];  Service: Cardiovascular;  Laterality: N/A;   TONSILLECTOMY     VARICOSE VEIN SURGERY      Family History  Problem Relation Age of Onset   CAD Mother        MI at age 55    No Known Allergies  Current Outpatient Medications on File Prior to Visit  Medication Sig Dispense Refill   Accu-Chek FastClix Lancets MISC Use 1 lancet daily to test blood sugar (E11.9)     ammonium lactate (  AMLACTIN) 12 % cream Apply 1 application. topically as needed for dry skin. 385 g 0   azelastine (ASTELIN) 0.1 % nasal spray Place 2 sprays into both nostrils 2 (two) times daily. Use in each nostril as directed 30 mL 0   Cholecalciferol (VITAMIN D-3) 25 MCG (1000 UT) CAPS Take 1,000 Units by mouth daily.     levothyroxine (SYNTHROID) 50 MCG tablet Take 1 tablet (50 mcg total) by mouth daily. 90 tablet 3   losartan (COZAAR) 100 MG tablet TAKE 1 TABLET(100 MG) BY MOUTH DAILY 90 tablet 3   Multiple Vitamin (MULTI-VITAMIN) tablet Take 1 tablet by mouth daily.     Omega-3 Fatty Acids (FISH OIL) 1000 MG CAPS Take 1,000 mg by mouth daily.     OVER THE COUNTER MEDICATION Take 6 drops by mouth daily. Vitamins A, D, and K     rivaroxaban (XARELTO) 20 MG TABS tablet Take 1 tablet (20 mg total) by mouth daily with supper. 90 tablet 2   sitaGLIPtin (JANUVIA) 25 MG tablet Take 1 tablet (25 mg total) by mouth daily. 30 tablet 3   Teprotumumab-trbw (TEPEZZA IV) Inject into the vein. Every 3 weeks.     triamcinolone cream (KENALOG) 0.1 % Apply 1 application topically 3  (three) times a week.     vitamin B-12 (CYANOCOBALAMIN) 1000 MCG tablet Take 1,000 mcg by mouth daily.     Vitamin D, Ergocalciferol, (DRISDOL) 1.25 MG (50000 UNIT) CAPS capsule Take 1 capsule (50,000 Units total) by mouth every 7 (seven) days. 5 capsule 12   amLODipine (NORVASC) 2.5 MG tablet Take 1 tablet (2.5 mg total) by mouth daily. 90 tablet 3   No current facility-administered medications on file prior to visit.    BP (!) 138/52   Pulse 70   Temp 98.2 F (36.8 C)   Resp 18   Ht '6\' 3"'$  (1.905 m)   Wt 288 lb (130.6 kg)   SpO2 100%   BMI 36.00 kg/m       Objective:   Physical Exam  General Mental Status- Alert. General Appearance- Not in acute distress.   Skin General: Color- Normal Color. Moisture- Normal Moisture.  Neck Carotid Arteries- Normal color. Moisture- Normal Moisture. No carotid bruits. No JVD.  Chest and Lung Exam Auscultation: Breath Sounds:-Normal.  Cardiovascular Auscultation:Rythm- Regular. Murmurs & Other Heart Sounds:Auscultation of the heart reveals- No Murmurs.  Abdomen Inspection:-Inspeection Normal. Palpation/Percussion:Note:No mass. Palpation and Percussion of the abdomen reveal- Non Tender, Non Distended + BS, no rebound or guarding.    Neurologic Cranial Nerve exam:- CN III-XII intact(No nystagmus), symmetric smile. Drift Test:- No drift. Romberg Exam:- Negative.  Heal to Toe Gait exam:-Normal. Finger to Nose:- Normal/Intact Strength:- 5/5 equal and symmetric strength both upper and lower extremities.       Assessment & Plan:   Patient Instructions  1. Blurred vision, bilateral Continue Tepezza for thyroid eye disease.  2. Alcohol abuse Recommend cutting back significantly on the alcohol use.  3. Fatigue, unspecified type Will get fatigue labs today.  4. Chronic dizziness Refer to neurologist at [facility].  5. Vitamin D deficiency Vitamin D level today.  6. B12 deficiency B12 level today.  7. Type 2 diabetes mellitus  without complication, without long-term current use of insulin (HCC) Continue Januvia and get A1c.  8. Hypothyroidism, unspecified type Continue levothyroxine and check thyroid studies.  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  10. Vertigo of central origin Referral to neurologist at [facility].  11. Headache disorder Decided to  go ahead and order CT of head without contrast today.  Will see if this needs to be prior authorized.  If during the interim pending authorization signs symptoms worsen or change then recommend ED evaluation.  After CT done then might advance to ordering MRI.  Follow-up date to be determined after lab review.    Mackie Pai, PA-C   Time spent with patient today was 43  minutes which consisted of chart revdiew, discussing diagnosis, work up treatment and documentation.

## 2022-02-04 LAB — COMPREHENSIVE METABOLIC PANEL
ALT: 28 U/L (ref 0–53)
AST: 23 U/L (ref 0–37)
Albumin: 4.6 g/dL (ref 3.5–5.2)
Alkaline Phosphatase: 37 U/L — ABNORMAL LOW (ref 39–117)
BUN: 27 mg/dL — ABNORMAL HIGH (ref 6–23)
CO2: 26 mEq/L (ref 19–32)
Calcium: 9.3 mg/dL (ref 8.4–10.5)
Chloride: 102 mEq/L (ref 96–112)
Creatinine, Ser: 1.22 mg/dL (ref 0.40–1.50)
GFR: 58.8 mL/min — ABNORMAL LOW (ref >60.00)
Glucose, Bld: 128 mg/dL — ABNORMAL HIGH (ref 70–99)
Potassium: 4.8 mEq/L (ref 3.5–5.1)
Sodium: 137 mEq/L (ref 135–145)
Total Bilirubin: 0.7 mg/dL (ref 0.2–1.2)
Total Protein: 7 g/dL (ref 6.0–8.3)

## 2022-02-04 LAB — TSH: TSH: 2.57 u[IU]/mL (ref 0.35–5.50)

## 2022-02-04 LAB — CBC WITH DIFFERENTIAL/PLATELET
Basophils Absolute: 0.1 10*3/uL (ref 0.0–0.1)
Basophils Relative: 1.2 % (ref 0.0–3.0)
Eosinophils Absolute: 0.1 10*3/uL (ref 0.0–0.7)
Eosinophils Relative: 0.8 % (ref 0.0–5.0)
HCT: 49.7 % (ref 39.0–52.0)
Hemoglobin: 16.8 g/dL (ref 13.0–17.0)
Lymphocytes Relative: 26.5 % (ref 12.0–46.0)
Lymphs Abs: 1.9 10*3/uL (ref 0.7–4.0)
MCHC: 33.8 g/dL (ref 30.0–36.0)
MCV: 102.6 fl — ABNORMAL HIGH (ref 78.0–100.0)
Monocytes Absolute: 0.7 10*3/uL (ref 0.1–1.0)
Monocytes Relative: 10 % (ref 3.0–12.0)
Neutro Abs: 4.4 10*3/uL (ref 1.4–7.7)
Neutrophils Relative %: 61.5 % (ref 43.0–77.0)
Platelets: 132 10*3/uL — ABNORMAL LOW (ref 150.0–400.0)
RBC: 4.84 Mil/uL (ref 4.22–5.81)
RDW: 14.6 % (ref 11.5–15.5)
WBC: 7.2 10*3/uL (ref 4.0–10.5)

## 2022-02-04 LAB — T4, FREE: Free T4: 0.66 ng/dL (ref 0.60–1.60)

## 2022-02-04 LAB — VITAMIN B12: Vitamin B-12: 811 pg/mL (ref 211–911)

## 2022-02-04 LAB — VITAMIN D 25 HYDROXY (VIT D DEFICIENCY, FRACTURES): VITD: 49.54 ng/mL (ref 30.00–100.00)

## 2022-02-05 NOTE — Addendum Note (Signed)
Addended by: Anabel Halon on: 02/05/2022 03:50 PM   Modules accepted: Orders

## 2022-02-06 ENCOUNTER — Other Ambulatory Visit: Payer: Self-pay | Admitting: Medical

## 2022-02-06 MED ORDER — RIVAROXABAN 20 MG PO TABS: 20.0000 mg | ORAL_TABLET | Freq: Every day | ORAL | 0 refills | Status: DC

## 2022-02-07 ENCOUNTER — Ambulatory Visit (HOSPITAL_BASED_OUTPATIENT_CLINIC_OR_DEPARTMENT_OTHER)
Admission: RE | Admit: 2022-02-07 | Discharge: 2022-02-07 | Disposition: A | Discharge status: Home / Self Care | Payer: Medicare HMO | Source: Ambulatory Visit | Attending: Medical | Admitting: Medical

## 2022-02-07 DIAGNOSIS — H814 Vertigo of central origin: Secondary | ICD-10-CM

## 2022-02-07 DIAGNOSIS — R42 Dizziness and giddiness: Secondary | ICD-10-CM | POA: Diagnosis not present

## 2022-02-07 DIAGNOSIS — R519 Headache, unspecified: Secondary | ICD-10-CM | POA: Diagnosis not present

## 2022-02-07 DIAGNOSIS — R200 Anesthesia of skin: Secondary | ICD-10-CM | POA: Diagnosis not present

## 2022-02-07 DIAGNOSIS — H538 Other visual disturbances: Secondary | ICD-10-CM | POA: Diagnosis not present

## 2022-02-07 DIAGNOSIS — G319 Degenerative disease of nervous system, unspecified: Secondary | ICD-10-CM | POA: Diagnosis not present

## 2022-02-07 DIAGNOSIS — R531 Weakness: Secondary | ICD-10-CM | POA: Diagnosis not present

## 2022-02-07 LAB — VITAMIN B1: Vitamin B1 (Thiamine): 9 nmol/L (ref 8–30)

## 2022-02-27 DIAGNOSIS — E0500 Thyrotoxicosis with diffuse goiter without thyrotoxic crisis or storm: Secondary | ICD-10-CM | POA: Diagnosis not present

## 2022-02-28 DIAGNOSIS — R001 Bradycardia, unspecified: Secondary | ICD-10-CM | POA: Diagnosis not present

## 2022-02-28 DIAGNOSIS — H538 Other visual disturbances: Secondary | ICD-10-CM | POA: Diagnosis not present

## 2022-02-28 DIAGNOSIS — M25519 Pain in unspecified shoulder: Secondary | ICD-10-CM | POA: Diagnosis not present

## 2022-02-28 DIAGNOSIS — R531 Weakness: Secondary | ICD-10-CM | POA: Diagnosis not present

## 2022-02-28 DIAGNOSIS — Z20822 Contact with and (suspected) exposure to covid-19: Secondary | ICD-10-CM | POA: Diagnosis not present

## 2022-02-28 DIAGNOSIS — M25511 Pain in right shoulder: Secondary | ICD-10-CM | POA: Diagnosis not present

## 2022-02-28 DIAGNOSIS — I10 Essential (primary) hypertension: Secondary | ICD-10-CM | POA: Diagnosis not present

## 2022-02-28 DIAGNOSIS — M79601 Pain in right arm: Secondary | ICD-10-CM | POA: Diagnosis not present

## 2022-02-28 DIAGNOSIS — R42 Dizziness and giddiness: Secondary | ICD-10-CM | POA: Diagnosis not present

## 2022-03-09 ENCOUNTER — Other Ambulatory Visit: Payer: Self-pay

## 2022-03-19 ENCOUNTER — Other Ambulatory Visit: Payer: Self-pay | Admitting: Cardiology

## 2022-03-20 DIAGNOSIS — E0500 Thyrotoxicosis with diffuse goiter without thyrotoxic crisis or storm: Secondary | ICD-10-CM | POA: Diagnosis not present

## 2022-03-24 ENCOUNTER — Other Ambulatory Visit: Payer: Self-pay | Admitting: Medical

## 2022-04-10 DIAGNOSIS — M79603 Pain in arm, unspecified: Secondary | ICD-10-CM | POA: Diagnosis not present

## 2022-04-10 DIAGNOSIS — R3129 Other microscopic hematuria: Secondary | ICD-10-CM | POA: Diagnosis not present

## 2022-04-10 DIAGNOSIS — I517 Cardiomegaly: Secondary | ICD-10-CM | POA: Diagnosis not present

## 2022-04-10 DIAGNOSIS — I4891 Unspecified atrial fibrillation: Secondary | ICD-10-CM | POA: Diagnosis not present

## 2022-04-10 DIAGNOSIS — R410 Disorientation, unspecified: Secondary | ICD-10-CM | POA: Diagnosis not present

## 2022-04-10 DIAGNOSIS — R001 Bradycardia, unspecified: Secondary | ICD-10-CM | POA: Diagnosis not present

## 2022-04-10 DIAGNOSIS — I4510 Unspecified right bundle-branch block: Secondary | ICD-10-CM | POA: Diagnosis not present

## 2022-04-10 DIAGNOSIS — R42 Dizziness and giddiness: Secondary | ICD-10-CM | POA: Diagnosis not present

## 2022-04-10 DIAGNOSIS — R0602 Shortness of breath: Secondary | ICD-10-CM | POA: Diagnosis not present

## 2022-04-10 DIAGNOSIS — J9811 Atelectasis: Secondary | ICD-10-CM | POA: Diagnosis not present

## 2022-04-10 DIAGNOSIS — M25511 Pain in right shoulder: Secondary | ICD-10-CM | POA: Diagnosis not present

## 2022-04-10 DIAGNOSIS — R110 Nausea: Secondary | ICD-10-CM | POA: Diagnosis not present

## 2022-04-10 DIAGNOSIS — M25512 Pain in left shoulder: Secondary | ICD-10-CM | POA: Diagnosis not present

## 2022-04-10 DIAGNOSIS — R7989 Other specified abnormal findings of blood chemistry: Secondary | ICD-10-CM | POA: Diagnosis not present

## 2022-04-10 DIAGNOSIS — R531 Weakness: Secondary | ICD-10-CM | POA: Diagnosis not present

## 2022-04-10 DIAGNOSIS — R000 Tachycardia, unspecified: Secondary | ICD-10-CM | POA: Diagnosis not present

## 2022-04-11 DIAGNOSIS — I4510 Unspecified right bundle-branch block: Secondary | ICD-10-CM | POA: Diagnosis not present

## 2022-04-11 DIAGNOSIS — I4891 Unspecified atrial fibrillation: Secondary | ICD-10-CM | POA: Diagnosis not present

## 2022-04-21 ENCOUNTER — Encounter: Payer: Self-pay | Admitting: Nurse Practitioner

## 2022-04-21 ENCOUNTER — Ambulatory Visit: Payer: Medicare HMO | Attending: General Practice | Admitting: Nurse Practitioner

## 2022-04-21 ENCOUNTER — Ambulatory Visit (INDEPENDENT_AMBULATORY_CARE_PROVIDER_SITE_OTHER): Payer: Medicare HMO

## 2022-04-21 VITALS — BP 134/80 | HR 56 | Ht 75.0 in | Wt 276.6 lb

## 2022-04-21 DIAGNOSIS — E785 Hyperlipidemia, unspecified: Secondary | ICD-10-CM | POA: Diagnosis not present

## 2022-04-21 DIAGNOSIS — I25118 Atherosclerotic heart disease of native coronary artery with other forms of angina pectoris: Secondary | ICD-10-CM | POA: Diagnosis not present

## 2022-04-21 DIAGNOSIS — R42 Dizziness and giddiness: Secondary | ICD-10-CM | POA: Diagnosis not present

## 2022-04-21 DIAGNOSIS — I4821 Permanent atrial fibrillation: Secondary | ICD-10-CM

## 2022-04-21 DIAGNOSIS — E063 Autoimmune thyroiditis: Secondary | ICD-10-CM | POA: Diagnosis not present

## 2022-04-21 DIAGNOSIS — I10 Essential (primary) hypertension: Secondary | ICD-10-CM

## 2022-04-21 DIAGNOSIS — R001 Bradycardia, unspecified: Secondary | ICD-10-CM

## 2022-04-21 DIAGNOSIS — I350 Nonrheumatic aortic (valve) stenosis: Secondary | ICD-10-CM

## 2022-04-21 DIAGNOSIS — I5032 Chronic diastolic (congestive) heart failure: Secondary | ICD-10-CM | POA: Diagnosis not present

## 2022-04-21 DIAGNOSIS — E118 Type 2 diabetes mellitus with unspecified complications: Secondary | ICD-10-CM

## 2022-04-21 NOTE — Patient Instructions (Signed)
Medication Instructions:  Your physician recommends that you continue on your current medications as directed. Please refer to the Current Medication list given to you today.   *If you need a refill on your cardiac medications before your next appointment, please call your pharmacy*   Lab Work: NONE ordered at this time of appointment   If you have labs (blood work) drawn today and your tests are completely normal, you will receive your results only by: [place] (if you have MyChart) OR A paper copy in the mail If you have any lab test that is abnormal or we need to change your treatment, we will call you to review the results.   Testing/Procedures: ZIO AT Long term monitor-Live Telemetry  Your physician has requested you wear a ZIO patch monitor for 14 days.  This is a single patch monitor. [employer] supplies one patch monitor per enrollment. Additional  stickers are not available.  Please do not apply patch if you will be having a Nuclear Stress Test, Echocardiogram, Cardiac CT, MRI,  or Chest Xray during the period you would be wearing the monitor. The patch cannot be worn during  these tests. You cannot remove and re-apply the ZIO AT patch monitor.  Your ZIO patch monitor will be mailed 3 day USPS to your address on file. It may take 3-5 days to  receive your monitor after you have been enrolled.  Once you have received your monitor, please review the enclosed instructions. Your monitor has  already been registered assigning a specific monitor serial # to you.   Billing and Patient Assistance Program information  Michael Whitney has been supplied with any insurance information on record for billing. [employer] offers a sliding scale Patient Assistance Program for patients without insurance, or whose  insurance does not completely cover the cost of the ZIO patch monitor. You must apply for the  Patient Assistance Program to qualify for the discounted rate. To apply, call [employer] at  [phone],  select option 4, select option 2 , ask to apply for the Patient Assistance Program, (you can request an  interpreter if needed). [employer] will ask your household income and how many people are in your  household. [employer] will quote your out-of-pocket cost based on this information. They will also be able  to set up a 12 month interest free payment plan if needed.  Applying the monitor   Shave hair from upper left chest.  Hold the abrader disc by orange tab. Rub the abrader in 40 strokes over left upper chest as indicated in  your monitor instructions.  Clean area with 4 enclosed alcohol pads. Use all pads to ensure the area is cleaned thoroughly. Let  dry.  Apply patch as indicated in monitor instructions. Patch will be placed under collarbone on left side of  chest with arrow pointing upward.  Rub patch adhesive wings for 2 minutes. Remove the white label marked "1". Remove the white label  marked "2". Rub patch adhesive wings for 2 additional minutes.  While looking in a mirror, press and release button in center of patch. A small green light will flash 3-4  times. This will be your only indicator that the monitor has been turned on.  Do not shower for the first 24 hours. You may shower after the first 24 hours.  Press the button if you feel a symptom. You will hear a small click. Record Date, Time and Symptom in  the Patient Log.   Starting the [employer]  In your kit there is a small plastic box the size of a cellphone. This is Airline pilot. It transmits all your  recorded data to [facility]. This box must always stay within 10 feet of you. Open the box and push the *  button. There will be a light that blinks orange and then green a few times. When the light stops  blinking, the [place] is connected to the ZIO patch. Call [employer] at [phone] to confirm your monitor is transmitting.  Returning your monitor  Remove your patch and place it inside the [place]. In the  lower half of the Gateway there is a white  bag with prepaid postage on it. Place Gateway in bag and seal. Mail package back to [place] as soon as  possible. Your physician should have your final report approximately 7 days after you have mailed back  your monitor. Call [place] at [phone] if you have questions regarding your ZIO AT  patch monitor. Call them immediately if you see an orange light blinking on your monitor.  If your monitor falls off in less than 4 days, contact our Monitor department at [phone]. If your  monitor becomes loose or falls off after 4 days call [employer] at [phone] for suggestions on  securing your monitor    Follow-Up: At [facility], you and your health needs are our priority.  As part of our continuing mission to provide you with exceptional heart care, we have created designated Provider Care Teams.  These Care Teams include your primary Cardiologist (physician) and Advanced Practice Providers (APPs -  Physician Assistants and Nurse Practitioners) who all work together to provide you with the care you need, when you need it.  We recommend signing up for the patient portal called "MyChart".  Sign up information is provided on this After Visit Summary.  MyChart is used to connect with patients for Virtual Visits (Telemedicine).  Patients are able to view lab/test results, encounter notes, upcoming appointments, etc.  Non-urgent messages can be sent to your provider as well.   To learn more about what you can do with MyChart, go to [email or web address].    Your next appointment:    Keep follow up   Provider:   Peter [name], MD     Other Instructions

## 2022-04-21 NOTE — Progress Notes (Unsigned)
Enrolled patient for a 14 day Zio XT monitor to be mailed to patients home  [name] to read

## 2022-04-21 NOTE — Progress Notes (Signed)
Office Visit    Patient Name: Michael Whitney Date of Encounter: 04/21/2022  Primary Care Provider:  Mackie Pai, PA-C Primary Cardiologist:  Peter [name], MD  Chief Complaint    72 year old male with a history of permanent atrial fibrillation, nonobstructive CAD, chronic diastolic heart failure, hypertension, GERD, valvular heart disease (MR/TR), seizure, type 2 diabetes, chronic dizziness, hypothyroidism/ Hashimoto thyroiditis, and OSA who presents for hospital follow-up related to atrial fibrillation.    Past Medical History    Past Medical History:  Diagnosis Date   Atrial fibrillation ([place])    Cataract    Diabetes mellitus without complication ([place])    Hypertension    Seizures ([place])    Thought to be related to something his GF gave him   Past Surgical History:  Procedure Laterality Date   CATARACT EXTRACTION     PVC ABLATION N/A 07/14/2017   Procedure: PVC ABLATION;  Surgeon: Evans Lance, MD;  Location: [facility];  Service: Cardiovascular;  Laterality: N/A;   RIGHT/LEFT HEART CATH AND CORONARY ANGIOGRAPHY N/A 10/24/2020   Procedure: RIGHT/LEFT HEART CATH AND CORONARY ANGIOGRAPHY;  Surgeon: [name], Peter M, MD;  Location: [facility];  Service: Cardiovascular;  Laterality: N/A;   TONSILLECTOMY     VARICOSE VEIN SURGERY      Allergies  No Known Allergies   Labs/Other Studies Reviewed    The following studies were reviewed today: Echo 10/02/20: The left ventricular size is normal. There is severe concentric left  ventricular hypertrophy. The left ventricular wall motion is normal.  Left ventricular systolic function is normal. LV ejection fraction =  55-60%.  Left ventricular filling pattern is pseudonormal. LAP is  indeterminate.  The right ventricle is moderate to severely dilated. The right  ventricular systolic function is moderately reduced.  The atria are severely dilated.  There is mild to moderate mitral regurgitation.  There is  moderate tricuspid regurgitation.  Mild to moderate pulmonary hypertension.  Estimated right ventricular systolic pressure is 46 mmHg.  The IVC is dilated with an abnormal collapsibility index, this  suggestive of increased right atrial pressure.   Progression of RV changes and atrial dilation compared to 2020 study.    R/LHC 10/2020: Conclusion       Ost LM to Mid LM lesion is 30% stenosed.   Prox LAD lesion is 30% stenosed.   Mid Cx lesion is 45% stenosed.   Prox RCA to Mid RCA lesion is 30% stenosed.   The left ventricular systolic function is normal.   LV end diastolic pressure is mildly elevated.   The left ventricular ejection fraction is 55-65% by visual estimate.   Hemodynamic findings consistent with mild pulmonary hypertension.   There is no aortic valve stenosis.   There is trivial (1+) mitral regurgitation.   Nonobstructive CAD Minimally elevated left heart filling pressures.  Mild pulmonary HTN. Normal cardiac output.   Plan: patient is on appropriate diuretic therapy (considering he was hydrated prior to cath). Would consider evaluating possible pulmonary cause of his dyspnea. He does have some component of diastolic CHF. May consider PYP scan to evaluate for possible amyloid  Echo 12/12/20: IMPRESSIONS    1. Left ventricular ejection fraction, by estimation, is 50 to 55%. The  left ventricle has low normal function. The left ventricle has no regional  wall motion abnormalities. There is mild left ventricular hypertrophy.  Left ventricular diastolic function   could not be evaluated.   2. Right ventricular systolic function is  mildly reduced. The right  ventricular size is normal.   3. Left atrial size was severely dilated.   4. Right atrial size was moderately dilated.   5. The mitral valve is normal in structure. No evidence of mitral valve  regurgitation. No evidence of mitral stenosis.   6. The aortic valve is tricuspid. There is severe calcifcation of the   aortic valve. Aortic valve regurgitation is trivial. The aortic valve is  moderately to severely calcified with markedly reduced cusp excursion. By  Doppler parameters stensosis is  likely moderate but visually appears more severe.   7. The inferior vena cava is dilated in size with >50% respiratory  variability, suggesting right atrial pressure of 8 mmHg.    Echo 12/12/21: IMPRESSIONS   1. Heavily calcified aortic valve with probable moderate AS (mean  gradient 9 mmHg; AVA 1.04 cm2; DI 0.46; visually appears at least  moderate).   2. Left ventricular ejection fraction, by estimation, is 50 to 55%. The  left ventricle has low normal function. The left ventricle has no regional  wall motion abnormalities. There is mild concentric left ventricular  hypertrophy. Left ventricular  diastolic function could not be evaluated.   3. Right ventricular systolic function is normal. The right ventricular  size is normal.   4. Left atrial size was mildly dilated.   5. Right atrial size was mildly dilated.   6. The mitral valve is normal in structure. Trivial mitral valve  regurgitation. No evidence of mitral stenosis.   7. The aortic valve is calcified. Aortic valve regurgitation is mild.  Moderate aortic valve stenosis.   8. The inferior vena cava is normal in size with greater than 50%  respiratory variability, suggesting right atrial pressure of 3 mmHg.   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  He was diagnosed with atrial fibrillation in 2019.  EP study by Dr. Lovena Le revealed PVCs arising near his bundle and ablation was not recommended due to potential for heart block.  He has been maintained on a rate control strategy with anticoagulation therapy.  He has a longstanding history of chronic dizziness unrelated to changes in blood pressure or heart rate.  He was evaluated at [facility] in 09/2020 in the setting of weakness, dyspnea on exertion, chest pain, dizziness, lower extremity edema.  Echocardiogram showed severe BAE, but EF 55 to 60%, moderately to severely dilated RV, RVSP moderately reduced, mild to moderate MR, moderate TR, mild to moderate pulmonary hypertension.  He underwent diuresis. Right and left cardiac catheterization in 10/2020 showed nonobstructive CAD, minimally elevated left heart filling pressures, mild pulmonary hypertension, normal cardiac output.  He returned to the ED on 12/12/2020 for further evaluation of chronic dizziness.  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  MRI of the brain was negative for acute findings.  Vascular surgery was consulted. It was felt his vertebral stenosis was not causing his dizziness.  Echocardiogram was unchanged at the time.  Most recent echo in 11/2021 showed EF 50 to 55%, low normal LV function, no RWMA, mild concentric LVH, normal RV systolic function, mild BAE, trivial mitral valve regurgitation, mild aortic valve regurgitation, moderate aortic valve stenosis, unchanged from  prior.  He was last seen in the office on 01/02/2022 and was stable from a cardiac standpoint.  He did note ongoing dizziness.  He presented to the ED at [facility] on 04/10/2022 with generalized weakness, nausea.  EKG showed slow atrial fibrillation. CK was normal. Troponin was elevated, however, this has been noted to be chronically elevated.  He denied symptoms concerning for angina.  Declined admission.  Chest x-ray was unremarkable.  It was felt that his bradycardia was likely contributing to his symptoms.  Outpatient follow-up with cardiology was recommended.  He presents today for follow-up. Since his last visit he notes ongoing generalized weakness, as well as a sensation that begins in the back of his head and "wraps around like a wave" with associated lightheadedness.  He also notes double vision at times. He denies symptoms concerning for angina but notes a several week history of soreness in his arms bilaterally, tender to palpation, as well as difficulty raising his arms above his head.  Additionally, he has had a new rash, peeling of skin that is covering his head and arms bilaterally.   He denies any chest pain, dyspnea, edema, PND, orthopnea, weight gain.  He thinks his symptoms are largely being driven by his Hashimoto's thyroiditis.  He plans to schedule follow-up with endocrinology.    Home Medications    Current Outpatient Medications  Medication Sig Dispense Refill   Accu-Chek FastClix Lancets MISC Use 1 lancet daily to test blood sugar (E11.9)     amLODipine (NORVASC) 2.5 MG tablet TAKE 1 TABLET(2.5 MG) BY MOUTH DAILY 90 tablet 3   ammonium lactate (AMLACTIN) 12 % cream Apply 1 application. topically as needed for dry skin. 385 g 0   azelastine (ASTELIN) 0.1 % nasal spray Place 2 sprays into both nostrils 2 (two) times daily. Use in each nostril as directed 30 mL 0   Cholecalciferol (VITAMIN D-3) 25 MCG (1000 UT) CAPS Take 1,000 Units by mouth daily.     JANUVIA 25 MG  tablet TAKE 1 TABLET(25 MG) BY MOUTH DAILY 30 tablet 3   levothyroxine (SYNTHROID) 50 MCG tablet Take 1 tablet (50 mcg total) by mouth daily. 90 tablet 3   losartan (COZAAR) 100 MG tablet TAKE 1 TABLET(100 MG) BY MOUTH DAILY 90 tablet 3   Multiple Vitamin (MULTI-VITAMIN) tablet Take 1 tablet by mouth daily.     Omega-3 Fatty Acids (FISH OIL) 1000 MG CAPS Take 1,000 mg by mouth daily.     OVER THE COUNTER MEDICATION Take 6 drops by mouth daily. Vitamins A, D, and K     rivaroxaban (XARELTO) 20 MG TABS tablet Take 1 tablet (20 mg total) by mouth daily with supper. 90 tablet 0   Teprotumumab-trbw (TEPEZZA IV) Inject into the vein. Every 3 weeks.     triamcinolone cream (KENALOG) 0.1 % Apply 1 application topically 3 (three) times a week.  vitamin B-12 (CYANOCOBALAMIN) 1000 MCG tablet Take 1,000 mcg by mouth daily.     Vitamin D, Ergocalciferol, (DRISDOL) 1.25 MG (50000 UNIT) CAPS capsule Take 1 capsule (50,000 Units total) by mouth every 7 (seven) days. 5 capsule 12   No current facility-administered medications for this visit.     Review of Systems    He denies chest pain, palpitations, dyspnea, pnd, orthopnea, n, v, syncope, edema, weight gain, or early satiety. All other systems reviewed and are otherwise negative except as noted above.   Physical Exam    VS:  BP 134/80 (BP Location: Left Arm, Patient Position: Sitting, Cuff Size: Large)   Pulse (!) 56   Ht '6\' 3"'$  (1.905 m)   Wt 276 lb 9.6 oz (125.5 kg)   SpO2 97%   BMI 34.57 kg/m  GEN: Well nourished, well developed, in no acute distress. HEENT: normal. Neck: Supple, no JVD, carotid bruits, or masses. Cardiac: IRIR, 2/6 murmur, no rubs, or gallops. No clubbing, cyanosis, edema.  Radials/DP/PT 2+ and equal bilaterally.  Respiratory:  Respirations regular and unlabored, clear to auscultation bilaterally. GI: Soft, nontender, nondistended, BS + x 4. MS: no deformity or atrophy. Skin: warm and dry, no rash. Neuro:  Strength and  sensation are intact. Psych: Normal affect.  Accessory Clinical Findings    ECG personally reviewed by me today -atrial fibrillation with slow ventricular response, 56 bpm, all- no acute changes.   Lab Results  Component Value Date   WBC 7.2 02/03/2022   HGB 16.8 02/03/2022   HCT 49.7 02/03/2022   MCV 102.6 (H) 02/03/2022   PLT 132.0 (L) 02/03/2022   Lab Results  Component Value Date   CREATININE 1.22 02/03/2022   BUN 27 (H) 02/03/2022   NA 137 02/03/2022   K 4.8 02/03/2022   CL 102 02/03/2022   CO2 26 02/03/2022   Lab Results  Component Value Date   ALT 28 02/03/2022   AST 23 02/03/2022   ALKPHOS 37 (L) 02/03/2022   BILITOT 0.7 02/03/2022   Lab Results  Component Value Date   CHOL 209 (H) 10/29/2021   HDL 41.10 10/29/2021   LDLCALC 140 (H) 10/29/2021   TRIG 138.0 10/29/2021   CHOLHDL 5 10/29/2021    Lab Results  Component Value Date   HGBA1C 7.2 (H) 10/29/2021    Assessment & Plan    1. Permanent atrial fibrillation/bradycardia: Recent bradycardia.  He notes ongoing generalized weakness, fatigue, intermittent lightheadedness.  Patient thinks his symptoms are likely related to his thyroid/Hashimoto's thyroiditis.  Recent ED evaluation for symptoms.  Previously evaluated by EP who felt that PPM was not indicated.  Discussed with Dr. [name].  Will repeat 14-day ZIO.  Will defer EP evaluation at this time.  Continue Xarelto.  2. Chronic dizziness: Stable overall.  Also notes intermittent double vision.  Denies presyncope, syncope.  Prior vascular and neurologic evaluation have been unremarkable.  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  Repeat monitor pending as above.  3. Aortic stenosis: Stable, moderate on most recent echo in 11/2021.  Will plan for repeat echo around 11/2022.  4. Nonobstructive CAD: Cath in 2022 showed mild nonobstructive CAD.  He has a chronically elevated troponin.  EKG without ischemic changes.  Denies symptoms concerning  for angina.  No ASA in the setting of chronic DOAC therapy.  No beta-blocker in the setting of baseline bradycardia.  He has declined statin therapy.  5. Chronic diastolic heart failure: Echo in 11/2021 showed EF 50  to 55%, mildly elevated LV filling pressures on prior cath.  Euvolemic and well compensated on exam.  No longer requiring Lasix.  6. Hypertension: BP well controlled. Continue current antihypertensive regimen.   7. Hyperlipidemia: LDL was 140 in 10/2021.  Patient has declined statin therapy.  8. Type 2 diabetes: A1c was 7.2 in 10/2021.  Monitored and managed per PCP.  9. Hypothyroidism/Hashimoto thyroiditis: Given ongoing symptoms of generalized weakness, fatigue, recommend follow-up with endocrinology.  10. Disposition: Follow-up as scheduled with Dr. [name] in 10/2022 (patient declined sooner appointment).     Lenna Sciara, NP 04/21/2022, 1:08 PM

## 2022-04-26 DIAGNOSIS — R001 Bradycardia, unspecified: Secondary | ICD-10-CM

## 2022-04-26 DIAGNOSIS — I4821 Permanent atrial fibrillation: Secondary | ICD-10-CM

## 2022-04-28 DIAGNOSIS — E0500 Thyrotoxicosis with diffuse goiter without thyrotoxic crisis or storm: Secondary | ICD-10-CM | POA: Diagnosis not present

## 2022-04-29 DIAGNOSIS — E1165 Type 2 diabetes mellitus with hyperglycemia: Secondary | ICD-10-CM | POA: Diagnosis not present

## 2022-04-29 DIAGNOSIS — R2689 Other abnormalities of gait and mobility: Secondary | ICD-10-CM | POA: Diagnosis not present

## 2022-04-29 DIAGNOSIS — H5010 Unspecified exotropia: Secondary | ICD-10-CM | POA: Diagnosis not present

## 2022-04-29 DIAGNOSIS — Z961 Presence of intraocular lens: Secondary | ICD-10-CM | POA: Diagnosis not present

## 2022-04-29 DIAGNOSIS — H52203 Unspecified astigmatism, bilateral: Secondary | ICD-10-CM | POA: Diagnosis not present

## 2022-04-29 DIAGNOSIS — H5213 Myopia, bilateral: Secondary | ICD-10-CM | POA: Diagnosis not present

## 2022-04-29 DIAGNOSIS — H43813 Vitreous degeneration, bilateral: Secondary | ICD-10-CM | POA: Diagnosis not present

## 2022-04-29 DIAGNOSIS — G4733 Obstructive sleep apnea (adult) (pediatric): Secondary | ICD-10-CM | POA: Diagnosis not present

## 2022-04-29 DIAGNOSIS — H524 Presbyopia: Secondary | ICD-10-CM | POA: Diagnosis not present

## 2022-04-30 ENCOUNTER — Telehealth: Payer: Self-pay

## 2022-04-30 ENCOUNTER — Ambulatory Visit (INDEPENDENT_AMBULATORY_CARE_PROVIDER_SITE_OTHER): Payer: Medicare HMO | Admitting: Family Medicine

## 2022-04-30 VITALS — BP 130/80 | HR 62 | Temp 98.0°F | Resp 16 | Ht 75.0 in | Wt 278.8 lb

## 2022-04-30 DIAGNOSIS — R21 Rash and other nonspecific skin eruption: Secondary | ICD-10-CM

## 2022-04-30 DIAGNOSIS — J449 Chronic obstructive pulmonary disease, unspecified: Secondary | ICD-10-CM | POA: Diagnosis not present

## 2022-04-30 DIAGNOSIS — E063 Autoimmune thyroiditis: Secondary | ICD-10-CM

## 2022-04-30 DIAGNOSIS — I152 Hypertension secondary to endocrine disorders: Secondary | ICD-10-CM

## 2022-04-30 DIAGNOSIS — E0590 Thyrotoxicosis, unspecified without thyrotoxic crisis or storm: Secondary | ICD-10-CM

## 2022-04-30 MED ORDER — HYDROXYZINE PAMOATE 25 MG PO CAPS: 25.0000 mg | ORAL_CAPSULE | Freq: Every evening | ORAL | 0 refills | Status: DC | PRN

## 2022-04-30 MED ORDER — MOMETASONE FUROATE 0.1 % EX CREA: TOPICAL_CREAM | Freq: Every day | CUTANEOUS | 1 refills | Status: AC | PRN

## 2022-04-30 MED ORDER — FAMOTIDINE 20 MG PO TABS: 20.0000 mg | ORAL_TABLET | Freq: Two times a day (BID) | ORAL | 3 refills | Status: DC

## 2022-04-30 MED ORDER — CETIRIZINE HCL 10 MG PO TABS: 10.0000 mg | ORAL_TABLET | Freq: Two times a day (BID) | ORAL | 3 refills | Status: DC

## 2022-04-30 MED ORDER — AMLODIPINE BESYLATE 5 MG PO TABS: 5.0000 mg | ORAL_TABLET | Freq: Every day | ORAL | 2 refills | Status: DC

## 2022-04-30 MED ORDER — METHYLPREDNISOLONE 4 MG PO TABS: 4.0000 mg | ORAL_TABLET | Freq: Every day | ORAL | 0 refills | Status: DC

## 2022-04-30 NOTE — Patient Instructions (Signed)
Stop losartan and increase Amlodipine to 5 mg daily (you are currently taking 2.5 mg daily) Take the Cetirizine and Famotidine twice daily for at leaset the next month Stop the hydroxyzine as soon as the rash and itching are no longer disrupting your sleep Medrol is only for 5 days Cream just as needed  Rash, Adult A rash is a change in the color of your skin. A rash can also change the way your skin feels. There are many different conditions and factors that can cause a rash. Some rashes may disappear after a few days, but some may last for a few weeks. Common causes of rashes include: Viral infections, such as: Colds. Measles. Hand, foot, and mouth disease. Bacterial infections, such as: Scarlet fever. Impetigo. Fungal infections, such as Candida. Allergic reactions to food, medicines, or skin care products. Follow these instructions at home: The goal of treatment is to stop the itching and keep the rash from spreading. Pay attention to any changes in your symptoms. Follow these instructions to help with your condition: Medicine Take or apply over-the-counter and prescription medicines only as told by your health care provider. These may include: Corticosteroid creams to treat red or swollen skin. Anti-itch lotions. Oral allergy medicines (antihistamines). Oral corticosteroids for severe symptoms.  Skin care Apply cool compresses to the affected areas. Do not scratch or rub your skin. Avoid covering the rash. Make sure the rash is exposed to air as much as possible. Managing itching and discomfort Avoid hot showers or baths, which can make itching worse. A cold shower may help. Try taking a bath with: Epsom salts. Follow manufacturer instructions on the packaging. You can get these at your local pharmacy or grocery store. Baking soda. Pour a small amount into the bath as told by your health care provider. Colloidal oatmeal. Follow manufacturer instructions on the packaging. You  can get this at your local pharmacy or grocery store. Try applying baking soda paste to your skin. Stir water into baking soda until it reaches a paste-like consistency. Try applying calamine lotion. This is an over-the-counter lotion that helps to relieve itchiness. Keep cool and out of the sun. Sweating and being hot can make itching worse. General instructions  Rest as needed. Drink enough fluid to keep your urine pale yellow. Wear loose-fitting clothing. Avoid scented soaps, detergents, and perfumes. Use gentle soaps, detergents, perfumes, and other cosmetic products. Avoid any substance that causes your rash. Keep a journal to help track what causes your rash. Write down: What you eat. What cosmetic products you use. What you drink. What you wear. This includes jewelry. Keep all follow-up visits as told by your health care provider. This is important. Contact a health care provider if: You sweat at night. You lose weight. You urinate more than normal. You urinate less than normal, or you notice that your urine is a darker color than usual. You feel weak. You vomit. Your skin or the whites of your eyes look yellow (jaundice). Your skin: Tingles. Is numb. Your rash: Does not go away after several days. Gets worse. You are: Unusually thirsty. More tired than normal. You have: New symptoms. Pain in your abdomen. A fever. Diarrhea. Get help right away if you: Have a fever and your symptoms suddenly get worse. Develop confusion. Have a severe headache or a stiff neck. Have severe joint pains or stiffness. Have a seizure. Develop a rash that covers all or most of your body. The rash may or may not be painful.  Develop blisters that: Are on top of the rash. Grow larger or grow together. Are painful. Are inside your nose or mouth. Develop a rash that: Looks like purple pinprick-sized spots all over your body. Has a "bull's eye" or looks like a target. Is not related to  sun exposure, is red and painful, and causes your skin to peel. Summary A rash is a change in the color of your skin. Some rashes disappear after a few days, but some may last for a few weeks. The goal of treatment is to stop the itching and keep the rash from spreading. Take or apply over-the-counter and prescription medicines only as told by your health care provider. Contact a health care provider if you have new or worsening symptoms. Keep all follow-up visits as told by your health care provider. This is important. This information is not intended to replace advice given to you by your health care provider. Make sure you discuss any questions you have with your health care provider. Document Revised: 08/12/2021 Document Reviewed: 11/21/2020 Elsevier Patient Education  [employer].

## 2022-04-30 NOTE — Telephone Encounter (Signed)
Received call from Pt- he is at the pharmacy stating pharmacist wasn't giving him the amlodipine because the amlodipine could be causing his rash. I spoke w/ the pharmacist and gave them verbal orders to proceed with medication because he was seen today and evaluated.

## 2022-04-30 NOTE — Progress Notes (Signed)
Subjective:   By signing my name below, I, [name], attest that this documentation has been prepared under the direction and in the presence of Mosie Lukes, MD., 04/30/2022.   Patient ID: Michael Whitney, male    DOB: 07/26/1950, 72 y.o.   MRN: [id number]  Chief Complaint  Patient presents with   Rash     Here for Rash on skin   HPI Patient is in today for an office visit. He denies CP/palpitations/SOB/HA/congestion/ fever/chills/GI or GU symptoms.  Rash Patient presents today with severe rash bilaterally across his arms/legs that appeared 6 or more months ago. He complains of itching that is keeping him awake at night. He reports no changes to his bedding, clothes, lotions, or shampoos since the onset of his rash. He has seen [facility] but found this unhelpful. He is requesting a referral to a new dermatologist. He occasionally applies hydrocortisone cream which provides temporary relief. He has associated symptoms of left shoulder pain, dizziness, and double vision. He was seen by ophthalmologist Dr. Laverda Sorenson on 03/20/2022 who related his symptoms to thyroid disease, which he was diagnosed with 7 years ago. He has history of Hashimoto's disease. He continues taking Levothyroxine 50 mcg but has stopped taking Tepezza due to worsening symptoms. He is also taking Losartan 100 mg to manage hypertension which is possibly worsening his rash.  Past Medical History:  Diagnosis Date   Atrial fibrillation ([place])    Cataract    Diabetes mellitus without complication ([place])    Hypertension    Seizures ([place])    Thought to be related to something his GF gave him   Past Surgical History:  Procedure Laterality Date   CATARACT EXTRACTION     PVC ABLATION N/A 07/14/2017   Procedure: PVC ABLATION;  Surgeon: Evans Lance, MD;  Location: [place] [facility];  Service: Cardiovascular;  Laterality: N/A;   RIGHT/LEFT HEART CATH AND CORONARY ANGIOGRAPHY N/A 10/24/2020   Procedure:  RIGHT/LEFT HEART CATH AND CORONARY ANGIOGRAPHY;  Surgeon: [name], Peter M, MD;  Location: [place] [facility];  Service: Cardiovascular;  Laterality: N/A;   TONSILLECTOMY     VARICOSE VEIN SURGERY      Family History  Problem Relation Age of Onset   CAD Mother        MI at age 73    Social History   Socioeconomic History   Marital status: Widowed    Spouse name: Not on file   Number of children: 2   Years of education: Not on file   Highest education level: Not on file  Occupational History   Occupation: retired    Comment: Geophysicist/field seismologist  Tobacco Use   Smoking status: Never   Smokeless tobacco: Never  Vaping Use   Vaping Use: Never used  Substance and Sexual Activity   Alcohol use: Yes    Alcohol/week: 2.0 standard drinks of alcohol    Types: 1 Shots of liquor, 1 Cans of beer per week    Comment: occasionally   Drug use: No   Sexual activity: Not on file  Other Topics Concern   Not on file  Social History Narrative   Not on file   Social Determinants of Health   Financial Resource Strain: Low Risk  (08/18/2021)   Overall Financial Resource Strain (CARDIA)    Difficulty of Paying Living Expenses: Not hard at all  Food Insecurity: No Food Insecurity (08/18/2021)   Hunger Vital Sign    Worried About Running  Out of Food in the Last Year: Never true    Ran Out of Food in the Last Year: Never true  Transportation Needs: No Transportation Needs (08/18/2021)   PRAPARE - Hydrologist (Medical): No    Lack of Transportation (Non-Medical): No  Physical Activity: Sufficiently Active (08/18/2021)   Exercise Vital Sign    Days of Exercise per Week: 4 days    Minutes of Exercise per Session: 60 min  Stress: No Stress Concern Present (08/18/2021)   [place]    Feeling of Stress : Not at all  Social Connections: Socially Isolated (08/18/2021)   Social Connection and Isolation Panel  [NHANES]    Frequency of Communication with Friends and Family: More than three times a week    Frequency of Social Gatherings with Friends and Family: Twice a week    Attends Religious Services: Never    Marine scientist or Organizations: No    Attends Archivist Meetings: Never    Marital Status: Widowed  Intimate Partner Violence: Not At Risk (08/18/2021)   Humiliation, Afraid, Rape, and Kick questionnaire    Fear of Current or Ex-Partner: No    Emotionally Abused: No    Physically Abused: No    Sexually Abused: No    Outpatient Medications Prior to Visit  Medication Sig Dispense Refill   Accu-Chek FastClix Lancets MISC Use 1 lancet daily to test blood sugar (E11.9)     ammonium lactate (AMLACTIN) 12 % cream Apply 1 application. topically as needed for dry skin. 385 g 0   azelastine (ASTELIN) 0.1 % nasal spray Place 2 sprays into both nostrils 2 (two) times daily. Use in each nostril as directed 30 mL 0   Cholecalciferol (VITAMIN D-3) 25 MCG (1000 UT) CAPS Take 1,000 Units by mouth daily.     JANUVIA 25 MG tablet TAKE 1 TABLET(25 MG) BY MOUTH DAILY 30 tablet 3   levothyroxine (SYNTHROID) 50 MCG tablet Take 1 tablet (50 mcg total) by mouth daily. 90 tablet 3   Multiple Vitamin (MULTI-VITAMIN) tablet Take 1 tablet by mouth daily.     Omega-3 Fatty Acids (FISH OIL) 1000 MG CAPS Take 1,000 mg by mouth daily.     OVER THE COUNTER MEDICATION Take 6 drops by mouth daily. Vitamins A, D, and K     rivaroxaban (XARELTO) 20 MG TABS tablet Take 1 tablet (20 mg total) by mouth daily with supper. 90 tablet 0   Teprotumumab-trbw (TEPEZZA IV) Inject into the vein. Every 3 weeks.     vitamin B-12 (CYANOCOBALAMIN) 1000 MCG tablet Take 1,000 mcg by mouth daily.     Vitamin D, Ergocalciferol, (DRISDOL) 1.25 MG (50000 UNIT) CAPS capsule Take 1 capsule (50,000 Units total) by mouth every 7 (seven) days. 5 capsule 12   amLODipine (NORVASC) 2.5 MG tablet TAKE 1 TABLET(2.5 MG) BY MOUTH DAILY  90 tablet 3   losartan (COZAAR) 100 MG tablet TAKE 1 TABLET(100 MG) BY MOUTH DAILY 90 tablet 3   triamcinolone cream (KENALOG) 0.1 % Apply 1 application topically 3 (three) times a week.     No facility-administered medications prior to visit.    No Known Allergies  Review of Systems  Constitutional:  Negative for chills and fever.  HENT:  Negative for congestion.   Eyes:  Positive for double vision.  Respiratory:  Negative for shortness of breath.   Cardiovascular:  Negative for chest pain and palpitations.  Gastrointestinal:  Negative for abdominal pain, blood in stool, constipation, diarrhea, nausea and vomiting.  Genitourinary:  Negative for dysuria, frequency, hematuria and urgency.  Musculoskeletal:        (+) left shoulder pain.  Skin:  Positive for itching and rash (bilaterally across arms/legs).          Neurological:  Positive for dizziness. Negative for headaches.      Objective:    Physical Exam Constitutional:      General: He is not in acute distress.    Appearance: Normal appearance. He is normal weight. He is not ill-appearing.  HENT:     Head: Normocephalic and atraumatic.     Right Ear: External ear normal.     Left Ear: External ear normal.     Nose: Nose normal.     Mouth/Throat:     Mouth: Mucous membranes are moist.     Pharynx: Oropharynx is clear.  Eyes:     General:        Right eye: No discharge.        Left eye: No discharge.     Extraocular Movements: Extraocular movements intact.     Conjunctiva/sclera: Conjunctivae normal.     Pupils: Pupils are equal, round, and reactive to light.  Cardiovascular:     Rate and Rhythm: Normal rate and regular rhythm.     Pulses: Normal pulses.     Heart sounds: Normal heart sounds. No murmur heard.    No gallop.  Pulmonary:     Effort: Pulmonary effort is normal. No respiratory distress.     Breath sounds: Normal breath sounds. No wheezing or rales.  Abdominal:     General: Bowel sounds are normal.      Palpations: Abdomen is soft.     Tenderness: There is no abdominal tenderness. There is no guarding.  Musculoskeletal:        General: Normal range of motion.     Cervical back: Normal range of motion.     Right lower leg: No edema.     Left lower leg: No edema.  Skin:    General: Skin is warm and dry.  Neurological:     Mental Status: He is alert and oriented to person, place, and time.  Psychiatric:        Mood and Affect: Mood normal.        Behavior: Behavior normal.        Judgment: Judgment normal.     BP 130/80 (BP Location: Right Arm, Patient Position: Sitting, Cuff Size: Normal)   Pulse 62   Temp 98 F (36.7 C) (Oral)   Resp 16   Ht '6\' 3"'$  (1.905 m)   Wt 278 lb 12.8 oz (126.5 kg)   SpO2 97%   BMI 34.85 kg/m  Wt Readings from Last 3 Encounters:  04/30/22 278 lb 12.8 oz (126.5 kg)  04/21/22 276 lb 9.6 oz (125.5 kg)  02/03/22 288 lb (130.6 kg)    Diabetic Foot Exam - Simple   No data filed    Lab Results  Component Value Date   WBC 7.2 02/03/2022   HGB 16.8 02/03/2022   HCT 49.7 02/03/2022   PLT 132.0 (L) 02/03/2022   GLUCOSE 128 (H) 02/03/2022   CHOL 209 (H) 10/29/2021   TRIG 138.0 10/29/2021   HDL 41.10 10/29/2021   LDLCALC 140 (H) 10/29/2021   ALT 28 02/03/2022   AST 23 02/03/2022   NA 137 02/03/2022   K 4.8 02/03/2022  CL 102 02/03/2022   CREATININE 1.22 02/03/2022   BUN 27 (H) 02/03/2022   CO2 26 02/03/2022   TSH 2.57 02/03/2022   INR 1.2 09/09/2019   HGBA1C 7.2 (H) 10/29/2021    Lab Results  Component Value Date   TSH 2.57 02/03/2022   Lab Results  Component Value Date   WBC 7.2 02/03/2022   HGB 16.8 02/03/2022   HCT 49.7 02/03/2022   MCV 102.6 (H) 02/03/2022   PLT 132.0 (L) 02/03/2022   Lab Results  Component Value Date   NA 137 02/03/2022   K 4.8 02/03/2022   CO2 26 02/03/2022   GLUCOSE 128 (H) 02/03/2022   BUN 27 (H) 02/03/2022   CREATININE 1.22 02/03/2022   BILITOT 0.7 02/03/2022   ALKPHOS 37 (L) 02/03/2022   AST  23 02/03/2022   ALT 28 02/03/2022   PROT 7.0 02/03/2022   ALBUMIN 4.6 02/03/2022   CALCIUM 9.3 02/03/2022   ANIONGAP 7 12/12/2020   EGFR 61 10/18/2020   GFR 58.80 (L) 02/03/2022   Lab Results  Component Value Date   CHOL 209 (H) 10/29/2021   Lab Results  Component Value Date   HDL 41.10 10/29/2021   Lab Results  Component Value Date   LDLCALC 140 (H) 10/29/2021   Lab Results  Component Value Date   TRIG 138.0 10/29/2021   Lab Results  Component Value Date   CHOLHDL 5 10/29/2021   Lab Results  Component Value Date   HGBA1C 7.2 (H) 10/29/2021      Assessment & Plan:  Labs: Routine blood work ordered today will also check thyroid markers.    Cetirizine 10 mg bid, Famotidine 20 mg bid, Hydroxyzine 25 mg, Mometasone 0.01% cream, and Methylprednisolone 4 mg prescribed.   Problem List Items Addressed This Visit     Hashimoto's thyroiditis    Is following with ophthalmology and endocrinology and is not well controlled. They tried [place] for thyroid eye disease but it is on hold now      Hypertension    Stop Amlodipine and continue Losartan recheck pulse and bp in 2-4 weeks.       Relevant Medications   losartan (COZAAR) 100 MG tablet   Other Relevant Orders   Comp Met (CMET)   CBC w/Diff   TSH   Hyperthyroidism   Relevant Orders   TSH   Thyroid peroxidase antibody   T4, free   Ambulatory referral to Dermatology   Rash - Primary    Patient has had a rash for over 6 months, he has seen numerous practitioners including dermatology and no clear diagnosis has been established. Dermatology did not biopsy or make any substantive changes. He is referred to a new dermatology office and adjustments are made. He is started on Cetirizine 10 mg po bid and Famotidine 10-20 mg po bid, given Mometasone cream to use prn and given a 5 day medrol dosepak. Finally his Amlodipine is stopped and he will continue Losartan 100 mg po daily. Spent 30 minutes discussing current state and  history and then developing a plan of care. Consideration of Erythema Multiforme is given even the rash is not classic      Relevant Orders   Ambulatory referral to Dermatology   Meds ordered this encounter  Medications   cetirizine (ZYRTEC) 10 MG tablet    Sig: Take 1 tablet (10 mg total) by mouth 2 (two) times daily.    Dispense:  60 tablet    Refill:  3  famotidine (PEPCID) 20 MG tablet    Sig: Take 1 tablet (20 mg total) by mouth 2 (two) times daily.    Dispense:  60 tablet    Refill:  3   methylPREDNISolone (MEDROL) 4 MG tablet    Sig: Take 1 tablet (4 mg total) by mouth daily.    Dispense:  15 tablet    Refill:  0   hydrOXYzine (VISTARIL) 25 MG capsule    Sig: Take 1 capsule (25 mg total) by mouth at bedtime as needed.    Dispense:  30 capsule    Refill:  0   mometasone (ELOCON) 0.1 % cream    Sig: Apply topically daily as needed.    Dispense:  45 g    Refill:  1   DISCONTD: amLODipine (NORVASC) 5 MG tablet    Sig: Take 1 tablet (5 mg total) by mouth daily.    Dispense:  30 tablet    Refill:  2   losartan (COZAAR) 100 MG tablet    Sig: TAKE 1 TABLET(100 MG) BY MOUTH DAILY    Dispense:  90 tablet    Refill:  0   I, Penni Homans, MD, personally preformed the services described in this documentation.  All medical record entries made by the scribe were at my direction and in my presence.  I have reviewed the chart and discharge instructions (if applicable) and agree that the record reflects my personal performance and is accurate and complete. 04/30/2022  I,Mohammed Iqbal,acting as a scribe for Penni Homans, MD.,have documented all relevant documentation on the behalf of Penni Homans, MD,as directed by  Penni Homans, MD while in the presence of Penni Homans, MD.  Penni Homans, MD

## 2022-05-01 ENCOUNTER — Telehealth: Payer: Self-pay

## 2022-05-01 ENCOUNTER — Telehealth: Payer: Self-pay | Admitting: Medical

## 2022-05-01 DIAGNOSIS — R21 Rash and other nonspecific skin eruption: Secondary | ICD-10-CM | POA: Insufficient documentation

## 2022-05-01 LAB — THYROID PEROXIDASE ANTIBODY: Thyroperoxidase Ab SerPl-aCnc: 105 IU/mL — ABNORMAL HIGH (ref <9)

## 2022-05-01 LAB — CBC WITH DIFFERENTIAL/PLATELET
Basophils Absolute: 0 10*3/uL (ref 0.0–0.1)
Basophils Relative: 0.6 % (ref 0.0–3.0)
Eosinophils Absolute: 0.1 10*3/uL (ref 0.0–0.7)
Eosinophils Relative: 1.5 % (ref 0.0–5.0)
HCT: 48.4 % (ref 39.0–52.0)
Hemoglobin: 16.6 g/dL (ref 13.0–17.0)
Lymphocytes Relative: 26.2 % (ref 12.0–46.0)
Lymphs Abs: 1.8 10*3/uL (ref 0.7–4.0)
MCHC: 34.3 g/dL (ref 30.0–36.0)
MCV: 103.7 fl — ABNORMAL HIGH (ref 78.0–100.0)
Monocytes Absolute: 0.6 10*3/uL (ref 0.1–1.0)
Monocytes Relative: 8.8 % (ref 3.0–12.0)
Neutro Abs: 4.3 10*3/uL (ref 1.4–7.7)
Neutrophils Relative %: 62.9 % (ref 43.0–77.0)
Platelets: 122 10*3/uL — ABNORMAL LOW (ref 150.0–400.0)
RBC: 4.67 Mil/uL (ref 4.22–5.81)
RDW: 13 % (ref 11.5–15.5)
WBC: 6.8 10*3/uL (ref 4.0–10.5)

## 2022-05-01 LAB — COMPREHENSIVE METABOLIC PANEL
ALT: 24 U/L (ref 0–53)
AST: 19 U/L (ref 0–37)
Albumin: 4.2 g/dL (ref 3.5–5.2)
Alkaline Phosphatase: 40 U/L (ref 39–117)
BUN: 18 mg/dL (ref 6–23)
CO2: 28 mEq/L (ref 19–32)
Calcium: 9.3 mg/dL (ref 8.4–10.5)
Chloride: 101 mEq/L (ref 96–112)
Creatinine, Ser: 1.25 mg/dL (ref 0.40–1.50)
GFR: 57.02 mL/min — ABNORMAL LOW (ref >60.00)
Glucose, Bld: 143 mg/dL — ABNORMAL HIGH (ref 70–99)
Potassium: 4.5 mEq/L (ref 3.5–5.1)
Sodium: 137 mEq/L (ref 135–145)
Total Bilirubin: 0.9 mg/dL (ref 0.2–1.2)
Total Protein: 6.5 g/dL (ref 6.0–8.3)

## 2022-05-01 MED ORDER — LOSARTAN POTASSIUM 100 MG PO TABS: ORAL_TABLET | ORAL | 0 refills | Status: DC

## 2022-05-01 NOTE — Assessment & Plan Note (Signed)
Stop Amlodipine and continue Losartan recheck pulse and bp in 2-4 weeks.

## 2022-05-01 NOTE — Assessment & Plan Note (Addendum)
Patient has had a rash for over 6 months, he has seen numerous practitioners including dermatology and no clear diagnosis has been established. Dermatology did not biopsy or make any substantive changes. He is referred to a new dermatology office and adjustments are made. He is started on Cetirizine 10 mg po bid and Famotidine 10-20 mg po bid, given Mometasone cream to use prn and given a 5 day medrol dosepak. Finally his Amlodipine is stopped and he will continue Losartan 100 mg po daily. Spent 30 minutes discussing current state and history and then developing a plan of care. Consideration of Erythema Multiforme is given even the rash is not classic

## 2022-05-01 NOTE — Telephone Encounter (Signed)
Called pt was advised  Made appt will pcp to discuss medication.

## 2022-05-01 NOTE — Telephone Encounter (Signed)
Pt said that he was reviewing the side effects of Januvia and noticed that it causes redness/rashes which he is currently struggling with. He would like to know if he can stop taking that medication for a week to see if that helps with the rash that he currently has. Please call to advise if this okay

## 2022-05-01 NOTE — Telephone Encounter (Signed)
Called pt was advised of the recommendation per Dr.Blyth, Pt was advised to continue Losartan 100 mg to stopping Amlodipine (instead of increasing it). Pt stated understand Pt has appt for 2-4 weeks for a nurse bp and pulse check.

## 2022-05-01 NOTE — Assessment & Plan Note (Signed)
Is following with ophthalmology and endocrinology and is not well controlled. They tried Tepezza for thyroid eye disease but it is on hold now

## 2022-05-02 LAB — TSH: TSH: 1.4 u[IU]/mL (ref 0.35–5.50)

## 2022-05-02 LAB — T4, FREE: Free T4: 0.69 ng/dL (ref 0.60–1.60)

## 2022-05-07 ENCOUNTER — Telehealth: Payer: Self-pay | Admitting: Medical

## 2022-05-07 ENCOUNTER — Other Ambulatory Visit: Payer: Medicare HMO

## 2022-05-07 ENCOUNTER — Telehealth (INDEPENDENT_AMBULATORY_CARE_PROVIDER_SITE_OTHER): Payer: Medicare HMO | Admitting: Medical

## 2022-05-07 DIAGNOSIS — E119 Type 2 diabetes mellitus without complications: Secondary | ICD-10-CM

## 2022-05-07 DIAGNOSIS — L853 Xerosis cutis: Secondary | ICD-10-CM | POA: Diagnosis not present

## 2022-05-07 DIAGNOSIS — R21 Rash and other nonspecific skin eruption: Secondary | ICD-10-CM | POA: Diagnosis not present

## 2022-05-07 DIAGNOSIS — E039 Hypothyroidism, unspecified: Secondary | ICD-10-CM | POA: Diagnosis not present

## 2022-05-07 MED ORDER — AMMONIUM LACTATE 12 % EX LOTN: 1.0000 | TOPICAL_LOTION | CUTANEOUS | 0 refills | Status: DC | PRN

## 2022-05-07 NOTE — Progress Notes (Signed)
Subjective:    Patient ID: Michael Whitney, male    DOB: 1950-08-30, 72 y.o.   MRN: [id number]  HPI  Virtual Visit via Video Note  I connected with [name] on 05/07/22 at  9:00 AM EDT by a video enabled telemedicine application and verified that I am speaking with the correct person using two identifiers.  Location: Patient: home Provider: office   I discussed the limitations of evaluation and management by telemedicine and the availability of in person appointments. The patient expressed understanding and agreed to proceed.  History of Present Illness:  Pt in for follow up. Last visit with Dr. Charlett Blake. " Patient has had a rash for over 6 months, he has seen numerous practitioners including dermatology and no clear diagnosis has been established. Dermatology did not biopsy or make any substantive changes. He is referred to a new dermatology office and adjustments are made. He is started on Cetirizine 10 mg po bid and Famotidine 10-20 mg po bid, given Mometasone cream to use prn and given a 5 day medrol dosepak. Finally his Amlodipine is stopped and he will continue Losartan 100 mg po daily. Spent 30 minutes discussing current state and history and then developing a plan of care. Consideration of Erythema Multiforme is given even the rash is not classic    "  Pt tells me rash is now on his face.    Pt has hashimotos- he has endocrinolgoist and is on treatment. He is trying to get second opinion. He has not followed up with endocrinologist in some time. His last rx of 50 mcg dose was not filled. He states only on 25 mcg daily. He is trying to contact [facility].  On review of meds given the other day low dose medrol 4 mg given and instructed to take daily on the bottle. He has 11 tablets left.     Observations/Objective: General-no acute distress, pleasant, oriented. Lungs- on inspection lungs appear unlabored. Neck- no tracheal deviation or jvd on inspection. Neuro-  gross motor function appears intact.  Derm- on exam only slight pinkish appearance to face and arms. Skin looks dry and flaky. No blisters on skin. No mucosal lesions. No shortness of breath. No wheezing.     Assessment and Plan: Patient Instructions  1. Hypothyroidism, unspecified type Continue current levothyroxine. You are trying to establish care for second opinion.  - Ambulatory referral to Endocrinology. I tried to call new office but could not get receptionist on phone. Let me know what date they give you. If delay then would recommend you follow up with current endocrinologist.  2. Rash etology undetermined(possible allergy but cause not identified?). On video appears  Dry skin  Continue Cetirizine 10 mg po bid, Famotidine 10-20 mg po bid, given Mometasone cream to use prn and medrol 4 mg daily. Lachydrin lotion sent to phamacy. Place referral to new dermatologist. Ask you call office today for appointment. We will be sending over referral as well.  - Ambulatory referral to Dermatology     4. Type 2 diabetes mellitus without complication, without long-term current use of insulin (HCC) On januvia 25 mg daiy.  Will follow A1c today. Consider possible higher taper dose of prednisone than was given but would proceed with caution and get A1c first as steroid can increase sugar level. - Hemoglobin A1c    Follow up in 10 days or sooner if needed.  Mackie Pai, PA-C    Time spent with patient today was 50  minutes which consisted of chart revdew, discussing diagnoses, work up, treatment , placing referrals and documentation.   Follow Up Instructions:    I discussed the assessment and treatment plan with the patient. The patient was provided an opportunity to ask questions and all were answered. The patient agreed with the plan and demonstrated an understanding of the instructions.   The patient was advised to call back or seek an in-person evaluation if the symptoms worsen or  if the condition fails to improve as anticipated.     Mackie Pai, PA-C   Review of Systems  Constitutional:  Negative for chills, diaphoresis and fatigue.  Respiratory:  Negative for cough, chest tightness, shortness of breath and wheezing.        Objective:   Physical Exam        Assessment & Plan:

## 2022-05-07 NOTE — Telephone Encounter (Signed)
Pt unable to come to lab appt today due to transportation and short notice. Rescheduled to Tuesday 3/19 to give himself to find time to get a ride. Please advise pt if this is an issue.

## 2022-05-07 NOTE — Patient Instructions (Addendum)
1. Hypothyroidism, unspecified type Continue current levothyroxine. You are trying to establish care for second opinion.  - Ambulatory referral to Endocrinology. I tried to call new office but could not get receptionist on phone. Let me know what date they give you. If delay then would recommend you follow up with current endocrinologist.  2. Rash etology undetermined(possible allergy but cause not identified?). On video appears  Dry skin  Continue Cetirizine 10 mg po bid, Famotidine 10-20 mg po bid, given Mometasone cream to use prn and medrol 4 mg daily. Lachydrin lotion sent to phamacy. Place referral to new dermatologist. Ask you call office today for appointment. We will be sending over referral as well.  - Ambulatory referral to Dermatology     4. Type 2 diabetes mellitus without complication, without long-term current use of insulin (HCC) On januvia 25 mg daiy.  Will follow A1c today. Consider possible higher taper dose of prednisone than was given but would proceed with caution and get A1c first as steroid can increase sugar level. - Hemoglobin A1c   Follow up in 10 days or sooner if needed.

## 2022-05-07 NOTE — Telephone Encounter (Signed)
Lab reschedule is ok

## 2022-05-08 ENCOUNTER — Other Ambulatory Visit: Payer: Self-pay | Admitting: Medical

## 2022-05-08 ENCOUNTER — Other Ambulatory Visit: Payer: Self-pay | Admitting: Internal Medicine

## 2022-05-12 ENCOUNTER — Other Ambulatory Visit: Payer: Medicare HMO

## 2022-05-14 ENCOUNTER — Encounter (HOSPITAL_BASED_OUTPATIENT_CLINIC_OR_DEPARTMENT_OTHER): Payer: Self-pay | Admitting: Pediatrics

## 2022-05-14 ENCOUNTER — Ambulatory Visit: Payer: Medicare HMO | Admitting: General Practice

## 2022-05-14 ENCOUNTER — Other Ambulatory Visit: Payer: Self-pay

## 2022-05-14 ENCOUNTER — Emergency Department (HOSPITAL_BASED_OUTPATIENT_CLINIC_OR_DEPARTMENT_OTHER)
Admission: EM | Admit: 2022-05-14 | Discharge: 2022-05-14 | Disposition: A | Discharge status: Home / Self Care | Payer: Medicare HMO | Attending: Emergency Medicine | Admitting: Emergency Medicine

## 2022-05-14 ENCOUNTER — Ambulatory Visit (INDEPENDENT_AMBULATORY_CARE_PROVIDER_SITE_OTHER): Payer: Medicare HMO | Admitting: Medical

## 2022-05-14 VITALS — BP 130/82 | HR 62 | Temp 98.0°F | Resp 18 | Ht 75.0 in | Wt 278.0 lb

## 2022-05-14 DIAGNOSIS — L853 Xerosis cutis: Secondary | ICD-10-CM | POA: Diagnosis not present

## 2022-05-14 DIAGNOSIS — R42 Dizziness and giddiness: Secondary | ICD-10-CM | POA: Insufficient documentation

## 2022-05-14 DIAGNOSIS — E063 Autoimmune thyroiditis: Secondary | ICD-10-CM

## 2022-05-14 DIAGNOSIS — E038 Other specified hypothyroidism: Secondary | ICD-10-CM

## 2022-05-14 DIAGNOSIS — R531 Weakness: Secondary | ICD-10-CM | POA: Insufficient documentation

## 2022-05-14 DIAGNOSIS — E119 Type 2 diabetes mellitus without complications: Secondary | ICD-10-CM | POA: Diagnosis not present

## 2022-05-14 DIAGNOSIS — R21 Rash and other nonspecific skin eruption: Secondary | ICD-10-CM | POA: Diagnosis not present

## 2022-05-14 DIAGNOSIS — Z7901 Long term (current) use of anticoagulants: Secondary | ICD-10-CM | POA: Diagnosis not present

## 2022-05-14 DIAGNOSIS — E039 Hypothyroidism, unspecified: Secondary | ICD-10-CM | POA: Insufficient documentation

## 2022-05-14 DIAGNOSIS — I4891 Unspecified atrial fibrillation: Secondary | ICD-10-CM | POA: Diagnosis not present

## 2022-05-14 LAB — BASIC METABOLIC PANEL
Anion gap: 10 (ref 5–15)
BUN: 33 mg/dL — ABNORMAL HIGH (ref 8–23)
CO2: 24 mmol/L (ref 22–32)
Calcium: 9.2 mg/dL (ref 8.9–10.3)
Chloride: 101 mmol/L (ref 98–111)
Creatinine, Ser: 1.42 mg/dL — ABNORMAL HIGH (ref 0.61–1.24)
GFR, Estimated: 52 mL/min — ABNORMAL LOW (ref >60)
Glucose, Bld: 209 mg/dL — ABNORMAL HIGH (ref 70–99)
Potassium: 5.2 mmol/L — ABNORMAL HIGH (ref 3.5–5.1)
Sodium: 135 mmol/L (ref 135–145)

## 2022-05-14 LAB — URINALYSIS, MICROSCOPIC (REFLEX)

## 2022-05-14 LAB — URINALYSIS, ROUTINE W REFLEX MICROSCOPIC
Bilirubin Urine: NEGATIVE
Glucose, UA: NEGATIVE mg/dL
Ketones, ur: NEGATIVE mg/dL
Leukocytes,Ua: NEGATIVE
Nitrite: NEGATIVE
Protein, ur: NEGATIVE mg/dL
Specific Gravity, Urine: 1.02 (ref 1.005–1.030)
pH: 5.5 (ref 5.0–8.0)

## 2022-05-14 LAB — CBC
HCT: 47.5 % (ref 39.0–52.0)
Hemoglobin: 16.5 g/dL (ref 13.0–17.0)
MCH: 34.9 pg — ABNORMAL HIGH (ref 26.0–34.0)
MCHC: 34.7 g/dL (ref 30.0–36.0)
MCV: 100.4 fL — ABNORMAL HIGH (ref 80.0–100.0)
Platelets: 138 10*3/uL — ABNORMAL LOW (ref 150–400)
RBC: 4.73 MIL/uL (ref 4.22–5.81)
RDW: 12 % (ref 11.5–15.5)
WBC: 8.1 10*3/uL (ref 4.0–10.5)
nRBC: 0 % (ref 0.0–0.2)

## 2022-05-14 LAB — MAGNESIUM: Magnesium: 2.3 mg/dL (ref 1.7–2.4)

## 2022-05-14 LAB — GLUCOSE, POCT (MANUAL RESULT ENTRY): POC Glucose: 198 mg/dl — AB (ref 70–99)

## 2022-05-14 LAB — CBG MONITORING, ED: Glucose-Capillary: 209 mg/dL — ABNORMAL HIGH (ref 70–99)

## 2022-05-14 MED ORDER — SODIUM CHLORIDE 0.9 % IV BOLUS
500.0000 mL | Freq: Once | INTRAVENOUS | Status: AC
  Administered 2022-05-14: 500 mL via INTRAVENOUS

## 2022-05-14 NOTE — Assessment & Plan Note (Signed)
Today dizziness severe enough today that felt like was about to pass out. Still feels weak and unstable that required wheel chair. Under circumstances today feel that it would be unsafe to do out labs out patient. I think we need stat lab and probable ct imaging head.

## 2022-05-14 NOTE — Assessment & Plan Note (Signed)
Cetirizine 10 mg po bid, Famotidine 10-20 mg po bid.  I had rx'd Lachydrin lotion sent to phamacy. Placed referral to new dermatologist.    - Ambulatory referral to Dermatology

## 2022-05-14 NOTE — ED Notes (Signed)
Discharge paperwork reviewed entirely with patient, including Rx's and follow up care. Pain was under control. Pt verbalized understanding as well as all parties involved. No questions or concerns voiced at the time of discharge. No acute distress noted.   Pt ambulated out to PVA without incident or assistance.  

## 2022-05-14 NOTE — ED Notes (Signed)
Discharge paperwork reviewed entirely with patient, including Rx's and follow up care. Pain was under control. Pt verbalized understanding as well as all parties involved. No questions or concerns voiced at the time of discharge. No acute distress noted.   Pt was wheeled out to the PVA in a wheelchair without incident.  

## 2022-05-14 NOTE — ED Triage Notes (Signed)
Reported was sent by PCP for further eval d/t progressive weakness. Endorsed hx of Hashimoto's and low dose of levothyroxine does nothing for him. Denies pain when asked.

## 2022-05-14 NOTE — Progress Notes (Signed)
Subjective:    Patient ID: Michael Whitney, male    DOB: [birth year], 72 y.o.   MRN: [id number]  HPI  Pt in today for follow up.  Pt states today after shower felt more dizzy this morning than usual. Pt has daily dizziness for years. Extensive work up by various specialist in past. No exact etiology found.  Describes  generalized weakness and felt like he was about to pass out. Pt states he still feels like may pass out but not as bad as before.   He asked for wheel chair today as he states he feels unstable.  No gross or motor deficits reported.   Pt states no bp or pulse at time of event earlier today. Pt did not check sugar today    Pt in past has thought his dizziness was associated with his thyroid. He has seen endocrinologist and is trying to get opinion [phone]  Flaky dry skin persists. (No blister or lesions inside mouth or on skin)Not much improvement with lac-hydrin. Other treatment Cetirizine 10 mg po bid, Famotidine 10-20 mg po bid, given Mometasone cream to use prn and medrol 4 mg daily did not resolve. Former derm did biopsy was negative per pt report,  Diabetes- on januvia. Pt never got A1c the other day.  Sugar level was 198.     Review of Systems  Constitutional:  Negative for chills and diaphoresis.  Respiratory:  Negative for cough.   Cardiovascular:  Negative for chest pain and palpitations.  Gastrointestinal:  Negative for abdominal pain.  Genitourinary:  Negative for dysuria, flank pain and frequency.  Musculoskeletal:  Negative for back pain, joint swelling and neck pain.  Skin:        Dry appearing skin.  Neurological:  Positive for dizziness, weakness and light-headedness.       Near syncope today and still feels like may fall.     Past Medical History:  Diagnosis Date   Atrial fibrillation ([place])    Cataract    Diabetes mellitus without complication ([place])    Hypertension    Seizures ([place])    Thought to be related to something his GF  gave him     Social History   Socioeconomic History   Marital status: Widowed    Spouse name: Not on file   Number of children: 2   Years of education: Not on file   Highest education level: Not on file  Occupational History   Occupation: retired    Comment: Geophysicist/field seismologist  Tobacco Use   Smoking status: Never   Smokeless tobacco: Never  Vaping Use   Vaping Use: Never used  Substance and Sexual Activity   Alcohol use: Yes    Alcohol/week: 2.0 standard drinks of alcohol    Types: 1 Shots of liquor, 1 Cans of beer per week    Comment: occasionally   Drug use: No   Sexual activity: Not on file  Other Topics Concern   Not on file  Social History Narrative   Not on file   Social Determinants of Health   Financial Resource Strain: Low Risk  (08/18/2021)   Overall Financial Resource Strain (CARDIA)    Difficulty of Paying Living Expenses: Not hard at all  Food Insecurity: No Food Insecurity (08/18/2021)   Hunger Vital Sign    Worried About Running Out of Food in the Last Year: Never true    Ran Out of Food in the Last Year: Never true  Transportation Needs: No Transportation Needs (  08/18/2021)   PRAPARE - Hydrologist (Medical): No    Lack of Transportation (Non-Medical): No  Physical Activity: Sufficiently Active (08/18/2021)   Exercise Vital Sign    Days of Exercise per Week: 4 days    Minutes of Exercise per Session: 60 min  Stress: No Stress Concern Present (08/18/2021)   [place]    Feeling of Stress : Not at all  Social Connections: Socially Isolated (08/18/2021)   Social Connection and Isolation Panel [NHANES]    Frequency of Communication with Friends and Family: More than three times a week    Frequency of Social Gatherings with Friends and Family: Twice a week    Attends Religious Services: Never    Marine scientist or Organizations: No    Attends Theatre manager Meetings: Never    Marital Status: Widowed  Intimate Partner Violence: Not At Risk (08/18/2021)   Humiliation, Afraid, Rape, and Kick questionnaire    Fear of Current or Ex-Partner: No    Emotionally Abused: No    Physically Abused: No    Sexually Abused: No    Past Surgical History:  Procedure Laterality Date   CATARACT EXTRACTION     PVC ABLATION N/A 07/14/2017   Procedure: PVC ABLATION;  Surgeon: Evans Lance, MD;  Location: [facility];  Service: Cardiovascular;  Laterality: N/A;   RIGHT/LEFT HEART CATH AND CORONARY ANGIOGRAPHY N/A 10/24/2020   Procedure: RIGHT/LEFT HEART CATH AND CORONARY ANGIOGRAPHY;  Surgeon: [name], Peter M, MD;  Location: [facility];  Service: Cardiovascular;  Laterality: N/A;   TONSILLECTOMY     VARICOSE VEIN SURGERY      Family History  Problem Relation Age of Onset   CAD Mother        MI at age 38    No Known Allergies  Current Outpatient Medications on File Prior to Visit  Medication Sig Dispense Refill   Accu-Chek FastClix Lancets MISC Use 1 lancet daily to test blood sugar (E11.9)     ammonium lactate (AMLACTIN) 12 % cream Apply 1 application. topically as needed for dry skin. 385 g 0   ammonium lactate (LAC-HYDRIN) 12 % lotion Apply 1 Application topically as needed for dry skin. 400 g 0   azelastine (ASTELIN) 0.1 % nasal spray Place 2 sprays into both nostrils 2 (two) times daily. Use in each nostril as directed 30 mL 0   cetirizine (ZYRTEC) 10 MG tablet Take 1 tablet (10 mg total) by mouth 2 (two) times daily. 60 tablet 3   Cholecalciferol (VITAMIN D-3) 25 MCG (1000 UT) CAPS Take 1,000 Units by mouth daily.     famotidine (PEPCID) 20 MG tablet Take 1 tablet (20 mg total) by mouth 2 (two) times daily. 60 tablet 3   hydrOXYzine (VISTARIL) 25 MG capsule Take 1 capsule (25 mg total) by mouth at bedtime as needed. 30 capsule 0   JANUVIA 25 MG tablet TAKE 1 TABLET(25 MG) BY MOUTH DAILY 30 tablet 3   levothyroxine  (SYNTHROID) 50 MCG tablet Take 1 tablet (50 mcg total) by mouth daily. 90 tablet 3   losartan (COZAAR) 100 MG tablet TAKE 1 TABLET(100 MG) BY MOUTH DAILY 90 tablet 0   methylPREDNISolone (MEDROL) 4 MG tablet Take 1 tablet (4 mg total) by mouth daily. 15 tablet 0   mometasone (ELOCON) 0.1 % cream Apply topically daily as needed. 45 g 1   Multiple Vitamin (  MULTI-VITAMIN) tablet Take 1 tablet by mouth daily.     Omega-3 Fatty Acids (FISH OIL) 1000 MG CAPS Take 1,000 mg by mouth daily.     OVER THE COUNTER MEDICATION Take 6 drops by mouth daily. Vitamins A, D, and K     Teprotumumab-trbw (TEPEZZA IV) Inject into the vein. Every 3 weeks.     vitamin B-12 (CYANOCOBALAMIN) 1000 MCG tablet Take 1,000 mcg by mouth daily.     Vitamin D, Ergocalciferol, (DRISDOL) 1.25 MG (50000 UNIT) CAPS capsule Take 1 capsule (50,000 Units total) by mouth every 7 (seven) days. 5 capsule 12   XARELTO 20 MG TABS tablet TAKE 1 TABLET(20 MG) BY MOUTH DAILY WITH SUPPER 90 tablet 0   No current facility-administered medications on file prior to visit.    BP 130/82   Pulse 62   Temp 98 F (36.7 C)   Resp 18   Ht 6\' 3"  (1.905 m)   Wt 278 lb (126.1 kg)   SpO2 99%   BMI 34.75 kg/m        Objective:   Physical Exam   General Mental Status- Alert. General Appearance- Not in acute distress.   Skin General: Color- Normal Color. Moisture- Normal Moisture.  Neck Carotid Arteries- Normal color. Moisture- Normal Moisture. No carotid bruits. No JVD.  Chest and Lung Exam Auscultation: Breath Sounds:-Normal.  Cardiovascular Auscultation:Rythm- Regular. Murmurs & Other Heart Sounds:Auscultation of the heart reveals- No Murmurs.  Abdomen Inspection:-Inspeection Normal. Palpation/Percussion:Note:No mass. Palpation and Percussion of the abdomen reveal- Non Tender, Non Distended + BS, no rebound or guarding.   Neurologic Cranial Nerve exam:- CN III-XII intact(No nystagmus), symmetric smile. Strength:- 5/5 equal  and symmetric strength both upper and lower extremities.  But describes if he stands that so unsteady may fall.    Assessment & Plan:   Patient Instructions  Refer down stairs for further work up/evaluation due signficant worse dizziness with near syncope compared to your baseline. Called down stairs and updated Emergency Department MD on recent presentation. Sugar 198 today in office. EKG done.   Hypothyroidism due to Hashimoto's thyroiditis Assessment & Plan:  levothyroxine. You are trying to establish care for second opinion.  - Ambulatory referral to Endocrinology. New appointment pending. Let me know what date they give you.   Type 2 diabetes mellitus without complication, without long-term current use of insulin (HCC) -     POCT glucose (manual entry)  Dry skin Assessment & Plan: Continue lachydrin pending derm referral.   Dizziness Assessment & Plan: Today dizziness severe enough today that felt like was about to pass out. Still feels weak and unstable that required wheel chair. Under circumstances today feel that it would be unsafe to do out labs out patient. I think we need stat lab and probable ct imaging head.  Orders: -     POCT glucose (manual entry) -     EKG 12-Lead  Rash Assessment & Plan: Cetirizine 10 mg po bid, Famotidine 10-20 mg po bid.  I had rx'd Lachydrin lotion sent to phamacy. Placed referral to new dermatologist.    - Ambulatory referral to Dermatology     Will ask ED if they can add A1c to labs.  Follow up date to be determined after ED evaluation review.     Mackie Pai, PA-C   Time spent with patient today was  50 minutes which consisted of chart review, discussing diagnosis, explaining need to send down to ED  and documentation.

## 2022-05-14 NOTE — ED Notes (Signed)
Report rec'd from prev RN 

## 2022-05-14 NOTE — Assessment & Plan Note (Signed)
Continue lachydrin pending derm referral.

## 2022-05-14 NOTE — ED Provider Notes (Signed)
[facility] Provider Note   CSN: [id number] Arrival date & time: 05/14/22  1234     History  Chief Complaint  Patient presents with   Weakness    Michael Whitney is a 72 y.o. male.  HPI   72 year old male with past medical history of atrial fibrillation on anticoagulation, chronic dizziness presents to the emergency department after an episode of dizziness.  Patient was at his primary doctor's office when he had felt dizzy.  Report from staff is that they had to sit him down because he fell like he was going to pass out.  Patient states that this is very characteristic of his dizzy episodes.  He is had an extensive workup in regards to them.  He believes that his symptoms are secondary to his Hashimoto's.  He follows outpatient with endocrinology.  There is been no other acute change in his health, no recent fever or illness, no medication changes.  He is compliant with his anticoagulation.  Denies any chest pain, shortness of breath.  Dizziness has resolved and there is no associated neurosymptoms.  Home Medications Prior to Admission medications   Medication Sig Start Date End Date Taking? Authorizing Provider  Accu-Chek FastClix Lancets MISC Use 1 lancet daily to test blood sugar (E11.9) 08/12/17   [provider]  ammonium lactate (AMLACTIN) 12 % cream Apply 1 application. topically as needed for dry skin. 05/19/21   Saguier, Percell Miller, PA-C  ammonium lactate (LAC-HYDRIN) 12 % lotion Apply 1 Application topically as needed for dry skin. 05/07/22   Saguier, Percell Miller, PA-C  azelastine (ASTELIN) 0.1 % nasal spray Place 2 sprays into both nostrils 2 (two) times daily. Use in each nostril as directed 11/25/21   Saguier, Percell Miller, PA-C  cetirizine (ZYRTEC) 10 MG tablet Take 1 tablet (10 mg total) by mouth 2 (two) times daily. 04/30/22   Mosie Lukes, MD  Cholecalciferol (VITAMIN D-3) 25 MCG (1000 UT) CAPS Take 1,000 Units by mouth daily.     [provider]  famotidine (PEPCID) 20 MG tablet Take 1 tablet (20 mg total) by mouth 2 (two) times daily. 04/30/22   Mosie Lukes, MD  hydrOXYzine (VISTARIL) 25 MG capsule Take 1 capsule (25 mg total) by mouth at bedtime as needed. 04/30/22   Mosie Lukes, MD  JANUVIA 25 MG tablet TAKE 1 TABLET(25 MG) BY MOUTH DAILY 03/24/22   Saguier, Percell Miller, PA-C  levothyroxine (SYNTHROID) 50 MCG tablet Take 1 tablet (50 mcg total) by mouth daily. 05/21/21   Shamleffer, Melanie Crazier, MD  losartan (COZAAR) 100 MG tablet TAKE 1 TABLET(100 MG) BY MOUTH DAILY 05/01/22   Mosie Lukes, MD  methylPREDNISolone (MEDROL) 4 MG tablet Take 1 tablet (4 mg total) by mouth daily. 04/30/22   Mosie Lukes, MD  mometasone (ELOCON) 0.1 % cream Apply topically daily as needed. 04/30/22   Mosie Lukes, MD  Multiple Vitamin (MULTI-VITAMIN) tablet Take 1 tablet by mouth daily.    [provider]  Omega-3 Fatty Acids (FISH OIL) 1000 MG CAPS Take 1,000 mg by mouth daily.    [provider]  OVER THE COUNTER MEDICATION Take 6 drops by mouth daily. Vitamins A, D, and K    [provider]  Teprotumumab-trbw (TEPEZZA IV) Inject into the vein. Every 3 weeks.    [provider]  vitamin B-12 (CYANOCOBALAMIN) 1000 MCG tablet Take 1,000 mcg by mouth daily.    [provider]  Vitamin D,  Ergocalciferol, (DRISDOL) 1.25 MG (50000 UNIT) CAPS capsule Take 1 capsule (50,000 Units total) by mouth every 7 (seven) days. 10/31/21   Carollee Herter, Yvonne R, DO  XARELTO 20 MG TABS tablet TAKE 1 TABLET(20 MG) BY MOUTH DAILY WITH SUPPER 05/08/22   Saguier, Percell Miller, PA-C      Allergies    Patient has no known allergies.    Review of Systems   Review of Systems  Constitutional:  Negative for fever.  Respiratory:  Negative for shortness of breath.   Cardiovascular:  Negative for chest pain.  Gastrointestinal:  Negative for abdominal pain, diarrhea and vomiting.  Skin:  Negative for rash.   Neurological:  Positive for dizziness. Negative for syncope, facial asymmetry, speech difficulty, weakness and headaches.    Physical Exam Updated Vital Signs BP (!) 141/69   Pulse (!) 55   Temp 97.7 F (36.5 C) (Oral)   Resp 14   Ht 6\' 3"  (1.905 m)   Wt 126.1 kg   SpO2 99%   BMI 34.75 kg/m  Physical Exam Vitals and nursing note reviewed.  Constitutional:      General: He is not in acute distress.    Appearance: Normal appearance.  HENT:     Head: Normocephalic.     Mouth/Throat:     Mouth: Mucous membranes are moist.  Cardiovascular:     Rate and Rhythm: Normal rate.  Pulmonary:     Effort: Pulmonary effort is normal. No respiratory distress.  Abdominal:     Palpations: Abdomen is soft.     Tenderness: There is no abdominal tenderness.  Skin:    General: Skin is warm.  Neurological:     General: No focal deficit present.     Mental Status: He is alert and oriented to person, place, and time. Mental status is at baseline.  Psychiatric:        Mood and Affect: Mood normal.     ED Results / Procedures / Treatments   Labs (all labs ordered are listed, but only abnormal results are displayed) Labs Reviewed  BASIC METABOLIC PANEL - Abnormal; Notable for the following components:      Result Value   Potassium 5.2 (*)    Glucose, Bld 209 (*)    BUN 33 (*)    Creatinine, Ser 1.42 (*)    GFR, Estimated 52 (*)    All other components within normal limits  CBC - Abnormal; Notable for the following components:   MCV 100.4 (*)    MCH 34.9 (*)    Platelets 138 (*)    All other components within normal limits  URINALYSIS, ROUTINE W REFLEX MICROSCOPIC - Abnormal; Notable for the following components:   Hgb urine dipstick TRACE (*)    All other components within normal limits  URINALYSIS, MICROSCOPIC (REFLEX) - Abnormal; Notable for the following components:   Bacteria, UA RARE (*)    All other components within normal limits  CBG MONITORING, ED - Abnormal; Notable  for the following components:   Glucose-Capillary 209 (*)    All other components within normal limits  MAGNESIUM    EKG EKG Interpretation  Date/Time:  Thursday May 14 2022 12:54:09 EDT Ventricular Rate:  69 PR Interval:    QRS Duration: 162 QT Interval:  433 QTC Calculation: 429 R Axis:   -55 Text Interpretation: Atrial fibrillation Ventricular premature complex Right bundle branch block Inferior infarct, old similar to previous Confirmed by Lavenia Atlas [phone]) on 05/14/2022 2:02:00 PM  Radiology No results  found.  Procedures Procedures    Medications Ordered in ED Medications  sodium chloride 0.9 % bolus 500 mL (500 mLs Intravenous New Bag/Given 05/14/22 1436)    ED Course/ Medical Decision Making/ A&P                             Medical Decision Making Amount and/or Complexity of Data Reviewed Labs: ordered.   72 year old male presents emergency department after another episode of dizziness, this has been a chronic issue for the patient.  Vital signs are stable here.  EKG shows atrial fibrillation which is baseline for the patient.  Blood work shows mild AKI, potassium is 5.2 but no other acute findings.  Recommended small amount of IV hydration.  Patient tolerated well and feels well and back to baseline.  He has no other acute complaints.  Given the chronic nature of these repetitive episodes with no ongoing symptoms do not feel the patient requires further emergent workup in regards to the dizziness.  Patient is comfortable with this, he is requesting discharge.  Does not appear to be a cardiac aspect of this.  Will plan for outpatient follow-up.  Patient at this time appears safe and stable for discharge and close outpatient follow up. Discharge plan and strict return to ED precautions discussed, patient verbalizes understanding and agreement.         Final Clinical Impression(s) / ED Diagnoses Final diagnoses:  Lightheadedness    Rx / DC  Orders ED Discharge Orders     None         Lorelle Gibbs, DO 05/14/22 1534

## 2022-05-14 NOTE — Patient Instructions (Addendum)
Refer down stairs for further work up/evaluation due signficant worse dizziness with near syncope compared to your baseline. Called down stairs and updated Emergency Department MD on recent presentation. Sugar 198 today in office. EKG done I office. Looked like nsr but when tried to print freeze screen extreme uninterpretable artifact.on further review of EKG one lead did not see p waves. Possible atriabl fibriallation. Hx of that on review of epic. Notified ED MD.  Hypothyroidism due to Hashimoto's thyroiditis Assessment & Plan:  levothyroxine. You are trying to establish care for second opinion.  - Ambulatory referral to Endocrinology. New appointment pending. Let me know what date they give you.   Type 2 diabetes mellitus without complication, without long-term current use of insulin (HCC) -     POCT glucose (manual entry)  Dry skin Assessment & Plan: Continue lachydrin pending derm referral.   Dizziness Assessment & Plan: Today dizziness severe enough today that felt like was about to pass out. Still feels weak and unstable that required wheel chair. Under circumstances today feel that it would be unsafe to do out labs out patient. I think we need stat lab and probable ct imaging head.  Orders: -     POCT glucose (manual entry) -     EKG 12-Lead  Rash Assessment & Plan: Cetirizine 10 mg po bid, Famotidine 10-20 mg po bid.  I had rx'd Lachydrin lotion sent to phamacy. Placed referral to new dermatologist.    - Ambulatory referral to Dermatology     Will ask ED if they can add A1c to labs.  Follow up date to be determined after ED evaluation review.

## 2022-05-14 NOTE — Discharge Instructions (Addendum)
You have been seen and discharged from the emergency department.  Your blood work showed mild dehydration, but was otherwise baseline for you.  Follow-up with your primary provider for further evaluation and further care. Take home medications as prescribed. If you have any worsening symptoms or further concerns for your health please return to an emergency department for further evaluation.

## 2022-05-14 NOTE — Assessment & Plan Note (Signed)
levothyroxine. You are trying to establish care for second opinion.  - Ambulatory referral to Endocrinology. New appointment pending. Let me know what date they give you.

## 2022-05-15 ENCOUNTER — Other Ambulatory Visit: Payer: Self-pay | Admitting: Internal Medicine

## 2022-05-15 ENCOUNTER — Telehealth: Payer: Self-pay | Admitting: Medical

## 2022-05-15 NOTE — Telephone Encounter (Signed)
Pt called stating that [name] \\had  said at his appt yesterday that he would be prescribing levothyroxine for him but he had called the pharmacy with no Rx available. After reviewing chart, advised that no Rx was sent in and a note would be sent back to look into this. Pt would like a call back  when more info is available.

## 2022-05-15 NOTE — Telephone Encounter (Signed)
correct 

## 2022-05-18 ENCOUNTER — Encounter: Payer: Self-pay | Admitting: Medical

## 2022-05-19 ENCOUNTER — Ambulatory Visit: Payer: Medicare HMO

## 2022-05-19 NOTE — Progress Notes (Deleted)
Pt here for Blood pressure check per Dr Charlett Blake: " Return for 2-3 week nurse blood pressure and pulse check." Stop Amlodipine and continue Losartan recheck pulse and bp in 2-4 weeks. - per note with Dr Charlett Blake.    Pt currently takes:  Losartan 100 mg daily.   Pt reports compliance with medication.  BP today @ = HR =  Pt advised per Dr Charlett Blake:

## 2022-05-27 ENCOUNTER — Other Ambulatory Visit: Payer: Self-pay | Admitting: Family Medicine

## 2022-05-29 NOTE — Addendum Note (Signed)
Addended by: Gwenevere Abbot on: 05/29/2022 01:25 PM   Modules accepted: Orders

## 2022-06-02 DIAGNOSIS — I872 Venous insufficiency (chronic) (peripheral): Secondary | ICD-10-CM | POA: Diagnosis not present

## 2022-06-02 DIAGNOSIS — L853 Xerosis cutis: Secondary | ICD-10-CM | POA: Diagnosis not present

## 2022-06-02 DIAGNOSIS — L821 Other seborrheic keratosis: Secondary | ICD-10-CM | POA: Diagnosis not present

## 2022-07-01 ENCOUNTER — Other Ambulatory Visit: Payer: Self-pay | Admitting: Family Medicine

## 2022-07-02 ENCOUNTER — Other Ambulatory Visit: Payer: Self-pay | Admitting: Medical

## 2022-07-08 ENCOUNTER — Telehealth: Payer: Self-pay | Admitting: Medical

## 2022-07-08 NOTE — Telephone Encounter (Signed)
Patient would like a call to discuss his current medications. He is unsure if he still needs to be taking 3 types of blood pressure medications. Please advise.

## 2022-07-08 NOTE — Telephone Encounter (Signed)
Do you want to address this or have him talk with cardiology ?

## 2022-07-09 NOTE — Telephone Encounter (Signed)
Pt.notified

## 2022-07-26 ENCOUNTER — Other Ambulatory Visit: Payer: Self-pay | Admitting: Family Medicine

## 2022-08-05 NOTE — Telephone Encounter (Signed)
Pt called and stated he has a "rash again" pt states it is the same rash that has come up before onset a few months. Pt states the rash is all over both arms and pt woke up 08/02/2022 to his face covered in the rash as well. Pt was advised to come in for an appointment, pt states he would like to see what PCP says first.

## 2022-08-06 NOTE — Telephone Encounter (Signed)
Spoke with pt , stated he would try to call [facility] to get an appt since that's where he was referred to last time for rash and if they can't get him in he will call us back and come in Friday

## 2022-08-07 ENCOUNTER — Telehealth: Payer: Self-pay | Admitting: Medical

## 2022-08-07 ENCOUNTER — Telehealth: Payer: Self-pay | Admitting: Cardiology

## 2022-08-07 ENCOUNTER — Other Ambulatory Visit: Payer: Self-pay | Admitting: Medical

## 2022-08-07 NOTE — Telephone Encounter (Signed)
Pt said he was referred to a dermatologist in [place] but it's too far to go. Pt would like to be referred to an office closer to him. He is okay with dermatologist across the street. Please advise pt of new referral

## 2022-08-07 NOTE — Addendum Note (Signed)
Addended by: Gwenevere Abbot on: 08/07/2022 05:24 PM   Modules accepted: Orders

## 2022-08-07 NOTE — Telephone Encounter (Signed)
Pt c/o medication issue:  1. Name of Medication: he wants to know what blood pressure  medicine is he supposed to be on  2. How are you currently taking this medication (dosage and times per day)?   3. Are you having a reaction (difficulty breathing--STAT)?   4. What is your medication issue?  Finding out the correct medicine he should be taking

## 2022-08-07 NOTE — Telephone Encounter (Signed)
FYI Patient was calling to verify his blood pressure medications. The only current blood pressure medication in his list is losartan. He stated he thought he was taking more than losartan. I did advise his PCP is prescribing all of his medications. He stated they told him to contact cardiology. I saw on his last visit in February with Irving Burton he was taking amlodipine 2.5mg . His PCP office informed him to stop taking in March.   I offered him an appointment to discuss his blood pressure medications so you can take over for him and he stated we are too far to get to he would like to leave as his right now.  I contacted his PCP and spoke with his PCP CMA and informed her that they do prescribe all of his medications. Can some one give him a call to clarify what he is supposed to be taking being that he would like for them to continuing managing them.

## 2022-08-11 ENCOUNTER — Other Ambulatory Visit: Payer: Self-pay | Admitting: Medical

## 2022-08-12 DIAGNOSIS — L2089 Other atopic dermatitis: Secondary | ICD-10-CM | POA: Diagnosis not present

## 2022-08-21 ENCOUNTER — Ambulatory Visit (INDEPENDENT_AMBULATORY_CARE_PROVIDER_SITE_OTHER): Payer: Medicare HMO | Admitting: *Deleted

## 2022-08-21 DIAGNOSIS — Z0000 Encounter for general adult medical examination without abnormal findings: Secondary | ICD-10-CM

## 2022-08-21 NOTE — Progress Notes (Signed)
Subjective:   Michael Whitney is a 72 y.o. male who presents for Medicare Annual/Subsequent preventive examination.  Visit Complete: Virtual  I connected with  Michael Whitney on 08/21/22 by a audio enabled telemedicine application and verified that I am speaking with the correct person using two identifiers.  Patient Location: Home  Provider Location: Office/Clinic  I discussed the limitations of evaluation and management by telemedicine. The patient expressed understanding and agreed to proceed.   Review of Systems     Cardiac Risk Factors include: advanced age (>4men, >32 women);male gender;diabetes mellitus;dyslipidemia;hypertension;obesity (BMI >30kg/m2)     Objective:    Today's Vitals   There is no height or weight on file to calculate BMI.     08/21/2022    9:08 AM 05/14/2022   12:51 PM 08/18/2021    1:36 PM 02/06/2021    1:14 PM 12/11/2020   10:00 PM 12/11/2020    3:06 PM 10/24/2020    6:18 AM  Advanced Directives  Does Patient Have a Medical Advance Directive? Yes No Yes Yes No No Yes  Type of Estate agent of [place];Living will  Healthcare Power of [place];Living will Healthcare Power of [place];Living will;Out of facility DNR (pink MOST or yellow form)   Healthcare Power of [place];Living will  Does patient want to make changes to medical advance directive?       No - Patient declined  Copy of Healthcare Power of Attorney in Chart? No - copy requested  No - copy requested    No - copy requested  Would patient like information on creating a medical advance directive?  No - Patient declined   No - Patient declined No - Patient declined     Current Medications (verified) Outpatient Encounter Medications as of 08/21/2022  Medication Sig   Accu-Chek FastClix Lancets MISC Use 1 lancet daily to test blood sugar (E11.9)   ammonium lactate (AMLACTIN) 12 % cream Apply 1 application. topically as needed for dry skin.   ammonium lactate (LAC-HYDRIN)  12 % lotion Apply 1 Application topically as needed for dry skin.   azelastine (ASTELIN) 0.1 % nasal spray Place 2 sprays into both nostrils 2 (two) times daily. Use in each nostril as directed   cetirizine (ZYRTEC) 10 MG tablet Take 1 tablet (10 mg total) by mouth 2 (two) times daily.   Cholecalciferol (VITAMIN D-3) 25 MCG (1000 UT) CAPS Take 1,000 Units by mouth daily.   famotidine (PEPCID) 20 MG tablet Take 1 tablet (20 mg total) by mouth 2 (two) times daily.   hydrOXYzine (VISTARIL) 25 MG capsule TAKE 1 CAPSULE(25 MG) BY MOUTH AT BEDTIME AS NEEDED   JANUVIA 25 MG tablet TAKE 1 TABLET(25 MG) BY MOUTH DAILY   levothyroxine (SYNTHROID) 50 MCG tablet TAKE 1 TABLET(50 MCG) BY MOUTH DAILY   losartan (COZAAR) 100 MG tablet TAKE 1 TABLET(100 MG) BY MOUTH DAILY   methylPREDNISolone (MEDROL) 4 MG tablet Take 1 tablet (4 mg total) by mouth daily.   mometasone (ELOCON) 0.1 % cream Apply topically daily as needed.   Multiple Vitamin (MULTI-VITAMIN) tablet Take 1 tablet by mouth daily.   Omega-3 Fatty Acids (FISH OIL) 1000 MG CAPS Take 1,000 mg by mouth daily.   OVER THE COUNTER MEDICATION Take 6 drops by mouth daily. Vitamins A, D, and K   Teprotumumab-trbw (TEPEZZA IV) Inject into the vein. Every 3 weeks.   vitamin B-12 (CYANOCOBALAMIN) 1000 MCG tablet Take 1,000 mcg by mouth daily.   Vitamin D, Ergocalciferol, (  DRISDOL) 1.25 MG (50000 UNIT) CAPS capsule Take 1 capsule (50,000 Units total) by mouth every 7 (seven) days.   XARELTO 20 MG TABS tablet TAKE 1 TABLET(20 MG) BY MOUTH DAILY WITH SUPPER   No facility-administered encounter medications on file as of 08/21/2022.    Allergies (verified) Patient has no known allergies.   History: Past Medical History:  Diagnosis Date   Atrial fibrillation (HCC)    Cataract    Diabetes mellitus without complication (HCC)    Hypertension    Seizures (HCC)    Thought to be related to something his GF gave him   Past Surgical History:  Procedure Laterality  Date   CATARACT EXTRACTION     PVC ABLATION N/A 07/14/2017   Procedure: PVC ABLATION;  Surgeon: Marinus Maw, MD;  Location: [facility] INVASIVE CV LAB;  Service: Cardiovascular;  Laterality: N/A;   RIGHT/LEFT HEART CATH AND CORONARY ANGIOGRAPHY N/A 10/24/2020   Procedure: RIGHT/LEFT HEART CATH AND CORONARY ANGIOGRAPHY;  Surgeon: Swaziland, Peter M, MD;  Location: [facility] INVASIVE CV LAB;  Service: Cardiovascular;  Laterality: N/A;   TONSILLECTOMY     VARICOSE VEIN SURGERY     Family History  Problem Relation Age of Onset   CAD Mother        MI at age 79   Social History   Socioeconomic History   Marital status: Widowed    Spouse name: Not on file   Number of children: 2   Years of education: Not on file   Highest education level: Not on file  Occupational History   Occupation: retired    Comment: Environmental manager  Tobacco Use   Smoking status: Never   Smokeless tobacco: Never  Vaping Use   Vaping Use: Never used  Substance and Sexual Activity   Alcohol use: Yes    Alcohol/week: 2.0 standard drinks of alcohol    Types: 1 Shots of liquor, 1 Cans of beer per week    Comment: occasionally   Drug use: No   Sexual activity: Not on file  Other Topics Concern   Not on file  Social History Narrative   Not on file   Social Determinants of Health   Financial Resource Strain: Low Risk  (08/21/2022)   Overall Financial Resource Strain (CARDIA)    Difficulty of Paying Living Expenses: Not hard at all  Food Insecurity: No Food Insecurity (08/21/2022)   Hunger Vital Sign    Worried About Running Out of Food in the Last Year: Never true    Ran Out of Food in the Last Year: Never true  Transportation Needs: No Transportation Needs (08/21/2022)   PRAPARE - Administrator, Civil Service (Medical): No    Lack of Transportation (Non-Medical): No  Physical Activity: Sufficiently Active (08/21/2022)   Exercise Vital Sign    Days of Exercise per Week: 5 days    Minutes of Exercise per Session:  70 min  Stress: Stress Concern Present (08/21/2022)   [employer] of Occupational Health - Occupational Stress Questionnaire    Feeling of Stress : To some extent  Social Connections: Socially Isolated (08/21/2022)   Social Connection and Isolation Panel [NHANES]    Frequency of Communication with Friends and Family: More than three times a week    Frequency of Social Gatherings with Friends and Family: Once a week    Attends Religious Services: Never    Database administrator or Organizations: No    Attends Banker Meetings: Never  Marital Status: Widowed    Tobacco Counseling Counseling given: Not Answered   Clinical Intake:  Pre-visit preparation completed: Yes  Pain : No/denies pain  Nutritional Risks: None Diabetes: Yes CBG done?: No Did pt. bring in CBG monitor from home?: No  How often do you need to have someone help you when you read instructions, pamphlets, or other written materials from your doctor or pharmacy?: 1 - Never  Interpreter Needed?: No  Information entered by :: Donne Anon, CMA   Activities of Daily Living    08/21/2022    8:58 AM  In your present state of health, do you have any difficulty performing the following activities:  Hearing? 0  Vision? 1  Comment double vision  Difficulty concentrating or making decisions? 0  Walking or climbing stairs? 0  Dressing or bathing? 0  Doing errands, shopping? 1  Comment due to vision issues  Preparing Food and eating ? N  Using the Toilet? N  In the past six months, have you accidently leaked urine? N  Do you have problems with loss of bowel control? N  Managing your Medications? N  Managing your Finances? N  Housekeeping or managing your Housekeeping? N    Patient Care Team: Saguier, Kateri Mc as PCP - General (Internal Medicine) [place]azil[place], Peter M, MD as PCP - Cardiology (Cardiology) Marinus Maw, MD as PCP - Electrophysiology (Cardiology) Drema Dallas, DO as  Consulting Physician (Neurology)  Indicate any recent Medical Services you may have received from other than [facility] providers in the past year (date may be approximate).     Assessment:   This is a routine wellness examination for [place].  Hearing/Vision screen No results found.  Dietary issues and exercise activities discussed:     Goals Addressed   None    Depression Screen    08/21/2022    9:08 AM 04/30/2022    1:44 PM 10/28/2021    6:07 PM 08/18/2021    1:43 PM 11/29/2020    9:03 AM 09/06/2019    1:39 PM  PHQ 2/9 Scores  PHQ - 2 Score 1 0 0 1 0 0    Fall Risk    08/21/2022    9:02 AM 04/30/2022    1:44 PM 10/28/2021    6:07 PM 08/18/2021    1:42 PM 02/06/2021    1:14 PM  Fall Risk   Falls in the past year? 0 0 0 0 0  Number falls in past yr: 0 0 0 0 0  Injury with Fall? 0 0 0 0 0  Risk for fall due to : Impaired balance/gait  No Fall Risks    Follow up Falls evaluation completed Falls evaluation completed Falls evaluation completed Falls prevention discussed     MEDICARE RISK AT HOME:  Medicare Risk at Home - 08/21/22 0901     Any stairs in or around the home? Yes    If so, are there any without handrails? No    Home free of loose throw rugs in walkways, pet beds, electrical cords, etc? Yes    Adequate lighting in your home to reduce risk of falls? Yes    Life alert? No    Use of a cane, walker or w/c? Yes   uses cane sometimes   Grab bars in the bathroom? Yes    Shower chair or bench in shower? No    Elevated toilet seat or a handicapped toilet? No  TIMED UP AND GO:  Was the test performed?  No    Cognitive Function:        08/21/2022    9:09 AM  6CIT Screen  What Year? 0 points  What month? 0 points  What time? 0 points  Count back from 20 4 points  Months in reverse 0 points  Repeat phrase 2 points  Total Score 6 points    Immunizations Immunization History  Administered Date(s) Administered   Fluad Quad(high Dose 65+) 11/29/2020    Influenza Whole 12/23/2018   Influenza, High Dose Seasonal PF 11/08/2017, 10/23/2018   Influenza,inj,Quad PF,6+ Mos 01/23/2015, 11/20/2016   Influenza-Unspecified 01/23/2015, 11/26/2015, 11/26/2015, 11/20/2016, 10/23/2018   PFIZER(Purple Top)SARS-COV-2 Vaccination 04/01/2019, 04/26/2019   Pfizer Covid-19 Vaccine Bivalent Booster 61yrs & up 11/29/2020   Pneumococcal Conjugate-13 02/14/2016, 02/14/2016   Pneumococcal Polysaccharide-23 01/23/2015, 01/23/2015, 08/12/2018    TDAP status: Due, Education has been provided regarding the importance of this vaccine. Advised may receive this vaccine at local pharmacy or Health Dept. Aware to provide a copy of the vaccination record if obtained from local pharmacy or Health Dept. Verbalized acceptance and understanding.  Flu Vaccine status: Up to date  Pneumococcal vaccine status: Up to date  Covid-19 vaccine status: Information provided on how to obtain vaccines.   Qualifies for Shingles Vaccine? Yes   Zostavax completed No   Shingrix Completed?: No.    Education has been provided regarding the importance of this vaccine. Patient has been advised to call insurance company to determine out of pocket expense if they have not yet received this vaccine. Advised may also receive vaccine at local pharmacy or Health Dept. Verbalized acceptance and understanding.  Screening Tests Health Maintenance  Topic Date Due   FOOT EXAM  Never done   OPHTHALMOLOGY EXAM  Never done   Diabetic kidney evaluation - Urine ACR  Never done   DTaP/Tdap/Td (1 - Tdap) Never done   Zoster Vaccines- Shingrix (1 of 2) Never done   Colonoscopy  01/08/2021   HEMOGLOBIN A1C  04/29/2022   COVID-19 Vaccine (4 - 2023-24 season) 09/18/2022 (Originally 10/24/2021)   INFLUENZA VACCINE  09/24/2022   Diabetic kidney evaluation - eGFR measurement  05/14/2023   Medicare Annual Wellness (AWV)  08/21/2023   Pneumonia Vaccine 34+ Years old  Completed   Hepatitis C Screening  Completed    HPV VACCINES  Aged Out    Health Maintenance  Health Maintenance Due  Topic Date Due   FOOT EXAM  Never done   OPHTHALMOLOGY EXAM  Never done   Diabetic kidney evaluation - Urine ACR  Never done   DTaP/Tdap/Td (1 - Tdap) Never done   Zoster Vaccines- Shingrix (1 of 2) Never done   Colonoscopy  01/08/2021   HEMOGLOBIN A1C  04/29/2022    Colorectal cancer screening: Type of screening: Colonoscopy. Completed 05/04/16 per care everywhere but without document . Repeat every 10 years  Lung Cancer Screening: (Low Dose CT Chest recommended if Age 19-80 years, 20 pack-year currently smoking OR have quit w/in 15years.) does not qualify.    Additional Screening:  Hepatitis C Screening: does qualify; Completed 11/05/20  Vision Screening: Recommended annual ophthalmology exams for early detection of glaucoma and other disorders of the eye. Is the patient up to date with their annual eye exam?  Yes  Who is the provider or what is the name of the office in which the patient attends annual eye exams? Doesn't remember name at this time If pt is not  established with a provider, would they like to be referred to a provider to establish care? No .   Dental Screening: Recommended annual dental exams for proper oral hygiene  Diabetic Foot Exam: Diabetic Foot Exam: Overdue, Pt has been advised about the importance in completing this exam. Pt is scheduled for diabetic foot exam on N/a.  Community Resource Referral / Chronic Care Management: CRR required this visit?  No   CCM required this visit?  No     Plan:     I have personally reviewed and noted the following in the patient's chart:   Medical and social history Use of alcohol, tobacco or illicit drugs  Current medications and supplements including opioid prescriptions. Patient is not currently taking opioid prescriptions. Functional ability and status Nutritional status Physical activity Advanced directives List of other  physicians Hospitalizations, surgeries, and ER visits in previous 12 months Vitals Screenings to include cognitive, depression, and falls Referrals and appointments  In addition, I have reviewed and discussed with patient certain preventive protocols, quality metrics, and best practice recommendations. A written personalized care plan for preventive services as well as general preventive health recommendations were provided to patient.     Donne Anon, CMA   08/21/2022   After Visit Summary: (MyChart) Due to this being a telephonic visit, the after visit summary with patients personalized plan was offered to patient via MyChart   Nurse Notes: N/a

## 2022-08-21 NOTE — Patient Instructions (Signed)
Michael Whitney , Thank you for taking time to come for your Medicare Wellness Visit. I appreciate your ongoing commitment to your health goals. Please review the following plan we discussed and let me know if I can assist you in the future.     This is a list of the screening recommended for you and due dates:  Health Maintenance  Topic Date Due   Complete foot exam   Never done   Eye exam for diabetics  Never done   Yearly kidney health urinalysis for diabetes  Never done   DTaP/Tdap/Td vaccine (1 - Tdap) Never done   Zoster (Shingles) Vaccine (1 of 2) Never done   Colon Cancer Screening  01/08/2021   Hemoglobin A1C  04/29/2022   COVID-19 Vaccine (4 - 2023-24 season) 09/18/2022*   Flu Shot  09/24/2022   Yearly kidney function blood test for diabetes  05/14/2023   Medicare Annual Wellness Visit  08/21/2023   Pneumonia Vaccine  Completed   Hepatitis C Screening  Completed   HPV Vaccine  Aged Out  *Topic was postponed. The date shown is not the original due date.     Next appointment: Follow up in one year for your annual wellness visit.   Preventive Care 26 Years and Older, Male Preventive care refers to lifestyle choices and visits with your health care provider that can promote health and wellness. What does preventive care include? A yearly physical exam. This is also called an annual well check. Dental exams once or twice a year. Routine eye exams. Ask your health care provider how often you should have your eyes checked. Personal lifestyle choices, including: Daily care of your teeth and gums. Regular physical activity. Eating a healthy diet. Avoiding tobacco and drug use. Limiting alcohol use. Practicing safe sex. Taking low doses of aspirin every day. Taking vitamin and mineral supplements as recommended by your health care provider. What happens during an annual well check? The services and screenings done by your health care provider during your annual well check will  depend on your age, overall health, lifestyle risk factors, and family history of disease. Counseling  Your health care provider may ask you questions about your: Alcohol use. Tobacco use. Drug use. Emotional well-being. Home and relationship well-being. Sexual activity. Eating habits. History of falls. Memory and ability to understand (cognition). Work and work Astronomer. Screening  You may have the following tests or measurements: Height, weight, and BMI. Blood pressure. Lipid and cholesterol levels. These may be checked every 5 years, or more frequently if you are over 70 years old. Skin check. Lung cancer screening. You may have this screening every year starting at age 80 if you have a 30-pack-year history of smoking and currently smoke or have quit within the past 15 years. Fecal occult blood test (FOBT) of the stool. You may have this test every year starting at age 107. Flexible sigmoidoscopy or colonoscopy. You may have a sigmoidoscopy every 5 years or a colonoscopy every 10 years starting at age 47. Prostate cancer screening. Recommendations will vary depending on your family history and other risks. Hepatitis C blood test. Hepatitis B blood test. Sexually transmitted disease (STD) testing. Diabetes screening. This is done by checking your blood sugar (glucose) after you have not eaten for a while (fasting). You may have this done every 1-3 years. Abdominal aortic aneurysm (AAA) screening. You may need this if you are a current or former smoker. Osteoporosis. You may be screened starting at age 55  if you are at high risk. Talk with your health care provider about your test results, treatment options, and if necessary, the need for more tests. Vaccines  Your health care provider may recommend certain vaccines, such as: Influenza vaccine. This is recommended every year. Tetanus, diphtheria, and acellular pertussis (Tdap, Td) vaccine. You may need a Td booster every 10  years. Zoster vaccine. You may need this after age 69. Pneumococcal 13-valent conjugate (PCV13) vaccine. One dose is recommended after age 20. Pneumococcal polysaccharide (PPSV23) vaccine. One dose is recommended after age 62. Talk to your health care provider about which screenings and vaccines you need and how often you need them. This information is not intended to replace advice given to you by your health care provider. Make sure you discuss any questions you have with your health care provider. Document Released: 03/08/2015 Document Revised: 10/30/2015 Document Reviewed: 12/11/2014 Elsevier Interactive Patient Education  2017 [place] Prevention in the Home Falls can cause injuries. They can happen to people of all ages. There are many things you can do to make your home safe and to help prevent falls. What can I do on the outside of my home? Regularly fix the edges of walkways and driveways and fix any cracks. Remove anything that might make you trip as you walk through a door, such as a raised step or threshold. Trim any bushes or trees on the path to your home. Use bright outdoor lighting. Clear any walking paths of anything that might make someone trip, such as rocks or tools. Regularly check to see if handrails are loose or broken. Make sure that both sides of any steps have handrails. Any raised decks and porches should have guardrails on the edges. Have any leaves, snow, or ice cleared regularly. Use sand or salt on walking paths during winter. Clean up any spills in your garage right away. This includes oil or grease spills. What can I do in the bathroom? Use night lights. Install grab bars by the toilet and in the tub and shower. Do not use towel bars as grab bars. Use non-skid mats or decals in the tub or shower. If you need to sit down in the shower, use a plastic, non-slip stool. Keep the floor dry. Clean up any water that spills on the floor as soon as it  happens. Remove soap buildup in the tub or shower regularly. Attach bath mats securely with double-sided non-slip rug tape. Do not have throw rugs and other things on the floor that can make you trip. What can I do in the bedroom? Use night lights. Make sure that you have a light by your bed that is easy to reach. Do not use any sheets or blankets that are too big for your bed. They should not hang down onto the floor. Have a firm chair that has side arms. You can use this for support while you get dressed. Do not have throw rugs and other things on the floor that can make you trip. What can I do in the kitchen? Clean up any spills right away. Avoid walking on wet floors. Keep items that you use a lot in easy-to-reach places. If you need to reach something above you, use a strong step stool that has a grab bar. Keep electrical cords out of the way. Do not use floor polish or wax that makes floors slippery. If you must use wax, use non-skid floor wax. Do not have throw rugs and other things  on the floor that can make you trip. What can I do with my stairs? Do not leave any items on the stairs. Make sure that there are handrails on both sides of the stairs and use them. Fix handrails that are broken or loose. Make sure that handrails are as long as the stairways. Check any carpeting to make sure that it is firmly attached to the stairs. Fix any carpet that is loose or worn. Avoid having throw rugs at the top or bottom of the stairs. If you do have throw rugs, attach them to the floor with carpet tape. Make sure that you have a light switch at the top of the stairs and the bottom of the stairs. If you do not have them, ask someone to add them for you. What else can I do to help prevent falls? Wear shoes that: Do not have high heels. Have rubber bottoms. Are comfortable and fit you well. Are closed at the toe. Do not wear sandals. If you use a stepladder: Make sure that it is fully opened.  Do not climb a closed stepladder. Make sure that both sides of the stepladder are locked into place. Ask someone to hold it for you, if possible. Clearly mark and make sure that you can see: Any grab bars or handrails. First and last steps. Where the edge of each step is. Use tools that help you move around (mobility aids) if they are needed. These include: Canes. Walkers. Scooters. Crutches. Turn on the lights when you go into a dark area. Replace any light bulbs as soon as they burn out. Set up your furniture so you have a clear path. Avoid moving your furniture around. If any of your floors are uneven, fix them. If there are any pets around you, be aware of where they are. Review your medicines with your doctor. Some medicines can make you feel dizzy. This can increase your chance of falling. Ask your doctor what other things that you can do to help prevent falls. This information is not intended to replace advice given to you by your health care provider. Make sure you discuss any questions you have with your health care provider. Document Released: 12/06/2008 Document Revised: 07/18/2015 Document Reviewed: 03/16/2014 Elsevier Interactive Patient Education  2017 [employer].

## 2022-09-04 ENCOUNTER — Other Ambulatory Visit: Payer: Self-pay | Admitting: Family Medicine

## 2022-09-10 ENCOUNTER — Other Ambulatory Visit: Payer: Medicare HMO | Admitting: Pharmacist

## 2022-09-10 NOTE — Progress Notes (Signed)
Pharmacy Quality Measure Review  This patient is appearing on a report for being at risk of failing the adherence measure for diabetes medications this calendar year.   Medication: Januvia 25mg  daily  Last fill date: 05/28/2022 for 30 day supply (current proportion of days covered for 2024 -   Last A1c was 7.2% (10/2021). Ed Saguier, PAC has order A1c 04/2022 but patient has not had drawn yet.   Called patient to discuss Januvia. He states he has stopped taking Januvia 25mg  because his blood glucose had been good and he was told it likely was not doing much to lower blood glucose. He has not taken in the last 2 months. Denies any issues with Januvia medication costs. Home blood glucose has been 100 to 120. He denied blood glucose every being over 180.  He has been trying to follow low CHO diet with lean proteins and adding vegetables due to having Hashimoto's thyroid disease. He will see Dr Shawnee Knapp -endocrinologist with [facility] regarding his thyroid next week.   He has also been treated with Tezzepa for thyroid eye disease. Today he mentions feeling dizzy and sometime having blurry visit.   Plan:  Offered appointment to see PCP regarding dizziness - patient declined. He thinks it is related to thyroid and will discuss with Dr Shawnee Knapp next week.  Recommended he notify his ophthalmologist about the dizziness / blurry visit due to having thyroid eye disease. Patient states he will call her. Dr Shawnee Knapp will likely check labs and A1c next week. Will check back to see if A1c was checked. If not will have patient come in to our office to check.

## 2022-09-15 DIAGNOSIS — Z1330 Encounter for screening examination for mental health and behavioral disorders, unspecified: Secondary | ICD-10-CM | POA: Diagnosis not present

## 2022-09-15 DIAGNOSIS — E890 Postprocedural hypothyroidism: Secondary | ICD-10-CM | POA: Diagnosis not present

## 2022-09-15 DIAGNOSIS — R5382 Chronic fatigue, unspecified: Secondary | ICD-10-CM | POA: Diagnosis not present

## 2022-09-15 DIAGNOSIS — G4733 Obstructive sleep apnea (adult) (pediatric): Secondary | ICD-10-CM | POA: Diagnosis not present

## 2022-09-15 DIAGNOSIS — E0500 Thyrotoxicosis with diffuse goiter without thyrotoxic crisis or storm: Secondary | ICD-10-CM | POA: Diagnosis not present

## 2022-10-02 ENCOUNTER — Telehealth: Payer: Self-pay | Admitting: Pharmacist

## 2022-10-02 NOTE — Telephone Encounter (Signed)
Patient's last A1c checked 10/2021 Renae Fickle Saguier, PAC has place an order back in March 2024 but has not been drawn yet.  We thought Dr Shawnee Knapp might check A1c when he saw patient for Hashimoto's thyroiditis but he did not check A1c.  Called patient and reminded to call office to make appt for lab.Had to LM on VM with CB#[phone] for appointment.

## 2022-10-04 ENCOUNTER — Other Ambulatory Visit: Payer: Self-pay | Admitting: Medical

## 2022-10-09 ENCOUNTER — Encounter: Payer: Self-pay | Admitting: Pharmacist

## 2022-10-09 ENCOUNTER — Telehealth: Payer: Self-pay | Admitting: Medical

## 2022-10-09 NOTE — Telephone Encounter (Signed)
Pt called & stated he has been having trouble with a swollen throat & that it felt like it was gradually getting worse. Considering it 's Friday, I transferred him to triage to speak with a nurse so pt wouldn't have to wait over the weekend for medical advice.

## 2022-10-09 NOTE — Progress Notes (Signed)
Pharmacy Quality Measure Review  This patient is appearing on a report for being at risk of failing the Glycemic Status Assessment in Diabetes measure this calendar year.    Last documented A1c or GMI 7.2% on 10/29/2021  A1c was ordered whent patient was in to see PCP in March 2024 but was not drawn.  Patient is seeing Endo Dr Shawnee Knapp but for Hashimoto's thyroiditis so A1c was not checked at his appointment .   Left message with patient regarding A1c check  He also missed 2 week appointment with nurse to have blood pressure rechecked.   Will forward message to PCP for recommendations regarding follow up blood pressure / diabetes and A1c.    Henrene Pastor, PharmD Clinical Pharmacist [facility]

## 2022-10-12 NOTE — Telephone Encounter (Signed)
Initial Comment Caller states that his legs are black and blue, happens quite often but its more black and blue, Caller states that he is using compression socks which feels a little better but still hurts, not really swollen. Also has A-fib, was told Valves in his leg causes this. Translation No Nurse Assessment Nurse: Mort Sawyers, RN, Cindy Date/Time (Eastern Time): 10/09/2022 5:02:39 PM Confirm and document reason for call. If symptomatic, describe symptoms. ---Caller has pain in his legs and his legs are bruised Does the patient have any new or worsening symptoms? ---Yes Will a triage be completed? ---Yes Related visit to physician within the last 2 weeks? ---No Does the PT have any chronic conditions? (i.e. diabetes, asthma, this includes High risk factors for pregnancy, etc.) ---Yes List chronic conditions. ---afib , thyroid , hashimoto's Is this a behavioral health or substance abuse call? ---No Guidelines Guideline Title Affirmed Question Affirmed Notes Nurse Date/Time (Eastern Time) Leg Pain Entire foot is cool or blue in comparison to other side Mort Sawyers, RN, Arline Asp 10/09/2022 5:03:45 PM Disp. Time Lamount Cohen Time) Disposition Final User 10/09/2022 5:05:43 PM Go to ED Now Yes Mort Sawyers, RN, Arline Asp PLEASE NOTE: All timestamps contained within this report are represented as [place] Standard Time. CONFIDENTIALTY NOTICE: This fax transmission is intended only for the addressee. It contains information that is legally privileged, confidential or otherwise protected from use or disclosure. If you are not the intended recipient, you are strictly prohibited from reviewing, disclosing, copying using or disseminating any of this information or taking any action in reliance on or regarding this information. If you have received this fax in error, please notify us immediately by telephone so that we can arrange for its return to [place]. Phone: [phone], Toll-Free: [phone], Fax: [phone] Page:  2 of 2 Call Id: [id number] Final Disposition 10/09/2022 5:05:43 PM Go to ED Now Yes Mort Sawyers, RN, Ave Filter Disagree/Comply Comply Caller Understands Yes PreDisposition InappropriateToAsk Care Advice Given Per Guideline GO TO ED NOW: * Leave now. Drive carefully. BRING MEDICINES: * Bring a list of your current medicines when you go to the Emergency Department (ER). * Bring the pill bottles too. This will help the doctor (or NP/PA) to make certain you are taking the right medicines and the right dose. CARE ADVICE given per Leg Pain (Adult) guideline. Comments User: Jodi Marble, RN Date/Time Lamount Cohen Time): 10/09/2022 5:04:10 PM varicose veins Referrals GO TO FACILITY REFUSED

## 2022-10-12 NOTE — Telephone Encounter (Signed)
Pt stated that swelling has went down.  He did keep legs up which helped.  Talked pt into seeing someone tomorrow.  Advised that if it gets worse again to go to ER and do not wait for appt.

## 2022-10-13 ENCOUNTER — Ambulatory Visit (INDEPENDENT_AMBULATORY_CARE_PROVIDER_SITE_OTHER): Payer: Medicare HMO | Admitting: Family

## 2022-10-13 VITALS — BP 130/80 | HR 48 | Temp 98.1°F | Resp 18 | Ht 75.0 in | Wt 287.4 lb

## 2022-10-13 DIAGNOSIS — L819 Disorder of pigmentation, unspecified: Secondary | ICD-10-CM | POA: Diagnosis not present

## 2022-10-13 DIAGNOSIS — R600 Localized edema: Secondary | ICD-10-CM

## 2022-10-13 DIAGNOSIS — E118 Type 2 diabetes mellitus with unspecified complications: Secondary | ICD-10-CM | POA: Diagnosis not present

## 2022-10-13 LAB — CBC WITH DIFFERENTIAL/PLATELET
Basophils Absolute: 0 10*3/uL (ref 0.0–0.1)
Basophils Relative: 0.9 % (ref 0.0–3.0)
Eosinophils Absolute: 0.1 10*3/uL (ref 0.0–0.7)
Eosinophils Relative: 1 % (ref 0.0–5.0)
HCT: 49.7 % (ref 39.0–52.0)
Hemoglobin: 16.4 g/dL (ref 13.0–17.0)
Lymphocytes Relative: 31.5 % (ref 12.0–46.0)
Lymphs Abs: 1.7 10*3/uL (ref 0.7–4.0)
MCHC: 33 g/dL (ref 30.0–36.0)
MCV: 104.8 fl — ABNORMAL HIGH (ref 78.0–100.0)
Monocytes Absolute: 0.5 10*3/uL (ref 0.1–1.0)
Monocytes Relative: 9.2 % (ref 3.0–12.0)
Neutro Abs: 3.1 10*3/uL (ref 1.4–7.7)
Neutrophils Relative %: 57.4 % (ref 43.0–77.0)
Platelets: 144 10*3/uL — ABNORMAL LOW (ref 150.0–400.0)
RBC: 4.74 Mil/uL (ref 4.22–5.81)
RDW: 13.5 % (ref 11.5–15.5)
WBC: 5.3 10*3/uL (ref 4.0–10.5)

## 2022-10-13 LAB — COMPREHENSIVE METABOLIC PANEL
ALT: 29 U/L (ref 0–53)
AST: 19 U/L (ref 0–37)
Albumin: 4.4 g/dL (ref 3.5–5.2)
Alkaline Phosphatase: 42 U/L (ref 39–117)
BUN: 28 mg/dL — ABNORMAL HIGH (ref 6–23)
CO2: 26 mEq/L (ref 19–32)
Calcium: 9 mg/dL (ref 8.4–10.5)
Chloride: 101 mEq/L (ref 96–112)
Creatinine, Ser: 1.31 mg/dL (ref 0.40–1.50)
GFR: 53.73 mL/min — ABNORMAL LOW (ref >60.00)
Glucose, Bld: 242 mg/dL — ABNORMAL HIGH (ref 70–99)
Potassium: 5.5 mEq/L — ABNORMAL HIGH (ref 3.5–5.1)
Sodium: 137 mEq/L (ref 135–145)
Total Bilirubin: 0.7 mg/dL (ref 0.2–1.2)
Total Protein: 6.7 g/dL (ref 6.0–8.3)

## 2022-10-13 LAB — HEMOGLOBIN A1C: Hgb A1c MFr Bld: 9.2 % — ABNORMAL HIGH (ref 4.6–6.5)

## 2022-10-13 LAB — BRAIN NATRIURETIC PEPTIDE: Pro B Natriuretic peptide (BNP): 173 pg/mL — ABNORMAL HIGH (ref 0.0–100.0)

## 2022-10-13 NOTE — Progress Notes (Signed)
Michael Whitney is a 72 y.o. 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   Elevated troponin 12/12/2020   CAD (coronary artery disease) 12/12/2020   Permanent atrial fibrillation (HCC) 12/12/2020   Hyperlipidemia 12/12/2020   NSTEMI (non-ST elevated myocardial infarction) (HCC) 12/11/2020   Chest pain of uncertain etiology 10/24/2020   Chronic diastolic CHF (congestive heart failure) (HCC) 10/24/2020   Labyrinthitis 05/01/2019   Meniere's disease 05/01/2019   PVC (premature ventricular contraction) 07/14/2017   Type 2 diabetes mellitus with complication, without long-term current use of insulin (HCC) 06/29/2017   Persistent atrial fibrillation (HCC) 06/17/2017   Neck pain 12/04/2016   Severe obesity (BMI 35.0-35.9 with comorbidity) (HCC) 09/06/2015   Essential hypertension 02/07/2015   Cataract 02/07/2015   Allergic rhinitis 02/07/2015   COPD (chronic obstructive pulmonary disease) (HCC) 02/07/2015   Hyperthyroidism 02/07/2015   Impaired fasting glucose 02/07/2015   Sensorineural hearing loss (SNHL), bilateral 02/07/2015   Simple chronic bronchitis (HCC) 02/07/2015   Varicose veins of both lower extremities 02/07/2015   Hypertension 02/07/2015   Alcohol abuse 01/22/2015   Cognitive and behavioral changes 01/22/2015   Seizure (HCC) 01/22/2015   Spell of loss of consciousness 11/17/2012   Abnormal EEG 11/05/2012   Syncope 11/05/2012   Thrombocytopenia (HCC) 11/05/2012   Anxiety 11/03/2012   Chronic dizziness 11/03/2012    Current Outpatient Medications  Medication Sig Dispense Refill   Accu-Chek FastClix Lancets MISC Use 1 lancet daily to test blood sugar (E11.9)     ammonium lactate (AMLACTIN) 12 % cream Apply 1 application. topically as needed for dry skin. 385 g 0    ammonium lactate (LAC-HYDRIN) 12 % lotion Apply 1 Application topically as needed for dry skin. 400 g 0   azelastine (ASTELIN) 0.1 % nasal spray Place 2 sprays into both nostrils 2 (two) times daily. Use in each nostril as directed 30 mL 0   cetirizine (ZYRTEC) 10 MG tablet Take 1 tablet (10 mg total) by mouth 2 (two) times daily. 60 tablet 3   Cholecalciferol (VITAMIN D-3) 25 MCG (1000 UT) CAPS Take 1,000 Units by mouth daily.     famotidine (PEPCID) 20 MG tablet TAKE 1 TABLET(20 MG) BY MOUTH TWICE DAILY 60 tablet 3   hydrOXYzine (VISTARIL) 25 MG capsule TAKE 1 CAPSULE(25 MG) BY MOUTH AT BEDTIME AS NEEDED 30 capsule 0   levothyroxine (SYNTHROID) 50 MCG tablet TAKE 1 TABLET(50 MCG) BY MOUTH DAILY 90 tablet 3   losartan (COZAAR) 100 MG tablet TAKE 1 TABLET(100 MG) BY MOUTH DAILY 90 tablet 0   mometasone (ELOCON) 0.1 % cream Apply topically daily as needed. 45 g 1   Multiple Vitamin (MULTI-VITAMIN) tablet Take 1 tablet by mouth daily.     Omega-3 Fatty Acids (FISH OIL) 1000 MG CAPS Take 1,000 mg by mouth daily.     OVER THE COUNTER MEDICATION Take 6 drops by mouth daily. Vitamins A, D, and K     vitamin B-12 (CYANOCOBALAMIN) 1000 MCG tablet Take 1,000 mcg by mouth daily.     Vitamin D, Ergocalciferol, (DRISDOL) 1.25 MG (50000 UNIT) CAPS capsule Take 1 capsule (50,000 Units total) by mouth every 7 (seven) days. 5 capsule 12   XARELTO 20 MG TABS tablet TAKE 1 TABLET(20 MG) BY MOUTH DAILY WITH SUPPER 90 tablet 0  JANUVIA 25 MG tablet TAKE 1 TABLET(25 MG) BY MOUTH DAILY 30 tablet 3   No current facility-administered medications for this visit.    Allergies: Amlodipine  Past Medical History:  Diagnosis Date   Atrial fibrillation (HCC)    Cataract    Diabetes mellitus without complication (HCC)    Hypertension    Seizures (HCC)    Thought to be related to something his GF gave him    Past Surgical History:  Procedure Laterality Date   CATARACT EXTRACTION     PVC ABLATION N/A 07/14/2017    Procedure: PVC ABLATION;  Surgeon: Marinus Maw, MD;  Location: [facility] INVASIVE CV LAB;  Service: Cardiovascular;  Laterality: N/A;   RIGHT/LEFT HEART CATH AND CORONARY ANGIOGRAPHY N/A 10/24/2020   Procedure: RIGHT/LEFT HEART CATH AND CORONARY ANGIOGRAPHY;  Surgeon: Swaziland, Peter M, MD;  Location: [facility] INVASIVE CV LAB;  Service: Cardiovascular;  Laterality: N/A;   TONSILLECTOMY     VARICOSE VEIN SURGERY      Family History  Problem Relation Age of Onset   CAD Mother        MI at age 44    Social History   Tobacco Use   Smoking status: Never   Smokeless tobacco: Never  Substance Use Topics   Alcohol use: Yes    Alcohol/week: 2.0 standard drinks of alcohol    Types: 1 Shots of liquor, 1 Cans of beer per week    Comment: occasionally    Subjective:  Patient called last Friday with concerns about discoloration of bilateral legs; triage nurse did recommend that he go to ER for further evaluation but patient declined since he did not feel it was a new problem. Notes he has been told by his cardiologist that "nothing could be done." He did have some swelling last week which has since resolved with use of compression stockings; Does take Xarelto due to history of A. Fib;   Overdue to have a diabetes follow up- actually notes that he is surprised to hear is diabetic; denies taking the Januvia that is noted on his medication list;    Objective:  Vitals:   10/13/22 1000  BP: 130/80  Pulse: (!) 48  Resp: 18  Temp: 98.1 F (36.7 C)  TempSrc: Temporal  SpO2: 98%  Weight: 287 lb 6.4 oz (130.4 kg)  Height: 6\' 3"  (1.905 m)    General: Well developed, well nourished, in no acute distress  Skin : Warm and dry.  Head: Normocephalic and atraumatic  Lungs: Respirations unlabored;  Extremities: No edema; bilateral discoloration noted; Vessels: Symmetric bilaterally  Neurologic: Alert and oriented; speech intact; face symmetrical; moves all extremities well; CNII-XII intact without focal deficit    Assessment:  1. Type 2 diabetes mellitus with complication, without long-term current use of insulin (HCC)   2. Pedal edema   3. Discoloration of skin of multiple sites of lower extremity     Plan:  Check labs today; return to follow up with his PCP; could consider GLP-1 ( assuming thyroid history is safe) as patient is concerned about persistent weight issues; & 3. Check BNP; discussed vascular referral but patient asked that we get clarification with his cardiologist about this referral; note sent to his cardiologist as requested;   Return in about 2 weeks (around 10/27/2022) for follow up with [name]/ type 2 follow up.  Orders Placed This Encounter  Procedures   CBC with Differential/Platelet   Comp Met (CMET)   Hemoglobin A1c   B Nat  Peptide    Requested Prescriptions    No prescriptions requested or ordered in this encounter

## 2022-10-27 ENCOUNTER — Encounter: Payer: Self-pay | Admitting: Medical

## 2022-10-27 ENCOUNTER — Ambulatory Visit: Payer: Medicare HMO | Admitting: Medical

## 2022-10-27 VITALS — BP 140/70 | HR 100 | Temp 98.0°F | Resp 18 | Ht 75.0 in | Wt 288.0 lb

## 2022-10-27 DIAGNOSIS — Z7984 Long term (current) use of oral hypoglycemic drugs: Secondary | ICD-10-CM | POA: Diagnosis not present

## 2022-10-27 DIAGNOSIS — R42 Dizziness and giddiness: Secondary | ICD-10-CM

## 2022-10-27 DIAGNOSIS — E039 Hypothyroidism, unspecified: Secondary | ICD-10-CM | POA: Diagnosis not present

## 2022-10-27 DIAGNOSIS — I4821 Permanent atrial fibrillation: Secondary | ICD-10-CM | POA: Diagnosis not present

## 2022-10-27 DIAGNOSIS — E118 Type 2 diabetes mellitus with unspecified complications: Secondary | ICD-10-CM | POA: Diagnosis not present

## 2022-10-27 DIAGNOSIS — I10 Essential (primary) hypertension: Secondary | ICD-10-CM

## 2022-10-27 DIAGNOSIS — R600 Localized edema: Secondary | ICD-10-CM | POA: Diagnosis not present

## 2022-10-27 MED ORDER — CANAGLIFLOZIN 100 MG PO TABS: 100.0000 mg | ORAL_TABLET | Freq: Every day | ORAL | 3 refills | Status: DC

## 2022-10-27 MED ORDER — SITAGLIPTIN PHOSPHATE 25 MG PO TABS: 25.0000 mg | ORAL_TABLET | Freq: Every day | ORAL | 3 refills | Status: DC

## 2022-10-27 NOTE — Patient Instructions (Addendum)
1. Hypothyroidism, unspecified type Continue levothyroxine. Check tsh and t4 today.  2. Hypertension, unspecified type Continue losartan 100 mg daily. Decent control today - CBC w/Diff - Comp Met (CMET)  3. Type 2 diabetes mellitus with complication, without long-term current use of insulin (HCC) - rx janvuia and invokan today. Want to get opinion from endocrinoloigst about glp-1 injectable med safety with your thyroid history. - Comp Met (CMET) - Ambulatory referral to diabetic education - Ambulatory referral to Endocrinology  4. Permanent atrial fibrillation (HCC) -continue with xarelto and follow up with cardiologist.  5. Dizziness Stable overall presenty.  Also notes intermittent double vision.  Denies presyncope, syncope.  Prior vascular and neurologic evaluation have been unremarkable. - CBC w/Diff - Comp Met (CMET) -ask you call [facility] neurology again as did refer  Sent to [facility]  [address] [place], [place], [place] [address] Phone: [phone]  6. Pedal edema Bnp mild elevated. Placed cxr to further evaluate. - DG Chest 2 View; Future   If any cardiac or neurologic signs/symptoms occur as discussed be seen in ED.  Follow up in 3 weeks or sooner if needed.

## 2022-10-27 NOTE — Progress Notes (Signed)
Subjective:    Patient ID: Michael Whitney, male    DOB: Jul 17, 1950, 72 y.o.   MRN: [id number]  HPI  Pt in for follow up he continues to express frustration without getting answers regarding double vision which he has had for months/years. Some of MD in th past thought vision was related to   1. 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 Pseudophakia of both eyes Z96.1   No signs of inflammatory stage of Thyroid Orbitopathy; likely muscles are in fibrotic stage   Pt has seen Dr. Quinn Axe. MD.    Pt update me that he did have Tepezza for possible thyroid eye disease. He is going to get opinion from new eye MD.    He is going to see another eye MD next week.   Pt recently saw Ria Clock NP end of August.   A/P Assessment:  1. Type 2 diabetes mellitus with complication, without long-term current use of insulin (HCC)   2. Pedal edema   3. Discoloration of skin of multiple sites of lower extremity     Plan:  Check labs today; return to follow up with his PCP; could consider GLP-1 ( assuming thyroid history is safe) as patient is concerned about persistent weight issues; & 3. Check BNP; discussed vascular referral but patient asked that we get clarification with his cardiologist about this referral; note sent to his cardiologist as requested;    Pt has appointment with cardiologist coming up. Pt has appoinment with Dr. Swaziland next week.   Assessment & Plan    1. Permanent atrial fibrillation/bradycardia: Recent bradycardia.  He notes ongoing generalized weakness, fatigue, intermittent lightheadedness.  Patient thinks his symptoms are likely related to his thyroid/Hashimoto's thyroiditis.  Recent ED evaluation for symptoms.  Previously evaluated by EP who felt that PPM was not indicated.  Discussed with Dr. Swaziland.  Will repeat 14-day ZIO.  Will defer EP evaluation at this time.  Continue Xarelto.   2. Chronic dizziness: Stable  overall.  Also notes intermittent double vision.  Denies presyncope, syncope.  Prior vascular and neurologic evaluation have been unremarkable.  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  Repeat monitor pending as above.   3. Aortic stenosis: Stable, moderate on most recent echo in 11/2021.  Will plan for repeat echo around 11/2022.   4. Nonobstructive CAD: Cath in 2022 showed mild nonobstructive CAD.  He has a chronically elevated troponin.  EKG without ischemic changes.  Denies symptoms concerning for angina.  No ASA in the setting of chronic DOAC therapy.  No beta-blocker in the setting of baseline bradycardia.  He has declined statin therapy.   BNP- 173 last visit. Cxr was not done. Some mild dyspnea on exertion. No shortness of breath lying supine.      A1c was 9.2 end of August. I had written for januvia in the past. Pt doe not remember taking. Pt states metformin caused him to have diarrhea.   Htn- losartan 100 mg daily. Decently controlled today on my check. Better when he checked at home.  Hypothyroid- on levothyroxine 25 mcg daily.   Review of Systems  Constitutional:  Negative for chills, fatigue and fever.  HENT:  Negative for congestion and ear pain.   Eyes:        Chronic double vision for month-years per pt.  Respiratory:  Negative for cough, chest tightness and wheezing.   Cardiovascular:  Negative for chest pain and  palpitations.   Past Medical History:  Diagnosis Date   Atrial fibrillation (HCC)    Cataract    Diabetes mellitus without complication (HCC)    Hypertension    Seizures (HCC)    Thought to be related to something his GF gave him     Social History   Socioeconomic History   Marital status: Widowed    Spouse name: Not on file   Number of children: 2   Years of education: Not on file   Highest education level: Not on file  Occupational History   Occupation: retired    Comment: Environmental manager  Tobacco Use   Smoking status:  Never   Smokeless tobacco: Never  Vaping Use   Vaping status: Never Used  Substance and Sexual Activity   Alcohol use: Yes    Alcohol/week: 2.0 standard drinks of alcohol    Types: 1 Shots of liquor, 1 Cans of beer per week    Comment: occasionally   Drug use: No   Sexual activity: Not on file  Other Topics Concern   Not on file  Social History Narrative   Not on file   Social Determinants of Health   Financial Resource Strain: Low Risk  (08/21/2022)   Overall Financial Resource Strain (CARDIA)    Difficulty of Paying Living Expenses: Not hard at all  Food Insecurity: No Food Insecurity (08/21/2022)   Hunger Vital Sign    Worried About Running Out of Food in the Last Year: Never true    Ran Out of Food in the Last Year: Never true  Transportation Needs: No Transportation Needs (08/21/2022)   PRAPARE - Administrator, Civil Service (Medical): No    Lack of Transportation (Non-Medical): No  Physical Activity: Sufficiently Active (08/21/2022)   Exercise Vital Sign    Days of Exercise per Week: 5 days    Minutes of Exercise per Session: 70 min  Stress: Stress Concern Present (08/21/2022)   [employer] of Occupational Health - Occupational Stress Questionnaire    Feeling of Stress : To some extent  Social Connections: Socially Isolated (08/21/2022)   Social Connection and Isolation Panel [NHANES]    Frequency of Communication with Friends and Family: More than three times a week    Frequency of Social Gatherings with Friends and Family: Once a week    Attends Religious Services: Never    Database administrator or Organizations: No    Attends Banker Meetings: Never    Marital Status: Widowed  Intimate Partner Violence: Unknown (06/21/2022)   [facility]   HITS    Physically Hurt: Not on file    Insult or Talk Down To: Not on file    Threaten Physical Harm: Not on file    Scream or Curse: Not on file    Past Surgical  History:  Procedure Laterality Date   CATARACT EXTRACTION     PVC ABLATION N/A 07/14/2017   Procedure: PVC ABLATION;  Surgeon: Marinus Maw, MD;  Location: [facility] INVASIVE CV LAB;  Service: Cardiovascular;  Laterality: N/A;   RIGHT/LEFT HEART CATH AND CORONARY ANGIOGRAPHY N/A 10/24/2020   Procedure: RIGHT/LEFT HEART CATH AND CORONARY ANGIOGRAPHY;  Surgeon: Swaziland, Peter M, MD;  Location: [facility] INVASIVE CV LAB;  Service: Cardiovascular;  Laterality: N/A;   TONSILLECTOMY     VARICOSE VEIN SURGERY      Family History  Problem Relation Age of Onset   CAD Mother  MI at age 95    Allergies  Allergen Reactions   Amlodipine Other (See Comments)    Dizziness  dizziness    Current Outpatient Medications on File Prior to Visit  Medication Sig Dispense Refill   Accu-Chek FastClix Lancets MISC Use 1 lancet daily to test blood sugar (E11.9)     ammonium lactate (AMLACTIN) 12 % cream Apply 1 application. topically as needed for dry skin. 385 g 0   ammonium lactate (LAC-HYDRIN) 12 % lotion Apply 1 Application topically as needed for dry skin. 400 g 0   azelastine (ASTELIN) 0.1 % nasal spray Place 2 sprays into both nostrils 2 (two) times daily. Use in each nostril as directed 30 mL 0   cetirizine (ZYRTEC) 10 MG tablet Take 1 tablet (10 mg total) by mouth 2 (two) times daily. 60 tablet 3   Cholecalciferol (VITAMIN D-3) 25 MCG (1000 UT) CAPS Take 1,000 Units by mouth daily.     famotidine (PEPCID) 20 MG tablet TAKE 1 TABLET(20 MG) BY MOUTH TWICE DAILY 60 tablet 3   hydrOXYzine (VISTARIL) 25 MG capsule TAKE 1 CAPSULE(25 MG) BY MOUTH AT BEDTIME AS NEEDED 30 capsule 0   JANUVIA 25 MG tablet TAKE 1 TABLET(25 MG) BY MOUTH DAILY 30 tablet 3   levothyroxine (SYNTHROID) 50 MCG tablet TAKE 1 TABLET(50 MCG) BY MOUTH DAILY 90 tablet 3   losartan (COZAAR) 100 MG tablet TAKE 1 TABLET(100 MG) BY MOUTH DAILY 90 tablet 0   mometasone (ELOCON) 0.1 % cream Apply topically daily as needed. 45 g 1   Multiple Vitamin  (MULTI-VITAMIN) tablet Take 1 tablet by mouth daily.     Omega-3 Fatty Acids (FISH OIL) 1000 MG CAPS Take 1,000 mg by mouth daily.     OVER THE COUNTER MEDICATION Take 6 drops by mouth daily. Vitamins A, D, and K     vitamin B-12 (CYANOCOBALAMIN) 1000 MCG tablet Take 1,000 mcg by mouth daily.     Vitamin D, Ergocalciferol, (DRISDOL) 1.25 MG (50000 UNIT) CAPS capsule Take 1 capsule (50,000 Units total) by mouth every 7 (seven) days. 5 capsule 12   XARELTO 20 MG TABS tablet TAKE 1 TABLET(20 MG) BY MOUTH DAILY WITH SUPPER 90 tablet 0   No current facility-administered medications on file prior to visit.    BP (!) 140/70   Pulse 100   Temp 98 F (36.7 C)   Resp 18   Ht 6\' 3"  (1.905 m)   Wt 288 lb (130.6 kg)   SpO2 99%   BMI 36.00 kg/m        Objective:   Physical Exam  General Mental Status- Alert. General Appearance- Not in acute distress.   Skin General: Color- Normal Color. Moisture- Normal Moisture.  Neck Carotid Arteries- Normal color. Moisture- Normal Moisture. No carotid bruits. No JVD.  Chest and Lung Exam Auscultation: Breath Sounds:-Normal.  Cardiovascular Auscultation:Rythm- Regular. Murmurs & Other Heart Sounds:Auscultation of the heart reveals- No Murmurs.  Abdomen Inspection:-Inspeection Normal. Palpation/Percussion:Note:No mass. Palpation and Percussion of the abdomen reveal- Non Tender, Non Distended + BS, no rebound or guarding.   Neurologic Cranial Nerve exam:- CN III-XII intact(No nystagmus), symmetric smile. Strength:- 5/5 equal and symmetric strength both upper and lower extremities.   Lower ext- calfs symmetric. Negative homans signs. Some stasis dermatitis.    Assessment & Plan:   Patient Instructions  1. Hypothyroidism, unspecified type Continue levothyroxine. Check tsh and t4 today.  2. Hypertension, unspecified type Continue losartan 100 mg daily. Decent control today - CBC  w/Diff - Comp Met (CMET)  3. Type 2 diabetes mellitus  with complication, without long-term current use of insulin (HCC) - rx janvuia and invokan today. Want to get opinion from endocrinoloigst about glp-1 injectable med safety with your thyroid history. - Comp Met (CMET) - Ambulatory referral to diabetic education - Ambulatory referral to Endocrinology  4. Permanent atrial fibrillation (HCC) -continue with xarelto and follow up with cardiologist.  5. Dizziness Stable overall presenty.  Also notes intermittent double vision.  Denies presyncope, syncope.  Prior vascular and neurologic evaluation have been unremarkable. - CBC w/Diff - Comp Met (CMET) -ask you call [facility] neurology again as did refer  Sent to [facility]  [address] [place], [place], [place] [address] Phone: [phone]  6. Pedal edema Bnp mild elevated. Placed cxr to further evaluate. - DG Chest 2 View; Future   Follow up in 3 weeks or sooner if needed.   Esperanza Richters, PA-C    Time spent with patient today was  47 minutes which consisted of chart review, discussing diagnosis, work up treatment and documentation.

## 2022-10-28 ENCOUNTER — Telehealth: Payer: Self-pay | Admitting: Medical

## 2022-10-28 LAB — CBC WITH DIFFERENTIAL/PLATELET
Basophils Absolute: 0 10*3/uL (ref 0.0–0.1)
Basophils Relative: 0.6 % (ref 0.0–3.0)
Eosinophils Absolute: 0.1 10*3/uL (ref 0.0–0.7)
Eosinophils Relative: 1.7 % (ref 0.0–5.0)
HCT: 48.7 % (ref 39.0–52.0)
Hemoglobin: 15.9 g/dL (ref 13.0–17.0)
Lymphocytes Relative: 30.8 % (ref 12.0–46.0)
Lymphs Abs: 1.9 10*3/uL (ref 0.7–4.0)
MCHC: 32.6 g/dL (ref 30.0–36.0)
MCV: 105.1 fl — ABNORMAL HIGH (ref 78.0–100.0)
Monocytes Absolute: 0.6 10*3/uL (ref 0.1–1.0)
Monocytes Relative: 9.3 % (ref 3.0–12.0)
Neutro Abs: 3.6 10*3/uL (ref 1.4–7.7)
Neutrophils Relative %: 57.6 % (ref 43.0–77.0)
Platelets: 161 10*3/uL (ref 150.0–400.0)
RBC: 4.63 Mil/uL (ref 4.22–5.81)
RDW: 13.4 % (ref 11.5–15.5)
WBC: 6.3 10*3/uL (ref 4.0–10.5)

## 2022-10-28 LAB — COMPREHENSIVE METABOLIC PANEL
ALT: 27 U/L (ref 0–53)
AST: 22 U/L (ref 0–37)
Albumin: 4.2 g/dL (ref 3.5–5.2)
Alkaline Phosphatase: 40 U/L (ref 39–117)
BUN: 23 mg/dL (ref 6–23)
CO2: 28 meq/L (ref 19–32)
Calcium: 9.3 mg/dL (ref 8.4–10.5)
Chloride: 103 meq/L (ref 96–112)
Creatinine, Ser: 1.19 mg/dL (ref 0.40–1.50)
GFR: 60.28 mL/min (ref >60.00)
Glucose, Bld: 157 mg/dL — ABNORMAL HIGH (ref 70–99)
Potassium: 4.7 meq/L (ref 3.5–5.1)
Sodium: 139 meq/L (ref 135–145)
Total Bilirubin: 0.7 mg/dL (ref 0.2–1.2)
Total Protein: 6.6 g/dL (ref 6.0–8.3)

## 2022-10-28 NOTE — Telephone Encounter (Signed)
Pt called & stated that he was charged $800 for the two medications that were sent in yesterday. He said he was told the medication would be generic at the time of his appt. He would like to know if he can be sent in something else that will not be expensive. He also wanted to know why he was sent in two meds. Please call & advise with pt.

## 2022-10-29 MED ORDER — DAPAGLIFLOZIN PROPANEDIOL 10 MG PO TABS: 10.0000 mg | ORAL_TABLET | Freq: Every day | ORAL | 11 refills | Status: DC

## 2022-10-29 MED ORDER — SAXAGLIPTIN HCL 2.5 MG PO TABS: 2.5000 mg | ORAL_TABLET | Freq: Every day | ORAL | 3 refills | Status: DC

## 2022-10-29 NOTE — Progress Notes (Signed)
Cardiology Office Note   Date:  11/04/2022   ID:  Michael Whitney, DOB 04/28/1950, MRN [id number]  PCP:  Esperanza Richters, PA-C  Cardiologist:   Elyanna Wallick [place], MD   No chief complaint on file.     History of Present Illness: Michael Whitney is a 72 y.o. male who presents for follow up. 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 He is seen for post hospital follow up.    Diagnosed with atrial fibrillation in 2019.  EP study by Dr. Ladona Ridgel revealing PVCs arising near His bundle and ablation was not recommended due to potential for heart block.  He has been maintained on a rate control therapy with anticoagulation.   He has longstanding history of chronic dizziness. He states this started very slowly but has progressed over time and is happening every day.  Has been unrelated to changes in blood pressure or heart rate.  He has completed some element of physical therapy with minimal improvement.   ED visit at [facility] 10/01/2020.  He presented with feeling weak, dyspnea on exertion, chest pain, dizziness, lower extremity edema.  Troponin slightly elevated, BNP slightly elevated. TTE performed with bilateral atria severely dilated, LVEF 55 to 60%, RV moderately to severely dilated, RVSF moderately reduced, mild to moderate MR, moderate TR, mild to moderate pulmonary hypertension.  He was recommended for diuresis.  He was also started on Protonix 40 mg twice daily for 30 days and then decrease to once daily after 1 month at follow-up with PCP.  His hydrochlorothiazide was increased to 25 mg daily.  Imdur 30 mg initiated.  Discharged on 7 day course of Lasix. He was recommended for consideration of right heart cath as an outpatient.   Seen 10/16/20 noting dizziness which was persistent. He was concerned about weight gain and wondered  if thyroid contributory. He endorsed pain as well as dyspnea on exertion was set up for right and left cardiac catheterization.  Underwent right and left cardiac catheterization 10/24/2020 showing nonobstructive coronary artery disease, minimally elevated left heart filling pressures, mild pulmonary hypertension, normal cardiac output.  He was on appropriate diuretic therapy and noted to have some component of diastolic heart failure.      Patient presented to the ED on 12/11/2020 for further evaluation of chronic dizziness which had recently worsened. He also reported blurred/double vision. He denied any loss of vision, headache, or trouble swallowing. He does have some balance issues though. He did reported an episode of substernal chest tightness that lasted for a few hours a few weeks prior to admission but no recurrent chest pain since then.    In the ED, EKG showed atrial fibrillation with no acute ischemic changes. High-sensitivity troponin 251 >> 232 >> 216. Trend felt to be c/w demand ischemia. CBC was completely normal. Na 137, K 5.0, Glucose 111, BUN 23, Cr 1.36. 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 Brain MRI showed no acute findings. He was started on statin therapy. Seen by vascular surgery who felt his vertebral stensosis was not causing his dizziness. Follow up with Neuro and Endocrinology recommended. Echo was unchanged.   He  did have follow up cardiac evaluation last fall. Echo showed EF 50-55%. Mild RV dysfunction. Biatrial enlargement. Severe aortic valve calcification. Moderate to severe AS. Mean gradient only 10 mm Hg. Cardiac cath done showing nonobstructive CAD. Mild LV filling pressures and  mild pulmonary HTN. No significant AV stenosis. He was seen by pulmonary. Tried on spiriva without any change. He is still using CPAP.   He presented to the ED at [facility] on 04/10/2022 with generalized weakness, nausea.  EKG showed slow atrial fibrillation. CK was normal. Troponin was elevated, however, this has been noted to be chronically elevated.  He denied symptoms concerning for angina.  Declined admission.  Chest x-ray was unremarkable.  It was felt that his bradycardia was likely contributing to his symptoms.  Outpatient follow-up with cardiology was recommended. He was seen by Bernadene Person NP and event monitor was ordered. He states he wore it but hasn't sent it in for review. He does note his dizziness was related to thyroid eye disease. He was treated with some infusions and notes this did improve. He does note some easy bruisability on Xarelto. He does have chronic venous stasis but this is stable with compression hose.   Past Medical History:  Diagnosis Date   Atrial fibrillation (HCC)    Cataract    Diabetes mellitus without complication (HCC)    Hypertension    Seizures (HCC)    Thought to be related to something his GF gave him    Past Surgical History:  Procedure Laterality Date   CATARACT EXTRACTION     PVC ABLATION N/A 07/14/2017   Procedure: PVC ABLATION;  Surgeon: Marinus Maw, MD;  Location: [facility] INVASIVE CV LAB;  Service: Cardiovascular;  Laterality: N/A;   RIGHT/LEFT HEART CATH AND CORONARY ANGIOGRAPHY N/A 10/24/2020   Procedure: RIGHT/LEFT HEART CATH AND CORONARY ANGIOGRAPHY;  Surgeon: [place], Mekhai Venuto M, MD;  Location: [facility] INVASIVE CV LAB;  Service: Cardiovascular;  Laterality: N/A;   TONSILLECTOMY     VARICOSE VEIN SURGERY       Current Outpatient Medications  Medication Sig Dispense Refill   Accu-Chek FastClix Lancets MISC Use 1 lancet daily to test blood sugar (E11.9)     ammonium lactate (AMLACTIN) 12 % cream Apply 1 application. topically as needed for  dry skin. 385 g 0   azelastine (ASTELIN) 0.1 % nasal spray Place 2 sprays into both nostrils 2 (two) times daily. Use in each nostril as directed 30 mL 0   canagliflozin (INVOKANA) 300 MG TABS tablet Take 1 tablet (300 mg total) by mouth daily before breakfast. 30 tablet 11   cetirizine (ZYRTEC) 10 MG tablet Take 1 tablet (10 mg total) by mouth 2 (two) times daily. 60 tablet 3   Cholecalciferol (VITAMIN D-3) 25 MCG (1000 UT) CAPS Take 1,000 Units by mouth daily.     famotidine (PEPCID) 20 MG tablet TAKE 1 TABLET(20 MG) BY MOUTH TWICE DAILY 60 tablet 3   JANUVIA 25 MG tablet TAKE 1 TABLET(25 MG) BY MOUTH DAILY 30 tablet 3   levothyroxine (SYNTHROID) 50 MCG tablet TAKE 1 TABLET(50 MCG) BY MOUTH DAILY 90 tablet 3   mometasone (ELOCON) 0.1 % cream Apply topically daily as needed. 45 g 1   Multiple Vitamin (MULTI-VITAMIN) tablet Take 1 tablet by mouth daily.     Omega-3 Fatty Acids (FISH OIL) 1000 MG CAPS Take 1,000 mg by mouth daily.     OVER THE COUNTER MEDICATION Take 6 drops by mouth daily. Vitamins A, D, and  K     vitamin B-12 (CYANOCOBALAMIN) 1000 MCG tablet Take 1,000 mcg by mouth daily.     ammonium lactate (LAC-HYDRIN) 12 % lotion Apply 1 Application topically as needed for dry skin. (Patient not taking: Reported on 11/04/2022) 400 g 0   hydrOXYzine (VISTARIL) 25 MG capsule TAKE 1 CAPSULE(25 MG) BY MOUTH AT BEDTIME AS NEEDED (Patient not taking: Reported on 11/04/2022) 30 capsule 0   losartan (COZAAR) 100 MG tablet TAKE 1 TABLET(100 MG) BY MOUTH DAILY 90 tablet 3   rivaroxaban (XARELTO) 20 MG TABS tablet Take 1 tablet (20 mg total) by mouth daily with supper. 90 tablet 3   saxagliptin HCl (ONGLYZA) 2.5 MG TABS tablet Take 1 tablet (2.5 mg total) by mouth daily. (Patient not taking: Reported on 11/04/2022) 90 tablet 3   Vitamin D, Ergocalciferol, (DRISDOL) 1.25 MG (50000 UNIT) CAPS capsule Take 1 capsule (50,000 Units total) by mouth every 7 (seven) days. (Patient not taking: Reported on 11/04/2022)  5 capsule 12   No current facility-administered medications for this visit.    Allergies:   Amlodipine    Social History:  The patient  reports that he has never smoked. He has never used smokeless tobacco. He reports current alcohol use of about 2.0 standard drinks of alcohol per week. He reports that he does not use drugs.   Family History:  The patient's family history includes CAD in his mother.    ROS:  Please see the history of present illness.   Otherwise, review of systems are positive for none.   All other systems are reviewed and negative.    PHYSICAL EXAM: VS:  BP 118/72   Pulse 76   Ht 6\' 3"  (1.905 m)   Wt 288 lb 12.8 oz (131 kg)   SpO2 96%   BMI 36.10 kg/m  , BMI Body mass index is 36.1 kg/m. GEN: Well nourished, well developed, in no acute distress HEENT: normal Neck: no JVD, carotid bruits, or masses Cardiac: IRRR; gr 2/6 systolic murmur RUSB, no rubs, or gallops,no edema  Respiratory:  clear to auscultation bilaterally, normal work of breathing GI: soft, nontender, nondistended, + BS MS: no deformity or atrophy Skin: warm and dry, no rash Neuro:  Strength and sensation are intact Psych: euthymic mood, full affect   EKG:  EKG is not ordered today. The ekg ordered today demonstrates N/A   Recent Labs: 04/30/2022: TSH 1.40 05/14/2022: Magnesium 2.3 10/13/2022: Pro B Natriuretic peptide (BNP) 173.0 10/27/2022: ALT 27; BUN 23; Creatinine, Ser 1.19; Hemoglobin 15.9; Platelets 161.0; Potassium 4.7; Sodium 139    Lipid Panel    Component Value Date/Time   CHOL 209 (H) 10/29/2021 1415   TRIG 138.0 10/29/2021 1415   HDL 41.10 10/29/2021 1415   CHOLHDL 5 10/29/2021 1415   VLDL 27.6 10/29/2021 1415   LDLCALC 140 (H) 10/29/2021 1415      Wt Readings from Last 3 Encounters:  11/04/22 288 lb 12.8 oz (131 kg)  10/27/22 288 lb (130.6 kg)  10/13/22 287 lb 6.4 oz (130.4 kg)      Other studies Reviewed: Additional studies/ records that were reviewed today  include:   Echo 10/02/20 The left ventricular size is normal. There is severe concentric left  ventricular hypertrophy. The left ventricular wall motion is normal.  Left ventricular systolic function is normal. LV ejection fraction =  55-60%.  Left ventricular filling pattern is pseudonormal. LAP is  indeterminate.  The right ventricle is moderate to severely dilated. The right  ventricular systolic function is moderately reduced.  The atria are severely dilated.  There is mild to moderate mitral regurgitation.  There is moderate tricuspid regurgitation.  Mild to moderate pulmonary hypertension.  Estimated right ventricular systolic pressure is 46 mmHg.  The IVC is dilated with an abnormal collapsibility index, this  suggestive of increased right atrial pressure.   Progression of RV changes and atrial dilation compared to 2020 study.      RIGHT/LEFT HEART CATH AND CORONARY ANGIOGRAPHY    Conclusion       Ost LM to Mid LM lesion is 30% stenosed.   Prox LAD lesion is 30% stenosed.   Mid Cx lesion is 45% stenosed.   Prox RCA to Mid RCA lesion is 30% stenosed.   The left ventricular systolic function is normal.   LV end diastolic pressure is mildly elevated.   The left ventricular ejection fraction is 55-65% by visual estimate.   Hemodynamic findings consistent with mild pulmonary hypertension.   There is no aortic valve stenosis.   There is trivial (1+) mitral regurgitation.   Nonobstructive CAD Minimally elevated left heart filling pressures.  Mild pulmonary HTN. Normal cardiac output.   Plan: patient is on appropriate diuretic therapy (considering he was hydrated prior to cath). Would consider evaluating possible pulmonary cause of his dyspnea. He does have some component of diastolic CHF. May consider PYP scan to evaluate for possible amyloid       Echo 12/12/20:IMPRESSIONS     1. Left ventricular ejection fraction, by estimation, is 50 to 55%. The  left ventricle  has low normal function. The left ventricle has no regional  wall motion abnormalities. There is mild left ventricular hypertrophy.  Left ventricular diastolic function   could not be evaluated.   2. Right ventricular systolic function is mildly reduced. The right  ventricular size is normal.   3. Left atrial size was severely dilated.   4. Right atrial size was moderately dilated.   5. The mitral valve is normal in structure. No evidence of mitral valve  regurgitation. No evidence of mitral stenosis.   6. The aortic valve is tricuspid. There is severe calcifcation of the  aortic valve. Aortic valve regurgitation is trivial. The aortic valve is  moderately to severely calcified with markedly reduced cusp excursion. By  Doppler parameters stensosis is  likely moderate but visually appears more severe.   7. The inferior vena cava is dilated in size with >50% respiratory  variability, suggesting right atrial pressure of 8 mmHg.        ASSESSMENT AND PLAN:  1.  Non-Obstructive CAD Recent cath in 10/2020 showed mild non-obstructive CAD. High-sensitivity troponin 251 >> 232 >> 216. EKG shows no acute ischemic changes. Echo showed LVEF of 50-55%.  No regional wall motion abnormality. No additional ischemic evaluation felt to be needed given recent reassuring cardiac catheterization. Continue high-intensity statin given CAD and vertebral artery stenosis. No Aspirin due to need for DOAC and no beta blocker due to baseline bradycardia.   Chronic Diastolic CHF Echo showed LV function of 50 to 55%.    Mildly reduced RV function. Mildly elevated LV filling pressures at cath. Patient has Lasix 20mg  daily to take PRN.    Permanent Atrial Fibrillation  No AV nodal agents due to baseline bradycardia. On Xarelto. I asked that he go ahead and send in monitor so we can review.    Hypertension  Continue Losartan and amlodipine   Hyperlipidemia Now on high dose statin.  Type 2 Diabetes Mellitus  Per   PCP.   Chronic Dizziness Patient has chronic dizziness. This has been stable. Also reported double vision. Extensive vascular and neurologic evaluation negative. Now appears to be related to thyroid eye disease.    Hypothyroidism Hashimoto Thyroiditis Patient has a history of hypothyroidism/Hashimoto   Aortic stenosis Echocardiogram this admission showed severe calcifcation of the  aortic valve. Aortic valve regurgitation is trivial. The aortic valve is  moderately to severely calcified with markedly reduced cusp excursion. By Doppler parameters stensosis is  likely moderate There was no evidence of aortic stenosis on recent cardiac catheterization. Will monitor for now. Repeat Echo in 6 months       Current medicines are reviewed at length with the patient today.  The patient does not have concerns regarding medicines.  The following changes have been made:  no change  Labs/ tests ordered today include:   No orders of the defined types were placed in this encounter.        Disposition:   FU with me in 6 months  Signed, Alithea Lapage [place], MD  11/04/2022 9:39 AM    [facility] [address], [place], [place], [address] Phone [phone], Fax [phone]

## 2022-10-29 NOTE — Addendum Note (Signed)
Addended by: Gwenevere Abbot on: 10/29/2022 06:54 AM   Modules accepted: Orders

## 2022-10-30 NOTE — Telephone Encounter (Signed)
Pt stated his insurance company will cover  Januvia for $47 Marcelline Deist for $134 Invokana for $45   Pt is okay with paying the $40 cost out of pocket

## 2022-10-30 NOTE — Telephone Encounter (Signed)
Pt called & stated he has info. Please call pt back.

## 2022-10-30 NOTE — Telephone Encounter (Signed)
Pt called to return call. Sent teams msg but informed pt that msg will be sent back to [facility] & she will call back when she's available. Please f/u w/ pt.

## 2022-10-30 NOTE — Telephone Encounter (Signed)
Pt notified , made him aware to call us back to see which medication is covered

## 2022-10-30 NOTE — Telephone Encounter (Signed)
Pt stated the medications at pharmacy were $400 , made him aware he should call his insurance company and ask what medications are covered , stated he will call and give me a call back

## 2022-10-31 MED ORDER — CANAGLIFLOZIN 300 MG PO TABS: 300.0000 mg | ORAL_TABLET | Freq: Every day | ORAL | 11 refills | Status: DC

## 2022-10-31 NOTE — Addendum Note (Signed)
Addended by: Gwenevere Abbot on: 10/31/2022 05:04 PM   Modules accepted: Orders

## 2022-11-02 ENCOUNTER — Ambulatory Visit: Payer: Medicare HMO | Admitting: Cardiology

## 2022-11-02 NOTE — Addendum Note (Signed)
Addended by: Gwenevere Abbot on: 11/02/2022 09:43 PM   Modules accepted: Orders

## 2022-11-02 NOTE — Telephone Encounter (Signed)
Pt states his insurance is saying it is still 400 OOP for just [place] but also said if some of if some of the blood sugar medications are taken off it will lower the price. He is not sure how to proceed and states ins did not tell him which ones would be covered.

## 2022-11-03 ENCOUNTER — Other Ambulatory Visit: Payer: Self-pay | Admitting: Medical

## 2022-11-04 ENCOUNTER — Encounter: Payer: Self-pay | Admitting: Cardiology

## 2022-11-04 ENCOUNTER — Ambulatory Visit: Payer: Medicare HMO | Attending: Cardiology | Admitting: Cardiology

## 2022-11-04 VITALS — BP 118/72 | HR 76 | Ht 75.0 in | Wt 288.8 lb

## 2022-11-04 DIAGNOSIS — R001 Bradycardia, unspecified: Secondary | ICD-10-CM

## 2022-11-04 DIAGNOSIS — I4821 Permanent atrial fibrillation: Secondary | ICD-10-CM

## 2022-11-04 DIAGNOSIS — R42 Dizziness and giddiness: Secondary | ICD-10-CM

## 2022-11-04 DIAGNOSIS — I350 Nonrheumatic aortic (valve) stenosis: Secondary | ICD-10-CM | POA: Diagnosis not present

## 2022-11-04 MED ORDER — LOSARTAN POTASSIUM 100 MG PO TABS: ORAL_TABLET | ORAL | 3 refills | Status: DC

## 2022-11-04 MED ORDER — RIVAROXABAN 20 MG PO TABS: 20.0000 mg | ORAL_TABLET | Freq: Every day | ORAL | 3 refills | Status: DC

## 2022-11-04 NOTE — Patient Instructions (Signed)
Medication Instructions:  Continue same medications *If you need a refill on your cardiac medications before your next appointment, please call your pharmacy*   Lab Work: None ordered   Testing/Procedures: None ordered Mail monitor back in  Follow-Up: At [facility], you and your health needs are our priority.  As part of our continuing mission to provide you with exceptional heart care, we have created designated Provider Care Teams.  These Care Teams include your primary Cardiologist (physician) and Advanced Practice Providers (APPs -  Physician Assistants and Nurse Practitioners) who all work together to provide you with the care you need, when you need it.  We recommend signing up for the patient portal called "MyChart".  Sign up information is provided on this After Visit Summary.  MyChart is used to connect with patients for Virtual Visits (Telemedicine).  Patients are able to view lab/test results, encounter notes, upcoming appointments, etc.  Non-urgent messages can be sent to your provider as well.   To learn more about what you can do with MyChart, go to [email or web address].    Your next appointment:  6 months   Call in Jan to schedule March appointment     Provider:  Dr.Jordan

## 2022-11-09 ENCOUNTER — Ambulatory Visit: Payer: Medicare HMO | Admitting: Pharmacist

## 2022-11-09 DIAGNOSIS — E118 Type 2 diabetes mellitus with unspecified complications: Secondary | ICD-10-CM

## 2022-11-09 DIAGNOSIS — I25118 Atherosclerotic heart disease of native coronary artery with other forms of angina pectoris: Secondary | ICD-10-CM

## 2022-11-09 DIAGNOSIS — I4821 Permanent atrial fibrillation: Secondary | ICD-10-CM

## 2022-11-09 DIAGNOSIS — I5032 Chronic diastolic (congestive) heart failure: Secondary | ICD-10-CM

## 2022-11-09 NOTE — Progress Notes (Signed)
11/09/2022 Name: Michael Whitney MRN: [id number] DOB: 07/01/1950  Chief Complaint  Patient presents with   Hyperlipidemia   Diabetes    Michael Whitney is a 72 y.o. year old male who presented for a telephone visit.   They were referred to the pharmacist by their PCP for assistance in managing diabetes, hyperlipidemia, and medication access.    Subjective:  Care Team: Primary Care Provider: [name] ; Next Scheduled Visit: 11/17/2022  Medication Access/Adherence  Current Pharmacy:  [id number] - [place], [place] - [address] [facility] [address] [place] [address] Phone: [phone] Fax: [phone]   Patient reports affordability concerns with their medications: Yes  - patient recently spent $822 for 90 days of Xarleto and Januvia.  Patient reports access/transportation concerns to their pharmacy: No  Patient reports adherence concerns with their medications:  Yes  due to cost   Diabetes:  Current medications: Januvia 25mg  daily  Medications tried in the past:  Farxiga, Invokana - not started due to cost. Metformin ER 500mg  - unable to find why stopped and patient cannot recall.   Current glucose readings: 181 Testing blood glucose once daily    Patient denies hypoglycemic s/sx including no dizziness, shakiness, sweating. Patient denies hyperglycemic symptoms including no polyuria, polydipsia, polyphagia, nocturia, neuropathy, blurred vision.  Current meal patterns: usually only eats 2 meals per day.    Hyperlipidemia/ASCVD Risk Reduction  Current lipid lowering medications: none Medications tried in the past: took rosuvastatin in the past around 2021 and 2022 but patient does not remember taking or why stopped.   Antiplatelet regimen: Xarelto 20mg  daily for afib  Patent is noted to have CAD and CHF. Last BNP was slightly elevated.   BNP (last 3 results) No results for input(s): "BNP" in the last 8760  hours.  ProBNP (last 3 results) Recent Labs    10/13/22 1034  PROBNP 173.0*      Objective:  Lab Results  Component Value Date   HGBA1C 9.2 (H) 10/13/2022    Lab Results  Component Value Date   CREATININE 1.19 10/27/2022   BUN 23 10/27/2022   NA 139 10/27/2022   K 4.7 10/27/2022   CL 103 10/27/2022   CO2 28 10/27/2022    Lab Results  Component Value Date   CHOL 209 (H) 10/29/2021   HDL 41.10 10/29/2021   LDLCALC 140 (H) 10/29/2021   TRIG 138.0 10/29/2021   CHOLHDL 5 10/29/2021    Medications Reviewed Today     Reviewed by Henrene Pastor, RPH-CPP (Pharmacist) on 11/09/22 at 1650  Med List Status: <None>   Medication Order Taking? Sig Documenting Provider Last Dose Status Informant  Accu-Chek FastClix Lancets MISC [id number]  Use 1 lancet daily to test blood sugar (E11.9) [provider]  Active Self  ammonium lactate (LAC-HYDRIN) 12 % lotion [id number]  Apply 1 Application topically as needed for dry skin.  Patient not taking: Reported on 11/04/2022   Whitney, Michael Dredge, PA-C  Active   azelastine (ASTELIN) 0.1 % nasal spray [id number]  Place 2 sprays into both nostrils 2 (two) times daily. Use in each nostril as directed Whitney, Michael Dredge, PA-C  Active            Med Note Michael Whitney, Michael Whitney   Tue Apr 21, 2022  9:32 AM) PRN  cetirizine (ZYRTEC) 10 MG tablet [id number]  Take 1 tablet (10 mg total) by mouth 2 (two) times daily. [name],  MD  Active   Cholecalciferol (VITAMIN D-3) 25 MCG (1000 UT) CAPS [id number] Yes Take 1,000 Units by mouth daily. [provider] Taking Active Self  famotidine (PEPCID) 20 MG tablet [id number]  TAKE 1 TABLET(20 MG) BY MOUTH TWICE DAILY [name], MD  Active   hydrOXYzine (VISTARIL) 25 MG capsule [id number]  TAKE 1 CAPSULE(25 MG) BY MOUTH AT BEDTIME AS NEEDED  Patient not taking: Reported on 11/04/2022   [name]  Active   JANUVIA 25 MG tablet [id number] Yes TAKE 1 TABLET(25 MG) BY MOUTH DAILY Whitney,  Michael Mc Taking Active   levothyroxine (SYNTHROID) 50 MCG tablet [id number] Yes TAKE 1 TABLET(50 MCG) BY MOUTH DAILY Whitney, Michael Mc Taking Active   losartan (COZAAR) 100 MG tablet [id number] Yes TAKE 1 TABLET(100 MG) BY MOUTH DAILY Whitney, Michael M, MD Taking Active   mometasone (ELOCON) 0.1 % cream [id number]  Apply topically daily as needed. [name], MD  Active   Multiple Vitamin (MULTI-VITAMIN) tablet [id number]  Take 1 tablet by mouth daily. [provider]  Active Self  Omega-3 Fatty Acids (FISH OIL) 1000 MG CAPS [id number]  Take 1,000 mg by mouth daily. [provider]  Active Self  OVER THE COUNTER MEDICATION [id number] Yes Take 6 drops by mouth daily. Vitamins A, D, and K [provider] Taking Active   rivaroxaban (XARELTO) 20 MG TABS tablet [id number] Yes Take 1 tablet (20 mg total) by mouth daily with supper. Whitney, Michael M, MD Taking Active   vitamin B-12 (CYANOCOBALAMIN) 1000 MCG tablet [id number]  Take 1,000 mcg by mouth daily. [provider]  Active Self  Vitamin D, Ergocalciferol, (DRISDOL) 1.25 MG (50000 UNIT) CAPS capsule [id number] No Take 1 capsule (50,000 Units total) by mouth every 7 (seven) days.  Patient not taking: Reported on 11/04/2022   Michael Schultz, DO Not Taking Active               Assessment/Plan:   Diabetes:Currently not at goal due to low adherence / med cost.  - Reviewed long term cardiovascular and renal outcomes of uncontrolled blood sugar - Reviewed goal A1c, goal fasting, and goal 2 hour post prandial glucose - Recommend to continue Januvia 25mg  daily since he just purchased 90 day supply. He has only taken 1 tablet so far.   - Recommend to check glucose daily  - [place], [employer] medication assistance [place]. Would like to use SGLT2 since he has had elevated BNP. Will collaborate with provider, CPhT, and patient to pursue assistance base on PCP recommendation for  diabetes treatment.    Hyperlipidemia/ASCVD Risk Reduction: Currently not at LDL goal of < 70. Not taking statin.  - Reviewed long term complications of uncontrolled cholesterol - Recommend to start rosuvastatin 20mg  daily - patient is a little more open to this option but will discuss with PCP next week.  .    Follow Up Plan: 2 to 4 weeks.   Henrene Pastor, PharmD Clinical Pharmacist [facility]

## 2022-11-10 DIAGNOSIS — R001 Bradycardia, unspecified: Secondary | ICD-10-CM | POA: Diagnosis not present

## 2022-11-10 DIAGNOSIS — I4821 Permanent atrial fibrillation: Secondary | ICD-10-CM | POA: Diagnosis not present

## 2022-11-13 DIAGNOSIS — H5053 Vertical heterophoria: Secondary | ICD-10-CM | POA: Diagnosis not present

## 2022-11-13 DIAGNOSIS — E0501 Thyrotoxicosis with diffuse goiter with thyrotoxic crisis or storm: Secondary | ICD-10-CM | POA: Diagnosis not present

## 2022-11-17 ENCOUNTER — Encounter: Payer: Self-pay | Admitting: Medical

## 2022-11-17 ENCOUNTER — Ambulatory Visit (HOSPITAL_BASED_OUTPATIENT_CLINIC_OR_DEPARTMENT_OTHER)
Admission: RE | Admit: 2022-11-17 | Discharge: 2022-11-17 | Disposition: A | Discharge status: Home / Self Care | Payer: Medicare HMO | Source: Ambulatory Visit | Attending: Medical | Admitting: Medical

## 2022-11-17 ENCOUNTER — Ambulatory Visit: Payer: Medicare HMO | Admitting: Medical

## 2022-11-17 VITALS — BP 132/88 | HR 58 | Temp 98.1°F | Resp 20 | Ht 75.0 in | Wt 289.6 lb

## 2022-11-17 DIAGNOSIS — Z87891 Personal history of nicotine dependence: Secondary | ICD-10-CM

## 2022-11-17 DIAGNOSIS — H532 Diplopia: Secondary | ICD-10-CM | POA: Diagnosis not present

## 2022-11-17 DIAGNOSIS — I10 Essential (primary) hypertension: Secondary | ICD-10-CM | POA: Diagnosis not present

## 2022-11-17 DIAGNOSIS — R062 Wheezing: Secondary | ICD-10-CM | POA: Diagnosis not present

## 2022-11-17 DIAGNOSIS — G4739 Other sleep apnea: Secondary | ICD-10-CM

## 2022-11-17 DIAGNOSIS — E119 Type 2 diabetes mellitus without complications: Secondary | ICD-10-CM

## 2022-11-17 MED ORDER — ALBUTEROL SULFATE HFA 108 (90 BASE) MCG/ACT IN AERS: 2.0000 | INHALATION_SPRAY | Freq: Four times a day (QID) | RESPIRATORY_TRACT | 0 refills | Status: DC | PRN

## 2022-11-17 NOTE — Progress Notes (Signed)
Subjective:    Patient ID: Michael Whitney, male    DOB: [birth year], 72 y.o.   MRN: [id number]  HPI   Pt in for follow up.   Pt has hx of double vison. He states he just saw eye MD yesterday. Looking into possible surgery. Pt states saw Nancy Marus MD. Pt describes surgery will be on extraocular muscles. Eye MD thinks dizziness is related to the double vision.   Diabetic pt. Recent A1c of 9.1. pt is on januvia. Pt did get farxiga. It cost $400. Pt did talk with our pharmacist who thinks can help get med at cheaper price. Recently blood sugar range 130-170.  Pt states recently when he breaths could hear wheezy sound from his chest. Present for 3 days. Remote hx of pipe smoker.  Pt has cpap. He states has 74 year old machine. He states has been about year since gotten any new supplies.    Hypothyroid- on levothyroxine 50 mcg daily.   Chronic Diastolic CHF Echo showed LV function of 50 to 55%.    Mildly reduced RV function. Mildly elevated LV filling pressures at cath. Patient has Lasix 20mg  daily to take PRN.    Permanent Atrial Fibrillation  No AV nodal agents due to baseline bradycardia. On Xarelto.  Hypertension  Continue Losartan    Hyperlipidemia Now on high dose statin.    Review of Systems  Constitutional:  Negative for chills, fatigue and fever.  HENT:  Negative for congestion and facial swelling.   Eyes:        Chronic double vision and dizziness.  Respiratory:  Positive for wheezing. Negative for cough, chest tightness and shortness of breath.   Cardiovascular:  Negative for chest pain and palpitations.  Gastrointestinal:  Negative for abdominal pain.  Genitourinary:  Negative for dysuria, flank pain and frequency.  Musculoskeletal:  Negative for back pain and myalgias.  Neurological:  Negative for facial asymmetry and light-headedness.  Hematological:  Negative for adenopathy.  Psychiatric/Behavioral:  Negative for behavioral problems and confusion.      Past Medical History:  Diagnosis Date   Atrial fibrillation (HCC)    Cataract    Diabetes mellitus without complication (HCC)    Hypertension    Seizures (HCC)    Thought to be related to something his GF gave him     Social History   Socioeconomic History   Marital status: Widowed    Spouse name: Not on file   Number of children: 2   Years of education: Not on file   Highest education level: Not on file  Occupational History   Occupation: retired    Comment: Environmental manager  Tobacco Use   Smoking status: Never   Smokeless tobacco: Never  Vaping Use   Vaping status: Never Used  Substance and Sexual Activity   Alcohol use: Yes    Alcohol/week: 2.0 standard drinks of alcohol    Types: 1 Shots of liquor, 1 Cans of beer per week    Comment: occasionally   Drug use: No   Sexual activity: Not on file  Other Topics Concern   Not on file  Social History Narrative   Not on file   Social Determinants of Health   Financial Resource Strain: Low Risk  (08/21/2022)   Overall Financial Resource Strain (CARDIA)    Difficulty of Paying Living Expenses: Not hard at all  Food Insecurity: No Food Insecurity (08/21/2022)   Hunger Vital Sign    Worried About Running Out of Food  in the Last Year: Never true    Ran Out of Food in the Last Year: Never true  Transportation Needs: No Transportation Needs (08/21/2022)   PRAPARE - Administrator, Civil Service (Medical): No    Lack of Transportation (Non-Medical): No  Physical Activity: Sufficiently Active (08/21/2022)   Exercise Vital Sign    Days of Exercise per Week: 5 days    Minutes of Exercise per Session: 70 min  Stress: Stress Concern Present (08/21/2022)   [employer] of Occupational Health - Occupational Stress Questionnaire    Feeling of Stress : To some extent  Social Connections: Socially Isolated (08/21/2022)   Social Connection and Isolation Panel [NHANES]    Frequency of Communication with Friends and  Family: More than three times a week    Frequency of Social Gatherings with Friends and Family: Once a week    Attends Religious Services: Never    Database administrator or Organizations: No    Attends Banker Meetings: Never    Marital Status: Widowed  Intimate Partner Violence: Unknown (06/21/2022)   [facility]   HITS    Physically Hurt: Not on file    Insult or Talk Down To: Not on file    Threaten Physical Harm: Not on file    Scream or Curse: Not on file    Past Surgical History:  Procedure Laterality Date   CATARACT EXTRACTION     PVC ABLATION N/A 07/14/2017   Procedure: PVC ABLATION;  Surgeon: Marinus Maw, MD;  Location: [facility] INVASIVE CV LAB;  Service: Cardiovascular;  Laterality: N/A;   RIGHT/LEFT HEART CATH AND CORONARY ANGIOGRAPHY N/A 10/24/2020   Procedure: RIGHT/LEFT HEART CATH AND CORONARY ANGIOGRAPHY;  Surgeon: Swaziland, Peter M, MD;  Location: [facility] INVASIVE CV LAB;  Service: Cardiovascular;  Laterality: N/A;   TONSILLECTOMY     VARICOSE VEIN SURGERY      Family History  Problem Relation Age of Onset   CAD Mother        MI at age 50    Allergies  Allergen Reactions   Amlodipine Other (See Comments)    Dizziness  dizziness    Current Outpatient Medications on File Prior to Visit  Medication Sig Dispense Refill   Accu-Chek FastClix Lancets MISC Use 1 lancet daily to test blood sugar (E11.9)     azelastine (ASTELIN) 0.1 % nasal spray Place 2 sprays into both nostrils 2 (two) times daily. Use in each nostril as directed 30 mL 0   cetirizine (ZYRTEC) 10 MG tablet Take 1 tablet (10 mg total) by mouth 2 (two) times daily. 60 tablet 3   Cholecalciferol (VITAMIN D-3) 25 MCG (1000 UT) CAPS Take 1,000 Units by mouth daily.     famotidine (PEPCID) 20 MG tablet TAKE 1 TABLET(20 MG) BY MOUTH TWICE DAILY 60 tablet 3   JANUVIA 25 MG tablet TAKE 1 TABLET(25 MG) BY MOUTH DAILY 30 tablet 3   levothyroxine (SYNTHROID) 50 MCG tablet  TAKE 1 TABLET(50 MCG) BY MOUTH DAILY 90 tablet 3   losartan (COZAAR) 100 MG tablet TAKE 1 TABLET(100 MG) BY MOUTH DAILY 90 tablet 3   mometasone (ELOCON) 0.1 % cream Apply topically daily as needed. 45 g 1   Multiple Vitamin (MULTI-VITAMIN) tablet Take 1 tablet by mouth daily.     Omega-3 Fatty Acids (FISH OIL) 1000 MG CAPS Take 1,000 mg by mouth daily.     OVER THE COUNTER MEDICATION Take 6 drops  by mouth daily. Vitamins A, D, and K     rivaroxaban (XARELTO) 20 MG TABS tablet Take 1 tablet (20 mg total) by mouth daily with supper. 90 tablet 3   vitamin B-12 (CYANOCOBALAMIN) 1000 MCG tablet Take 1,000 mcg by mouth daily.     ammonium lactate (LAC-HYDRIN) 12 % lotion Apply 1 Application topically as needed for dry skin. (Patient not taking: Reported on 11/04/2022) 400 g 0   hydrOXYzine (VISTARIL) 25 MG capsule TAKE 1 CAPSULE(25 MG) BY MOUTH AT BEDTIME AS NEEDED (Patient not taking: Reported on 11/04/2022) 30 capsule 0   Vitamin D, Ergocalciferol, (DRISDOL) 1.25 MG (50000 UNIT) CAPS capsule Take 1 capsule (50,000 Units total) by mouth every 7 (seven) days. (Patient not taking: Reported on 11/04/2022) 5 capsule 12   No current facility-administered medications on file prior to visit.    BP 132/88 (BP Location: Left Arm, Patient Position: Sitting, Cuff Size: Large)   Pulse (!) 58   Temp 98.1 F (36.7 C) (Oral)   Resp 20   Ht 6\' 3"  (1.905 m)   Wt 289 lb 9.6 oz (131.4 kg)   SpO2 96%   BMI 36.20 kg/m        Objective:   Physical Exam  General Mental Status- Alert. General Appearance- Not in acute distress.   Skin General: Color- Normal Color. Moisture- Normal Moisture.  Neck Carotid Arteries- Normal color. Moisture- Normal Moisture. No carotid bruits. No JVD.  Chest and Lung Exam Auscultation: Breath Sounds:-Normal.  Cardiovascular Auscultation:Rythm- Regular. Murmurs & Other Heart Sounds:Auscultation of the heart reveals- No Murmurs.  Abdomen Inspection:-Inspeection  Normal. Palpation/Percussion:Note:No mass. Palpation and Percussion of the abdomen reveal- Non Tender, Non Distended + BS, no rebound or guarding.   Neurologic Cranial Nerve exam:- CN III-XII intact(No nystagmus), symmetric smile. Strength:- 5/5 equal and symmetric strength both upper and lower extremities.       Assessment & Plan:   Patient Instructions  History of prior pipe smoking - DG Chest 2 View; Future   Wheezing - rx albuterol inhaler. 2 puffs every 6 hours as needed sob or wheezing. - DG Chest 2 View; Future  Other sleep apnea -refer for evaluation as you have 74 year old machine. - Ambulatory referral to Pulmonology  Hypertension, unspecified type -continue losartan.  Double vision - upcoming eye surgery and eye MD thinks vision is causing your dizziness. Extensive work up in past did not reveal cause.  Type 2 diabetes mellitus without complication, without long-term current use of insulin (HCC) -continue [place]. Please update me/confirm that you did fill farxiga or invoaka. I got work cost was $400 dollors thought you did not fill.   Future cmp and A1c. Can get done on 01-13-2023 or later.  Follow up date to be determined after xray and lab review. Sooner if needed.         Esperanza Richters, PA-C   Time spent with patient today was  40 minutes which consisted of chart review, discussing diagnosis, work up treatment and documentation.

## 2022-11-17 NOTE — Patient Instructions (Addendum)
History of prior pipe smoking - DG Chest 2 View; Future   Wheezing - rx albuterol inhaler. 2 puffs every 6 hours as needed sob or wheezing. - DG Chest 2 View; Future  Other sleep apnea -refer for evaluation as you have 74 year old machine. - Ambulatory referral to Pulmonology  Hypertension, unspecified type -continue losartan.  Double vision - upcoming eye surgery and eye MD thinks vision is causing your dizziness. Extensive work up in past did not reveal cause.  Type 2 diabetes mellitus without complication, without long-term current use of insulin (HCC) -continue [place]. Please update me/confirm that you did fill farxiga or invoaka. I got work cost was $400 dollors thought you did not fill.   Future cmp and A1c. Can get done on 01-13-2023 or later.  Follow up date to be determined after xray and lab review. Sooner if needed.

## 2022-11-18 ENCOUNTER — Telehealth: Payer: Self-pay | Admitting: Pharmacist

## 2022-11-18 ENCOUNTER — Telehealth: Payer: Medicare HMO

## 2022-11-18 MED ORDER — DAPAGLIFLOZIN PROPANEDIOL 10 MG PO TABS: 10.0000 mg | ORAL_TABLET | Freq: Every day | ORAL | 1 refills | Status: AC

## 2022-11-18 NOTE — Telephone Encounter (Signed)
Just restarted Januvia 25mg  daily - about 4 days ago. Not currently on SGLT2.  Recent blood glucose has been 160's to > 200 this morning.   PCP has approved starting [place]. Sent in Rx for Farxiga 10mg  daily . Also provided pharmacy with coupon information for 30 days free.   Will consult with Med assist team to send patient applications for [place] and Januvia.   Also sent patient information about [facility] [employer] for lower cost Xarelto.

## 2022-11-20 ENCOUNTER — Other Ambulatory Visit (HOSPITAL_COMMUNITY): Payer: Self-pay

## 2022-11-27 ENCOUNTER — Telehealth: Payer: Self-pay

## 2022-11-27 NOTE — Telephone Encounter (Signed)
Spoke with pt. Pt was notified of monitor results. Pt will continue current medication and f/u as planned.  

## 2022-12-09 ENCOUNTER — Ambulatory Visit: Payer: Medicare HMO | Admitting: Pharmacist

## 2022-12-09 DIAGNOSIS — Z7984 Long term (current) use of oral hypoglycemic drugs: Secondary | ICD-10-CM | POA: Diagnosis not present

## 2022-12-09 DIAGNOSIS — E118 Type 2 diabetes mellitus with unspecified complications: Secondary | ICD-10-CM

## 2022-12-09 DIAGNOSIS — I4821 Permanent atrial fibrillation: Secondary | ICD-10-CM

## 2022-12-09 NOTE — Progress Notes (Signed)
12/09/2022 Name: Michael Whitney MRN: [id number] DOB: 07/05/1950  Chief Complaint  Patient presents with   Diabetes   Medication Management    Michael Whitney is a 72 y.o. year old male who presented for a telephone visit.   They were referred to the pharmacist by their PCP for assistance in managing diabetes, hyperlipidemia, and medication access.    Subjective:  Care Team: Primary Care Provider: [name] ; Next Scheduled Visit: not currently scheduled  Medication Access/Adherence  Current Pharmacy:  [id number], [place] - [address] AT [facility] [address] [place] [address] Phone: [phone] Fax: [phone]   Patient reports affordability concerns with their medications: Yes  - patient recently spent $822 for 90 days of Xarleto and Januvia.  Patient reports access/transportation concerns to their pharmacy: No  Patient reports adherence concerns with their medications:  Yes  due to cost   Diabetes:  Current medications: Januvia 25mg  daily, Farxiga 10mg  daily  Medications tried in the past:   Invokana - not started due to cost. Metformin ER 500mg  - unable to find why stopped and patient cannot recall.   Current glucose readings: 152, 147, 122, 119, 148 (at last visit his blood glucose was 181) Testing blood glucose once daily - mostly checking each morning / fasting    Patient denies hypoglycemic s/sx including no dizziness, shakiness, sweating. Patient denies hyperglycemic symptoms including no polyuria, polydipsia, polyphagia, nocturia, neuropathy.  He has had some double vision recently and will see ophthalmologist next week.   Current meal patterns: usually only eats 2 meals per day.    Hyperlipidemia/ASCVD Risk Reduction  Current lipid lowering medications: none Medications tried in the past: took rosuvastatin in the past around 2021 and 2022 but patient does not remember taking or why stopped.   Recommended retry rosuvastatin 20mg  daily at our last visit but patient declined - he seemed a little more open to this option but wanted to discuss with PCP at appointment in September.   Antiplatelet regimen: Xarelto 20mg  daily for afib  Patent is noted to have CAD and CHF. Last BNP was slightly elevated.   BNP (last 3 results) No results for input(s): "BNP" in the last 8760 hours.  ProBNP (last 3 results) Recent Labs    10/13/22 1034  PROBNP 173.0*      Objective:  Lab Results  Component Value Date   HGBA1C 9.2 (H) 10/13/2022    Lab Results  Component Value Date   CREATININE 1.19 10/27/2022   BUN 23 10/27/2022   NA 139 10/27/2022   K 4.7 10/27/2022   CL 103 10/27/2022   CO2 28 10/27/2022    Lab Results  Component Value Date   CHOL 209 (H) 10/29/2021   HDL 41.10 10/29/2021   LDLCALC 140 (H) 10/29/2021   TRIG 138.0 10/29/2021   CHOLHDL 5 10/29/2021    Medications Reviewed Today   Medications were not reviewed in this encounter       Assessment/Plan:   Diabetes:Currently not at goal due to low adherence / med cost. This has improved since he started [place].  - Reviewed long term cardiovascular and renal outcomes of uncontrolled blood sugar - Reviewed goal A1c, goal fasting, and goal 2 hour post prandial glucose - Recommend to continue Januvia 25mg  daily since he just purchased 90 day supply. He has only taken 1 tablet so far.   - Recommend to check glucose daily  -  Patient has received application for [place] and [place] and [employer] but has not completed yet.  Started On line [place] and [place] application with patient but was unable to complete today.    Hyperlipidemia/ASCVD Risk Reduction: Currently not at LDL goal of < 70. Not taking statin.  - Reviewed long term complications of uncontrolled cholesterol - Recommended statin therapy but patient declined.  - He is due to recheck lipids. Patient asked to postpone until after his appointment with opthalmology for double  vision next week.   Follow Up Plan: 2 to 4 weeks.   Henrene Pastor, PharmD Clinical Pharmacist [facility]   12/11/2022 - Addendum:  Spoke with patient today to assist with enrollment in [place] medication assistance program. Patient reports he met with his health insurance agent yesterday and was enrolled in [employer] plan for 2025. Patient states that his agent told him that he also was able to change to [employer] plan for the rest of 2025.  With the [employer] plan patient should have $0 cost for Fargixa and Januvia.  I did explain that Xarelto is not included in the $0 copay under this plan but cost would be $47 in 2025 (might still have to pay 25% thru 2024 while in the coverage gap but he will not have a coverage gap in 2025).  Will suspend applications for [place] and [place] and [employer] for now.  Patient will also transition over the [employer] [facility] [employer] for mail order option. Provided him with pharmacy phone number. He will call me if he needs any assistance with coordinating transition.   Henrene Pastor, PharmD Clinical Pharmacist

## 2022-12-14 NOTE — Telephone Encounter (Signed)
Per Tammy Eckard 12/11/22:  Jolene Schimke - I think you had sent Mr. Boyea applications for Alma Friendly / [employer] and Marcelline Deist / [place] and [place]. He has since met with his health insurance agent and states he will start [employer] plan at the beginning of November and will now have $0 for [place] and Farxiga. He will contact us if he needs anything further.

## 2022-12-15 DIAGNOSIS — H5021 Vertical strabismus, right eye: Secondary | ICD-10-CM | POA: Diagnosis not present

## 2022-12-15 LAB — HM DIABETES EYE EXAM

## 2023-01-07 ENCOUNTER — Other Ambulatory Visit: Payer: Self-pay | Admitting: Medical

## 2023-01-28 ENCOUNTER — Other Ambulatory Visit: Payer: Self-pay | Admitting: Internal Medicine

## 2023-01-31 ENCOUNTER — Other Ambulatory Visit: Payer: Self-pay | Admitting: Medical

## 2023-02-02 ENCOUNTER — Encounter: Payer: Self-pay | Admitting: Dietician

## 2023-02-02 ENCOUNTER — Encounter: Payer: PPO | Attending: Medical | Admitting: Dietician

## 2023-02-02 VITALS — Wt 289.0 lb

## 2023-02-02 DIAGNOSIS — E1169 Type 2 diabetes mellitus with other specified complication: Secondary | ICD-10-CM | POA: Diagnosis not present

## 2023-02-02 NOTE — Progress Notes (Signed)
Diabetes Self-Management Education  Visit Type: First/Initial  Appt. Start Time: 1400 Appt. End Time: 1500  02/02/2023  [name], identified by name and date of birth, is a 71 y.o. male with a diagnosis of Diabetes: Type 2.   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   Pt reports he has been having issues with vision (seeing double) and balance. Pt reports with his glasses he can read fine but states he is currently seeing double. Pt states this has caused issues with his ability to exercise. [facility] but hasn't been going. Pt states he is considering getting a Systems analyst. Discussed recumbent bike with pt. Pt states he has some weights at home he tries to use or does yoga.   Pt reports he checks his blood sugar in the morning and it is usually 150. Pt states he sleeps well from 10pm-7am, takes levothyroxine at 6am.   Pt states he feels like he continues to gain weight. Pt states he was consistently in the 230s but since his thyroid issues started has consistently gained over the last 10 years. Pt states he has tried different things to lose weight but hasn't been able to.   Weight 289 lb (131.1 kg). Body mass index is 36.12 kg/m.   Diabetes Self-Management Education - 02/02/23 1355       Visit Information   Visit Type First/Initial      Initial Visit   Diabetes Type Type 2    Date Diagnosed 2022    Are you currently following a meal plan? No    Are you taking your medications as prescribed? Yes      Health Coping   How would you rate your overall health? Fair      Psychosocial Assessment   Patient Belief/Attitude about Diabetes Motivated to  manage diabetes    What is the hardest part about your diabetes right now, causing you the most concern, or is the most worrisome to you about your diabetes?   Making healty food and beverage choices    Self-care barriers None    Self-management support Doctor's office    Other persons present Patient    Patient Concerns Healthy Lifestyle    Special Needs None    Preferred Learning Style No preference indicated    Learning Readiness Ready    How often do you need to have someone help you when you read instructions, pamphlets, or other written materials from your doctor or pharmacy? 3 - Sometimes    What is the last grade level you completed in school? n/a      Pre-Education Assessment   Patient understands the diabetes disease and treatment process. Needs Instruction    Patient understands incorporating nutritional management into lifestyle. Needs Instruction    Patient undertands incorporating physical activity into lifestyle. Needs Instruction    Patient understands using medications safely. Needs Instruction    Patient understands monitoring blood glucose, interpreting and using results Needs Instruction    Patient understands prevention, detection, and treatment of acute complications. Needs Instruction    Patient understands prevention, detection, and treatment of chronic complications. Needs Instruction    Patient understands how to develop strategies to address  psychosocial issues. Needs Instruction    Patient understands how to develop strategies to promote health/change behavior. Needs Instruction      Complications   Last HgB A1C per patient/outside source 9.2 %    How often do you check your blood sugar? 1-2 times/day    Fasting Blood glucose range (mg/dL) 409-811    Postprandial Blood glucose range (mg/dL) 91-478    Have you had a dilated eye exam in the past 12 months? Yes    Have you had a dental exam in the past 12 months? No    Are you checking your feet? Yes    How  many days per week are you checking your feet? 2      Dietary Intake   Breakfast omelette with swiss, [place] bacon OR oatmeal    Snack (morning) none    Lunch protein shake OR fruit OR salad OR peanut butter    Snack (afternoon) none    Dinner vegetables and protein (meat)    Snack (evening) none    Beverage(s) 3 cups water, coffee      Activity / Exercise   Activity / Exercise Type Light (walking / raking leaves)    How many days per week do you exercise? 3    How many minutes per day do you exercise? 15    Total minutes per week of exercise 45      Patient Education   Previous Diabetes Education No    Disease Pathophysiology Explored patient's options for treatment of their diabetes;Factors that contribute to the development of diabetes    Healthy Eating Role of diet in the treatment of diabetes and the relationship between the three main macronutrients and blood glucose level;Plate Method;Reviewed blood glucose goals for pre and post meals and how to evaluate the patients' food intake on their blood glucose level.;Meal timing in regards to the patients' current diabetes medication.;Meal options for control of blood glucose level and chronic complications.    Being Active Role of exercise on diabetes management, blood pressure control and cardiac health.;Helped patient identify appropriate exercises in relation to his/her diabetes, diabetes complications and other health issue.    Medications Reviewed patients medication for diabetes, action, purpose, timing of dose and side effects.    Monitoring Identified appropriate SMBG and/or A1C goals.;Daily foot exams;Yearly dilated eye exam    Acute complications Discussed and identified patients' prevention, symptoms, and treatment of hyperglycemia.    Chronic complications Relationship between chronic complications and blood glucose control;Identified and discussed with patient  current chronic complications    Diabetes Stress and Support  Identified and addressed patients feelings and concerns about diabetes;Role of stress on diabetes    Lifestyle and Health Coping Lifestyle issues that need to be addressed for better diabetes care      Individualized Goals (developed by patient)   Nutrition General guidelines for healthy choices and portions discussed    Physical Activity Exercise 3-5 times per week;30 minutes per day    Medications take my medication as prescribed    Monitoring  Test my blood glucose as discussed    Problem Solving Eating Pattern    Reducing Risk examine blood glucose patterns;do foot checks daily;treat hypoglycemia with 15 grams of carbs if blood glucose less than 70mg /dL    Health Coping Ask for help with psychological, social, or emotional issues      Post-Education Assessment   Patient understands the diabetes disease and treatment process. Comprehends key points    Patient understands  incorporating nutritional management into lifestyle. Comprehends key points    Patient undertands incorporating physical activity into lifestyle. Comprehends key points    Patient understands using medications safely. Comphrehends key points    Patient understands monitoring blood glucose, interpreting and using results Comprehends key points    Patient understands prevention, detection, and treatment of acute complications. Comprehends key points    Patient understands prevention, detection, and treatment of chronic complications. Comprehends key points    Patient understands how to develop strategies to address psychosocial issues. Comprehends key points    Patient understands how to develop strategies to promote health/change behavior. Comprehends key points      Outcomes   Expected Outcomes Demonstrated interest in learning. Expect positive outcomes    Future DMSE 3-4 months    Program Status Not Completed             Individualized Plan for Diabetes Self-Management Training:   Learning Objective:  Patient  will have a greater understanding of diabetes self-management. Patient education plan is to attend individual and/or group sessions per assessed needs and concerns.   Plan:   Patient Instructions  Goals Established by Patient:  Exercise 3 times a week for 30 minutes. (Consider getting a Systems analyst) Try the recumbent bike.   Aim to drink 5-6 of your glasses of water a day.   Eat at least 3 times per day (even if it's just something small with complex carb + protein).   Consider getting your A1c checked again.   Expected Outcomes:  Demonstrated interest in learning. Expect positive outcomes  Education material provided: ADA - How to Thrive: A Guide for Your Journey with Diabetes and My Plate  If problems or questions, patient to contact team via:  Phone  Future DSME appointment: 3-4 months

## 2023-02-02 NOTE — Patient Instructions (Addendum)
Goals Established by Patient:  Exercise 3 times a week for 30 minutes. (Consider getting a Systems analyst) Try the recumbent bike.   Aim to drink 5-6 of your glasses of water a day.   Eat at least 3 times per day (even if it's just something small with complex carb + protein).   Consider getting your A1c checked again.

## 2023-02-09 ENCOUNTER — Ambulatory Visit (INDEPENDENT_AMBULATORY_CARE_PROVIDER_SITE_OTHER): Payer: PPO | Admitting: Medical

## 2023-02-09 ENCOUNTER — Ambulatory Visit (HOSPITAL_BASED_OUTPATIENT_CLINIC_OR_DEPARTMENT_OTHER)
Admission: RE | Admit: 2023-02-09 | Discharge: 2023-02-09 | Disposition: A | Discharge status: Home / Self Care | Payer: PPO | Source: Ambulatory Visit | Attending: Medical | Admitting: Medical

## 2023-02-09 ENCOUNTER — Encounter: Payer: Self-pay | Admitting: Medical

## 2023-02-09 VITALS — BP 140/70 | HR 84 | Temp 97.8°F | Resp 18 | Ht 75.0 in | Wt 287.0 lb

## 2023-02-09 DIAGNOSIS — R5383 Other fatigue: Secondary | ICD-10-CM

## 2023-02-09 DIAGNOSIS — R062 Wheezing: Secondary | ICD-10-CM | POA: Insufficient documentation

## 2023-02-09 DIAGNOSIS — Z7984 Long term (current) use of oral hypoglycemic drugs: Secondary | ICD-10-CM

## 2023-02-09 DIAGNOSIS — J40 Bronchitis, not specified as acute or chronic: Secondary | ICD-10-CM

## 2023-02-09 DIAGNOSIS — R059 Cough, unspecified: Secondary | ICD-10-CM

## 2023-02-09 DIAGNOSIS — R058 Other specified cough: Secondary | ICD-10-CM | POA: Diagnosis not present

## 2023-02-09 DIAGNOSIS — E039 Hypothyroidism, unspecified: Secondary | ICD-10-CM | POA: Diagnosis not present

## 2023-02-09 DIAGNOSIS — Z7985 Long-term (current) use of injectable non-insulin antidiabetic drugs: Secondary | ICD-10-CM

## 2023-02-09 DIAGNOSIS — E118 Type 2 diabetes mellitus with unspecified complications: Secondary | ICD-10-CM

## 2023-02-09 LAB — COMPREHENSIVE METABOLIC PANEL
ALT: 18 U/L (ref 0–53)
AST: 18 U/L (ref 0–37)
Albumin: 4.5 g/dL (ref 3.5–5.2)
Alkaline Phosphatase: 51 U/L (ref 39–117)
BUN: 26 mg/dL — ABNORMAL HIGH (ref 6–23)
CO2: 28 meq/L (ref 19–32)
Calcium: 9.6 mg/dL (ref 8.4–10.5)
Chloride: 102 meq/L (ref 96–112)
Creatinine, Ser: 1.27 mg/dL (ref 0.40–1.50)
GFR: 55.64 mL/min — ABNORMAL LOW (ref >60.00)
Glucose, Bld: 123 mg/dL — ABNORMAL HIGH (ref 70–99)
Potassium: 4.5 meq/L (ref 3.5–5.1)
Sodium: 138 meq/L (ref 135–145)
Total Bilirubin: 0.9 mg/dL (ref 0.2–1.2)
Total Protein: 6.7 g/dL (ref 6.0–8.3)

## 2023-02-09 LAB — CBC WITH DIFFERENTIAL/PLATELET
Basophils Absolute: 0.1 10*3/uL (ref 0.0–0.1)
Basophils Relative: 1.4 % (ref 0.0–3.0)
Eosinophils Absolute: 0.1 10*3/uL (ref 0.0–0.7)
Eosinophils Relative: 1.7 % (ref 0.0–5.0)
HCT: 51 % (ref 39.0–52.0)
Hemoglobin: 16.7 g/dL (ref 13.0–17.0)
Lymphocytes Relative: 22 % (ref 12.0–46.0)
Lymphs Abs: 1.5 10*3/uL (ref 0.7–4.0)
MCHC: 32.8 g/dL (ref 30.0–36.0)
MCV: 103.1 fL — ABNORMAL HIGH (ref 78.0–100.0)
Monocytes Absolute: 0.8 10*3/uL (ref 0.1–1.0)
Monocytes Relative: 11.5 % (ref 3.0–12.0)
Neutro Abs: 4.2 10*3/uL (ref 1.4–7.7)
Neutrophils Relative %: 63.4 % (ref 43.0–77.0)
Platelets: 162 10*3/uL (ref 150.0–400.0)
RBC: 4.95 Mil/uL (ref 4.22–5.81)
RDW: 13.9 % (ref 11.5–15.5)
WBC: 6.6 10*3/uL (ref 4.0–10.5)

## 2023-02-09 LAB — HEMOGLOBIN A1C: Hgb A1c MFr Bld: 7 % — ABNORMAL HIGH (ref 4.6–6.5)

## 2023-02-09 LAB — T4, FREE: Free T4: 0.67 ng/dL (ref 0.60–1.60)

## 2023-02-09 LAB — TSH: TSH: 4.43 u[IU]/mL (ref 0.35–5.50)

## 2023-02-09 MED ORDER — BENZONATATE 100 MG PO CAPS: 100.0000 mg | ORAL_CAPSULE | Freq: Three times a day (TID) | ORAL | 0 refills | Status: AC | PRN

## 2023-02-09 MED ORDER — FLUTICASONE PROPIONATE 50 MCG/ACT NA SUSP: 2.0000 | Freq: Every day | NASAL | 1 refills | Status: DC

## 2023-02-09 MED ORDER — ALBUTEROL SULFATE HFA 108 (90 BASE) MCG/ACT IN AERS: 2.0000 | INHALATION_SPRAY | Freq: Four times a day (QID) | RESPIRATORY_TRACT | 0 refills | Status: DC | PRN

## 2023-02-09 MED ORDER — AZITHROMYCIN 250 MG PO TABS: ORAL_TABLET | ORAL | 0 refills | Status: AC

## 2023-02-09 NOTE — Progress Notes (Unsigned)
Subjective:    Patient ID: Michael Whitney, male    DOB: 08-05-1950, 72 y.o.   MRN: [id number]  HPI  Discussed the use of AI scribe software for clinical note transcription with the patient, who gave verbal consent to proceed.  History of Present Illness   The patient, with a history of diabetes and hypertension, presented with a recent onset of respiratory symptoms. They reported a sporadic wheezing and productive cough, with mucus described as slightly yellowish mucus. The symptoms began on a Thursday, with the patient noting increased fatigue since the onset. They denied experiencing fever, chills, or sweats. The patient also reported a runny nose and post-nasal drainage, but denied nasal congestion.  The patient has a remote history of smoking, having quit approximately 20-30 years ago after a 10-year period of smoking pipes and cigarettes intermittently. They have been managing their symptoms with over-the-counter Mucinex, as advised by a pharmacist, but reported minimal relief.  In terms of their chronic conditions, the patient has been on Januvia 25mg  and Farxiga 10mg  for diabetes, and Losartan 100mg  for hypertension. They reported their blood pressure readings at home were typically in the 120s to 130s over 70, but noted that readings were often higher during medical appointments. They also mentioned a recent A1c level of 9.0, indicating suboptimal glycemic control.        Review of Systems  Constitutional:  Negative for chills, fatigue and fever.  HENT:  Positive for postnasal drip. Negative for congestion and drooling.   Respiratory:  Positive for wheezing. Negative for cough, chest tightness and shortness of breath.   Cardiovascular:  Negative for chest pain and palpitations.  Gastrointestinal:  Negative for abdominal pain.  Genitourinary:  Negative for dysuria, flank pain and frequency.  Musculoskeletal:  Negative for back pain and gait problem.  Skin:  Negative for rash.   Neurological:  Negative for dizziness, light-headedness and headaches.  Hematological:  Negative for adenopathy. Does not bruise/bleed easily.  Psychiatric/Behavioral:  Negative for behavioral problems and decreased concentration.     Past Medical History:  Diagnosis Date   Atrial fibrillation (HCC)    Cataract    Diabetes mellitus without complication (HCC)    Hypertension    Seizures (HCC)    Thought to be related to something his GF gave him     Social History   Socioeconomic History   Marital status: Widowed    Spouse name: Not on file   Number of children: 2   Years of education: Not on file   Highest education level: Not on file  Occupational History   Occupation: retired    Comment: Environmental manager  Tobacco Use   Smoking status: Never   Smokeless tobacco: Never  Vaping Use   Vaping status: Never Used  Substance and Sexual Activity   Alcohol use: Yes    Alcohol/week: 2.0 standard drinks of alcohol    Types: 1 Shots of liquor, 1 Cans of beer per week    Comment: occasionally   Drug use: No   Sexual activity: Not on file  Other Topics Concern   Not on file  Social History Narrative   Not on file   Social Drivers of Health   Financial Resource Strain: Low Risk  (08/21/2022)   Overall Financial Resource Strain (CARDIA)    Difficulty of Paying Living Expenses: Not hard at all  Food Insecurity: Food Insecurity Present (02/02/2023)   Hunger Vital Sign    Worried About Running Out of Food in the  Last Year: Sometimes true    Ran Out of Food in the Last Year: Never true  Transportation Needs: No Transportation Needs (08/21/2022)   PRAPARE - Administrator, Civil Service (Medical): No    Lack of Transportation (Non-Medical): No  Physical Activity: Sufficiently Active (08/21/2022)   Exercise Vital Sign    Days of Exercise per Week: 5 days    Minutes of Exercise per Session: 70 min  Stress: Stress Concern Present (08/21/2022)   [employer] of  Occupational Health - Occupational Stress Questionnaire    Feeling of Stress : To some extent  Social Connections: Socially Isolated (08/21/2022)   Social Connection and Isolation Panel [NHANES]    Frequency of Communication with Friends and Family: More than three times a week    Frequency of Social Gatherings with Friends and Family: Once a week    Attends Religious Services: Never    Database administrator or Organizations: No    Attends Banker Meetings: Never    Marital Status: Widowed  Intimate Partner Violence: Unknown (06/21/2022)   Received from [facility]   HITS    Physically Hurt: Not on file    Insult or Talk Down To: Not on file    Threaten Physical Harm: Not on file    Scream or Curse: Not on file    Past Surgical History:  Procedure Laterality Date   CATARACT EXTRACTION     PVC ABLATION N/A 07/14/2017   Procedure: PVC ABLATION;  Surgeon: Marinus Maw, MD;  Location: [facility] INVASIVE CV LAB;  Service: Cardiovascular;  Laterality: N/A;   RIGHT/LEFT HEART CATH AND CORONARY ANGIOGRAPHY N/A 10/24/2020   Procedure: RIGHT/LEFT HEART CATH AND CORONARY ANGIOGRAPHY;  Surgeon: Swaziland, Peter M, MD;  Location: [facility] INVASIVE CV LAB;  Service: Cardiovascular;  Laterality: N/A;   TONSILLECTOMY     VARICOSE VEIN SURGERY      Family History  Problem Relation Age of Onset   CAD Mother        MI at age 22    Allergies  Allergen Reactions   Amlodipine Other (See Comments)    Dizziness  dizziness    Current Outpatient Medications on File Prior to Visit  Medication Sig Dispense Refill   losartan (COZAAR) 100 MG tablet Take 1 tablet (100 mg total) by mouth daily. 30 tablet 0   Accu-Chek FastClix Lancets MISC Use 1 lancet daily to test blood sugar (E11.9)     ammonium lactate (LAC-HYDRIN) 12 % lotion Apply 1 Application topically as needed for dry skin. 400 g 0   azelastine (ASTELIN) 0.1 % nasal spray Place 2 sprays into both nostrils 2 (two) times daily.  Use in each nostril as directed 30 mL 0   cetirizine (ZYRTEC) 10 MG tablet Take 1 tablet (10 mg total) by mouth 2 (two) times daily. 60 tablet 3   Cholecalciferol (VITAMIN D-3) 25 MCG (1000 UT) CAPS Take 1,000 Units by mouth daily.     dapagliflozin propanediol (FARXIGA) 10 MG TABS tablet Take 1 tablet (10 mg total) by mouth daily before breakfast. 30 tablet 1   famotidine (PEPCID) 20 MG tablet TAKE 1 TABLET(20 MG) BY MOUTH TWICE DAILY 60 tablet 3   hydrOXYzine (VISTARIL) 25 MG capsule TAKE 1 CAPSULE(25 MG) BY MOUTH AT BEDTIME AS NEEDED 30 capsule 0   JANUVIA 25 MG tablet TAKE 1 TABLET(25 MG) BY MOUTH DAILY 30 tablet 3   levothyroxine (SYNTHROID) 50 MCG tablet TAKE 1 TABLET(50  MCG) BY MOUTH DAILY 90 tablet 3   mometasone (ELOCON) 0.1 % cream Apply topically daily as needed. 45 g 1   Multiple Vitamin (MULTI-VITAMIN) tablet Take 1 tablet by mouth daily.     Omega-3 Fatty Acids (FISH OIL) 1000 MG CAPS Take 1,000 mg by mouth daily.     OVER THE COUNTER MEDICATION Take 6 drops by mouth daily. Vitamins A, D, and K     vitamin B-12 (CYANOCOBALAMIN) 1000 MCG tablet Take 1,000 mcg by mouth daily.     Vitamin D, Ergocalciferol, (DRISDOL) 1.25 MG (50000 UNIT) CAPS capsule Take 1 capsule (50,000 Units total) by mouth every 7 (seven) days. 5 capsule 12   XARELTO 20 MG TABS tablet TAKE 1 TABLET(20 MG) BY MOUTH DAILY WITH SUPPER 90 tablet 3   No current facility-administered medications on file prior to visit.    BP (!) 140/70   Pulse 84   Temp 97.8 F (36.6 C)   Resp 18   Ht 6\' 3"  (1.905 m)   Wt 287 lb (130.2 kg)   SpO2 98%   BMI 35.87 kg/m        Objective:   Physical Exam  General Mental Status- Alert. General Appearance- Not in acute distress.   Skin General: Color- Normal Color. Moisture- Normal Moisture.  Neck Carotid Arteries- Normal color. Moisture- Normal Moisture. No carotid bruits. No JVD.  Chest and Lung Exam Auscultation: Breath Sounds:-faint minimal expiratory wheeze at  base. Even unlabored.  Cardiovascular Auscultation:Rythm- RRR Murmurs & Other Heart Sounds:Auscultation of the heart reveals- No Murmurs.  Abdomen Inspection:-Inspeection Normal. Palpation/Percussion:Note:No mass. Palpation and Percussion of the abdomen reveal- Non Tender, Non Distended + BS, no rebound or guarding.   Neurologic Cranial Nerve exam:- CN III-XII intact(No nystagmus), symmetric smile. .Finger to Nose:- Normal/Intact Strength:- 5/5 equal and symmetric strength both upper and lower extremities.       Assessment & Plan:   Assessment and Plan    Patient Instructions  Bronchitis  Recent onset of sporadic wheezing, productive cough with yellowish mucus, and fatigue. No fever or muscle aches reported. Remote history of smoking (pipe and cigarettes) for approximately 10 years. -Start Azithromycin for 5 days. -Order chest X-ray to rule out pneumonia. -Prescribe Benzonatate for cough. -If nasal congestion develops, start nasal spray flonase. -Provide Albuterol inhaler for potential wheezing.  Type 2 Diabetes Mellitus Currently on Januvia 25mg  and Farxiga 10mg  daily. Last A1C was elevated. -Continue current medications. -Order metabolic panel and A1C. -Request patient to provide preferred glucometer brand for prescription.  Hypertension Currently on Losartan 100mg  daily. Blood pressure slightly elevated during visit but reported as normal at home. -Continue Losartan 100mg  daily. -Advise patient to continue home blood pressure monitoring.  Thyroid Disease -Order TSH and T4 levels. -Advise patient to provide preferred endocrinologist's name for referral.   Follow up in 2 weeks or sooner if needed        [employer], PA-C

## 2023-02-09 NOTE — Patient Instructions (Addendum)
Bronchitis  Recent onset of sporadic wheezing, productive cough with yellowish mucus, and fatigue. No fever or muscle aches reported. Remote history of smoking (pipe and cigarettes) for approximately 10 years. -Start Azithromycin for 5 days. -Order chest X-ray to rule out pneumonia. -Prescribe Benzonatate for cough. -If nasal congestion develops, start nasal spray flonase. -Provide Albuterol inhaler for potential wheezing.  Type 2 Diabetes Mellitus Currently on Januvia 25mg  and Farxiga 10mg  daily. Last A1C was elevated. -Continue current medications. -Order metabolic panel and A1C. -Request patient to provide preferred glucometer brand for prescription.  Hypertension Currently on Losartan 100mg  daily. Blood pressure slightly elevated during visit but reported as normal at home. -Continue Losartan 100mg  daily. -Advise patient to continue home blood pressure monitoring.  Thyroid Disease -Order TSH and T4 levels. -Advise patient to provide preferred endocrinologist's name for referral.   Follow up in 2 weeks or sooner if needed

## 2023-02-12 ENCOUNTER — Institutional Professional Consult (permissible substitution): Payer: Medicare HMO | Admitting: Nurse Practitioner

## 2023-02-12 ENCOUNTER — Telehealth: Payer: Self-pay

## 2023-02-12 NOTE — Telephone Encounter (Signed)
   Copied from CRM [id number]. Topic: Clinical - Medication Question >> Feb 12, 2023  8:32 AM Elizebeth Brooking wrote: Reason for CRM: Shanda Bumps from [facility] called regard patient, patient is enrolled in special program wants to verify if patient has diabetes or/and congestive heart failure. Is requesting a callback

## 2023-02-15 ENCOUNTER — Telehealth: Payer: Self-pay | Admitting: Medical

## 2023-02-15 NOTE — Telephone Encounter (Signed)
Copied from CRM [id number]. Topic: General - Other >> Feb 15, 2023 11:47 AM Irine Seal wrote: Reason for CRM: Glenna from [facility], they need a chronic condition verification form faxed over to [phone],  this is CMS requirement to stay enrolled needs to be completed before end of year to prevent pt from being un enrolled and losing insurance coverage  confirmed fax # [phone] docs were re-faxed 02/15/23.    Callback: [phone]

## 2023-02-15 NOTE — Telephone Encounter (Signed)
error 

## 2023-02-15 NOTE — Telephone Encounter (Signed)
Spoke with Michael Whitney , she stated she would relay a message to [place] to have forms refaxed to our office for pt

## 2023-02-15 NOTE — Telephone Encounter (Signed)
[place] called back and took verbal order for dx

## 2023-02-15 NOTE — Telephone Encounter (Signed)
Called number provided lady on the phone told me to call back in 15 minutes to get forms refaxed

## 2023-02-16 NOTE — Telephone Encounter (Signed)
Form received and faxed

## 2023-03-03 ENCOUNTER — Other Ambulatory Visit: Payer: Self-pay | Admitting: Medical

## 2023-03-18 DIAGNOSIS — I4821 Permanent atrial fibrillation: Secondary | ICD-10-CM | POA: Diagnosis not present

## 2023-03-18 DIAGNOSIS — G4733 Obstructive sleep apnea (adult) (pediatric): Secondary | ICD-10-CM | POA: Diagnosis not present

## 2023-03-18 DIAGNOSIS — E890 Postprocedural hypothyroidism: Secondary | ICD-10-CM | POA: Diagnosis not present

## 2023-03-18 DIAGNOSIS — E0500 Thyrotoxicosis with diffuse goiter without thyrotoxic crisis or storm: Secondary | ICD-10-CM | POA: Diagnosis not present

## 2023-03-18 DIAGNOSIS — E119 Type 2 diabetes mellitus without complications: Secondary | ICD-10-CM | POA: Diagnosis not present

## 2023-03-23 DIAGNOSIS — H3581 Retinal edema: Secondary | ICD-10-CM | POA: Diagnosis not present

## 2023-03-23 DIAGNOSIS — E0500 Thyrotoxicosis with diffuse goiter without thyrotoxic crisis or storm: Secondary | ICD-10-CM | POA: Diagnosis not present

## 2023-03-23 DIAGNOSIS — Z961 Presence of intraocular lens: Secondary | ICD-10-CM | POA: Diagnosis not present

## 2023-03-30 ENCOUNTER — Institutional Professional Consult (permissible substitution): Payer: Medicare HMO | Admitting: Adult Health

## 2023-03-31 ENCOUNTER — Ambulatory Visit: Payer: PPO | Admitting: Adult Health

## 2023-03-31 ENCOUNTER — Encounter: Payer: Self-pay | Admitting: Adult Health

## 2023-03-31 VITALS — BP 128/62 | HR 48 | Ht 75.0 in | Wt 290.0 lb

## 2023-03-31 DIAGNOSIS — G4733 Obstructive sleep apnea (adult) (pediatric): Secondary | ICD-10-CM | POA: Diagnosis not present

## 2023-03-31 DIAGNOSIS — J449 Chronic obstructive pulmonary disease, unspecified: Secondary | ICD-10-CM

## 2023-03-31 NOTE — Assessment & Plan Note (Signed)
 PFTs in March 2023 showed some moderate restriction along with mild daily response could have a component of some mild COPD.  He is a former pipe smoker.  Had no perceived benefit with Spiriva .  Does not have significant dyspnea currently.  May use his albuterol  as needed suspect his shortness of breath is multifactorial.  Plan  Patient Instructions  Set up for home sleep study  Healthy sleep regimen  Do not drive if sleepy  Work on healthy weight loss  Follow up in 6 weeks and As needed

## 2023-03-31 NOTE — Progress Notes (Signed)
 @Patient  ID: Michael Whitney, male    DOB: 12-20-1950, 73 y.o.   MRN: [id number]  Chief Complaint  Patient presents with   Follow-up    Referring provider: Dorina Dallas RIGGERS  HPI: 73 year old male former smoker - pipe smoker seen for pulmonary consult March 20, 2021 for shortness of breath felt to be multifactorial with underlying pulmonary hypertension, aortic valve disease and heart failure with preserved EF Medical history significant for sleep apnea on CPAP, atrial fibrillation, diabetes  TEST/EVENTS :  2D echo October 2023 heavily calcified aortic valve with moderate aortic valve stenosis, EF 50 to 55%, right ventricular size normal, right ventricular systolic function normal  Right and left heart cath September 2022 nonobstructive coronary artery disease, minimally elevated left heart filling pressure, mild pulmonary hypertension and normal cardiac output.,  Diastolic heart failure  Chest x-ray November 17, 2022 clear lungs  PFTs April 23, 2021 moderate restriction with FEV1 at 62%, ratio 74, FVC 61%, mild bronchodilator response, DLCO 93%  03/31/2023 Follow up : Dyspnea , OSA  Patient returns for a follow-up visit.  Was last seen March 2023.  Patient been previously seen for shortness of breath felt to be multifactorial with underlying mild pulmonary hypertension, aortic valve disease .  He underwent pulmonary function testing that showed some moderate restriction.  Felt he could have a component of some mild COPD he was given a trial of Spiriva .  Since last visit patient says breathing is doing okay. Did not have any perceived benefit with Spiriva  . Has balance issues and chronic dizziness. Does weights and yoga 4 days a week.  He denies any significant shortness of breath currently.  Says he is mainly limited by balance and visual issues currently.  Patient is here to establish for sleep apnea.  Says he has been on BiPAP for greater than 10 years.  He does not remember who  started him on BiPAP.  He does not been managed in a long time.  Says he has not had any supplies in over the last 7 months.  He does not remember his DME company.  We have contacted the local DME but have been unable to find his DME.  Patient says he feels better on BiPAP.  But has been unable to wear for the last 2 months since the machine is not working.  He typically goes to bed about 9 PM.  Takes a couple minutes to go to sleep.  Gets up at 7 AM.  Weight is up about 40 pounds over the last 2 years current weight is at 290 pounds with a BMI at 36.  He has no history of congestive heart failure or stroke.  Takes a nap once daily for about 30 minutes.  Gets in 2 cups of coffee.  Epworth score is 5 out of 24.  Gets sleepy watch TV, in the evening hours and after eating lunch.  Has no symptoms suspicious for cataplexy or sleep paralysis.  He does have snoring and restless sleep since being off of his BiPAP.  Feels that he benefits from BiPAP.  Did have a sleep study but greater than 10 years ago and does not remember where.  BiPAP download via SD card shows previous usage in September with daily usage at 8 hours patient is on auto max 15 and EPAP 10 with AHI at 3.6 positive mask leaks.       Allergies  Allergen Reactions   Amlodipine  Other (See Comments)    Dizziness  dizziness    Immunization History  Administered Date(s) Administered   Fluad Quad(high Dose 65+) 11/29/2020, 12/23/2022   Fluzone Influenza virus vaccine,trivalent (IIV3), split virus 12/23/2018   Influenza Whole 12/23/2018   Influenza, High Dose Seasonal PF 11/08/2017, 10/23/2018   Influenza,inj,Quad PF,6+ Mos 01/23/2015, 11/20/2016   Influenza-Unspecified 01/23/2015, 11/26/2015, 11/26/2015, 11/20/2016, 10/23/2018   PFIZER(Purple Top)SARS-COV-2 Vaccination 04/01/2019, 04/26/2019   Pfizer Covid-19 Vaccine Bivalent Booster 35yrs & up 11/29/2020   Pneumococcal Conjugate-13 02/14/2016, 02/14/2016   Pneumococcal Polysaccharide-23  01/23/2015, 01/23/2015, 08/12/2018    Past Medical History:  Diagnosis Date   Atrial fibrillation (HCC)    Cataract    Diabetes mellitus without complication (HCC)    Hypertension    Seizures (HCC)    Thought to be related to something his GF gave him    Tobacco History: Social History   Tobacco Use  Smoking Status Never  Smokeless Tobacco Never   Counseling given: Not Answered   Outpatient Medications Prior to Visit  Medication Sig Dispense Refill   Accu-Chek FastClix Lancets MISC Use 1 lancet daily to test blood sugar (E11.9)     ammonium lactate  (LAC-HYDRIN ) 12 % lotion Apply 1 Application topically as needed for dry skin. 400 g 0   azelastine  (ASTELIN ) 0.1 % nasal spray Place 2 sprays into both nostrils 2 (two) times daily. Use in each nostril as directed 30 mL 0   cetirizine  (ZYRTEC ) 10 MG tablet Take 1 tablet (10 mg total) by mouth 2 (two) times daily. 60 tablet 3   Cholecalciferol (VITAMIN D -3) 25 MCG (1000 UT) CAPS Take 1,000 Units by mouth daily.     dapagliflozin  propanediol (FARXIGA ) 10 MG TABS tablet Take 1 tablet (10 mg total) by mouth daily before breakfast. 30 tablet 1   fluticasone  (FLONASE ) 50 MCG/ACT nasal spray Place 2 sprays into both nostrils daily. 16 g 1   levothyroxine  (SYNTHROID ) 50 MCG tablet TAKE 1 TABLET(50 MCG) BY MOUTH DAILY 90 tablet 3   losartan  (COZAAR ) 100 MG tablet Take 1 tablet (100 mg total) by mouth daily. 30 tablet 0   mometasone  (ELOCON ) 0.1 % cream Apply topically daily as needed. 45 g 1   Multiple Vitamin (MULTI-VITAMIN) tablet Take 1 tablet by mouth daily.     Omega-3 Fatty Acids (FISH OIL) 1000 MG CAPS Take 1,000 mg by mouth daily.     OVER THE COUNTER MEDICATION Take 6 drops by mouth daily. Vitamins A, D, and K     vitamin B-12 (CYANOCOBALAMIN ) 1000 MCG tablet Take 1,000 mcg by mouth daily.     Vitamin D , Ergocalciferol , (DRISDOL ) 1.25 MG (50000 UNIT) CAPS capsule Take 1 capsule (50,000 Units total) by mouth every 7 (seven) days. 5  capsule 12   XARELTO  20 MG TABS tablet TAKE 1 TABLET(20 MG) BY MOUTH DAILY WITH SUPPER 90 tablet 3   albuterol  (VENTOLIN  HFA) 108 (90 Base) MCG/ACT inhaler Inhale 2 puffs into the lungs every 6 (six) hours as needed for wheezing or shortness of breath. (Patient not taking: Reported on 03/31/2023) 18 g 5   benzonatate  (TESSALON ) 100 MG capsule Take 1 capsule (100 mg total) by mouth 3 (three) times daily as needed for cough. (Patient not taking: Reported on 03/31/2023) 30 capsule 0   famotidine  (PEPCID ) 20 MG tablet TAKE 1 TABLET(20 MG) BY MOUTH TWICE DAILY (Patient not taking: Reported on 03/31/2023) 60 tablet 3   hydrOXYzine  (VISTARIL ) 25 MG capsule TAKE 1 CAPSULE(25 MG) BY MOUTH AT BEDTIME AS NEEDED (Patient not taking: Reported on 03/31/2023)  30 capsule 0   JANUVIA  25 MG tablet TAKE 1 TABLET(25 MG) BY MOUTH DAILY (Patient not taking: Reported on 03/31/2023) 30 tablet 3   No facility-administered medications prior to visit.     Review of Systems:   Constitutional:   No  weight loss, night sweats,  Fevers, chills, +fatigue, or  lassitude.  HEENT:   No headaches,  Difficulty swallowing,  Tooth/dental problems, or  Sore throat,                No sneezing, itching, ear ache, nasal congestion, post nasal drip,   CV:  No chest pain,  Orthopnea, PND, swelling in lower extremities, anasarca, dizziness, palpitations, syncope.   GI  No heartburn, indigestion, abdominal pain, nausea, vomiting, diarrhea, change in bowel habits, loss of appetite, bloody stools.   Resp: Skin: no rash or lesions.  GU: no dysuria, change in color of urine, no urgency or frequency.  No flank pain, no hematuria   MS:  No joint pain or swelling.  No decreased range of motion.  No back pain.    Physical Exam  BP 128/62 (BP Location: Left Arm, Patient Position: Sitting)   Pulse (!) 48   Ht 6' 3 (1.905 m)   Wt 290 lb (131.5 kg)   SpO2 100%   BMI 36.25 kg/m   GEN: A/Ox3; pleasant , NAD, well nourished    HEENT:  [place]/AT,    NOSE-clear, THROAT-clear, no lesions, no postnasal drip or exudate noted.  Class III MP airway  NECK:  Supple w/ fair ROM; no JVD; normal carotid impulses w/o bruits; no thyromegaly or nodules palpated; no lymphadenopathy.    RESP  Clear  P & A; w/o, wheezes/ rales/ or rhonchi. no accessory muscle use, no dullness to percussion  CARD:  RRR, no m/r/g, tr  peripheral edema, pulses intact, no cyanosis or clubbing.  GI:   Soft & nt; nml bowel sounds; no organomegaly or masses detected.   Musco: Warm bil, no deformities or joint swelling noted.   Neuro: alert, no focal deficits noted.    Skin: Warm, no lesions or rashes    Lab Results:    BNP No results found for: BNP  ProBNP  Imaging: No results found.  Administration History     None          Latest Ref Rng & Units 04/23/2021   12:39 PM  PFT Results  FVC-Pre L 3.01   FVC-Predicted Pre % 57   FVC-Post L 3.22   FVC-Predicted Post % 61   Pre FEV1/FVC % % 72   Post FEV1/FCV % % 74   FEV1-Pre L 2.17   FEV1-Predicted Pre % 56   FEV1-Post L 2.40   DLCO uncorrected ml/min/mmHg 27.76   DLCO UNC% % 93   DLCO corrected ml/min/mmHg 26.63   DLCO COR %Predicted % 89   DLVA Predicted % 112   TLC L 6.74   TLC % Predicted % 83   RV % Predicted % 124     No results found for: NITRICOXIDE      Assessment & Plan:   COPD (chronic obstructive pulmonary disease) (HCC) PFTs in March 2023 showed some moderate restriction along with mild daily response could have a component of some mild COPD.  He is a former pipe smoker.  Had no perceived benefit with Spiriva .  Does not have significant dyspnea currently.  May use his albuterol  as needed suspect his shortness of breath is multifactorial.  Plan  Patient  Instructions  Set up for home sleep study  Healthy sleep regimen  Do not drive if sleepy  Work on healthy weight loss  Follow up in 6 weeks and As needed      OSA (obstructive sleep apnea) Patient has a  longstanding history of obstructive sleep apnea is on nocturnal BiPAP.  Unfortunately we do not have a copy of his previous sleep study for diagnostic purposes.  And is unable to find his associated DME company.  He does have his BiPAP machine with him but unfortunately does not have any information of what company he has been using.  Will set patient up for home sleep study.  Once results are available then we will decide on next step whether to start CPAP or set up for titration study.  With no history of congestive heart failure, stroke or chronic respiratory failure insurance typically will not cover an in lab split-night study which is needed  Plan  Patient Instructions  Set up for home sleep study  Healthy sleep regimen  Do not drive if sleepy  Work on healthy weight loss  Follow up in 6 weeks and As needed        Madelin Stank, NP 03/31/2023

## 2023-03-31 NOTE — Patient Instructions (Signed)
 Set up for home sleep study  Healthy sleep regimen  Do not drive if sleepy  Work on healthy weight loss  Follow up in 6 weeks and As needed

## 2023-03-31 NOTE — Assessment & Plan Note (Signed)
 Patient has a longstanding history of obstructive sleep apnea is on nocturnal BiPAP.  Unfortunately we do not have a copy of his previous sleep study for diagnostic purposes.  And is unable to find his associated DME company.  He does have his BiPAP machine with him but unfortunately does not have any information of what company he has been using.  Will set patient up for home sleep study.  Once results are available then we will decide on next step whether to start CPAP or set up for titration study.  With no history of congestive heart failure, stroke or chronic respiratory failure insurance typically will not cover an in lab split-night study which is needed  Plan  Patient Instructions  Set up for home sleep study  Healthy sleep regimen  Do not drive if sleepy  Work on healthy weight loss  Follow up in 6 weeks and As needed

## 2023-04-05 DIAGNOSIS — E0500 Thyrotoxicosis with diffuse goiter without thyrotoxic crisis or storm: Secondary | ICD-10-CM | POA: Diagnosis not present

## 2023-04-05 DIAGNOSIS — M6289 Other specified disorders of muscle: Secondary | ICD-10-CM | POA: Diagnosis not present

## 2023-04-06 ENCOUNTER — Other Ambulatory Visit: Payer: Self-pay | Admitting: Medical

## 2023-04-11 ENCOUNTER — Other Ambulatory Visit: Payer: Self-pay | Admitting: Medical

## 2023-05-04 ENCOUNTER — Other Ambulatory Visit: Payer: Self-pay | Admitting: Medical

## 2023-05-18 DIAGNOSIS — E0500 Thyrotoxicosis with diffuse goiter without thyrotoxic crisis or storm: Secondary | ICD-10-CM | POA: Diagnosis not present

## 2023-05-18 DIAGNOSIS — Z961 Presence of intraocular lens: Secondary | ICD-10-CM | POA: Diagnosis not present

## 2023-05-18 DIAGNOSIS — R42 Dizziness and giddiness: Secondary | ICD-10-CM | POA: Diagnosis not present

## 2023-05-18 DIAGNOSIS — H3581 Retinal edema: Secondary | ICD-10-CM | POA: Diagnosis not present

## 2023-05-24 ENCOUNTER — Encounter: Payer: Self-pay | Admitting: Adult Health

## 2023-05-24 ENCOUNTER — Ambulatory Visit: Payer: PPO | Admitting: Adult Health

## 2023-05-24 ENCOUNTER — Telehealth: Payer: Self-pay

## 2023-05-24 ENCOUNTER — Telehealth (INDEPENDENT_AMBULATORY_CARE_PROVIDER_SITE_OTHER): Payer: PPO | Admitting: Adult Health

## 2023-05-24 VITALS — Ht 75.0 in | Wt 280.0 lb

## 2023-05-24 DIAGNOSIS — G4733 Obstructive sleep apnea (adult) (pediatric): Secondary | ICD-10-CM

## 2023-05-24 NOTE — Progress Notes (Unsigned)
 Virtual Visit via Video Note  I connected with Michael Whitney on 05/24/23 at  3:00 PM EDT by a video enabled telemedicine application and verified that I am speaking with the correct person using two identifiers.  Location: Patient: Home  Provider: Office    I discussed the limitations of evaluation and management by telemedicine and the availability of in person appointments. The patient expressed understanding and agreed to proceed.  History of Present Illness: 73 year old male former smoker-pipe smoker seen for pulmonary consult March 20, 2021 for shortness of breath felt to be multifactorial with underlying pulmonary hypertension, aortic valve disease and heart failure with preserved EF, possible COPD with restriction on PFTs. (No perceived benefit on Spiriva )  Medical history significant for sleep apnea on CPAP, atrial fibrillation and diabetes  Today's video visit is a 6-week follow-up.  Patient has obstructive sleep apnea has been on BiPAP for greater than 10 years.  Last visit needed new BiPAP and supplies.  Unfortunately was unable to get previous sleep study results or DME information.  He was set up for home sleep study.Results are pending.  BiPAP download last month showed excellent compliance with daily average usage at 8 hours patient is on auto BiPAP max 15 and EPAP minimum 10 with AHI at 3.6/hour.  Says breathing is doing okay overall. No flare in cough or wheezing. No increased dyspnea. No increased albuterol use.   Past Medical History:  Diagnosis Date   Atrial fibrillation (HCC)    Cataract    Diabetes mellitus without complication (HCC)    Hypertension    Seizures (HCC)    Thought to be related to something his GF gave him   Current Outpatient Medications on File Prior to Visit  Medication Sig Dispense Refill   Accu-Chek FastClix Lancets MISC Use 1 lancet daily to test blood sugar (E11.9)     ammonium lactate (LAC-HYDRIN) 12 % lotion Apply 1 Application topically  as needed for dry skin. 400 g 0   azelastine (ASTELIN) 0.1 % nasal spray Place 2 sprays into both nostrils 2 (two) times daily. Use in each nostril as directed 30 mL 0   cetirizine (ZYRTEC) 10 MG tablet Take 1 tablet (10 mg total) by mouth 2 (two) times daily. 60 tablet 3   Cholecalciferol (VITAMIN D-3) 25 MCG (1000 UT) CAPS Take 1,000 Units by mouth daily.     dapagliflozin propanediol (FARXIGA) 10 MG TABS tablet Take 1 tablet (10 mg total) by mouth daily before breakfast. 30 tablet 1   fluticasone (FLONASE) 50 MCG/ACT nasal spray SHAKE LIQUID AND USE 2 SPRAYS IN EACH NOSTRIL DAILY 16 g 1   levothyroxine (SYNTHROID) 50 MCG tablet TAKE 1 TABLET(50 MCG) BY MOUTH DAILY 90 tablet 3   losartan (COZAAR) 100 MG tablet Take 1 tablet (100 mg total) by mouth daily. 30 tablet 0   mometasone (ELOCON) 0.1 % cream Apply topically daily as needed. 45 g 1   Multiple Vitamin (MULTI-VITAMIN) tablet Take 1 tablet by mouth daily.     Omega-3 Fatty Acids (FISH OIL) 1000 MG CAPS Take 1,000 mg by mouth daily.     OVER THE COUNTER MEDICATION Take 6 drops by mouth daily. Vitamins A, D, and K     vitamin B-12 (CYANOCOBALAMIN) 1000 MCG tablet Take 1,000 mcg by mouth daily.     Vitamin D, Ergocalciferol, (DRISDOL) 1.25 MG (50000 UNIT) CAPS capsule Take 1 capsule (50,000 Units total) by mouth every 7 (seven) days. 5 capsule 12   XARELTO  20 MG TABS tablet TAKE 1 TABLET(20 MG) BY MOUTH DAILY WITH SUPPER 90 tablet 3   albuterol (VENTOLIN HFA) 108 (90 Base) MCG/ACT inhaler Inhale 2 puffs into the lungs every 6 (six) hours as needed for wheezing or shortness of breath. (Patient not taking: Reported on 05/24/2023) 18 g 5   benzonatate (TESSALON) 100 MG capsule Take 1 capsule (100 mg total) by mouth 3 (three) times daily as needed for cough. (Patient not taking: Reported on 05/24/2023) 30 capsule 0   famotidine (PEPCID) 20 MG tablet TAKE 1 TABLET(20 MG) BY MOUTH TWICE DAILY (Patient not taking: Reported on 05/24/2023) 60 tablet 3    hydrOXYzine (VISTARIL) 25 MG capsule TAKE 1 CAPSULE(25 MG) BY MOUTH AT BEDTIME AS NEEDED (Patient not taking: Reported on 05/24/2023) 30 capsule 0   JANUVIA 25 MG tablet TAKE 1 TABLET(25 MG) BY MOUTH DAILY (Patient not taking: Reported on 05/24/2023) 30 tablet 3   No current facility-administered medications on file prior to visit.      Observations/Objective: 2D echo October 2023 heavily calcified aortic valve with moderate aortic valve stenosis, EF 50 to 55%, right ventricular size normal, right ventricular systolic function normal   Right and left heart cath September 2022 nonobstructive coronary artery disease, minimally elevated left heart filling pressure, mild pulmonary hypertension and normal cardiac output.,  Diastolic heart failure   Chest x-ray November 17, 2022 clear lungs   PFTs April 23, 2021 moderate restriction with FEV1 at 62%, ratio 74, FVC 61%, mild bronchodilator response, DLCO 93%  05/24/2023 -appears in NAD  Assessment and Plan: OSA excellent control compliance on nocturnal CPAP.  Diagnostic home sleep study shows Order for new BiPAP and supplies continue current settings  Plan  Patient Instructions  Continue on BIPAP At bedtime, wear all night long  Healthy sleep regimen  Do not drive if sleepy  Work on healthy weight loss  Follow up in 3 months and As needed       Follow Up Instructions:    I discussed the assessment and treatment plan with the patient. The patient was provided an opportunity to ask questions and all were answered. The patient agreed with the plan and demonstrated an understanding of the instructions.   The patient was advised to call back or seek an in-person evaluation if the symptoms worsen or if the condition fails to improve as anticipated.  I provided 22  minutes of non-face-to-face time during this encounter.   Rubye Oaks, NP

## 2023-05-24 NOTE — Telephone Encounter (Signed)
 Patient scheduled for mychart visit today to discuss sleep study results. Results are not available via epic.  I have spoken to [place], who checked the [facility] system and it shows that device has been mailed to patient but not returned. Patient stated that he mailed the device back on 05/04/23. Jeanice Lim will reach out to [place] w/[employer] for an update.

## 2023-05-24 NOTE — Patient Instructions (Addendum)
 Continue on BIPAP At bedtime, wear all night long  Healthy sleep regimen  Do not drive if sleepy  Work on healthy weight loss  Follow up in 3 months and As needed

## 2023-05-26 NOTE — Telephone Encounter (Signed)
 Received below message form Lanna Poche via epic secure chat: I spoke to [place]. The pt doesn't remember placing the return label on the box so it would be mailed back to the patient. [employer] is trying to track down using tracking number with [employer] & will be reaching out to the patient again today. They will provide an update to [place] as soon as they know what is going on.

## 2023-05-31 NOTE — Telephone Encounter (Signed)
 Michael Whitney, is there an update on this?

## 2023-06-02 NOTE — Telephone Encounter (Signed)
 Per Victorino Dike with [employer] (not [place]), they are still waiting for this equipment to be returned.  Pt was to check with his local Mail carrier for follow up.

## 2023-06-03 NOTE — Telephone Encounter (Signed)
 He will most likely need to repeat study as it appears it is lost, can we reach back out to [place] with [facility]

## 2023-06-09 NOTE — Telephone Encounter (Signed)
 Any update, if not received- do I need to order new study ?

## 2023-06-09 NOTE — Telephone Encounter (Signed)
ATC Jennifer LMTCB 

## 2023-06-09 NOTE — Telephone Encounter (Signed)
 Michael Whitney has the # for [place] already. Nothing further needed on our side.

## 2023-06-09 NOTE — Telephone Encounter (Signed)
 I can call Michael Whitney with [employer]. I sent the PCC's a message requesting her number so I could look into this

## 2023-06-10 ENCOUNTER — Other Ambulatory Visit: Payer: Self-pay | Admitting: Medical

## 2023-06-10 ENCOUNTER — Other Ambulatory Visit (HOSPITAL_BASED_OUTPATIENT_CLINIC_OR_DEPARTMENT_OTHER): Payer: Self-pay

## 2023-06-10 DIAGNOSIS — G4733 Obstructive sleep apnea (adult) (pediatric): Secondary | ICD-10-CM

## 2023-06-10 NOTE — Telephone Encounter (Signed)
 Order placed can we please insure patient is not charged a second time.

## 2023-06-10 NOTE — Telephone Encounter (Signed)
 Per Tammy can we just do repeat study do we need to place new order?

## 2023-06-10 NOTE — Telephone Encounter (Signed)
 Jennifer from [employer] came by our office and informed us  of the patient's device. [facility] received the tracking number and it's been awaiting for package since March 3rd. Patient needs a new repeat study due to lost packaging.

## 2023-06-10 NOTE — Telephone Encounter (Addendum)
 He won't be charged a Second time. Got confirmation from [place]gan from [employer].

## 2023-06-10 NOTE — Telephone Encounter (Signed)
 If the machine was not received back , we can assume it is lost, can we repeat study

## 2023-06-20 ENCOUNTER — Encounter

## 2023-06-20 DIAGNOSIS — G4733 Obstructive sleep apnea (adult) (pediatric): Secondary | ICD-10-CM

## 2023-06-20 DIAGNOSIS — G4730 Sleep apnea, unspecified: Secondary | ICD-10-CM | POA: Diagnosis not present

## 2023-06-28 NOTE — Telephone Encounter (Signed)
 We received a call. Michael Whitney has successfully completed his Home Sleep Test with [employer] and it just needs to be signed and we will upload it after.

## 2023-06-30 DIAGNOSIS — H5021 Vertical strabismus, right eye: Secondary | ICD-10-CM | POA: Diagnosis not present

## 2023-06-30 DIAGNOSIS — H5010 Unspecified exotropia: Secondary | ICD-10-CM | POA: Diagnosis not present

## 2023-06-30 DIAGNOSIS — R2689 Other abnormalities of gait and mobility: Secondary | ICD-10-CM | POA: Diagnosis not present

## 2023-06-30 DIAGNOSIS — E0500 Thyrotoxicosis with diffuse goiter without thyrotoxic crisis or storm: Secondary | ICD-10-CM | POA: Diagnosis not present

## 2023-06-30 DIAGNOSIS — H50111 Monocular exotropia, right eye: Secondary | ICD-10-CM | POA: Diagnosis not present

## 2023-07-01 NOTE — Telephone Encounter (Signed)
 Dr Linder Revere,  have you received this sleep study for review?  Thank you!

## 2023-07-06 DIAGNOSIS — G4733 Obstructive sleep apnea (adult) (pediatric): Secondary | ICD-10-CM | POA: Diagnosis not present

## 2023-07-06 NOTE — Telephone Encounter (Signed)
 Dr. Linder Revere, just a reminder, have you reviewed this patients sleep study from [facility]?  Thank you.

## 2023-07-07 NOTE — Telephone Encounter (Signed)
 Called patient to inform that the HST report is now uploaded into EPIC.  Patient would like result information.  Dr. Linder Revere, please advise.

## 2023-07-09 NOTE — Telephone Encounter (Signed)
 Can we set him up or 3 months follow-up per [facility]

## 2023-07-09 NOTE — Telephone Encounter (Signed)
 Home sleep test showed moderate obstructive sleep apnea, averaging 25.3 apneas/ hour with drops in blood oxygen level. It looks as if Michael Whitney was waiting for this to order him a replacement BIPAP machine.

## 2023-07-09 NOTE — Addendum Note (Signed)
 Addended by: Drema Genta on: 07/09/2023 02:49 PM   Modules accepted: Orders

## 2023-07-09 NOTE — Telephone Encounter (Signed)
 Order for BIPAP sent to DME 15/10-order sent  Please set ov in 3 months for follow up.

## 2023-07-13 ENCOUNTER — Ambulatory Visit: Payer: Self-pay | Admitting: Adult Health

## 2023-07-14 ENCOUNTER — Other Ambulatory Visit: Payer: Self-pay | Admitting: Medical

## 2023-07-15 ENCOUNTER — Telehealth: Payer: Self-pay

## 2023-07-15 ENCOUNTER — Ambulatory Visit: Payer: Self-pay

## 2023-07-15 MED ORDER — CETIRIZINE HCL 10 MG PO TABS: 10.0000 mg | ORAL_TABLET | Freq: Two times a day (BID) | ORAL | 3 refills | Status: AC

## 2023-07-15 NOTE — Telephone Encounter (Signed)
 Rx sent  Copied from CRM [id number]. Topic: Clinical - Prescription Issue >> Jul 15, 2023 11:29 AM Danae Duncans wrote: Reason for CRM: PT called and advise [facility] state they could not refill the prescription cetirizine  (ZYRTEC ) 10 MG tablet as the prescriber who wrote it is not caring for him, so pt would like medication fill under PCP Dr. Saguier

## 2023-07-15 NOTE — Telephone Encounter (Signed)
  Chief Complaint: dizziness Symptoms: Fatigue, lightheadedness Frequency: several months Pertinent Negatives: Patient denies CP, SOB Disposition: [] ED /[] Urgent Care (no appt availability in office) / [] Appointment(In office/virtual)/ []  [facility]/ [] Home Care/ [] Refused Recommended Disposition /[] [facility]/ [x]  Follow-up with PCP Additional Notes: patient calling to report continued  symptoms of dizziness, lightheadedness and fatigue. Patient states he has been seen by different specialists for the continued dizziness. Patient reports being frustrated with not having any answers from the different specialists. Patient is due to see a neurologist in the near future. Patient is wanting to set up an appointment with PCP to be reevaluated. Per protocol, patient is recommended to be seen within three days. Patient only want to see PCP. Patient is asking for a phone call from office about scheduling. Patient verbalized understanding and all questions answered.    Copied from CRM [id number]. Topic: Clinical - Red Word Triage >> Jul 15, 2023 11:36 AM [name] wrote: Red Word that prompted transfer to Nurse Triage: Dizziness Reason for Disposition  [1] MODERATE dizziness (e.g., interferes with normal activities) AND [2] has been evaluated by doctor (or NP/PA) for this  Answer Assessment - Initial Assessment Questions 1. DESCRIPTION: "Describe your dizziness."     "Feels off that he might pass out" 2. LIGHTHEADED: "Do you feel lightheaded?" (e.g., somewhat faint, woozy, weak upon standing)     Yes 3. VERTIGO: "Do you feel like either you or the room is spinning or tilting?" (i.e. vertigo)     No 4. SEVERITY: "How bad is it?"  "Do you feel like you are going to faint?" "Can you stand and walk?"   - MILD: Feels slightly dizzy, but walking normally.   - MODERATE: Feels unsteady when walking, but not falling; interferes with normal activities (e.g., school, work).   - SEVERE:  Unable to walk without falling, or requires assistance to walk without falling; feels like passing out now.      Moderate 5. ONSET:  "When did the dizziness begin?"     Patient states symptoms have been going on for months but patient feels like the symptoms have gotten worse.  6. AGGRAVATING FACTORS: "Does anything make it worse?" (e.g., standing, change in head position)     Change in head position 7. HEART RATE: "Can you tell me your heart rate?" "How many beats in 15 seconds?"  (Note: not all patients can do this)       N/A 8. CAUSE: "What do you think is causing the dizziness?"     Unsure-patient has been having symptoms for quite awhile 9. RECURRENT SYMPTOM: "Have you had dizziness before?" If Yes, ask: "When was the last time?" "What happened that time?"     yes 10. OTHER SYMPTOMS: "Do you have any other symptoms?" (e.g., fever, chest pain, vomiting, diarrhea, bleeding)       Double vision  Protocols used: Dizziness - Lightheadedness-A-AH

## 2023-07-16 ENCOUNTER — Telehealth: Payer: Self-pay

## 2023-07-16 NOTE — Telephone Encounter (Signed)
 Copied from CRM [id number]. Topic: General - Call Back - No Documentation >> Jul 16, 2023  4:01 PM Freya Jesus wrote: Reason for CRM: Patient called returning call from [facility]. Tried to call CAL was unsuccessful. Per notes scheduled an appointment.

## 2023-07-16 NOTE — Telephone Encounter (Signed)
 Michael Whitney is PCP , the other " PCP " listed is endo

## 2023-07-16 NOTE — Telephone Encounter (Signed)
 Looks like another pcp is coming up. When you call him back will u confirm w him if we are pcp? Ins will not allow two pcp's.

## 2023-07-16 NOTE — Telephone Encounter (Signed)
 Pt called to check status and add for same day appt , no answer

## 2023-07-21 NOTE — Telephone Encounter (Signed)
 Patient has OV on 10/13/2023 at 11:00 am with Tammy for 3 months follow up.

## 2023-07-26 ENCOUNTER — Ambulatory Visit: Admitting: Medical

## 2023-07-28 ENCOUNTER — Encounter: Payer: Self-pay | Admitting: Medical

## 2023-07-28 ENCOUNTER — Telehealth (INDEPENDENT_AMBULATORY_CARE_PROVIDER_SITE_OTHER): Admitting: Medical

## 2023-07-28 VITALS — BP 105/73 | HR 56

## 2023-07-28 DIAGNOSIS — E039 Hypothyroidism, unspecified: Secondary | ICD-10-CM | POA: Diagnosis not present

## 2023-07-28 DIAGNOSIS — E118 Type 2 diabetes mellitus with unspecified complications: Secondary | ICD-10-CM

## 2023-07-28 DIAGNOSIS — R42 Dizziness and giddiness: Secondary | ICD-10-CM | POA: Diagnosis not present

## 2023-07-28 DIAGNOSIS — J302 Other seasonal allergic rhinitis: Secondary | ICD-10-CM | POA: Diagnosis not present

## 2023-07-28 DIAGNOSIS — E063 Autoimmune thyroiditis: Secondary | ICD-10-CM | POA: Diagnosis not present

## 2023-07-28 DIAGNOSIS — R5383 Other fatigue: Secondary | ICD-10-CM | POA: Diagnosis not present

## 2023-07-28 DIAGNOSIS — H532 Diplopia: Secondary | ICD-10-CM

## 2023-07-28 NOTE — Progress Notes (Signed)
 Virtual Visit via Video Note  I connected with Michael Whitney on 07/28/23 at  2:00 PM EDT by a video enabled telemedicine application and verified that I am speaking with the correct person using two identifiers.  Location: Patient: home [place] Provider: office [place]  Pt advised yesterday need to be in office appt for his cc dizziness. Despite this he scheduled video visit.   I discussed the limitations of evaluation and management by telemedicine and the availability of in person appointments. The patient expressed understanding and agreed to proceed.  History of Present Illness: Discussed the use of AI scribe software for clinical note transcription with the patient, who gave verbal consent to proceed.  History of Present Illness         Pt had dizziness for years for years.   Prior summary notes in "  "Pt has been seen for intermittent dizziness for years.    Pt has been seen by neurologist, PT, ophthalmologist, cardiologist, ENT, cardiologist and endocrinologist. No definitive cause has been found for dizziness.    Pt thought hypothyroid may have been cause. But thyroid  value normal. Pt was referred to Dr. Rosalea Collin. Pt saw her but he did not follow up for some time. Last A1c was 7.2. pt still on januvia .    Pt has been seen by MD for thyroid  eye disease. The hope was if thyorid eye disease treated this may have his vision and maybe help with dizziness. Pt has had 4 tepezza  injections and has not helped. MD at [facility] thinks he may need repeat mri. Also he suggested [facility] to [facility].   Neurologist A/P   Assessment and Plan:   Dizziness - unclear etiology but given unremarkable vestibular testing, consider Persistent Postural-Perceptual Dizziness (typically this is treated with an SSRI) or secondary to other systemic disease.  I do not think that the vertebral artery stenosis is source for his dizziness or diplopia.  Dizziness has been ongoing well before the vertebral artery  stenosis. Diplopia - most likely related to thyroid  disease.  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 This is chronic for years.   He experiences worsening dizziness and double vision, particularly during activities like yoga or weight lifting. The dizziness is more pronounced on sunny days and less severe at night. No vertigo, headaches, or vomiting associated with the dizziness. He has taken precautions by placing a chair nearby during exercises to prevent falls.  He has a history of thyroid  eye disease, previously treated with Tepezza , which improved his dizziness and double vision. However, he is no longer on the medication due to insurance coverage issues. He has been evaluated by neurologists, ophthalmologists, and cardiologists, but no definitive cause for his symptoms has been identified. 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  He reports chronic fatigue, which he attributes to his atrial fibrillation and possibly his thyroid  condition. He monitors his blood pressure and pulse regularly, noting recent readings of 105/73 mmHg with a pulse of 56 bpm. His blood sugar levels have been stable, averaging between 110 and 140 mg/dL. He is currently on medications including Farxiga , Januvia , for diabetes  and losartan  for htn.  He experiences symptoms of allergies, for which he takes Zyrtec  and Flonase . Antihistamines have helped with his dizziness. No loss  of strength in extremities or slurred speech.    Observations/Objective: General-no acute distress, pleasant, oriented. Lungs- on inspection lungs appear unlabored. Neck- no tracheal deviation or jvd on inspection. Neuro- gross motor function appears intact.    Assessment and Plan: Assessment and  Plan  Patient Instructions  Dizziness Chronic dizziness with diplopia and weakness. 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 Symptoms worsened during physical activities. Differential includes thyroid  eye disease, myasthenia gravis, and vertebral artery insufficiency. - Refer to neuro-ophthalmologist on August 16, 2023. - Request review of 2023 CT and MRI imaging studies. - Discuss myasthenia gravis, thyroid  eye disease and vertebral artery insufficiency with neuro-ophthalmologist. - Consider re-evaluation of Tepezza  treatment for thyroid  eye disease. - Ensure review of October 2022 CT angiography studies. Have specialist review  Thyroid  Eye Disease Thyroid  eye disease with previous Tepezza  treatment discontinued due to insurance issues. Symptoms improved during treatment. - Discuss Tepezza  treatment benefits with neuro-ophthalmologist. - Repeat thyroid  function tests if not done by specialist. - Consider specialist intervention for insurance negotiation.  Atrial Fibrillation Atrial fibrillation with current rate control. Recent blood pressure and pulse normal. Reports fatigue possibly related to atrial fibrillation. - Continue cardiologist follow-up. - Monitor blood pressure and pulse regularly.  Fatigue Chronic fatigue potentially related to hypothyroidism, atrial fibrillation, or Hashimoto's disease. Recent thyroid  function tests indicated hypothyroid range. - Order CBC, metabolic panel, TSH, T4, B12, and A1c tests. - Schedule blood tests by end of week.  Diabetes Mellitus Blood glucose levels well-managed with Farxiga  and Januvia . Blood glucose today was 131 mg/dL. - Order A1c test for long-term glucose control.  Allergic Rhinitis Symptoms improved with Zyrtec  and Flonase . Dizziness improved with antihistamine use. - Continue Zyrtec  and Flonase  as needed.  Follow-up Follow-up plans to ensure  continuity of care and further evaluation of symptoms. - Schedule in-person follow-up on August 23, 2023, post neuro-ophthalmologist visit. - Ensure neuro-ophthalmologist sends summary note to primary care. - Schedule blood tests for CBC, metabolic panel, TSH, T4, B12, and A1c by end of week.   Sylvia Everts, PA-C   Follow Up Instructions:    I discussed the assessment and treatment plan with the patient. The patient was provided an opportunity to ask questions and all were answered. The patient agreed with the plan and demonstrated an understanding of the instructions.   The patient was advised to call back or seek an in-person evaluation if the symptoms worsen or if the condition fails to improve as anticipated.   Ameerah Huffstetler, PA-C  Time spent with patient today was 43  minutes which consisted of chart review, discussing diagnosis, work up treatment and documentation.

## 2023-07-28 NOTE — Patient Instructions (Signed)
 Dizziness Chronic dizziness with diplopia and weakness. 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 Symptoms worsened during physical activities. Differential includes thyroid  eye disease, myasthenia gravis, and vertebral artery insufficiency. - Refer to neuro-ophthalmologist on August 16, 2023. - Request review of 2023 CT and MRI imaging studies. - Discuss myasthenia gravis, thyroid  eye disease and vertebral artery insufficiency with neuro-ophthalmologist. - Consider re-evaluation of Tepezza  treatment for thyroid  eye disease. - Ensure review of October 2022 CT angiography studies. Have specialist review  Thyroid  Eye Disease Thyroid  eye disease with previous Tepezza  treatment discontinued due to insurance issues. Symptoms improved during treatment. - Discuss Tepezza  treatment benefits with neuro-ophthalmologist. - Repeat thyroid  function tests if not done by specialist. - Consider specialist intervention for insurance negotiation.  Atrial Fibrillation Atrial fibrillation with current rate control. Recent blood pressure and pulse normal. Reports fatigue possibly related to atrial fibrillation. - Continue cardiologist follow-up. - Monitor blood pressure and pulse regularly.  Fatigue Chronic fatigue potentially related to hypothyroidism, atrial fibrillation, or Hashimoto's disease. Recent thyroid  function tests indicated hypothyroid range. - Order CBC, metabolic panel, TSH, T4, B12, and A1c tests. - Schedule blood tests by end of week.  Diabetes Mellitus Blood glucose levels well-managed with Farxiga  and Januvia . Blood glucose today was 131 mg/dL. - Order A1c test for long-term glucose control.  Allergic Rhinitis Symptoms improved with Zyrtec  and Flonase . Dizziness improved with antihistamine use. - Continue Zyrtec  and Flonase  as needed.  Follow-up Follow-up plans to ensure continuity of care and further  evaluation of symptoms. - Schedule in-person follow-up on August 23, 2023, post neuro-ophthalmologist visit. - Ensure neuro-ophthalmologist sends summary note to primary care. - Schedule blood tests for CBC, metabolic panel, TSH, T4, B12, and A1c by end of week.

## 2023-07-29 ENCOUNTER — Telehealth: Payer: Self-pay

## 2023-07-29 DIAGNOSIS — J449 Chronic obstructive pulmonary disease, unspecified: Secondary | ICD-10-CM

## 2023-07-29 DIAGNOSIS — G4733 Obstructive sleep apnea (adult) (pediatric): Secondary | ICD-10-CM

## 2023-07-29 NOTE — Telephone Encounter (Signed)
 Copied from CRM [id number]. Topic: Clinical - Lab/Test Results >> Jul 29, 2023 11:04 AM Michael Whitney wrote: Reason for CRM: Patient would like to discuss his sleep study results -SNAP advised pt to contact provider.   I called and spoke to pt. Pt informed of his HST results and the Bipap order was placed in May 2025. Pt states he never received his machine. I will route to the pcc's to see what the issue is with the order.

## 2023-08-02 ENCOUNTER — Other Ambulatory Visit

## 2023-08-02 ENCOUNTER — Other Ambulatory Visit (INDEPENDENT_AMBULATORY_CARE_PROVIDER_SITE_OTHER)

## 2023-08-02 DIAGNOSIS — R5383 Other fatigue: Secondary | ICD-10-CM | POA: Diagnosis not present

## 2023-08-02 DIAGNOSIS — E118 Type 2 diabetes mellitus with unspecified complications: Secondary | ICD-10-CM

## 2023-08-02 NOTE — Addendum Note (Signed)
 Addended by: Marigene Shoulder on: 08/02/2023 01:22 PM   Modules accepted: Orders

## 2023-08-03 LAB — COMPREHENSIVE METABOLIC PANEL WITH GFR
AG Ratio: 2.1 (calc) (ref 1.0–2.5)
ALT: 17 U/L (ref 9–46)
AST: 18 U/L (ref 10–35)
Albumin: 4.6 g/dL (ref 3.6–5.1)
Alkaline phosphatase (APISO): 46 U/L (ref 35–144)
BUN/Creatinine Ratio: 18 (calc) (ref 6–22)
BUN: 23 mg/dL (ref 7–25)
CO2: 20 mmol/L (ref 20–32)
Calcium: 9.1 mg/dL (ref 8.6–10.3)
Chloride: 103 mmol/L (ref 98–110)
Creat: 1.29 mg/dL — ABNORMAL HIGH (ref 0.70–1.28)
Globulin: 2.2 g/dL (ref 1.9–3.7)
Glucose, Bld: 143 mg/dL — ABNORMAL HIGH (ref 65–99)
Potassium: 4.7 mmol/L (ref 3.5–5.3)
Sodium: 139 mmol/L (ref 135–146)
Total Bilirubin: 1.2 mg/dL (ref 0.2–1.2)
Total Protein: 6.8 g/dL (ref 6.1–8.1)
eGFR: 58 mL/min/{1.73_m2} — ABNORMAL LOW (ref >=60)

## 2023-08-03 LAB — CBC WITH DIFFERENTIAL/PLATELET
Absolute Lymphocytes: 1976 {cells}/uL (ref 850–3900)
Absolute Monocytes: 710 {cells}/uL (ref 200–950)
Basophils Absolute: 67 {cells}/uL (ref 0–200)
Basophils Relative: 0.9 %
Eosinophils Absolute: 89 {cells}/uL (ref 15–500)
Eosinophils Relative: 1.2 %
HCT: 55 % — ABNORMAL HIGH (ref 38.5–50.0)
Hemoglobin: 17.8 g/dL — ABNORMAL HIGH (ref 13.2–17.1)
MCH: 32.1 pg (ref 27.0–33.0)
MCHC: 32.4 g/dL (ref 32.0–36.0)
MCV: 99.1 fL (ref 80.0–100.0)
MPV: 10.8 fL (ref 7.5–12.5)
Monocytes Relative: 9.6 %
Neutro Abs: 4558 {cells}/uL (ref 1500–7800)
Neutrophils Relative %: 61.6 %
Platelets: 141 10*3/uL (ref 140–400)
RBC: 5.55 10*6/uL (ref 4.20–5.80)
RDW: 14.5 % (ref 11.0–15.0)
Total Lymphocyte: 26.7 %
WBC: 7.4 10*3/uL (ref 3.8–10.8)

## 2023-08-03 LAB — HEMOGLOBIN A1C
Hgb A1c MFr Bld: 7.1 % — ABNORMAL HIGH (ref <5.7)
Mean Plasma Glucose: 157 mg/dL
eAG (mmol/L): 8.7 mmol/L

## 2023-08-03 LAB — TSH: TSH: 4.69 m[IU]/L — ABNORMAL HIGH (ref 0.40–4.50)

## 2023-08-03 LAB — VITAMIN B12: Vitamin B-12: 367 pg/mL (ref 200–1100)

## 2023-08-03 LAB — T4, FREE: Free T4: 1 ng/dL (ref 0.8–1.8)

## 2023-08-03 NOTE — Telephone Encounter (Signed)
 I have messaged the DME company to check on this matter.

## 2023-08-03 NOTE — Telephone Encounter (Signed)
 Per Ammon Bales at [facility]- We are still processing this order. We need the titration showing CPAP failed. The notes show that the patient has been using a BiPAP but the sleep study on file does not show a BiPAP was used/still needed. We would need that to get insurance approval.

## 2023-08-04 ENCOUNTER — Ambulatory Visit: Payer: Self-pay | Admitting: Medical

## 2023-08-11 NOTE — Telephone Encounter (Signed)
 Can we check with DME, He has been on BIPAP for >10 yrs.  We do not have copy of sleep study so did HST that showed Dx OSA -Moderate  This was done to re-establish the Dx of OSA .  The BIPAP download is in my notes with pressure setting and AHI .  So please ask is this not adequate , if not he will need to go for titration study .

## 2023-08-11 NOTE — Telephone Encounter (Signed)
 Michael Whitney, can you review the HST results.

## 2023-08-11 NOTE — Telephone Encounter (Signed)
 I have sent this information to the DME company and I will let you know as soon as I hear.

## 2023-08-11 NOTE — Telephone Encounter (Signed)
 Per Loyce Ruffini at [facility]-  Hi Mary-Hannah, unfortunately his [employer] requires and authorization and we have to submit the Titration that shows failed CPAP started on BIPAP without that they will deny the auth.  If the original testing is not available he will need to have a new Titration that shows he failed CPAP.

## 2023-08-12 NOTE — Telephone Encounter (Signed)
 Please call patient, Insurance is denying BIPAP says has to have CPAP titration that says he fails CPAP . We are very sorry but insurance /DME will not cover.  Set up for CPAP titration.

## 2023-08-12 NOTE — Addendum Note (Signed)
 Addended by: Kary Pages on: 08/12/2023 03:25 PM   Modules accepted: Orders

## 2023-08-16 DIAGNOSIS — M4802 Spinal stenosis, cervical region: Secondary | ICD-10-CM | POA: Diagnosis not present

## 2023-08-16 DIAGNOSIS — E785 Hyperlipidemia, unspecified: Secondary | ICD-10-CM | POA: Diagnosis not present

## 2023-08-16 DIAGNOSIS — H8113 Benign paroxysmal vertigo, bilateral: Secondary | ICD-10-CM | POA: Diagnosis not present

## 2023-08-16 DIAGNOSIS — G4733 Obstructive sleep apnea (adult) (pediatric): Secondary | ICD-10-CM | POA: Diagnosis not present

## 2023-08-16 DIAGNOSIS — E1169 Type 2 diabetes mellitus with other specified complication: Secondary | ICD-10-CM | POA: Diagnosis not present

## 2023-08-16 DIAGNOSIS — E1165 Type 2 diabetes mellitus with hyperglycemia: Secondary | ICD-10-CM | POA: Diagnosis not present

## 2023-08-19 NOTE — Telephone Encounter (Addendum)
 Called patient to let them know about CPAP titration appointment on 11/08/23. Patient requests to have it canceled due to him not wanting to do it. I did advise the patient that insurance is denying the Bipap and the patient would need to have the CPAP titration done that stats that he failed CPAP. Or the insurance company would not cover it. He is aware and still would like it canceled.   We will cancel the CPAP Titration appointment. Patient requests to have CPAP supplies ordered.

## 2023-08-19 NOTE — Telephone Encounter (Signed)
 FYI

## 2023-08-23 ENCOUNTER — Ambulatory Visit (INDEPENDENT_AMBULATORY_CARE_PROVIDER_SITE_OTHER): Admitting: Medical

## 2023-08-23 ENCOUNTER — Ambulatory Visit: Admitting: Medical

## 2023-08-23 VITALS — BP 130/64 | HR 80 | Resp 18 | Ht 75.0 in | Wt 294.0 lb

## 2023-08-23 DIAGNOSIS — R42 Dizziness and giddiness: Secondary | ICD-10-CM | POA: Diagnosis not present

## 2023-08-23 DIAGNOSIS — G4739 Other sleep apnea: Secondary | ICD-10-CM | POA: Diagnosis not present

## 2023-08-23 DIAGNOSIS — H532 Diplopia: Secondary | ICD-10-CM | POA: Diagnosis not present

## 2023-08-23 DIAGNOSIS — E039 Hypothyroidism, unspecified: Secondary | ICD-10-CM

## 2023-08-23 MED ORDER — SITAGLIPTIN PHOSPHATE 25 MG PO TABS: 25.0000 mg | ORAL_TABLET | Freq: Every day | ORAL | 3 refills | Status: AC

## 2023-08-23 MED ORDER — CLONAZEPAM 0.5 MG PO TABS: 0.5000 mg | ORAL_TABLET | Freq: Two times a day (BID) | ORAL | 0 refills | Status: AC | PRN

## 2023-08-23 NOTE — Patient Instructions (Addendum)
 Chronic Dizziness Chronic dizziness with balance issues, diplopia, and tinnitus. Differential includes mal de debarquement syndrome, myasthenia gravis, and cerebellar atrophy. Neurologist recommended further evaluation. - Prescribe clonazepam 1 tablet twice daily for 5 days to assess response.(Neurologist stated this can be used for mal de debarquement syndrome) - Refer to eye specialist for prism  glasses if needed.(let me know if you find your prism  glasses) - Follow up with neurologist for further evaluation and lab results. - Consider vestibular testing.  Diabetes Mellitus Type 2 A1c at 7.1, acceptable for age. Blood glucose generally within normal range. - Continue Januvia  25 mg daily. - Refill Januvia  with a 90-day supply and three refills. - Monitor blood glucose levels regularly and consume a snack if levels drop below 90 mg/dL.  Hypothyroidism Elevated TSH with normal T4. No change in medication. - Repeat TSH and T4 tests.  Vitamin B12 Deficiency Vitamin B12 level at 367, below optimal range of 500-600. - Advise taking 2000 mcg of vitamin B12 daily.  General Health Maintenance Engages in weight lifting. No significant lab findings except low vitamin B12.  Follow up one month with me or sooner if needed

## 2023-08-23 NOTE — Progress Notes (Signed)
 Subjective:    Patient ID: Michael Whitney, male    DOB: 1950/06/01, 73 y.o.   MRN: [id number]  HPI  Pt last AVS below in   Dizziness Chronic dizziness with diplopia and weakness. 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 Symptoms worsened during physical activities. Differential includes thyroid  eye disease, myasthenia gravis, and vertebral artery insufficiency. - Refer to neuro-ophthalmologist on August 16, 2023. - Request review of 2023 CT and MRI imaging studies. - Discuss myasthenia gravis, thyroid  eye disease and vertebral artery insufficiency with neuro-ophthalmologist. - Consider re-evaluation of Tepezza  treatment for thyroid  eye disease. - Ensure review of October 2022 CT angiography studies. 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 Symptoms improved during treatment. - Discuss Tepezza  treatment benefits with neuro-ophthalmologist. - Repeat thyroid  function tests if not done by specialist. - Consider specialist intervention for insurance negotiation.   Atrial Fibrillation Atrial fibrillation with current rate control. Recent blood pressure and pulse normal. Reports fatigue possibly related to atrial fibrillation. - Continue cardiologist follow-up. - Monitor blood pressure and pulse regularly.   Fatigue Chronic fatigue potentially related to hypothyroidism, atrial fibrillation, or Hashimoto's disease. Recent thyroid  function tests indicated hypothyroid range. - Order CBC, metabolic panel, TSH, T4, B12, and A1c tests. - Schedule blood tests by end of week.   Diabetes Mellitus Blood glucose levels well-managed with Farxiga  and Januvia . Blood glucose today was 131 mg/dL. - Order A1c test for long-term glucose control.   Allergic Rhinitis Symptoms improved with Zyrtec  and Flonase . Dizziness improved with  antihistamine use. - Continue Zyrtec  and Flonase  as needed.   Follow-up Follow-up plans to ensure continuity of care and further evaluation of symptoms. - Schedule in-person follow-up on August 23, 2023, post neuro-ophthalmologist visit. - Ensure neuro-ophthalmologist sends summary note to primary care. - Schedule blood tests for CBC, metabolic panel, TSH, T4, B12, and A1c by end of week.   Neurologist A/P below. Assessment & Plan 1. Balance issues: Chronic. Possible Mal de Debarquement syndrome or inner ear complications. Consider myasthenia gravis. Patient has had inner ear or video nystagmogram testing did not see any central cause of his problem but did have some concerns of cerebellar atrophy. - Order lab tests - Consider differential diagnosis of myasthenia gravis versus mal de debarque syndrome which we usually treat with Klonopin. May consider labs for cerebellar ataxia. - Evaluate for benign positioning vertigo - Recommend Epley maneuver exercises  2. Double vision: Recent onset exacerbating balance issues. - Refer to eye specialist for prism  glasses - If related to myasthenia gravis, further neurological testing may be needed  3. Tinnitus. - Monitor and evaluate based on initial test results  4. Hashimoto's disease. - Include thyroid  function tests in lab work  5. Sleep apnea. - Continue CPAP use - Consider monitoring effectiveness via phone app for AHI tracking  Follow-up - Lab tests - Vestibular test  Diagnoses and all orders for this visit:  Obstructive sleep apnea treated with continuous positive airway pressure (CPAP)  Dizziness  Neural foraminal stenosis of cervical spine  Benign paroxysmal positional vertigo due to bilateral vestibular disorder - Muscle Specific Kinase (MuSK) Antibodies; Future - Acetylcholine Receptor (AChR)-Binding Antibodies; Future - Acetylcholine Receptor (AChR)-Blocking Antibodies; Future - Acetylcholine Receptor (AChR)-Modulating  Antibodies; Future - Vitamin B12; Future - Vitamin B1 (Thiamine ); Future - Protein Electrophoresis, Serum; Future - Sedimentation Rate (ESR); Future - Antineutrophil Cytoplasmic Antibodies (  ANCA) by IFA; Future - Ceruloplasmin; Future  Type 2 diabetes mellitus with hyperglycemia, without long-term current use of insulin (HCC)  Hyperlipidemia associated with type 2 diabetes mellitus (HCC)    Call or go to ER if any changes.  Safety and side effect discussed.       Michael Whitney is a 73 year old male who presents for follow-up after seeing a neurologist for balance issues, double vision, and tinnitus.  He experiences ongoing balance issues, double vision, and tinnitus, which have been evaluated by a neurologist. He has low to moderate anxiety related to his chronic dizziness, impacting his daily activities.  He has undergone Epley maneuvers in the past without relief. Eye exercises focusing on an 'X' on the wall provided some benefit. An eye specialist advised against using his prism  glasses as they were too strong. He received regular glasses with a film for distance correction, but they were ineffective due to a prescription error. Neurologist thinks he should use prism  glasses.  He has undergone various tests, including blood work for fatigue and neurological causes.   A previous at-home sleep study was conducted. He is currently on Januvia  25 mg for diabetes management, with an A1c of 7.1, and takes losartan  100 mg for blood pressure.  He has a history of fatigue with a low vitamin B12 level of 367, for which he takes supplements. His TSH was high, but T4 was normal, and there have been no changes in his thyroid  medication. He lifts weights regularly and has not experienced low blood sugar symptoms.  No baseline anxiety outside of the context of his chronic dizziness.   Review of Systems  Constitutional:  Negative for chills, fatigue and fever.  Respiratory:  Negative for  chest tightness, shortness of breath and wheezing.   Cardiovascular:  Negative for chest pain and palpitations.  Gastrointestinal:  Negative for abdominal pain, blood in stool and constipation.  Musculoskeletal:  Negative for back pain.  Neurological:  Positive for dizziness. Negative for seizures, weakness and headaches.       Chronic dizziness and off balance.  Hematological:  Negative for adenopathy. Does not bruise/bleed easily.  Psychiatric/Behavioral:  Negative for behavioral problems, decreased concentration and sleep disturbance. The patient is nervous/anxious.     Past Medical History:  Diagnosis Date   Atrial fibrillation (HCC)    Cataract    Diabetes mellitus without complication (HCC)    Hypertension    Seizures (HCC)    Thought to be related to something his GF gave him     Social History   Socioeconomic History   Marital status: Widowed    Spouse name: Not on file   Number of children: 2   Years of education: Not on file   Highest education level: Not on file  Occupational History   Occupation: retired    Comment: Environmental manager  Tobacco Use   Smoking status: Former    Types: Pipe   Smokeless tobacco: Never   Tobacco comments:    Quit 30 years ago.  Vaping Use   Vaping status: Never Used  Substance and Sexual Activity   Alcohol use: Yes    Alcohol/week: 2.0 standard drinks of alcohol    Types: 1 Shots of liquor, 1 Cans of beer per week    Comment: occasionally   Drug use: No   Sexual activity: Not on file  Other Topics Concern   Not on file  Social History Narrative   Not on file   Social  Drivers of Health   Financial Resource Strain: Low Risk  (08/21/2022)   Overall Financial Resource Strain (CARDIA)    Difficulty of Paying Living Expenses: Not hard at all  Food Insecurity: Food Insecurity Present (02/02/2023)   Hunger Vital Sign    Worried About Running Out of Food in the Last Year: Sometimes true    Ran Out of Food in the Last Year: Never true   Transportation Needs: No Transportation Needs (08/21/2022)   PRAPARE - Administrator, Civil Service (Medical): No    Lack of Transportation (Non-Medical): No  Physical Activity: Sufficiently Active (08/21/2022)   Exercise Vital Sign    Days of Exercise per Week: 5 days    Minutes of Exercise per Session: 70 min  Stress: Stress Concern Present (08/21/2022)   [employer] of Occupational Health - Occupational Stress Questionnaire    Feeling of Stress : To some extent  Social Connections: Socially Isolated (08/21/2022)   Social Connection and Isolation Panel    Frequency of Communication with Friends and Family: More than three times a week    Frequency of Social Gatherings with Friends and Family: Once a week    Attends Religious Services: Never    Database administrator or Organizations: No    Attends Banker Meetings: Never    Marital Status: Widowed  Intimate Partner Violence: Unknown (06/21/2022)   Received from [facility]   HITS    Physically Hurt: Not on file    Insult or Talk Down To: Not on file    Threaten Physical Harm: Not on file    Scream or Curse: Not on file    Past Surgical History:  Procedure Laterality Date   CATARACT EXTRACTION     PVC ABLATION N/A 07/14/2017   Procedure: PVC ABLATION;  Surgeon: Waddell Danelle ORN, MD;  Location: [facility] INVASIVE CV LAB;  Service: Cardiovascular;  Laterality: N/A;   RIGHT/LEFT HEART CATH AND CORONARY ANGIOGRAPHY N/A 10/24/2020   Procedure: RIGHT/LEFT HEART CATH AND CORONARY ANGIOGRAPHY;  Surgeon: [place], Peter M, MD;  Location: [facility] INVASIVE CV LAB;  Service: Cardiovascular;  Laterality: N/A;   TONSILLECTOMY     VARICOSE VEIN SURGERY      Family History  Problem Relation Age of Onset   CAD Mother        MI at age 65    Allergies  Allergen Reactions   Amlodipine  Other (See Comments)    Dizziness  dizziness    Current Outpatient Medications on File Prior to Visit  Medication Sig Dispense Refill    Accu-Chek FastClix Lancets MISC Use 1 lancet daily to test blood sugar (E11.9)     albuterol  (VENTOLIN  HFA) 108 (90 Base) MCG/ACT inhaler Inhale 2 puffs into the lungs every 6 (six) hours as needed for wheezing or shortness of breath. (Patient not taking: Reported on 05/24/2023) 18 g 5   ammonium lactate  (LAC-HYDRIN ) 12 % lotion Apply 1 Application topically as needed for dry skin. 400 g 0   azelastine  (ASTELIN ) 0.1 % nasal spray Place 2 sprays into both nostrils 2 (two) times daily. Use in each nostril as directed 30 mL 0   benzonatate  (TESSALON ) 100 MG capsule Take 1 capsule (100 mg total) by mouth 3 (three) times daily as needed for cough. (Patient not taking: Reported on 05/24/2023) 30 capsule 0   cetirizine  (ZYRTEC ) 10 MG tablet Take 1 tablet (10 mg total) by mouth 2 (two) times daily. 60 tablet 3   Cholecalciferol (VITAMIN  D-3) 25 MCG (1000 UT) CAPS Take 1,000 Units by mouth daily.     dapagliflozin  propanediol (FARXIGA ) 10 MG TABS tablet Take 1 tablet (10 mg total) by mouth daily before breakfast. 30 tablet 1   famotidine  (PEPCID ) 20 MG tablet TAKE 1 TABLET(20 MG) BY MOUTH TWICE DAILY (Patient not taking: Reported on 05/24/2023) 60 tablet 3   fluticasone  (FLONASE ) 50 MCG/ACT nasal spray SHAKE LIQUID AND USE 2 SPRAYS IN EACH NOSTRIL DAILY 16 g 1   hydrOXYzine  (VISTARIL ) 25 MG capsule TAKE 1 CAPSULE(25 MG) BY MOUTH AT BEDTIME AS NEEDED (Patient not taking: Reported on 05/24/2023) 30 capsule 0   JANUVIA  25 MG tablet TAKE 1 TABLET(25 MG) BY MOUTH DAILY (Patient not taking: Reported on 05/24/2023) 30 tablet 3   levothyroxine  (SYNTHROID ) 50 MCG tablet TAKE 1 TABLET(50 MCG) BY MOUTH DAILY 90 tablet 3   losartan  (COZAAR ) 100 MG tablet Take 1 tablet (100 mg total) by mouth daily. 30 tablet 0   mometasone  (ELOCON ) 0.1 % cream Apply topically daily as needed. 45 g 1   Multiple Vitamin (MULTI-VITAMIN) tablet Take 1 tablet by mouth daily.     Omega-3 Fatty Acids (FISH OIL) 1000 MG CAPS Take 1,000 mg by mouth  daily.     OVER THE COUNTER MEDICATION Take 6 drops by mouth daily. Vitamins A, D, and K     vitamin B-12 (CYANOCOBALAMIN ) 1000 MCG tablet Take 1,000 mcg by mouth daily.     Vitamin D , Ergocalciferol , (DRISDOL ) 1.25 MG (50000 UNIT) CAPS capsule Take 1 capsule (50,000 Units total) by mouth every 7 (seven) days. 5 capsule 12   XARELTO  20 MG TABS tablet TAKE 1 TABLET(20 MG) BY MOUTH DAILY WITH SUPPER 90 tablet 3   No current facility-administered medications on file prior to visit.    BP 130/64   Pulse 80   Resp 18   Ht 6' 3 (1.905 m)   Wt 294 lb (133.4 kg)   SpO2 95%   BMI 36.75 kg/m        Objective:   Physical Exam  General Mental Status- Alert. General Appearance- Not in acute distress.    Skin General: Color- Normal Color. Moisture- Normal Moisture.   Neck Carotid Arteries- Normal color. Moisture- Normal Moisture. No carotid bruits. No JVD.   Chest and Lung Exam Auscultation: Breath Sounds:-Normal.   Cardiovascular Auscultation:Rythm- Regular. Murmurs & Other Heart Sounds:Auscultation of the heart reveals- No Murmurs.   Abdomen Inspection:-Inspeection Normal. Palpation/Percussion:Note:No mass. Palpation and Percussion of the abdomen reveal- Non Tender, Non Distended + BS, no rebound or guarding.     Neurologic Cranial Nerve exam:- CN III-XII intact(No nystagmus), symmetric smile. Strength:- 5/5 equal and symmetric strength both upper and lower extremities.       Assessment & Plan:   Patient Instructions  Chronic Dizziness Chronic dizziness with balance issues, diplopia, and tinnitus. Differential includes mal de debarquement syndrome, myasthenia gravis, and cerebellar atrophy. Neurologist recommended further evaluation. - Prescribe clonazepam 1 tablet twice daily for 5 days to assess response. - Refer to eye specialist for prism  glasses if needed.(let me know if you find your prism  glasses) - Follow up with neurologist for further evaluation and lab  results. - Consider vestibular testing.  Diabetes Mellitus Type 2 A1c at 7.1, acceptable for age. Blood glucose generally within normal range. - Continue Januvia  25 mg daily. - Refill Januvia  with a 90-day supply and three refills. - Monitor blood glucose levels regularly and consume a snack if levels drop below 90 mg/dL.  Hypothyroidism Elevated TSH with normal T4. No change in medication. - Repeat TSH and T4 tests.  Vitamin B12 Deficiency Vitamin B12 level at 367, below optimal range of 500-600. - Advise taking 2000 mcg of vitamin B12 daily.  General Health Maintenance Engages in weight lifting. No significant lab findings except low vitamin B12.  Follow up one month with me or sooner if needed    Dallas Maxwell, PA-C     Time spent with patient today was 51  minutes which consisted of chart review, discussing diagnosis, work up treatment and documentation.

## 2023-08-24 ENCOUNTER — Ambulatory Visit (INDEPENDENT_AMBULATORY_CARE_PROVIDER_SITE_OTHER)

## 2023-08-24 ENCOUNTER — Ambulatory Visit: Payer: Self-pay | Admitting: Medical

## 2023-08-24 VITALS — BP 130/64 | HR 80 | Ht 75.0 in | Wt 294.0 lb

## 2023-08-24 DIAGNOSIS — Z0000 Encounter for general adult medical examination without abnormal findings: Secondary | ICD-10-CM

## 2023-08-24 LAB — T4, FREE: Free T4: 0.57 ng/dL — ABNORMAL LOW (ref 0.60–1.60)

## 2023-08-24 LAB — TSH: TSH: 5.79 u[IU]/mL — ABNORMAL HIGH (ref 0.35–5.50)

## 2023-08-24 MED ORDER — LEVOTHYROXINE SODIUM 75 MCG PO TABS
75.0000 ug | ORAL_TABLET | Freq: Every day | ORAL | 3 refills | Status: AC
Start: 2023-08-24 — End: ?

## 2023-08-24 NOTE — Progress Notes (Signed)
 Because this visit was a virtual/telehealth visit,  certain criteria was not obtained, such a blood pressure, CBG if applicable, and timed get up and go. Any medications not marked as taking were not mentioned during the medication reconciliation part of the visit. Any vitals not documented were not able to be obtained due to this being a telehealth visit or patient was unable to self-report a recent blood pressure reading due to a lack of equipment at home via telehealth. Vitals that have been documented are verbally provided by the patient.   Subjective:   Michael Whitney is a 73 y.o. who presents for a Medicare Wellness preventive visit.  As a reminder, Annual Wellness Visits don't include a physical exam, and some assessments may be limited, especially if this visit is performed virtually. We may recommend an in-person follow-up visit with your provider if needed.  Visit Complete: Virtual I connected with  Michael Whitney on 08/24/23 by a audio enabled telemedicine application and verified that I am speaking with the correct person using two identifiers.  Patient Location: Home  Provider Location: Office/Clinic  I discussed the limitations of evaluation and management by telemedicine. The patient expressed understanding and agreed to proceed.  Vital Signs: Because this visit was a virtual/telehealth visit, some criteria may be missing or patient reported. Any vitals not documented were not able to be obtained and vitals that have been documented are patient reported.  VideoDeclined- This patient declined Librarian, academic. Therefore the visit was completed with audio only.  Persons Participating in Visit: Patient.  AWV Questionnaire: No: Patient Medicare AWV questionnaire was not completed prior to this visit.  Cardiac Risk Factors include: advanced age (>71men, >34 women);diabetes mellitus;family history of premature cardiovascular disease;hypertension;male  gender;obesity (BMI >30kg/m2)     Objective:    Today's Vitals   08/24/23 1015  BP: 130/64  Pulse: 80  SpO2: 95%  Weight: 294 lb (133.4 kg)  Height: 6' 3 (1.905 m)  PainSc: 0-No pain   Body mass index is 36.75 kg/m.     08/24/2023   10:17 AM 02/02/2023    2:05 PM 08/21/2022    9:08 AM 05/14/2022   12:51 PM 08/18/2021    1:36 PM 02/06/2021    1:14 PM 12/11/2020   10:00 PM  Advanced Directives  Does Patient Have a Medical Advance Directive? Yes Yes Yes No Yes Yes No  Type of Estate agent of [place];Living will  Healthcare Power of [place];Living will  Healthcare Power of [employer];Living will Healthcare Power of [place];Living will;Out of facility DNR (pink MOST or yellow form)   Does patient want to make changes to medical advance directive?  No - Patient declined       Copy of Healthcare Power of Attorney in Chart? No - copy requested  No - copy requested  No - copy requested    Would patient like information on creating a medical advance directive?    No - Patient declined   No - Patient declined    Current Medications (verified) Outpatient Encounter Medications as of 08/24/2023  Medication Sig   Accu-Chek FastClix Lancets MISC Use 1 lancet daily to test blood sugar (E11.9)   albuterol  (VENTOLIN  HFA) 108 (90 Base) MCG/ACT inhaler Inhale 2 puffs into the lungs every 6 (six) hours as needed for wheezing or shortness of breath. (Patient not taking: Reported on 05/24/2023)   ammonium lactate  (LAC-HYDRIN ) 12 % lotion Apply 1 Application topically as needed for dry skin.  azelastine  (ASTELIN ) 0.1 % nasal spray Place 2 sprays into both nostrils 2 (two) times daily. Use in each nostril as directed   benzonatate  (TESSALON ) 100 MG capsule Take 1 capsule (100 mg total) by mouth 3 (three) times daily as needed for cough. (Patient not taking: Reported on 05/24/2023)   cetirizine  (ZYRTEC ) 10 MG tablet Take 1 tablet (10 mg total) by mouth 2 (two) times daily.    Cholecalciferol (VITAMIN D -3) 25 MCG (1000 UT) CAPS Take 1,000 Units by mouth daily.   clonazePAM (KLONOPIN) 0.5 MG tablet Take 1 tablet (0.5 mg total) by mouth 2 (two) times daily as needed for anxiety.   dapagliflozin  propanediol (FARXIGA ) 10 MG TABS tablet Take 1 tablet (10 mg total) by mouth daily before breakfast.   famotidine  (PEPCID ) 20 MG tablet TAKE 1 TABLET(20 MG) BY MOUTH TWICE DAILY (Patient not taking: Reported on 05/24/2023)   fluticasone  (FLONASE ) 50 MCG/ACT nasal spray SHAKE LIQUID AND USE 2 SPRAYS IN EACH NOSTRIL DAILY   hydrOXYzine  (VISTARIL ) 25 MG capsule TAKE 1 CAPSULE(25 MG) BY MOUTH AT BEDTIME AS NEEDED (Patient not taking: Reported on 05/24/2023)   JANUVIA  25 MG tablet TAKE 1 TABLET(25 MG) BY MOUTH DAILY (Patient not taking: Reported on 05/24/2023)   levothyroxine  (SYNTHROID ) 50 MCG tablet TAKE 1 TABLET(50 MCG) BY MOUTH DAILY   losartan  (COZAAR ) 100 MG tablet Take 1 tablet (100 mg total) by mouth daily.   mometasone  (ELOCON ) 0.1 % cream Apply topically daily as needed.   Multiple Vitamin (MULTI-VITAMIN) tablet Take 1 tablet by mouth daily.   Omega-3 Fatty Acids (FISH OIL) 1000 MG CAPS Take 1,000 mg by mouth daily.   OVER THE COUNTER MEDICATION Take 6 drops by mouth daily. Vitamins A, D, and K   sitaGLIPtin  (JANUVIA ) 25 MG tablet Take 1 tablet (25 mg total) by mouth daily.   vitamin B-12 (CYANOCOBALAMIN ) 1000 MCG tablet Take 1,000 mcg by mouth daily.   Vitamin D , Ergocalciferol , (DRISDOL ) 1.25 MG (50000 UNIT) CAPS capsule Take 1 capsule (50,000 Units total) by mouth every 7 (seven) days.   XARELTO  20 MG TABS tablet TAKE 1 TABLET(20 MG) BY MOUTH DAILY WITH SUPPER   No facility-administered encounter medications on file as of 08/24/2023.    Allergies (verified) Amlodipine    History: Past Medical History:  Diagnosis Date   Atrial fibrillation (HCC)    Cataract    Diabetes mellitus without complication (HCC)    Hypertension    Seizures (HCC)    Thought to be related to  something his GF gave him   Past Surgical History:  Procedure Laterality Date   CATARACT EXTRACTION     PVC ABLATION N/A 07/14/2017   Procedure: PVC ABLATION;  Surgeon: Waddell Danelle ORN, MD;  Location: [facility] INVASIVE CV LAB;  Service: Cardiovascular;  Laterality: N/A;   RIGHT/LEFT HEART CATH AND CORONARY ANGIOGRAPHY N/A 10/24/2020   Procedure: RIGHT/LEFT HEART CATH AND CORONARY ANGIOGRAPHY;  Surgeon: Swaziland, Peter M, MD;  Location: [facility] INVASIVE CV LAB;  Service: Cardiovascular;  Laterality: N/A;   TONSILLECTOMY     VARICOSE VEIN SURGERY     Family History  Problem Relation Age of Onset   CAD Mother        MI at age 53   Social History   Socioeconomic History   Marital status: Widowed    Spouse name: Not on file   Number of children: 2   Years of education: Not on file   Highest education level: Not on file  Occupational History  Occupation: retired    Comment: Environmental manager  Tobacco Use   Smoking status: Former    Types: Pipe   Smokeless tobacco: Never   Tobacco comments:    Quit 30 years ago.  Vaping Use   Vaping status: Never Used  Substance and Sexual Activity   Alcohol use: Yes    Alcohol/week: 2.0 standard drinks of alcohol    Types: 1 Shots of liquor, 1 Cans of beer per week    Comment: occasionally   Drug use: No   Sexual activity: Not on file  Other Topics Concern   Not on file  Social History Narrative   Not on file   Social Drivers of Health   Financial Resource Strain: Low Risk  (08/24/2023)   Overall Financial Resource Strain (CARDIA)    Difficulty of Paying Living Expenses: Not hard at all  Food Insecurity: No Food Insecurity (08/24/2023)   Hunger Vital Sign    Worried About Running Out of Food in the Last Year: Never true    Ran Out of Food in the Last Year: Never true  Transportation Needs: No Transportation Needs (08/24/2023)   PRAPARE - Administrator, Civil Service (Medical): No    Lack of Transportation (Non-Medical): No  Physical Activity:  Sufficiently Active (08/24/2023)   Exercise Vital Sign    Days of Exercise per Week: 5 days    Minutes of Exercise per Session: 70 min  Stress: Stress Concern Present (08/24/2023)   [employer] of Occupational Health - Occupational Stress Questionnaire    Feeling of Stress: To some extent  Social Connections: Socially Isolated (08/24/2023)   Social Connection and Isolation Panel    Frequency of Communication with Friends and Family: More than three times a week    Frequency of Social Gatherings with Friends and Family: Once a week    Attends Religious Services: Never    Database administrator or Organizations: No    Attends Banker Meetings: Never    Marital Status: Widowed    Tobacco Counseling Counseling given: Not Answered Tobacco comments: Quit 30 years ago.    Clinical Intake:  Pre-visit preparation completed: Yes  Pain : No/denies pain Pain Score: 0-No pain     BMI - recorded: 36.75 Nutritional Status: BMI > 30  Obese Nutritional Risks: None Diabetes: Yes CBG done?: No Did pt. bring in CBG monitor from home?: No  Lab Results  Component Value Date   HGBA1C 7.1 (H) 08/02/2023   HGBA1C 7.0 (H) 02/09/2023   HGBA1C 9.2 (H) 10/13/2022     How often do you need to have someone help you when you read instructions, pamphlets, or other written materials from your doctor or pharmacy?: 1 - Never  Interpreter Needed?: No  Information entered by :: Eston Heslin N. Ericson Nafziger, LPN.   Activities of Daily Living     08/24/2023   10:21 AM  In your present state of health, do you have any difficulty performing the following activities:  Hearing? 0  Vision? 1  Difficulty concentrating or making decisions? 0  Walking or climbing stairs? 0  Dressing or bathing? 0  Doing errands, shopping? 0  Preparing Food and eating ? N  Using the Toilet? N  In the past six months, have you accidently leaked urine? N  Do you have problems with loss of bowel control? N   Managing your Medications? N  Managing your Finances? N  Housekeeping or managing your Housekeeping? N    Patient  Care Team: Saguier, [name], PA-C as PCP - General (Internal Medicine) [place], Peter M, MD as PCP - Cardiology (Cardiology) Waddell Danelle ORN, MD as PCP - Electrophysiology (Cardiology) Skeet Juliene SAUNDERS, DO as Consulting Physician (Neurology)  I have updated your Care Teams any recent Medical Services you may have received from other providers in the past year.     Assessment:   This is a routine wellness examination for [place].  Hearing/Vision screen Hearing Screening - Comments:: Adequate hearing, no hearing aids. Vision Screening - Comments:: Patient has double vision.  Has seen varies eye doctors.   Goals Addressed             This Visit's Progress    08/24/2023: To get an understanding of why I am having gait issues.         Depression Screen     08/24/2023   10:18 AM 02/02/2023    2:05 PM 08/21/2022    9:08 AM 04/30/2022    1:44 PM 10/28/2021    6:07 PM 08/18/2021    1:43 PM 11/29/2020    9:03 AM  PHQ 2/9 Scores  PHQ - 2 Score 0 0 1 0 0 1 0  PHQ- 9 Score 1          Fall Risk     08/24/2023   10:18 AM 03/31/2023   11:22 AM 02/02/2023    2:05 PM 08/21/2022    9:02 AM 04/30/2022    1:44 PM  Fall Risk   Falls in the past year? 0 0 0 0 0  Number falls in past yr: 0   0 0  Injury with Fall? 0   0 0  Risk for fall due to : No Fall Risks   Impaired balance/gait   Follow up Falls evaluation completed   Falls evaluation completed Falls evaluation completed    MEDICARE RISK AT HOME:  Medicare Risk at Home Any stairs in or around the home?: Yes If so, are there any without handrails?: No Home free of loose throw rugs in walkways, pet beds, electrical cords, etc?: Yes Adequate lighting in your home to reduce risk of falls?: Yes Life alert?: No Use of a cane, walker or w/c?: Yes (CANE (PRN)) Grab bars in the bathroom?: Yes Shower chair or bench in shower?:  No Elevated toilet seat or a handicapped toilet?: Yes  TIMED UP AND GO:  Was the test performed?  No  Cognitive Function: 6CIT completed    08/24/2023   10:19 AM  MMSE - Mini Mental State Exam  Not completed: Unable to complete        08/24/2023   10:21 AM 08/21/2022    9:09 AM  6CIT Screen  What Year? 0 points 0 points  What month? 0 points 0 points  What time? 0 points 0 points  Count back from 20 0 points 4 points  Months in reverse 0 points 0 points  Repeat phrase 0 points 2 points  Total Score 0 points 6 points    Immunizations Immunization History  Administered Date(s) Administered   Fluad Quad(high Dose 65+) 11/29/2020, 12/23/2022   Fluzone Influenza virus vaccine,trivalent (IIV3), split virus 12/23/2018   Influenza Whole 12/23/2018   Influenza, High Dose Seasonal PF 11/08/2017, 10/23/2018   Influenza,inj,Quad PF,6+ Mos 01/23/2015, 11/20/2016   Influenza-Unspecified 01/23/2015, 11/26/2015, 11/26/2015, 11/20/2016, 10/23/2018   PFIZER(Purple Top)SARS-COV-2 Vaccination 04/01/2019, 04/26/2019   Pfizer Covid-19 Vaccine Bivalent Booster 80yrs & up 11/29/2020   Pneumococcal Conjugate-13 02/14/2016, 02/14/2016   Pneumococcal  Polysaccharide-23 01/23/2015, 01/23/2015, 08/12/2018    Screening Tests Health Maintenance  Topic Date Due   FOOT EXAM  Never done   Diabetic kidney evaluation - Urine ACR  Never done   DTaP/Tdap/Td (1 - Tdap) Never done   Zoster Vaccines- Shingrix (1 of 2) Never done   Colonoscopy  01/08/2021   COVID-19 Vaccine (4 - 2024-25 season) 10/25/2022   INFLUENZA VACCINE  09/24/2023   OPHTHALMOLOGY EXAM  12/15/2023   HEMOGLOBIN A1C  02/01/2024   Diabetic kidney evaluation - eGFR measurement  08/01/2024   Medicare Annual Wellness (AWV)  08/23/2024   Pneumococcal Vaccine: 50+ Years  Completed   Hepatitis C Screening  Completed   Hepatitis B Vaccines  Aged Out   HPV VACCINES  Aged Out   Meningococcal B Vaccine  Aged Out    Health  Maintenance  Health Maintenance Due  Topic Date Due   FOOT EXAM  Never done   Diabetic kidney evaluation - Urine ACR  Never done   DTaP/Tdap/Td (1 - Tdap) Never done   Zoster Vaccines- Shingrix (1 of 2) Never done   Colonoscopy  01/08/2021   COVID-19 Vaccine (4 - 2024-25 season) 10/25/2022   Health Maintenance Items Addressed: Yes Patient aware of current care gaps.  Immunization record was verified by [employer].    Additional Screening:  Vision Screening: Recommended annual ophthalmology exams for early detection of glaucoma and other disorders of the eye. Would you like a referral to an eye doctor? No    Dental Screening: Recommended annual dental exams for proper oral hygiene  Community Resource Referral / Chronic Care Management: CRR required this visit?  No   CCM required this visit?  No   Plan:    I have personally reviewed and noted the following in the patient's chart:   Medical and social history Use of alcohol, tobacco or illicit drugs  Current medications and supplements including opioid prescriptions. Patient is not currently taking opioid prescriptions. Functional ability and status Nutritional status Physical activity Advanced directives List of other physicians Hospitalizations, surgeries, and ER visits in previous 12 months Vitals Screenings to include cognitive, depression, and falls Referrals and appointments  In addition, I have reviewed and discussed with patient certain preventive protocols, quality metrics, and best practice recommendations. A written personalized care plan for preventive services as well as general preventive health recommendations were provided to patient.   Roz LOISE Fuller, LPN   04/02/7972   After Visit Summary: (MyChart) Due to this being a telephonic visit, the after visit summary with patients personalized plan was offered to patient via MyChart   Notes: Patient aware of current care gaps.  Immunization record was verified  by [employer]. Current care gaps include: Colonoscopy, Diabetic Foot Exam, Diabetic Kidney Evaluation-Urine ACR, Dtap, Covid and Shingrix vaccines.

## 2023-08-24 NOTE — Patient Instructions (Signed)
 [name] , Thank you for taking time out of your busy schedule to complete your Annual Wellness Visit with me. I enjoyed our conversation and look forward to speaking with you again next year. I, as well as your care team,  appreciate your ongoing commitment to your health goals. Please review the following plan we discussed and let me know if I can assist you in the future. Your Game plan/ To Do List    Referrals: If you haven't heard from the office you've been referred to, please reach out to them at the phone provided.   Follow up Visits: Next Medicare AWV with our clinical staff: 08/24/2024 at 10:10 a.m. phone visit with Nurse Health Advisor   Have you seen your provider in the last 6 months (3 months if uncontrolled diabetes)? Yes Next Office Visit with your provider: Return in 1 month for follow-up.  Patient will schedule.  Clinician Recommendations:  Aim for 30 minutes of exercise or brisk walking, 6-8 glasses of water, and 5 servings of fruits and vegetables each day.       This is a list of the screening recommended for you and due dates:  Health Maintenance  Topic Date Due   Complete foot exam   Never done   Yearly kidney health urinalysis for diabetes  Never done   DTaP/Tdap/Td vaccine (1 - Tdap) Never done   Zoster (Shingles) Vaccine (1 of 2) Never done   Colon Cancer Screening  01/08/2021   COVID-19 Vaccine (4 - 2024-25 season) 10/25/2022   Flu Shot  09/24/2023   Eye exam for diabetics  12/15/2023   Hemoglobin A1C  02/01/2024   Yearly kidney function blood test for diabetes  08/01/2024   Medicare Annual Wellness Visit  08/23/2024   Pneumococcal Vaccine for age over 75  Completed   Hepatitis C Screening  Completed   Hepatitis B Vaccine  Aged Out   HPV Vaccine  Aged Out   Meningitis B Vaccine  Aged Out    Advanced directives: (Copy Requested) Please bring a copy of your health care power of attorney and living will to the office to be added to your chart at your  convenience. You can mail to [facility] [address] [place], [place] [address] or email to [email or web address] Advance Care Planning is important because it:  [x]  Makes sure you receive the medical care that is consistent with your values, goals, and preferences  [x]  It provides guidance to your family and loved ones and reduces their decisional burden about whether or not they are making the right decisions based on your wishes.  Follow the link provided in your after visit summary or read over the paperwork we have mailed to you to help you started getting your Advance Directives in place. If you need assistance in completing these, please reach out to us  so that we can help you!  See attachments for Preventive Care and Fall Prevention Tips.

## 2023-08-24 NOTE — Addendum Note (Signed)
 Addended by: DORINA DALLAS HERO on: 08/24/2023 04:52 PM   Modules accepted: Orders

## 2023-08-25 ENCOUNTER — Telehealth: Payer: Self-pay

## 2023-08-25 NOTE — Telephone Encounter (Signed)
-----   Message from [facility] sent at 08/25/2023  6:07 AM EDT ----- Will you call pt and get him scheduled for office visit with me within next 3 weeks. For him always need 40 minutes. But try to explain to him need to keep office limited to below if possible as maintenance items often missed when dealing with acute problems(explain to pt maintenance item visit). Visit need to be primarily maintenance unless he has emergency type symptoms on day of visit.  Current care gaps include: Colonoscopy, Diabetic Foot Exam, Diabetic Kidney Evaluation-Urine ACR, Dtap, Covid and Shingrix vaccines. ----- Message ----- From: Tomie Roz SAILOR, LPN Sent: 04/02/7972  10:38 AM EDT To: [name] Saguier, PA-C  Please review the Medications, Problem List, Past Medical and Surgical Histories as well as Family and Social Histories.  Nurse Notes: Patient aware of current care gaps.  Immunization record was verified by [employer]. Current care gaps include: Colonoscopy, Diabetic Foot Exam, Diabetic Kidney Evaluation-Urine ACR, Dtap, Covid and Shingrix vaccines.  Shenika N. Tomie, LPN [facility] Annual Wellness Team Direct Dial: [phone]

## 2023-08-25 NOTE — Telephone Encounter (Signed)
 Appt scheduled for 4 weeks due to PCP on vacation

## 2023-08-30 ENCOUNTER — Ambulatory Visit (INDEPENDENT_AMBULATORY_CARE_PROVIDER_SITE_OTHER): Admitting: Family Medicine

## 2023-08-30 ENCOUNTER — Ambulatory Visit: Payer: Self-pay

## 2023-08-30 ENCOUNTER — Encounter: Payer: Self-pay | Admitting: Family Medicine

## 2023-08-30 ENCOUNTER — Other Ambulatory Visit: Payer: Self-pay | Admitting: Family Medicine

## 2023-08-30 VITALS — BP 129/50 | HR 80 | Ht 75.0 in | Wt 297.0 lb

## 2023-08-30 DIAGNOSIS — R051 Acute cough: Secondary | ICD-10-CM | POA: Diagnosis not present

## 2023-08-30 DIAGNOSIS — J069 Acute upper respiratory infection, unspecified: Secondary | ICD-10-CM

## 2023-08-30 LAB — POC COVID19 BINAXNOW: SARS Coronavirus 2 Ag: NEGATIVE

## 2023-08-30 MED ORDER — FLUTICASONE PROPIONATE 50 MCG/ACT NA SUSP: 2.0000 | Freq: Every day | NASAL | 1 refills | Status: DC

## 2023-08-30 MED ORDER — PREDNISONE 20 MG PO TABS: 40.0000 mg | ORAL_TABLET | Freq: Every day | ORAL | 0 refills | Status: AC

## 2023-08-30 NOTE — Telephone Encounter (Signed)
 FYI Only or Action Required?: FYI only for provider.  Patient was last seen in primary care on 08/23/2023 by Dorina Loving, PA-C. Called Nurse Triage reporting Wheezing. Symptoms began several days ago. Interventions attempted: Nothing. Symptoms are: gradually worsening.  Triage Disposition: See HCP Within 4 Hours (Or PCP Triage)  Patient/caregiver understands and will follow disposition?: Yes, will follow disposition  Pt was offered appts earlier today, declined, pt chose the 1440 appt.   Copied from CRM [id number]. Topic: Clinical - Red Word Triage >> Aug 30, 2023  8:31 AM Ernestene P wrote: Red Word that prompted transfer to Nurse Triage: congested, wheezing in chest Reason for Disposition  [1] MILD difficulty breathing (e.g., minimal/no SOB at rest, SOB with walking, pulse <100) AND [2] NEW-onset or WORSE than normal  Answer Assessment - Initial Assessment Questions 1. ONSET: When did the wheezing begin?      2 days ago 2. RESPIRATORY STATUS: Describe your child's breathing. What does it sound like? (e.g., wheezing, stridor, grunting, weak cry, unable to speak, retractions, rapid rate, cyanosis)     Wheezing at night, mild SOB, weakness 4. ASSOCIATED VIRAL INFECTION:fever?      Denies fever, states coughing up mucus,  5. ASSOCIATED ALLERGIES:       Seasonal allergies 6. RECURRENT EPISODES:      denies  Protocols used: Wheezing - Other Than Asthma-P-AH, Breathing Difficulty-A-AH

## 2023-08-30 NOTE — Progress Notes (Signed)
 Acute Office Visit  Subjective:     Patient ID: Michael Whitney, male    DOB: Aug 04, 1950, 73 y.o.   MRN: [id number]  Chief Complaint  Patient presents with   Cough    Cough   Patient is in today for URI symptoms.   Discussed the use of AI scribe software for clinical note transcription with the patient, who gave verbal consent to proceed.  History of Present Illness Michael Whitney is a 73 year old male who presents with congestion, wheezing, and shortness of breath.  He has been experiencing congestion and wheezing, particularly at night, for the past two to three days. His nasal congestion worsens at night, followed by rhinorrhea in the morning. He also experiences shortness of breath and wheezing in his chest at times.  He has increased the use of his albuterol  inhaler over the last couple of days to manage his symptoms. He denies recent contact with sick individuals but mentions a recent visit to a medical facility where he might have been exposed to pathogens.  He is uncertain about having a fever due to a malfunctioning thermometer. He reports cough with some clear mucus.  He has a history of allergies and uses nasal spray and allergy medication, though he is uncertain of their effectiveness. He experiences sinus pressure in his forehead, which he attributes to his allergies.         All review of systems negative except what is listed in the HPI      Objective:    BP (!) 129/50   Pulse 80   Ht 6' 3 (1.905 m)   Wt 297 lb (134.7 kg)   SpO2 98%   BMI 37.12 kg/m    Physical Exam Vitals reviewed.  Constitutional:      Appearance: Normal appearance.  HENT:     Head: Normocephalic and atraumatic.     Right Ear: Tympanic membrane normal.     Left Ear: There is impacted cerumen.     Nose: Congestion present.     Mouth/Throat:     Pharynx: No oropharyngeal exudate or posterior oropharyngeal erythema.  Cardiovascular:     Rate and Rhythm: Normal rate and  regular rhythm.  Pulmonary:     Effort: Pulmonary effort is normal.     Breath sounds: No wheezing, rhonchi or rales.  Musculoskeletal:     Cervical back: Normal range of motion and neck supple.  Skin:    General: Skin is warm and dry.  Neurological:     Mental Status: He is alert and oriented to person, place, and time.  Psychiatric:        Mood and Affect: Mood normal.        Behavior: Behavior normal.        Thought Content: Thought content normal.        Judgment: Judgment normal.        Results for orders placed or performed in visit on 08/30/23  POC COVID-19 BinaxNow  Result Value Ref Range   SARS Coronavirus 2 Ag Negative Negative        Assessment & Plan:   Problem List Items Addressed This Visit   None Visit Diagnoses       Acute cough    -  Primary   Relevant Orders   POC COVID-19 BinaxNow (Completed)     Viral URI with cough       Relevant Medications   predniSONE  (DELTASONE ) 20 MG tablet   fluticasone  (FLONASE )  50 MCG/ACT nasal spray       Assessment & Plan Upper Respiratory Infection Symptoms likely due to allergy or viral infection causing irritation and mild shortness of breath. Concern for mild COPD exacerbation. - COVID negative today - Prescribed prednisone  for 5 days. Discussed side effects. - Advised albuterol  inhaler every 6 hours as needed. - Continue allergy nose sprays and pills. - Encouraged Mucinex , hydration, steam showers. - Instructed to report if no improvement by week's end for possible antibiotics.      Meds ordered this encounter  Medications   predniSONE  (DELTASONE ) 20 MG tablet    Sig: Take 2 tablets (40 mg total) by mouth daily with breakfast for 5 days.    Dispense:  10 tablet    Refill:  0    Supervising Provider:   DOMENICA BLACKBIRD A [[id number]]   fluticasone  (FLONASE ) 50 MCG/ACT nasal spray    Sig: Place 2 sprays into both nostrils daily.    Dispense:  16 g    Refill:  1    Supervising Provider:   DOMENICA BLACKBIRD A  [[id number]]    Return if symptoms worsen or fail to improve.  Michael Whitney Mon, NP

## 2023-08-31 NOTE — Telephone Encounter (Signed)
 I called and spoke with the pt  He refuses the titration study and wants to go with CPAP  He is scheduled for ov with Tammy on 10/13/23

## 2023-08-31 NOTE — Telephone Encounter (Signed)
 Per Glade at [facility]- HI, just following up on this order, is pt going to have a new titration? we are holding the order pending the new testing since his original testing is not available. Please advise Thank You

## 2023-08-31 NOTE — Telephone Encounter (Signed)
 Order for CPAP placed. Keep ov next month

## 2023-08-31 NOTE — Addendum Note (Signed)
 Addended by: ORLIE MADELIN RAMAN on: 08/31/2023 05:52 PM   Modules accepted: Orders

## 2023-08-31 NOTE — Telephone Encounter (Signed)
 Please call patient : options are I can order CPAP and start him on this w/ follow up   Or needs to go to CPAP titration study (transition to BIPAP) in order to get new machine .   He will need to decide how he would like to proceed. Glad to make ov to discuss in person

## 2023-09-21 ENCOUNTER — Encounter: Payer: Self-pay | Admitting: Medical

## 2023-09-21 ENCOUNTER — Ambulatory Visit (INDEPENDENT_AMBULATORY_CARE_PROVIDER_SITE_OTHER): Admitting: Medical

## 2023-09-21 VITALS — BP 120/70 | HR 80 | Temp 98.2°F | Resp 18 | Ht 75.0 in | Wt 293.0 lb

## 2023-09-21 DIAGNOSIS — R609 Edema, unspecified: Secondary | ICD-10-CM

## 2023-09-21 DIAGNOSIS — R062 Wheezing: Secondary | ICD-10-CM | POA: Diagnosis not present

## 2023-09-21 DIAGNOSIS — E038 Other specified hypothyroidism: Secondary | ICD-10-CM | POA: Diagnosis not present

## 2023-09-21 DIAGNOSIS — E119 Type 2 diabetes mellitus without complications: Secondary | ICD-10-CM | POA: Diagnosis not present

## 2023-09-21 DIAGNOSIS — I4821 Permanent atrial fibrillation: Secondary | ICD-10-CM

## 2023-09-21 DIAGNOSIS — I10 Essential (primary) hypertension: Secondary | ICD-10-CM

## 2023-09-21 DIAGNOSIS — J683 Other acute and subacute respiratory conditions due to chemicals, gases, fumes and vapors: Secondary | ICD-10-CM

## 2023-09-21 DIAGNOSIS — L603 Nail dystrophy: Secondary | ICD-10-CM | POA: Diagnosis not present

## 2023-09-21 DIAGNOSIS — E039 Hypothyroidism, unspecified: Secondary | ICD-10-CM | POA: Diagnosis not present

## 2023-09-21 DIAGNOSIS — R42 Dizziness and giddiness: Secondary | ICD-10-CM

## 2023-09-21 DIAGNOSIS — Z7984 Long term (current) use of oral hypoglycemic drugs: Secondary | ICD-10-CM

## 2023-09-21 LAB — MICROALBUMIN / CREATININE URINE RATIO
Creatinine,U: 82.6 mg/dL
Microalb Creat Ratio: 56.7 mg/g — ABNORMAL HIGH (ref 0.0–30.0)
Microalb, Ur: 4.7 mg/dL — ABNORMAL HIGH (ref 0.0–1.9)

## 2023-09-21 MED ORDER — FLUTICASONE PROPIONATE HFA 110 MCG/ACT IN AERO: 2.0000 | INHALATION_SPRAY | Freq: Two times a day (BID) | RESPIRATORY_TRACT | 12 refills | Status: DC

## 2023-09-21 NOTE — Patient Instructions (Addendum)
 Chronic dizziness and vertigo Chronic dizziness persists despite extensive workup. Differential includes myasthenia gravis, cerebellar atrophy, and mild mal debarquement syndrome. - Follow up with neurologist Dr. Camellia Custard at [facility]. - Advise emergency care if dizziness is accompanied by neurologic deficits.  Wheezing and reactive airway disease Intermittent wheezing possibly exacerbated by extreme temperatures. History of smoking and reactive airway disease. Albuterol  provides symptom relief but does not reduce inflammation. - Prescribe Flovent  inhaler, two inhalations twice a day. - Continue albuterol  inhaler as needed. - Consider steroid inhaler if albuterol  is used frequently.  Type 2 diabetes mellitus Type 2 diabetes with recent A1c of 7.1%, indicating moderate control. A1c target between 6.5% and 7% to avoid hypoglycemia. - Continue Januvia  25 mg daily. - Monitor blood glucose levels regularly. - Aim for A1c between 6.5% and 7% while avoiding hypoglycemia.  Atrial fibrillation, rate controlled Atrial fibrillation with rate controlled at 80 bpm. On Xarelto  20 mg for anticoagulation. - Continue Xarelto  20 mg daily.  Hypertension Hypertension with recent home blood pressure readings of 120/70 mmHg. - Continue losartan  100 mg daily. - Monitor blood pressure regularly.  Hypothyroidism Hypothyroidism with recent increase in levothyroxine  to 75 mcg due to elevated TSH and decreased T4. - Continue levothyroxine  75 mcg daily. - Repeat TSH and T4 in late October. - Use moisturizer for dry skin.  Onychomycosis (toenail fungal infection) Onychomycosis with dystrophic and brittle nails. Dry, flaky skin on feet noted. - Refer to podiatrist for nail care and evaluation of skin condition.  Varicose veins of lower extremities Presence of varicose veins in lower extremities. - Continue wearing compression socks. - Monitor for any changes or symptoms.  General Health  Maintenance Discussion of vaccinations including Shingrix and Tdap. Need for updated foot exam noted. - Obtain Shingrix vaccination record from [facility]. - Consider Tdap vaccination if not updated in the last 10 years. - Refer to podiatrist for foot exam.  Follow up October or sooner if needed               Dr. Custard  [facility] - Neurology [facility]  [address]  [address]  [place], [place] [address]  [phone]

## 2023-09-21 NOTE — Progress Notes (Signed)
 Subjective:    Patient ID: Michael Whitney, male    DOB: 03/05/1950, 73 y.o.   MRN: [id number]  HPI  Michael Whitney is a 73 year old male with asthma who presents with recurrent wheezing and dizziness.  He experiences recurrent wheezing, particularly in hot, humid weather, with symptoms including nasal dripping upon exposure to outdoor air and intermittent wheezing. The patient reports that after receiving prednisone  and Flonase  for a respiratory infection, his wheezing improved but has since returned intermittently. Albuterol  is used as needed but does not fully alleviate symptoms, and extreme temperatures can trigger wheezing.  Chronic dizziness has been present for several years, with no relief from Epley maneuvers. He experiences double vision during these exercises. A neurologist mentioned possible mild mal de debarquement syndrome. The patient has undergone extensive workups, including blood tests, but has not received a clear answer for his symptoms.  Occasional swelling in the left foot is slightly noticeable and not associated with pain. He uses compression socks and elevates his legs at night. No pain behind the knee is reported.  He has a history of hypothyroidism, currently managed with levothyroxine  75 mcg. He reports experiencing dry skin. His TSH was previously increased, and T4 had dropped slightly.  Diabetes is managed with Januvia  25 mg, with a recent A1c of 7.1%. Blood sugar was 128 mg/dL after breakfast. He is not on Farxiga  due to cost concerns.  Hypertension and atrial fibrillation are managed with losartan  100 mg and Xarelto  20 mg. Home blood pressure readings are 120/70 mmHg with a pulse of 70 bpm. He is not on a beta blocker or other rate-controlling medication.      Review of Systems  Constitutional:  Negative for chills, fatigue and fever.  HENT:  Negative for congestion, hearing loss and postnasal drip.   Respiratory:  Positive for wheezing. Negative for cough,  chest tightness and stridor.   Cardiovascular:  Negative for chest pain and palpitations.  Gastrointestinal:  Negative for abdominal pain, blood in stool and rectal pain.  Genitourinary:  Negative for dysuria, frequency, penile pain and urgency.  Musculoskeletal:  Negative for back pain, joint swelling and neck pain.       See hpi  Skin:  Negative for rash.  Neurological:  Positive for dizziness. Negative for syncope, weakness and light-headedness.       Chronic dizziness  Hematological:  Negative for adenopathy.  Psychiatric/Behavioral:  Negative for behavioral problems and hallucinations. The patient is not nervous/anxious.    Past Medical History:  Diagnosis Date   Atrial fibrillation (HCC)    Cataract    Diabetes mellitus without complication (HCC)    Hypertension    Seizures (HCC)    Thought to be related to something his GF gave him     Social History   Socioeconomic History   Marital status: Widowed    Spouse name: Not on file   Number of children: 2   Years of education: Not on file   Highest education level: Not on file  Occupational History   Occupation: retired    Comment: Environmental manager  Tobacco Use   Smoking status: Former    Types: Pipe   Smokeless tobacco: Never   Tobacco comments:    Quit 30 years ago.  Vaping Use   Vaping status: Never Used  Substance and Sexual Activity   Alcohol use: Yes    Alcohol/week: 2.0 standard drinks of alcohol    Types: 1 Shots of liquor, 1 Cans of beer  per week    Comment: occasionally   Drug use: No   Sexual activity: Not on file  Other Topics Concern   Not on file  Social History Narrative   Not on file   Social Drivers of Health   Financial Resource Strain: Low Risk  (08/24/2023)   Overall Financial Resource Strain (CARDIA)    Difficulty of Paying Living Expenses: Not hard at all  Food Insecurity: No Food Insecurity (08/24/2023)   Hunger Vital Sign    Worried About Running Out of Food in the Last Year: Never true     Ran Out of Food in the Last Year: Never true  Transportation Needs: No Transportation Needs (08/24/2023)   PRAPARE - Administrator, Civil Service (Medical): No    Lack of Transportation (Non-Medical): No  Physical Activity: Sufficiently Active (08/24/2023)   Exercise Vital Sign    Days of Exercise per Week: 5 days    Minutes of Exercise per Session: 70 min  Stress: Stress Concern Present (08/24/2023)   [employer] of Occupational Health - Occupational Stress Questionnaire    Feeling of Stress: To some extent  Social Connections: Socially Isolated (08/24/2023)   Social Connection and Isolation Panel    Frequency of Communication with Friends and Family: More than three times a week    Frequency of Social Gatherings with Friends and Family: Once a week    Attends Religious Services: Never    Database administrator or Organizations: No    Attends Banker Meetings: Never    Marital Status: Widowed  Intimate Partner Violence: Not At Risk (08/24/2023)   Humiliation, Afraid, Rape, and Kick questionnaire    Fear of Current or Ex-Partner: No    Emotionally Abused: No    Physically Abused: No    Sexually Abused: No    Past Surgical History:  Procedure Laterality Date   CATARACT EXTRACTION     PVC ABLATION N/A 07/14/2017   Procedure: PVC ABLATION;  Surgeon: Waddell Danelle ORN, MD;  Location: [facility] INVASIVE CV LAB;  Service: Cardiovascular;  Laterality: N/A;   RIGHT/LEFT HEART CATH AND CORONARY ANGIOGRAPHY N/A 10/24/2020   Procedure: RIGHT/LEFT HEART CATH AND CORONARY ANGIOGRAPHY;  Surgeon: Swaziland, Peter M, MD;  Location: [facility] INVASIVE CV LAB;  Service: Cardiovascular;  Laterality: N/A;   TONSILLECTOMY     VARICOSE VEIN SURGERY      Family History  Problem Relation Age of Onset   CAD Mother        MI at age 44    Allergies  Allergen Reactions   Amlodipine  Other (See Comments)    Dizziness  dizziness    Current Outpatient Medications on File Prior to Visit   Medication Sig Dispense Refill   Accu-Chek FastClix Lancets MISC Use 1 lancet daily to test blood sugar (E11.9)     albuterol  (VENTOLIN  HFA) 108 (90 Base) MCG/ACT inhaler Inhale 2 puffs into the lungs every 6 (six) hours as needed for wheezing or shortness of breath. 18 g 5   ammonium lactate  (LAC-HYDRIN ) 12 % lotion Apply 1 Application topically as needed for dry skin. 400 g 0   azelastine  (ASTELIN ) 0.1 % nasal spray Place 2 sprays into both nostrils 2 (two) times daily. Use in each nostril as directed 30 mL 0   benzonatate  (TESSALON ) 100 MG capsule Take 1 capsule (100 mg total) by mouth 3 (three) times daily as needed for cough. 30 capsule 0   cetirizine  (ZYRTEC ) 10 MG tablet Take  1 tablet (10 mg total) by mouth 2 (two) times daily. 60 tablet 3   Cholecalciferol (VITAMIN D -3) 25 MCG (1000 UT) CAPS Take 1,000 Units by mouth daily.     clonazePAM  (KLONOPIN ) 0.5 MG tablet Take 1 tablet (0.5 mg total) by mouth 2 (two) times daily as needed for anxiety. 10 tablet 0   dapagliflozin  propanediol (FARXIGA ) 10 MG TABS tablet Take 1 tablet (10 mg total) by mouth daily before breakfast. 30 tablet 1   famotidine  (PEPCID ) 20 MG tablet TAKE 1 TABLET(20 MG) BY MOUTH TWICE DAILY 60 tablet 3   fluticasone  (FLONASE ) 50 MCG/ACT nasal spray Place 2 sprays into both nostrils daily. 48 g 0   hydrOXYzine  (VISTARIL ) 25 MG capsule TAKE 1 CAPSULE(25 MG) BY MOUTH AT BEDTIME AS NEEDED 30 capsule 0   JANUVIA  25 MG tablet TAKE 1 TABLET(25 MG) BY MOUTH DAILY 30 tablet 3   levothyroxine  (SYNTHROID ) 75 MCG tablet Take 1 tablet (75 mcg total) by mouth daily. 90 tablet 3   losartan  (COZAAR ) 100 MG tablet Take 1 tablet (100 mg total) by mouth daily. 30 tablet 0   mometasone  (ELOCON ) 0.1 % cream Apply topically daily as needed. 45 g 1   Multiple Vitamin (MULTI-VITAMIN) tablet Take 1 tablet by mouth daily.     Omega-3 Fatty Acids (FISH OIL) 1000 MG CAPS Take 1,000 mg by mouth daily.     OVER THE COUNTER MEDICATION Take 6 drops by  mouth daily. Vitamins A, D, and K     sitaGLIPtin  (JANUVIA ) 25 MG tablet Take 1 tablet (25 mg total) by mouth daily. 90 tablet 3   vitamin B-12 (CYANOCOBALAMIN ) 1000 MCG tablet Take 1,000 mcg by mouth daily.     Vitamin D , Ergocalciferol , (DRISDOL ) 1.25 MG (50000 UNIT) CAPS capsule Take 1 capsule (50,000 Units total) by mouth every 7 (seven) days. 5 capsule 12   XARELTO  20 MG TABS tablet TAKE 1 TABLET(20 MG) BY MOUTH DAILY WITH SUPPER 90 tablet 3   No current facility-administered medications on file prior to visit.    BP 120/70   Pulse 80   Temp 98.2 F (36.8 C)   Resp 18   Ht 6' 3 (1.905 m)   Wt 293 lb (132.9 kg)   SpO2 98%   BMI 36.62 kg/m          Objective:   Physical Exam  General Mental Status- Alert. General Appearance- Not in acute distress.   Skin General: Color- Normal Color. Moisture- Normal Moisture.  Neck Carotid Arteries- Normal color. Moisture- Normal Moisture. No carotid bruits. No JVD.  Chest and Lung Exam Auscultation: Breath Sounds:-CTA except mild expiratory wheeze at bases.  Cardiovascular Auscultation:Rythm- Regular. Murmurs & Other Heart Sounds:Auscultation of the heart reveals- No Murmurs.  Abdomen Inspection:-Inspeection Normal. Palpation/Percussion:Note:No mass. Palpation and Percussion of the abdomen reveal- Non Tender, Non Distended + BS, no rebound or guarding.   Neurologic Cranial Nerve exam:- CN III-XII intact(No nystagmus), symmetric smile. Strength:- 5/5 equal and symmetric strength both upper and lower extremities.    Lower ext- see quality metrics. Calfs symmetric, no edema. Negative homans signs.     Assessment & Plan:   Patient Instructions  Chronic dizziness and vertigo Chronic dizziness persists despite extensive workup. Differential includes myasthenia gravis, cerebellar atrophy, and mild mal debarquement syndrome. - Follow up with neurologist Dr. Camellia Custard at [facility]. - Advise emergency care if dizziness  is accompanied by neurologic deficits.  Wheezing and reactive airway disease Intermittent wheezing possibly exacerbated by extreme  temperatures. History of smoking and reactive airway disease. Albuterol  provides symptom relief but does not reduce inflammation. - Prescribe Flovent  inhaler, two inhalations twice a day. - Continue albuterol  inhaler as needed. - Consider steroid inhaler if albuterol  is used frequently.  Type 2 diabetes mellitus Type 2 diabetes with recent A1c of 7.1%, indicating moderate control. A1c target between 6.5% and 7% to avoid hypoglycemia. - Continue Januvia  25 mg daily. - Monitor blood glucose levels regularly. - Aim for A1c between 6.5% and 7% while avoiding hypoglycemia.  Atrial fibrillation, rate controlled Atrial fibrillation with rate controlled at 80 bpm. On Xarelto  20 mg for anticoagulation. - Continue Xarelto  20 mg daily.  Hypertension Hypertension with recent home blood pressure readings of 120/70 mmHg. - Continue losartan  100 mg daily. - Monitor blood pressure regularly.  Hypothyroidism Hypothyroidism with recent increase in levothyroxine  to 75 mcg due to elevated TSH and decreased T4. - Continue levothyroxine  75 mcg daily. - Repeat TSH and T4 in late October. - Use moisturizer for dry skin.  Onychomycosis (toenail fungal infection) Onychomycosis with dystrophic and brittle nails. Dry, flaky skin on feet noted. - Refer to podiatrist for nail care and evaluation of skin condition.  Varicose veins of lower extremities Presence of varicose veins in lower extremities. - Continue wearing compression socks. - Monitor for any changes or symptoms.  General Health Maintenance Discussion of vaccinations including Shingrix and Tdap. Need for updated foot exam noted. - Obtain Shingrix vaccination record from [facility]. - Consider Tdap vaccination if not updated in the last 10 years. - Refer to podiatrist for foot exam.  Follow up October or sooner if  needed               Dr. Leopoldo Quinton [facility] - Neurology [facility]  [address]  [address]  [place], [place] [address]  [phone]       Dallas Maxwell, PA-C

## 2023-09-22 ENCOUNTER — Ambulatory Visit: Payer: Self-pay | Admitting: Medical

## 2023-09-22 NOTE — Telephone Encounter (Signed)
 I called the patient and provided the information per Madelin Stank NP regarding the CPAP machine being ordered.  He states he was told that insurance was not going to cover the CPAP machine.  He did not want to have the titration study d/t dizziness that his specialists cannot figure out what is causing it.    I advised him I would call [facility] and find out what was going on with the CPAP machine and call him back.  He verbalized understanding.  I called [facility] and was told that the order for the CPAP machine has been processed and he has been contacted.  His insurance is covering 80%.  The first bill would be $110.50 and after that the cost would be around $30-$40 per month until the machine is paid for.  Advised I would make the patient aware.  I called the patient back and provided the above information.  I provide him with [facility]'s phone # to call and speak with them directly regarding CPAP machine and payment.  Nothing further needed.

## 2023-09-27 MED ORDER — BECLOMETHASONE DIPROP HFA 40 MCG/ACT IN AERB: 2.0000 | INHALATION_SPRAY | Freq: Two times a day (BID) | RESPIRATORY_TRACT | 2 refills | Status: DC

## 2023-09-27 NOTE — Telephone Encounter (Signed)
 Copied from CRM [id number]. Topic: Clinical - Medication Question >> Sep 27, 2023 11:35 AM Deaijah H wrote: Reason for CRM: Patient called in stating pharmacy advised that Insurance will not cover fluticasone  (FLOVENT  HFA) 110 MCG/ACT inhaler and want to know if something else can be prescribed that will be covered. Please call [phone]

## 2023-09-27 NOTE — Telephone Encounter (Signed)
 Copied from CRM #[id number]. Topic: Clinical - Medical Advice >> Sep 27, 2023  4:37 PM Tiffini S wrote: Reason for CRM:  Llai with [facility] [phone] called stating that fluticasone  (FLOVENT  HFA) 110 MCG/ACT inhaler was sent in and is not covered by the insurance. Can a different medication be sent in?

## 2023-09-27 NOTE — Addendum Note (Signed)
 Addended by: DORINA DALLAS HERO on: 09/27/2023 07:56 PM   Modules accepted: Orders

## 2023-10-05 ENCOUNTER — Ambulatory Visit: Admitting: Podiatry

## 2023-10-05 ENCOUNTER — Telehealth: Payer: Self-pay

## 2023-10-05 NOTE — Telephone Encounter (Signed)
 Copied from CRM [id number]. Topic: Clinical - Order For Equipment >> Oct 05, 2023  1:58 PM Michael Whitney wrote: Reason for CRM: Patient is calling to inquire about who his DME company is,as he has no idea who he needs to contact to be able to schedule to pick up his new supplies. Not noted in Snapshot.

## 2023-10-08 ENCOUNTER — Ambulatory Visit: Admitting: Podiatry

## 2023-10-11 ENCOUNTER — Other Ambulatory Visit: Payer: Self-pay | Admitting: Medical

## 2023-10-13 ENCOUNTER — Ambulatory Visit: Admitting: Adult Health

## 2023-10-19 DIAGNOSIS — G4733 Obstructive sleep apnea (adult) (pediatric): Secondary | ICD-10-CM | POA: Diagnosis not present

## 2023-10-22 ENCOUNTER — Other Ambulatory Visit: Payer: Self-pay | Admitting: Medical

## 2023-10-22 MED ORDER — AMMONIUM LACTATE 12 % EX LOTN: 1.0000 | TOPICAL_LOTION | CUTANEOUS | 0 refills | Status: AC | PRN

## 2023-10-22 NOTE — Telephone Encounter (Signed)
 Copied from CRM [id number]. Topic: Clinical - Medication Refill >> Oct 22, 2023 11:51 AM [place] M wrote: Medication: ammonium lactate  (LAC-HYDRIN ) 12 % lotion  Has the patient contacted their pharmacy? Yes (Agent: If no, request that the patient contact the pharmacy for the refill. If patient does not wish to contact the pharmacy document the reason why and proceed with request.) (Agent: If yes, when and what did the pharmacy advise?)  This is the patient's preferred pharmacy:  [id number] [place] - [address] AT [facility] [address] [place] [address] Phone: [phone] Fax: [phone]  Is this the correct pharmacy for this prescription? Yes If no, delete pharmacy and type the correct one.   Has the prescription been filled recently? Yes  Is the patient out of the medication? Yes  Has the patient been seen for an appointment in the last year OR does the patient have an upcoming appointment? Yes  Can we respond through MyChart? Yes  Agent: Please be advised that Rx refills may take up to 3 business days. We ask that you follow-up with your pharmacy.

## 2023-11-08 ENCOUNTER — Encounter (HOSPITAL_BASED_OUTPATIENT_CLINIC_OR_DEPARTMENT_OTHER): Admitting: Pulmonary Disease

## 2023-11-09 ENCOUNTER — Encounter: Payer: Self-pay | Admitting: Podiatry

## 2023-11-09 ENCOUNTER — Ambulatory Visit: Admitting: Podiatry

## 2023-11-09 VITALS — Ht 75.0 in | Wt 293.0 lb

## 2023-11-09 DIAGNOSIS — B351 Tinea unguium: Secondary | ICD-10-CM | POA: Diagnosis not present

## 2023-11-09 DIAGNOSIS — I890 Lymphedema, not elsewhere classified: Secondary | ICD-10-CM | POA: Diagnosis not present

## 2023-11-09 DIAGNOSIS — M79672 Pain in left foot: Secondary | ICD-10-CM

## 2023-11-09 DIAGNOSIS — M79671 Pain in right foot: Secondary | ICD-10-CM

## 2023-11-09 NOTE — Progress Notes (Signed)
 Patient presents for evaluation and treatment of tenderness and some redness around nails feet.  Tenderness around toes with walking and wearing shoes.  Has swelling in the lower legs and gets a lot of venous pooling.  Whenever he is there is anything he can really do for this.  Physical exam:  General appearance: Alert, pleasant, and in no acute distress.  Vascular: Pedal pulses: DP 2/4 B/L, PT 1/4 B/L.  Moderate edema lower legs bilaterally.  Capillary refill time immediate  Neurologic: Light touch intact bilaterally.  Normal Achilles tendon reflex bilaterally no clonus or spasticity noted.  Dermatologic:  Nails thickened, disfigured, discolored 1-5 BL with subungual debris.  Redness and hypertrophic nail folds along nail folds bilaterally but no signs of drainage or infection.  Considerable venous pooling in the skin lower legs bilaterally  Musculoskeletal:  Mild hallux valgus deformities bilaterally.  Normal muscle strength lower extremity bilaterally   Diagnosis: 1. 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 2. Pain toes 1 through 5 bilaterally.  Plan: -New patient office visit for evaluation and management level 3.  Modifier 25. -Discussed with him the onychomycotic nails is recommended periodic debridement every 3 months.  Discussed with him also the lymphedema with the venous pooling.  I told him the best solution to this is wearing compression hose. -Debrided onychomycotic nails 1 through 5 bilaterally.  Sharply debrided nails with nail clipper and reduced with a power bur.  Return 3 months [facility]

## 2023-11-19 ENCOUNTER — Ambulatory Visit: Payer: Self-pay

## 2023-11-19 DIAGNOSIS — G4733 Obstructive sleep apnea (adult) (pediatric): Secondary | ICD-10-CM | POA: Diagnosis not present

## 2023-11-19 NOTE — Telephone Encounter (Signed)
 FYI Only or Action Required?: Action required by provider: request for appointment and clinical question for provider.  Patient was last seen in primary care on 09/21/2023 by Dorina Loving, PA-C.  Called Nurse Triage reporting Wheezing.  Symptoms began about a month ago.  Interventions attempted: Prescription medications: inhalers.  Symptoms are: unchanged.  Triage Disposition: See PCP When Office is Open (Within 3 Days)  Patient/caregiver understands and will follow disposition?: Yes   Copied from CRM [id number]. Topic: Clinical - Red Word Triage >> Nov 19, 2023  2:10 PM Rosina BIRCH wrote: Red Word that prompted transfer to Nurse Triage: wheezing and a little bit of shortness of breath Reason for Disposition  [1] MODERATE longstanding difficulty breathing (e.g., speaks in phrases, SOB even at rest, pulse 100-120) AND [2] SAME as normal  Answer Assessment - Initial Assessment Questions No available appts today. Advised UC. ED if symptoms worsen. Patient reports not currently having shortness of breath or wheezing. Pt aware of appt 11/22/23 and reports will go to UC/ED if symptoms worsen.  Patient reports wants to discuss with Loving ongoing wheezing and [facility].  1. RESPIRATORY STATUS: Describe your breathing? (e.g., wheezing, shortness of breath, unable to speak, severe coughing)   Shortness of breath and wheezing comes and goes,    Runny nose, wheezing, coughing phlegm(yellowish clear) may be allergies; currently not wheezing 2. ONSET: When did this breathing problem begin?  3 months ago; seen MD and pulmonary      3. PATTERN Does the difficult breathing come and go, or has it been constant since it started?      Comes and goes 4. SEVERITY: How bad is your breathing? (e.g., mild, moderate, severe)      mild 5. RECURRENT SYMPTOM: Have you had difficulty breathing before? If Yes, ask: When was the last time? and What happened that time?      yes 6. CARDIAC HISTORY: Do  you have any history of heart disease? 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 LUNG HISTORY: Do you have any history of lung disease?  (e.g., pulmonary embolus, asthma, emphysema) no 8. CAUSE: What do you think is causing the breathing problem?      allergies 9. OTHER SYMPTOMS: Do you have any other symptoms? (e.g., chest pain, cough, dizziness, fever, runny nose)  Runny nose, Coughing phlegm, dizziness(been dealing with for years)Denies fever, chills, n/v, sore throat  10. O2 SATURATION MONITOR:  Do you use an oxygen saturation monitor (pulse oximeter) at home? If Yes, ask: What is your reading (oxygen level) today? What is your usual oxygen saturation reading? (e.g., 95%)   97% RA, uses CPAP at night  12. TRAVEL: Have you traveled out of the country in the last month? (e.g., travel history, exposures) no  Protocols used: Breathing Difficulty-A-AH

## 2023-11-19 NOTE — Telephone Encounter (Signed)
Modified

## 2023-11-19 NOTE — Telephone Encounter (Signed)
 Fyi Appt Monday

## 2023-11-22 ENCOUNTER — Encounter: Payer: Self-pay | Admitting: Medical

## 2023-11-22 ENCOUNTER — Ambulatory Visit: Admitting: Medical

## 2023-11-22 VITALS — BP 150/80 | HR 56 | Resp 15 | Ht 75.0 in | Wt 301.2 lb

## 2023-11-22 DIAGNOSIS — R42 Dizziness and giddiness: Secondary | ICD-10-CM

## 2023-11-22 DIAGNOSIS — Z23 Encounter for immunization: Secondary | ICD-10-CM | POA: Diagnosis not present

## 2023-11-22 DIAGNOSIS — Z87891 Personal history of nicotine dependence: Secondary | ICD-10-CM | POA: Diagnosis not present

## 2023-11-22 DIAGNOSIS — H538 Other visual disturbances: Secondary | ICD-10-CM

## 2023-11-22 DIAGNOSIS — I10 Essential (primary) hypertension: Secondary | ICD-10-CM

## 2023-11-22 DIAGNOSIS — Z7984 Long term (current) use of oral hypoglycemic drugs: Secondary | ICD-10-CM

## 2023-11-22 DIAGNOSIS — E1165 Type 2 diabetes mellitus with hyperglycemia: Secondary | ICD-10-CM

## 2023-11-22 DIAGNOSIS — R059 Cough, unspecified: Secondary | ICD-10-CM | POA: Diagnosis not present

## 2023-11-22 DIAGNOSIS — R062 Wheezing: Secondary | ICD-10-CM

## 2023-11-22 DIAGNOSIS — H5789 Other specified disorders of eye and adnexa: Secondary | ICD-10-CM

## 2023-11-22 DIAGNOSIS — E039 Hypothyroidism, unspecified: Secondary | ICD-10-CM

## 2023-11-22 DIAGNOSIS — R0600 Dyspnea, unspecified: Secondary | ICD-10-CM

## 2023-11-22 DIAGNOSIS — N481 Balanitis: Secondary | ICD-10-CM

## 2023-11-22 LAB — COMPLETE METABOLIC PANEL WITHOUT GFR
AG Ratio: 1.9 (calc) (ref 1.0–2.5)
ALT: 21 U/L (ref 9–46)
AST: 20 U/L (ref 10–35)
Albumin: 4.6 g/dL (ref 3.6–5.1)
Alkaline phosphatase (APISO): 40 U/L (ref 35–144)
BUN/Creatinine Ratio: 27 (calc) — ABNORMAL HIGH (ref 6–22)
BUN: 34 mg/dL — ABNORMAL HIGH (ref 7–25)
CO2: 26 mmol/L (ref 20–32)
Calcium: 9.4 mg/dL (ref 8.6–10.3)
Chloride: 102 mmol/L (ref 98–110)
Creat: 1.27 mg/dL (ref 0.70–1.28)
Globulin: 2.4 g/dL (ref 1.9–3.7)
Glucose, Bld: 120 mg/dL — ABNORMAL HIGH (ref 65–99)
Potassium: 5 mmol/L (ref 3.5–5.3)
Sodium: 139 mmol/L (ref 135–146)
Total Bilirubin: 0.9 mg/dL (ref 0.2–1.2)
Total Protein: 7 g/dL (ref 6.1–8.1)

## 2023-11-22 LAB — SPECIMEN COMPROMISED

## 2023-11-22 MED ORDER — LOSARTAN POTASSIUM 100 MG PO TABS: 100.0000 mg | ORAL_TABLET | Freq: Every day | ORAL | 3 refills | Status: AC

## 2023-11-22 MED ORDER — NYSTATIN 100000 UNIT/GM EX CREA: 1.0000 | TOPICAL_CREAM | Freq: Two times a day (BID) | CUTANEOUS | 0 refills | Status: AC

## 2023-11-22 NOTE — Patient Instructions (Addendum)
 Balanitis Redness and minor bleeding at the glans penis. Uncircumcised and diabetic, increasing risk for fungal irritation. - Prescribe nystatin cream to apply twice daily. - Advise to keep the glans penis clean and dry. - Follow up in three weeks to reassess balanitis. - Refer to urologist if redness persists.  Type 2 diabetes mellitus A1c was 7.1 three months ago. Currently on Januvia  and Farxiga . Weight gain noted. Discussed potential to manage with diet and exercise to possibly avoid additional diabetic medication. - Order A1c and metabolic panel to assess current status.  Hypertension Blood pressure recorded at 150/80 mmHg. Currently on losartan  100 mg daily, but ran out of refills. Need to verify adherence to losartan  regimen. - Verify if losartan  100 mg daily has been taken as appears only had 30 tab rx in 2024? - Losartan  100 mg rx refill. - Add other med if confirmed tomorrow has been on losartan . - Follow up in three weeks to reassess blood pressure.  Chronic cough and wheezing Persistent cough and wheezing with smoking pipe for 28 years. Currently using Flovent  and albuterol  inhalers. Previous pulmonary function test showed non-specific pattern, possibly early obstructive or restrictive condition. - Continue Flovent  and albuterol  inhalers. - Refer to pulmonologist for further evaluation. - Order chest x-ray due to persistent productive cough.  Chronic dizziness and balance issues Chronic dizziness with blurred vision and weakness. Previous evaluations by multiple specialists without definitive diagnosis. Differential includes myasthenia gravis, cerebellar atrophy, and mild DeBartman syndrome per recent neurologist but was not given follow up appt?. Previous vestibular and nystagmus testing showed no central cause. - Refer to [facility] for further evaluation. - Advise to call [facility] to schedule appointment. - Instruct to monitor for acute symptoms such as loss of  function, severe headache, or vision changes and seek emergency care if he occurs.  Hypothyroidism TSH was 4.69 in the summer. Currently on thyroid  medication. Previous treatment for thyroid  eye disease with Tepezza , which was discontinued. - Order TSH and T4 tests to assess current thyroid  function.     Sent to [facility]  [address] [place], [place], [place] [address] Phone: [phone]

## 2023-11-22 NOTE — Progress Notes (Signed)
 Subjective:    Patient ID: Michael Whitney, male    DOB: 05/23/1950, 73 y.o.   MRN: [id number]  HPI  Last visit 09/21/2023  Chronic dizziness and vertigo Chronic dizziness persists despite extensive workup. Differential includes myasthenia gravis, cerebellar atrophy, and mild mal debarquement syndrome. - Follow up with neurologist Dr. Camellia Custard at [facility]. - Advise emergency care if dizziness is accompanied by neurologic deficits.   Wheezing and reactive airway disease Intermittent wheezing possibly exacerbated by extreme temperatures. History of smoking and reactive airway disease. Albuterol  provides symptom relief but does not reduce inflammation. - Prescribe Flovent  inhaler, two inhalations twice a day. - Continue albuterol  inhaler as needed. - Consider steroid inhaler if albuterol  is used frequently.   Type 2 diabetes mellitus Type 2 diabetes with recent A1c of 7.1%, indicating moderate control. A1c target between 6.5% and 7% to avoid hypoglycemia. - Continue Januvia  25 mg daily. - Monitor blood glucose levels regularly. - Aim for A1c between 6.5% and 7% while avoiding hypoglycemia.   Atrial fibrillation, rate controlled Atrial fibrillation with rate controlled at 80 bpm. On Xarelto  20 mg for anticoagulation. - Continue Xarelto  20 mg daily.   Hypertension Hypertension with recent home blood pressure readings of 120/70 mmHg. - Continue losartan  100 mg daily. - Monitor blood pressure regularly.   Hypothyroidism Hypothyroidism with recent increase in levothyroxine  to 75 mcg due to elevated TSH and decreased T4. - Continue levothyroxine  75 mcg daily. - Repeat TSH and T4 in late October. - Use moisturizer for dry skin.   Onychomycosis (toenail fungal infection) Onychomycosis with dystrophic and brittle nails. Dry, flaky skin on feet noted. - Refer to podiatrist for nail care and evaluation of skin condition.   Varicose veins of lower extremities Presence of varicose  veins in lower extremities. - Continue wearing compression socks. - Monitor for any changes or symptoms.   General Health Maintenance Discussion of vaccinations including Shingrix and Tdap. Need for updated foot exam noted. - Obtain Shingrix vaccination record from [facility]. - Consider Tdap vaccination if not updated in the last 10 years. - Refer to podiatrist for foot exam.   Follow up October or sooner if needed   Michael Whitney is a 73 year old male with diabetes and reactive airway disease who presents with penile bleeding and respiratory symptoms.  He noticed blood on the skin at base of glands penis approximately one and a half to two weeks ago. The bleeding is located at the base of the glans, not from the urethral opening, and is associated with redness. No significant itching, white discharge, or recent sexual activity. The bleeding was first noticed when he felt something unusual and used a napkin, which revealed a small amount of blood.  He has ongoing respiratory symptoms, including congestion, wheezing, and productive cough with phlegm, persisting since his last visit two months ago. He has a history of smoking a pipe for approximately 28 years, having quit 25-30 years ago. He uses Flovent  and albuterol  inhalers, which provide some relief, but he continues to experience shortness of breath, especially on exertion. He uses a CPAP machine at night.  He experiences chronic dizziness and blurred vision, with a history of evaluations by various specialists, including neurology, ophthalmologist, PT, endocrinologist and ENT, without a definitive diagnosis. He has undergone vestibular testing and was previously considered for conditions such as myasthenia gravis and cerebellar atrophy. He has tried Ecologist and prism  glasses in the past without significant improvement. This has been chronic  issue for years even before became patient of our office.  He is currently on thyroid   medication, with a TSH level of 4.69 in the summer. He was previously treated with Tepezza  for thyroid  eye disease, which was discontinued after reaching the maximum allowable dose.  Review of Systems  Constitutional:  Negative for chills, fatigue and fever.  HENT:  Negative for congestion, hearing loss and postnasal drip.   Respiratory:  Negative for cough, chest tightness, wheezing and stridor.   Cardiovascular:  Negative for chest pain and palpitations.  Gastrointestinal:  Negative for abdominal pain, blood in stool and rectal pain.  Genitourinary:  Negative for dysuria, frequency, penile pain and urgency.  Musculoskeletal:  Negative for back pain, joint swelling and neck pain.       See hpi  Skin:  Negative for rash.  Neurological:  Positive for dizziness. Negative for syncope, weakness and light-headedness.       Chronic dizziness  Hematological:  Negative for adenopathy.  Psychiatric/Behavioral:  Negative for behavioral problems and hallucinations. The patient is not nervous/anxious.     Past Medical History:  Diagnosis Date   Atrial fibrillation (HCC)    Cataract    Diabetes mellitus without complication (HCC)    Hypertension    Seizures (HCC)    Thought to be related to something his GF gave him     Social History   Socioeconomic History   Marital status: Widowed    Spouse name: Not on file   Number of children: 2   Years of education: Not on file   Highest education level: Not on file  Occupational History   Occupation: retired    Comment: Environmental manager  Tobacco Use   Smoking status: Former    Types: Pipe   Smokeless tobacco: Never   Tobacco comments:    Quit 30 years ago.  Vaping Use   Vaping status: Never Used  Substance and Sexual Activity   Alcohol use: Yes    Alcohol/week: 2.0 standard drinks of alcohol    Types: 1 Shots of liquor, 1 Cans of beer per week    Comment: occasionally   Drug use: No   Sexual activity: Not on file  Other Topics Concern    Not on file  Social History Narrative   Not on file   Social Drivers of Health   Financial Resource Strain: Low Risk  (08/24/2023)   Overall Financial Resource Strain (CARDIA)    Difficulty of Paying Living Expenses: Not hard at all  Food Insecurity: No Food Insecurity (08/24/2023)   Hunger Vital Sign    Worried About Running Out of Food in the Last Year: Never true    Ran Out of Food in the Last Year: Never true  Transportation Needs: No Transportation Needs (08/24/2023)   PRAPARE - Administrator, Civil Service (Medical): No    Lack of Transportation (Non-Medical): No  Physical Activity: Sufficiently Active (08/24/2023)   Exercise Vital Sign    Days of Exercise per Week: 5 days    Minutes of Exercise per Session: 70 min  Stress: Stress Concern Present (08/24/2023)   [employer] of Occupational Health - Occupational Stress Questionnaire    Feeling of Stress: To some extent  Social Connections: Socially Isolated (08/24/2023)   Social Connection and Isolation Panel    Frequency of Communication with Friends and Family: More than three times a week    Frequency of Social Gatherings with Friends and Family: Once a week  Attends Religious Services: Never    Active Member of Clubs or Organizations: No    Attends Banker Meetings: Never    Marital Status: Widowed  Intimate Partner Violence: Not At Risk (08/24/2023)   Humiliation, Afraid, Rape, and Kick questionnaire    Fear of Current or Ex-Partner: No    Emotionally Abused: No    Physically Abused: No    Sexually Abused: No    Past Surgical History:  Procedure Laterality Date   CATARACT EXTRACTION     PVC ABLATION N/A 07/14/2017   Procedure: PVC ABLATION;  Surgeon: Waddell Danelle ORN, MD;  Location: [facility] INVASIVE CV LAB;  Service: Cardiovascular;  Laterality: N/A;   RIGHT/LEFT HEART CATH AND CORONARY ANGIOGRAPHY N/A 10/24/2020   Procedure: RIGHT/LEFT HEART CATH AND CORONARY ANGIOGRAPHY;  Surgeon: Swaziland, Peter M,  MD;  Location: [facility] INVASIVE CV LAB;  Service: Cardiovascular;  Laterality: N/A;   TONSILLECTOMY     VARICOSE VEIN SURGERY      Family History  Problem Relation Age of Onset   CAD Mother        MI at age 35    Allergies  Allergen Reactions   Amlodipine  Other (See Comments)    Dizziness  dizziness   Metformin And Related Diarrhea    Current Outpatient Medications on File Prior to Visit  Medication Sig Dispense Refill   Accu-Chek FastClix Lancets MISC Use 1 lancet daily to test blood sugar (E11.9)     albuterol  (VENTOLIN  HFA) 108 (90 Base) MCG/ACT inhaler Inhale 2 puffs into the lungs every 6 (six) hours as needed for wheezing or shortness of breath. 18 g 5   ammonium lactate  (LAC-HYDRIN ) 12 % lotion Apply 1 Application topically as needed for dry skin. 400 g 0   azelastine  (ASTELIN ) 0.1 % nasal spray Place 2 sprays into both nostrils 2 (two) times daily. Use in each nostril as directed 30 mL 0   beclomethasone (QVAR) 40 MCG/ACT inhaler Inhale 2 puffs into the lungs 2 (two) times daily. 1 each 2   benzonatate  (TESSALON ) 100 MG capsule Take 1 capsule (100 mg total) by mouth 3 (three) times daily as needed for cough. 30 capsule 0   cetirizine  (ZYRTEC ) 10 MG tablet Take 1 tablet (10 mg total) by mouth 2 (two) times daily. 60 tablet 3   Cholecalciferol (VITAMIN D -3) 25 MCG (1000 UT) CAPS Take 1,000 Units by mouth daily.     clonazePAM  (KLONOPIN ) 0.5 MG tablet Take 1 tablet (0.5 mg total) by mouth 2 (two) times daily as needed for anxiety. 10 tablet 0   dapagliflozin  propanediol (FARXIGA ) 10 MG TABS tablet Take 1 tablet (10 mg total) by mouth daily before breakfast. 30 tablet 1   famotidine  (PEPCID ) 20 MG tablet TAKE 1 TABLET(20 MG) BY MOUTH TWICE DAILY 60 tablet 3   fluticasone  (FLONASE ) 50 MCG/ACT nasal spray Place 2 sprays into both nostrils daily. 48 g 0   hydrOXYzine  (VISTARIL ) 25 MG capsule TAKE 1 CAPSULE(25 MG) BY MOUTH AT BEDTIME AS NEEDED 30 capsule 0   levothyroxine  (SYNTHROID ) 75  MCG tablet Take 1 tablet (75 mcg total) by mouth daily. 90 tablet 3   mometasone  (ELOCON ) 0.1 % cream Apply topically daily as needed. 45 g 1   Multiple Vitamin (MULTI-VITAMIN) tablet Take 1 tablet by mouth daily.     Omega-3 Fatty Acids (FISH OIL) 1000 MG CAPS Take 1,000 mg by mouth daily.     ONETOUCH VERIO test strip 1 each by Other route as needed.  OVER THE COUNTER MEDICATION Take 6 drops by mouth daily. Vitamins A, D, and K     sitaGLIPtin  (JANUVIA ) 25 MG tablet Take 1 tablet (25 mg total) by mouth daily. 90 tablet 3   vitamin B-12 (CYANOCOBALAMIN ) 1000 MCG tablet Take 1,000 mcg by mouth daily.     Vitamin D , Ergocalciferol , (DRISDOL ) 1.25 MG (50000 UNIT) CAPS capsule Take 1 capsule (50,000 Units total) by mouth every 7 (seven) days. 5 capsule 12   XARELTO  20 MG TABS tablet TAKE 1 TABLET(20 MG) BY MOUTH DAILY WITH SUPPER 90 tablet 3   No current facility-administered medications on file prior to visit.    BP (!) 150/80   Pulse (!) 56   Resp 15   Ht 6' 3 (1.905 m)   Wt (!) 301 lb 3.2 oz (136.6 kg)   SpO2 96%   BMI 37.65 kg/m        Objective:   Physical Exam   General Mental Status- Alert. General Appearance- Not in acute distress.    Skin General: Color- Normal Color. Moisture- Normal Moisture.   Neck Carotid Arteries- Normal color. Moisture- Normal Moisture. No carotid bruits. No JVD.   Chest and Lung Exam Auscultation: Breath Sounds:-CTA except mild expiratory wheeze at bases.   Cardiovascular Auscultation:Rythm- Regular. Murmurs & Other Heart Sounds:Auscultation of the heart reveals- No Murmurs.   Abdomen Inspection:-Inspeection Normal. Palpation/Percussion:Note:No mass. Palpation and Percussion of the abdomen reveal- Non Tender, Non Distended + BS, no rebound or guarding.     Neurologic Cranial Nerve exam:- CN III-XII intact(No nystagmus), symmetric smile. Strength:- 5/5 equal and symmetric strength both upper and lower extremities.      Lower  ext- see quality metrics. Calfs symmetric, no edema. Negative homans signs.     Assessment & Plan:   Patient Instructions  Balanitis Redness and minor bleeding at the glans penis. Uncircumcised and diabetic, increasing risk for fungal irritation. - Prescribe nystatin cream to apply twice daily. - Advise to keep the glans penis clean and dry. - Follow up in three weeks to reassess balanitis. - Refer to urologist if redness persists.  Type 2 diabetes mellitus A1c was 7.1 three months ago. Currently on Januvia  and Farxiga . Weight gain noted. Discussed potential to manage with diet and exercise to possibly avoid additional diabetic medication. - Order A1c and metabolic panel to assess current status.  Hypertension Blood pressure recorded at 150/80 mmHg. Currently on losartan  100 mg daily, but ran out of refills. Need to verify adherence to losartan  regimen. - Verify if losartan  100 mg daily has been taken as appears only had 30 tab rx in 2024? - Losartan  100 mg rx refill. - Add other med if confirmed tomorrow has been on losartan . - Follow up in three weeks to reassess blood pressure.  Chronic cough and wheezing Persistent cough and wheezing with smoking pipe for 28 years. Currently using Flovent  and albuterol  inhalers. Previous pulmonary function test showed non-specific pattern, possibly early obstructive or restrictive condition. - Continue Flovent  and albuterol  inhalers. - Refer to pulmonologist for further evaluation. - Order chest x-ray due to persistent productive cough.  Chronic dizziness and balance issues Chronic dizziness with blurred vision and weakness. Previous evaluations by multiple specialists without definitive diagnosis. Differential includes myasthenia gravis, cerebellar atrophy, and mild DeBartman syndrome per recent neurologist but was not given follow up appt?. Previous vestibular and nystagmus testing showed no central cause. - Refer to [facility] for further  evaluation. - Advise to call [facility] to schedule appointment. -  Instruct to monitor for acute symptoms such as loss of function, severe headache, or vision changes and seek emergency care if he occurs.  Hypothyroidism TSH was 4.69 in the summer. Currently on thyroid  medication. Previous treatment for thyroid  eye disease with Tepezza , which was discontinued. - Order TSH and T4 tests to assess current thyroid  function.     Sent to [facility]  [address] [place], [place], [place] [address] Phone: [phone]    Dallas Maxwell, PA-C   I personally spent a total of 43 minutes in the care of the patient today including getting/reviewing separately obtained history, performing a medically appropriate exam/evaluation, counseling and educating, placing orders, referring and communicating with other health care professionals, and documenting clinical information in the EHR.

## 2023-11-23 ENCOUNTER — Ambulatory Visit: Payer: Self-pay | Admitting: Medical

## 2023-11-23 LAB — HEMOGLOBIN A1C: Hgb A1c MFr Bld: 7.3 % — ABNORMAL HIGH (ref 4.6–6.5)

## 2023-11-23 LAB — T4, FREE: Free T4: 0.72 ng/dL (ref 0.60–1.60)

## 2023-11-23 LAB — TSH: TSH: 4.05 u[IU]/mL (ref 0.35–5.50)

## 2023-12-02 ENCOUNTER — Ambulatory Visit: Payer: Self-pay | Admitting: Adult Health

## 2023-12-02 ENCOUNTER — Encounter: Payer: Self-pay | Admitting: Adult Health

## 2023-12-02 ENCOUNTER — Ambulatory Visit (INDEPENDENT_AMBULATORY_CARE_PROVIDER_SITE_OTHER): Admitting: Adult Health

## 2023-12-02 ENCOUNTER — Ambulatory Visit (INDEPENDENT_AMBULATORY_CARE_PROVIDER_SITE_OTHER)

## 2023-12-02 VITALS — BP 151/79 | HR 49 | Ht 75.0 in | Wt 285.0 lb

## 2023-12-02 DIAGNOSIS — J309 Allergic rhinitis, unspecified: Secondary | ICD-10-CM

## 2023-12-02 DIAGNOSIS — G4733 Obstructive sleep apnea (adult) (pediatric): Secondary | ICD-10-CM | POA: Diagnosis not present

## 2023-12-02 DIAGNOSIS — J209 Acute bronchitis, unspecified: Secondary | ICD-10-CM

## 2023-12-02 DIAGNOSIS — R058 Other specified cough: Secondary | ICD-10-CM | POA: Diagnosis not present

## 2023-12-02 DIAGNOSIS — J40 Bronchitis, not specified as acute or chronic: Secondary | ICD-10-CM | POA: Diagnosis not present

## 2023-12-02 DIAGNOSIS — R918 Other nonspecific abnormal finding of lung field: Secondary | ICD-10-CM | POA: Diagnosis not present

## 2023-12-02 MED ORDER — TRELEGY ELLIPTA 100-62.5-25 MCG/ACT IN AEPB: 1.0000 | INHALATION_SPRAY | Freq: Every day | RESPIRATORY_TRACT | Status: AC

## 2023-12-02 MED ORDER — AZITHROMYCIN 250 MG PO TABS: ORAL_TABLET | ORAL | 0 refills | Status: AC

## 2023-12-02 MED ORDER — PREDNISONE 20 MG PO TABS: 20.0000 mg | ORAL_TABLET | Freq: Every day | ORAL | 0 refills | Status: DC

## 2023-12-02 NOTE — Patient Instructions (Addendum)
 Continue on BIPAP At bedtime, wear all night long  Healthy sleep regimen  Do not drive if sleepy  Work on healthy weight loss   Zpack take as directed Prednisone  20mg  daily for 5 days.  Begin Trelegy 1 puff daily -finish sample when done resume QVAR 2 puffs Twice daily  ,rinse after use.  Albuterol  inhaler As needed   May use Zyrtec  daily As needed   May use Astelin  and Flonase  daily as needed.  Follow up in 6 months with Dr. Kara or Loel Betancur NP and As needed

## 2023-12-02 NOTE — Progress Notes (Signed)
 @Patient  ID: Michael Whitney, male    DOB: 1951-02-21, 73 y.o.   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  History of Present Illness Michael Whitney is a 73 year old male who presents with wheezing and sinus drainage.  He has been experiencing wheezing and sinus drainage for the past three to four weeks. He uses an albuterol  inhaler prescribed by his primary care physician, which provides minimal relief. He has a history of allergies and takes Zyrtec  and Astelin . He also experiences postnasal drip and sinus drainage.  He uses a CPAP  machine with nasal pillows every night,. Recently got new CPAP  machine which he finds quieter and more comfortable than his previous device, although he experiences nasal soreness in the morning.  Patient says he feels he is benefiting from CPAP Feels more rested when he uses his  CPAP .  CPAP download shows a excellent compliance with daily average usage at 8 hours.  Patient is on auto CPAP 8 to 20 cm H2O.  AHI is 6.7/hour daily average pressure at 13.9 cm H2O.  He has a history of Hashimoto's disease, which he believes contributes to his weight gain and visual disturbances. He experiences double vision, especially in bright sunlight, affecting his balance. He has been evaluated by multiple specialists without a definitive diagnosis and is awaiting  an appointment at [facility] for further evaluation. He has not driven for some time due to these visual issues and uses a ride service provided by his insurance.  He has a history of atrial fibrillation, which he attributes to his thyroid  condition. An attempted cardioversion  was unsuccessful He has not smoked for over thirty years and denies any current tobacco use.     Allergies  Allergen Reactions   Amlodipine  Other (See Comments)    Dizziness  dizziness   Metformin And Related Diarrhea    Immunization History  Administered Date(s) Administered   Fluad Quad(high Dose 65+) 11/29/2020, 12/23/2022   Fluzone Influenza virus vaccine,trivalent (IIV3), split virus 12/23/2018   INFLUENZA, HIGH DOSE SEASONAL PF 11/08/2017, 10/23/2018, 11/22/2023   Influenza Whole 12/23/2018   Influenza,inj,Quad PF,6+ Mos 01/23/2015, 11/20/2016   Influenza-Unspecified 01/23/2015, 11/26/2015, 11/26/2015, 11/20/2016, 10/23/2018   PFIZER(Purple Top)SARS-COV-2 Vaccination 04/01/2019, 04/26/2019   Pfizer Covid-19 Vaccine Bivalent Booster 43yrs & up 11/29/2020   Pneumococcal Conjugate-13 02/14/2016, 02/14/2016   Pneumococcal Polysaccharide-23 01/23/2015, 01/23/2015, 08/12/2018    Past Medical History:  Diagnosis Date   Atrial fibrillation (HCC)    Cataract    Diabetes mellitus without complication (HCC)    Hypertension    Seizures (HCC)    Thought to be related to something his GF gave him    Tobacco History: Social History   Tobacco Use  Smoking Status Former   Types: Pipe  Smokeless Tobacco Never  Tobacco Comments   Quit 30 years ago.   Counseling given: Not Answered Tobacco comments:  Quit 30 years ago.   Outpatient Medications Prior to Visit  Medication Sig Dispense Refill   Accu-Chek FastClix Lancets MISC Use 1 lancet daily to test blood sugar (E11.9)     albuterol  (VENTOLIN  HFA) 108 (90 Base) MCG/ACT inhaler Inhale 2 puffs into the lungs every 6 (six) hours as needed for wheezing or  shortness of breath. 18 g 5   ammonium lactate  (LAC-HYDRIN ) 12 % lotion Apply 1 Application topically as needed for dry skin. 400 g 0   azelastine  (ASTELIN ) 0.1 % nasal spray Place 2 sprays into both nostrils 2 (two) times daily. Use in each nostril as directed 30 mL 0   beclomethasone (QVAR) 40 MCG/ACT inhaler Inhale 2 puffs into the lungs 2 (two) times daily. 1 each 2   benzonatate  (TESSALON ) 100 MG capsule Take 1 capsule (100 mg total) by mouth 3 (three) times daily as needed for cough. 30 capsule 0   cetirizine  (ZYRTEC ) 10 MG tablet Take 1 tablet (10 mg total) by mouth 2 (two) times daily. 60 tablet 3   Cholecalciferol (VITAMIN D -3) 25 MCG (1000 UT) CAPS Take 1,000 Units by mouth daily.     clonazePAM  (KLONOPIN ) 0.5 MG tablet Take 1 tablet (0.5 mg total) by mouth 2 (two) times daily as needed for anxiety. 10 tablet 0   dapagliflozin  propanediol (FARXIGA ) 10 MG TABS tablet Take 1 tablet (10 mg total) by mouth daily before breakfast. 30 tablet 1   famotidine  (PEPCID ) 20 MG tablet TAKE 1 TABLET(20 MG) BY MOUTH TWICE DAILY 60 tablet 3   fluticasone  (FLONASE ) 50 MCG/ACT nasal spray Place 2 sprays into both nostrils daily. 48 g 0   hydrOXYzine  (VISTARIL ) 25 MG capsule TAKE 1 CAPSULE(25 MG) BY MOUTH AT BEDTIME AS NEEDED 30 capsule 0   levothyroxine  (SYNTHROID ) 75 MCG tablet Take 1 tablet (75 mcg total) by mouth daily. 90 tablet 3   losartan  (COZAAR ) 100 MG tablet Take 1 tablet (100 mg total) by mouth daily. 90 tablet 3   mometasone  (ELOCON ) 0.1 % cream Apply topically daily as needed. 45 g 1   Multiple Vitamin (MULTI-VITAMIN) tablet Take 1 tablet by mouth daily.     nystatin cream (MYCOSTATIN) Apply 1 Application topically 2 (two) times daily. 30 g 0   Omega-3 Fatty Acids (FISH OIL) 1000 MG CAPS Take 1,000 mg by mouth daily.     ONETOUCH VERIO test strip 1 each by Other route as needed.     OVER THE COUNTER MEDICATION Take 6 drops by mouth daily. Vitamins A, D, and K     sitaGLIPtin  (JANUVIA ) 25 MG  tablet Take 1 tablet (25 mg total) by mouth daily. 90 tablet 3   vitamin B-12 (CYANOCOBALAMIN ) 1000 MCG tablet Take 1,000 mcg by mouth daily.     Vitamin D , Ergocalciferol , (DRISDOL ) 1.25 MG (50000 UNIT) CAPS capsule Take 1 capsule (50,000 Units total) by mouth every 7 (seven) days. 5 capsule 12   XARELTO  20 MG TABS tablet TAKE 1 TABLET(20 MG) BY MOUTH DAILY WITH SUPPER 90 tablet 3   No facility-administered medications prior to visit.     Review of Systems:   Constitutional:   No  weight loss, night sweats,  Fevers, chills,+ fatigue, or  lassitude.  HEENT:   No headaches,  Difficulty swallowing,  Tooth/dental problems, or  Sore throat,                No sneezing, itching, ear ache, nasal congestion, post nasal drip,   CV:  No chest  pain,  Orthopnea, PND, swelling in lower extremities, anasarca, dizziness, palpitations, syncope.   GI  No heartburn, indigestion, abdominal pain, nausea, vomiting, diarrhea, change in bowel habits, loss of appetite, bloody stools.   Resp:  No chest wall deformity  Skin: no rash or lesions.  GU: no dysuria, change in color of urine, no urgency or frequency.  No flank pain, no hematuria   MS:  No joint pain or swelling.  No decreased range of motion.  No back pain.    Physical Exam  BP (!) 163/86   Pulse (!) 49   Ht 6' 3 (1.905 m)   Wt 285 lb (129.3 kg)   SpO2 95% Comment: RA  BMI 35.62 kg/m   GEN: A/Ox3; pleasant , NAD, well nourished chronically ill-appearing in wheelchair   HEENT:  Branch/AT,  , NOSE-clear, THROAT-clear, no lesions, no postnasal drip or exudate noted.   NECK:  Supple w/ fair ROM; no JVD; normal carotid impulses w/o bruits; no thyromegaly or nodules palpated; no lymphadenopathy.    RESP  Clear  P & A; w/o, wheezes/ rales/ or rhonchi. no accessory muscle use, no dullness to percussion  CARD:  RRR, no m/r/g, r peripheral edema, pulses intact, no cyanosis or clubbing.  GI:   Soft & nt; nml bowel sounds; no organomegaly or  masses detected.   Musco: Warm bil, no deformities or joint swelling noted.   Neuro: alert, no focal deficits noted.    Skin: Warm, no lesions or rashes    Lab Results:  CBC    Imaging: No results found.  Administration History     None          Latest Ref Rng & Units 04/23/2021   12:39 PM  PFT Results  FVC-Pre L 3.01   FVC-Predicted Pre % 57   FVC-Post L 3.22   FVC-Predicted Post % 61   Pre FEV1/FVC % % 72   Post FEV1/FCV % % 74   FEV1-Pre L 2.17   FEV1-Predicted Pre % 56   FEV1-Post L 2.40   DLCO uncorrected ml/min/mmHg 27.76   DLCO UNC% % 93   DLCO corrected ml/min/mmHg 26.63   DLCO COR %Predicted % 89   DLVA Predicted % 112   TLC L 6.74   TLC % Predicted % 83   RV % Predicted % 124     No results found for: NITRICOXIDE      Assessment & Plan:   Assessment and Plan Assessment & Plan Acute bronchitis with wheezing and productive cough Wheezing and productive cough have persisted for 3-4 weeks without a recent cold, suggesting possible bronchitis No antibiotics were previously prescribed. Order a chest x-ray to evaluate for pneumonia or bronchitis. Prescribe a Z-Pak for possible sinus infection or bronchitis and low-dose prednisone  for wheezing. Provide a sample of Trelegy inhaler, one puff daily, with instructions to rinse mouth after use. Instruct to resume Qvar inhaler after finishing the Trelegy sample.  Allergic rhinitis   Allergic rhinitis is contributing to sinus drainage and possibly exacerbating respiratory symptoms. He is currently on Zyrtec , Astelin , and Flonase  for allergy management.  Obstructive sleep apnea managed with CPAP  Excellent control and compliance on CPAP.  CPAP care discussed in detail.  Continue on current settings   Dizziness and visual changes  -continue follow up with PCP Follow up with  [facility] appointment for further evaluation.   Plan  Patient Instructions  Continue on BIPAP At bedtime, wear all night long   Healthy sleep regimen  Do not drive if sleepy  Work on healthy weight loss   Zpack take as directed Prednisone  20mg  daily for 5 days.  Begin Trelegy 1 puff daily -finish sample when done resume QVAR 2 puffs Twice daily  ,rinse after use.  Albuterol  inhaler As needed   May use Zyrtec  daily As needed   May use Astelin  and Flonase  daily as needed.  Follow up in 6 months with Dr. Kara or Korri Ask NP and As needed          Michael Stank, NP 12/02/2023

## 2023-12-04 ENCOUNTER — Other Ambulatory Visit: Payer: Self-pay | Admitting: Medical

## 2023-12-19 DIAGNOSIS — G4733 Obstructive sleep apnea (adult) (pediatric): Secondary | ICD-10-CM | POA: Diagnosis not present

## 2023-12-21 ENCOUNTER — Ambulatory Visit: Payer: Self-pay

## 2023-12-21 NOTE — Telephone Encounter (Signed)
 FYI Only or Action Required?: Action required by provider: update on patient condition.  Patient was last seen in primary care on 11/22/2023 by Dorina Loving, PA-C.  Called Nurse Triage reporting Dizziness.  Symptoms began states never stops.  Interventions attempted: Nothing.  Symptoms are: gradually worsening.  Triage Disposition: Go to ED Now (Notify PCP)  Patient/caregiver understands and will follow disposition?: No, wishes to speak with PCPCopied from CRM [phone]. Topic: Clinical - Red Word Triage >> Dec 21, 2023  1:47 PM Pinkey ORN wrote: Red Word that prompted transfer to Nurse Triage: Worsening Pressure In Ears >> Dec 21, 2023  1:48 PM Pinkey ORN wrote: Patient states when he stands, he feels off-balance and begin swaying. Patient also mentions that this eyes aren't focusing and he is experiencing ringing in his ears as well as worsening pressure. Reason for Disposition  Loss of vision or double vision  (Exception: Similar to previous migraines.)  Answer Assessment - Initial Assessment Questions 1. DESCRIPTION: Describe your dizziness.     Swaying when standing, double and triple vision, states speech is normal 5. ONSET:  When did the dizziness begin?     It never stops,definitely feels worse today  Pt advised to go to ED, states that he will not go back to ED for this, states he has been evaluated in the ED for this numerous times. Pt states that he cannot get into [facility] until next year. Pt states that he was calling to let PCP know that it is occurring again. Pt states he would like to know if PCP has pull to get him in sooner at [facility]. Pt would appreciate a call back after PCP reviews. Pt has also been seen pulm and pt was dx with bronchitis, pt states he feels better.  Protocols used: Dizziness - Lightheadedness-A-AH

## 2023-12-22 NOTE — Telephone Encounter (Signed)
 Pt scheduled for 10/31 40 min slot

## 2023-12-24 ENCOUNTER — Ambulatory Visit (HOSPITAL_BASED_OUTPATIENT_CLINIC_OR_DEPARTMENT_OTHER)
Admission: RE | Admit: 2023-12-24 | Discharge: 2023-12-24 | Disposition: A | Discharge status: Home / Self Care | Source: Ambulatory Visit | Attending: Medical | Admitting: Medical

## 2023-12-24 ENCOUNTER — Ambulatory Visit: Admitting: Medical

## 2023-12-24 VITALS — BP 140/70 | HR 56 | Temp 97.6°F | Resp 14 | Ht 75.0 in | Wt 298.4 lb

## 2023-12-24 DIAGNOSIS — H539 Unspecified visual disturbance: Secondary | ICD-10-CM | POA: Diagnosis not present

## 2023-12-24 DIAGNOSIS — I6523 Occlusion and stenosis of bilateral carotid arteries: Secondary | ICD-10-CM | POA: Diagnosis not present

## 2023-12-24 DIAGNOSIS — H814 Vertigo of central origin: Secondary | ICD-10-CM | POA: Insufficient documentation

## 2023-12-24 DIAGNOSIS — H532 Diplopia: Secondary | ICD-10-CM | POA: Insufficient documentation

## 2023-12-24 DIAGNOSIS — I6782 Cerebral ischemia: Secondary | ICD-10-CM | POA: Diagnosis not present

## 2023-12-24 DIAGNOSIS — R42 Dizziness and giddiness: Secondary | ICD-10-CM | POA: Diagnosis not present

## 2023-12-24 NOTE — Progress Notes (Signed)
 Subjective:    Patient ID: Michael Whitney, male    DOB: 1950/03/29, 73 y.o.   MRN: [id number]  HPI  Michael Whitney is a 73 year old male with chronic dizziness and vision issues who presents with worsening symptoms.  He has experienced chronic dizziness for years, with recent worsening. The dizziness feels like standing on a board on a ball, causing swaying and imbalance. No slurred speech, vomiting, or nausea. Head movements exacerbate the dizziness, but no vertigo or spinning is present.  He has chronic vision issues, including double vision, which has worsened recently. The double vision became more pronounced over the past week, starting around the weekend prior to the visit. He associates some dizziness with his vision problems.  He has been evaluated by various specialists, including ENTs, endocrinologists, ophthalmologists, neurologists, and physical therapists, but no definitive cause for his symptoms has been identified. 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  He is currently on levothyroxine , with recent lab results showing TSH and T4 levels within normal range after an incremental dose change four months ago. Despite this, he continues to experience symptoms such as dry skin and believes his body is 'attacking' him.  He has an appointment scheduled with a neurologist in April of next year, following a referral for neuro-ophthalmology evaluation due to diplopia and dizziness. He is on a cancellation list for an earlier appointment.  Pt called earlier this week and talked with triage nurse based on signs/symptoms triaged and advised to be seen in the ED. Pt refused to be se in ED and now seeing pt on Friday afternoon late. I explained ED evaluation based on his signs/symptoms was the correct advise and  that even now most conservative advise. Pt declined ED evaluation and therefore decided to order ct head stat.(Discussed with prior auth coordinator and she states no prior auth needed for pt)       Review of Systems  Eyes:  Positive for visual disturbance.       Worse vision changes  Respiratory:  Negative for choking and shortness of breath.   Cardiovascular:  Negative for leg swelling.  Genitourinary:  Negative for flank pain, genital sores and hematuria.  Musculoskeletal:  Negative for back pain.  Skin:  Negative for rash.  Neurological:  Positive for dizziness. Negative for speech difficulty and weakness.  Hematological:  Negative for adenopathy.  Psychiatric/Behavioral:  Negative for confusion, hallucinations and suicidal ideas. The patient is not nervous/anxious.    Past Medical History:  Diagnosis Date   Atrial fibrillation (HCC)    Cataract    Diabetes mellitus without complication (HCC)    Hypertension    Seizures (HCC)    Thought to be related to something his GF gave him     Social History   Socioeconomic History   Marital status: Widowed    Spouse name: Not on file   Number of children: 2   Years of education: Not on file   Highest education level: Not on file  Occupational History   Occupation: retired    Comment: Environmental Manager  Tobacco Use   Smoking status: Former    Types: Pipe   Smokeless tobacco: Never   Tobacco comments:    Quit 30 years ago.  Vaping Use   Vaping status: Never Used  Substance and Sexual Activity  Alcohol use: Yes    Alcohol/week: 2.0 standard drinks of alcohol    Types: 1 Shots of liquor, 1 Cans of beer per week    Comment: occasionally   Drug use: No   Sexual activity: Not on file  Other Topics Concern   Not on file  Social History Narrative   Not on file   Social Drivers of Health   Financial Resource Strain: Low Risk  (08/24/2023)   Overall Financial Resource Strain (CARDIA)    Difficulty of Paying Living Expenses:  Not hard at all  Food Insecurity: No Food Insecurity (08/24/2023)   Hunger Vital Sign    Worried About Running Out of Food in the Last Year: Never true    Ran Out of Food in the Last Year: Never true  Transportation Needs: No Transportation Needs (08/24/2023)   PRAPARE - Administrator, Civil Service (Medical): No    Lack of Transportation (Non-Medical): No  Physical Activity: Sufficiently Active (08/24/2023)   Exercise Vital Sign    Days of Exercise per Week: 5 days    Minutes of Exercise per Session: 70 min  Stress: Stress Concern Present (08/24/2023)   [employer] of Occupational Health - Occupational Stress Questionnaire    Feeling of Stress: To some extent  Social Connections: Socially Isolated (08/24/2023)   Social Connection and Isolation Panel    Frequency of Communication with Friends and Family: More than three times a week    Frequency of Social Gatherings with Friends and Family: Once a week    Attends Religious Services: Never    Database Administrator or Organizations: No    Attends Banker Meetings: Never    Marital Status: Widowed  Intimate Partner Violence: Not At Risk (08/24/2023)   Humiliation, Afraid, Rape, and Kick questionnaire    Fear of Current or Ex-Partner: No    Emotionally Abused: No    Physically Abused: No    Sexually Abused: No    Past Surgical History:  Procedure Laterality Date   CATARACT EXTRACTION     PVC ABLATION N/A 07/14/2017   Procedure: PVC ABLATION;  Surgeon: Michael Danelle ORN, Whitney;  Location: [facility] INVASIVE CV LAB;  Service: Cardiovascular;  Laterality: N/A;   RIGHT/LEFT HEART CATH AND CORONARY ANGIOGRAPHY N/A 10/24/2020   Procedure: RIGHT/LEFT HEART CATH AND CORONARY ANGIOGRAPHY;  Surgeon: Michael Whitney;  Location: [facility] INVASIVE CV LAB;  Service: Cardiovascular;  Laterality: N/A;   TONSILLECTOMY     VARICOSE VEIN SURGERY      Family History  Problem Relation Age of Onset   CAD Mother        MI at age 53     Allergies  Allergen Reactions   Amlodipine  Other (See Comments)    Dizziness  dizziness   Metformin And Related Diarrhea    Current Outpatient Medications on File Prior to Visit  Medication Sig Dispense Refill   Accu-Chek FastClix Lancets MISC Use 1 lancet daily to test blood sugar (E11.9)     albuterol  (VENTOLIN  HFA) 108 (90 Base) MCG/ACT inhaler Inhale 2 puffs into the lungs every 6 (six) hours as needed for wheezing or shortness of breath. 18 g 5   ammonium lactate  (LAC-HYDRIN ) 12 % lotion Apply 1 Application topically as needed for dry skin. 400 g 0   azelastine  (ASTELIN ) 0.1 % nasal spray Place 2 sprays into both nostrils 2 (two) times daily. Use in each nostril as directed 30 mL 0   beclomethasone (QVAR  REDIHALER) 40 MCG/ACT inhaler INHALE 2 PUFFS INTO THE LUNGS TWICE DAILY 10.6 g 1   benzonatate  (TESSALON ) 100 MG capsule Take 1 capsule (100 mg total) by mouth 3 (three) times daily as needed for cough. 30 capsule 0   cetirizine  (ZYRTEC ) 10 MG tablet Take 1 tablet (10 mg total) by mouth 2 (two) times daily. 60 tablet 3   Cholecalciferol (VITAMIN D -3) 25 MCG (1000 UT) CAPS Take 1,000 Units by mouth daily.     clonazePAM  (KLONOPIN ) 0.5 MG tablet Take 1 tablet (0.5 mg total) by mouth 2 (two) times daily as needed for anxiety. 10 tablet 0   dapagliflozin  propanediol (FARXIGA ) 10 MG TABS tablet Take 1 tablet (10 mg total) by mouth daily before breakfast. 30 tablet 1   famotidine  (PEPCID ) 20 MG tablet TAKE 1 TABLET(20 MG) BY MOUTH TWICE DAILY 60 tablet 3   fluticasone  (FLONASE ) 50 MCG/ACT nasal spray Place 2 sprays into both nostrils daily. 48 g 0   Fluticasone -Umeclidin-Vilant (TRELEGY ELLIPTA) 100-62.5-25 MCG/ACT AEPB Inhale 1 puff into the lungs daily.     hydrOXYzine  (VISTARIL ) 25 MG capsule TAKE 1 CAPSULE(25 MG) BY MOUTH AT BEDTIME AS NEEDED 30 capsule 0   levothyroxine  (SYNTHROID ) 75 MCG tablet Take 1 tablet (75 mcg total) by mouth daily. 90 tablet 3   losartan  (COZAAR ) 100 MG  tablet Take 1 tablet (100 mg total) by mouth daily. 90 tablet 3   mometasone  (ELOCON ) 0.1 % cream Apply topically daily as needed. 45 g 1   Multiple Vitamin (MULTI-VITAMIN) tablet Take 1 tablet by mouth daily.     nystatin cream (MYCOSTATIN) Apply 1 Application topically 2 (two) times daily. 30 g 0   Omega-3 Fatty Acids (FISH OIL) 1000 MG CAPS Take 1,000 mg by mouth daily.     ONETOUCH VERIO test strip 1 each by Other route as needed.     OVER THE COUNTER MEDICATION Take 6 drops by mouth daily. Vitamins A, D, and K     sitaGLIPtin  (JANUVIA ) 25 MG tablet Take 1 tablet (25 mg total) by mouth daily. 90 tablet 3   vitamin B-12 (CYANOCOBALAMIN ) 1000 MCG tablet Take 1,000 mcg by mouth daily.     Vitamin D , Ergocalciferol , (DRISDOL ) 1.25 MG (50000 UNIT) CAPS capsule Take 1 capsule (50,000 Units total) by mouth every 7 (seven) days. 5 capsule 12   XARELTO  20 MG TABS tablet TAKE 1 TABLET(20 MG) BY MOUTH DAILY WITH SUPPER 90 tablet 3   No current facility-administered medications on file prior to visit.    BP (!) 140/70   Pulse (!) 56   Temp 97.6 F (36.4 C) (Oral)   Resp 14   Ht 6' 3 (1.905 m)   Wt 298 lb 6.4 oz (135.4 kg)   SpO2 99%   BMI 37.30 kg/m        Objective:   Physical Exam  General Mental Status- Alert. General Appearance- Not in acute distress.   Skin General: Color- Normal Color. Moisture- Normal Moisture.  Neck Carotid Arteries- Normal color. Moisture- Normal Moisture. No carotid bruits. No JVD.  Chest and Lung Exam Auscultation: Breath Sounds:-CTA  Cardiovascular Auscultation:Rythm- Regular. Murmurs & Other Heart Sounds:Auscultation of the heart reveals- No Murmurs.  Abdomen Inspection:-Inspeection Normal. Palpation/Percussion:Note:No mass. Palpation and Percussion of the abdomen reveal- Non Tender, Non Distended + BS, no rebound or guarding.    Neurologic Cranial Nerve exam:- CN III-XII intact(No nystagmus), symmetric smile. Drift Test:- No  drift. Romberg Exam:- Negative.  Finger to Nose:- Normal/Intact Strength:- 5/5  equal and symmetric strength both upper and lower extremities.  On ambulation pt gait is unsteady but this is not new.(Baseline gait)      Assessment & Plan:  Chronic dizziness and imbalance with worsening diplopia and visual disturbance Persistent dizziness and diplopia with recent exacerbation. Differential includes possible chronic conditions vertebral artery insufficiency and myasthenia gravis. 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 Neurologic exam unremarkable. But with recent worse dizziness and worse visual changes will do CT head without contrast out pt since pt refused ED evaluation earlier in the week. - Order CT of the head without contrast stat. - Advise ED visit for severe headache, vomiting, visual loss, or weakness. (generally worse symptoms or new symptoms than presently experiencing) - Update with CT results via MyChart. - Consider MRI if CT negative and symptoms persist. -keep new neurologist appointment April 2026 with hopes can be seen sooner if cancellation occurs. -prior work ups various specialist did not find definitive cause.  Thyroid  eye disease, status post Tepezza  treatment Previously treated with Tepezza . Insurance does not cover additional treatments. Symptoms include worsening diplopia and visual disturbances. Imaging shows eye muscle thickening. - Check insurance coverage for potential future Tepezza  treatment.  Follow up date to be determined after imaging review. Will call or contact you by my chart.   I personally spent a total of 48 minutes in the care of the patient today including getting/reviewing separately obtained history, performing a medically appropriate exam/evaluation, counseling and educating, placing orders, and documenting clinical information in the EHR.

## 2023-12-24 NOTE — Patient Instructions (Signed)
 Chronic dizziness and imbalance with worsening diplopia and visual disturbance Persistent dizziness and diplopia with recent exacerbation. Differential includes possible chronic conditions vertebral artery insufficiency and myasthenia gravis. 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 Neurologic exam unremarkable. But with recent worse dizziness and worse visual changes will do CT head without contrast out pt since pt refused ED evaluation earlier in the week. - Order CT of the head without contrast stat. - Advise ED visit for severe headache, vomiting, visual loss, or weakness. (generally worse symptoms or new symptoms than presently experiencing) - Update with CT results via MyChart. - Consider MRI if CT negative and symptoms persist. -keep new neurologist appointment April 2026 with hopes can be seen sooner if cancellation occurs. -prior work ups various specialist did not find definitive cause.  Thyroid  eye disease, status post Tepezza  treatment Previously treated with Tepezza . Insurance does not cover additional treatments. Symptoms include worsening diplopia and visual disturbances. Imaging shows eye muscle thickening. - Check insurance coverage for potential future Tepezza  treatment.  Follow up date to be determined after imaging review. Will call or contact you by my chart.

## 2023-12-25 ENCOUNTER — Ambulatory Visit: Payer: Self-pay | Admitting: Medical

## 2023-12-30 ENCOUNTER — Other Ambulatory Visit: Payer: Self-pay | Admitting: Medical

## 2023-12-30 DIAGNOSIS — J069 Acute upper respiratory infection, unspecified: Secondary | ICD-10-CM

## 2024-01-10 ENCOUNTER — Other Ambulatory Visit: Payer: Self-pay | Admitting: Cardiology

## 2024-02-02 ENCOUNTER — Other Ambulatory Visit: Payer: Self-pay | Admitting: Medical

## 2024-02-08 ENCOUNTER — Ambulatory Visit: Admitting: Podiatry

## 2024-08-24 ENCOUNTER — Ambulatory Visit
